# Patient Record
Sex: Female | Born: 1985 | Race: Black or African American | Hispanic: No | Marital: Single | Smoking: Current every day smoker
Health system: Southern US, Community
[De-identification: ages and names within clinical notes are randomized; demographics above are authoritative.]

## PROBLEM LIST (undated history)

## (undated) ENCOUNTER — Emergency Department (HOSPITAL_COMMUNITY): Discharge status: Absent without leave | Payer: Medicare Other

## (undated) DIAGNOSIS — IMO0002 Reserved for concepts with insufficient information to code with codable children: Secondary | ICD-10-CM

## (undated) DIAGNOSIS — I272 Pulmonary hypertension, unspecified: Secondary | ICD-10-CM

## (undated) DIAGNOSIS — G43709 Chronic migraine without aura, not intractable, without status migrainosus: Secondary | ICD-10-CM

## (undated) DIAGNOSIS — M779 Enthesopathy, unspecified: Secondary | ICD-10-CM

## (undated) DIAGNOSIS — K219 Gastro-esophageal reflux disease without esophagitis: Secondary | ICD-10-CM

## (undated) DIAGNOSIS — M1990 Unspecified osteoarthritis, unspecified site: Secondary | ICD-10-CM

## (undated) DIAGNOSIS — D571 Sickle-cell disease without crisis: Secondary | ICD-10-CM

## (undated) DIAGNOSIS — Z9049 Acquired absence of other specified parts of digestive tract: Secondary | ICD-10-CM

## (undated) HISTORY — DX: Enthesopathy, unspecified: M77.9

## (undated) HISTORY — DX: Gastro-esophageal reflux disease without esophagitis: K21.9

## (undated) HISTORY — PX: CHOLECYSTECTOMY: SHX55

## (undated) HISTORY — DX: Reserved for concepts with insufficient information to code with codable children: IMO0002

## (undated) HISTORY — PX: DILATION AND CURETTAGE OF UTERUS: SHX78

## (undated) HISTORY — PX: GALLBLADDER SURGERY: SHX652

## (undated) HISTORY — DX: Pulmonary hypertension, unspecified: I27.20

---
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
## 2010-12-08 DIAGNOSIS — IMO0001 Reserved for inherently not codable concepts without codable children: Secondary | ICD-10-CM | POA: Insufficient documentation

## 2012-07-07 DIAGNOSIS — Z720 Tobacco use: Secondary | ICD-10-CM | POA: Insufficient documentation

## 2013-11-20 DIAGNOSIS — Z9049 Acquired absence of other specified parts of digestive tract: Secondary | ICD-10-CM

## 2013-11-20 HISTORY — DX: Acquired absence of other specified parts of digestive tract: Z90.49

## 2016-06-02 DIAGNOSIS — Z7189 Other specified counseling: Secondary | ICD-10-CM | POA: Insufficient documentation

## 2017-03-10 DIAGNOSIS — F39 Unspecified mood [affective] disorder: Secondary | ICD-10-CM | POA: Insufficient documentation

## 2017-04-28 DIAGNOSIS — Z659 Problem related to unspecified psychosocial circumstances: Secondary | ICD-10-CM | POA: Insufficient documentation

## 2017-10-27 ENCOUNTER — Other Ambulatory Visit: Payer: Self-pay

## 2017-10-27 ENCOUNTER — Emergency Department
Admission: EM | Admit: 2017-10-27 | Discharge: 2017-10-27 | Disposition: A | Discharge status: Home / Self Care | Payer: Self-pay | Attending: Emergency Medicine | Admitting: Emergency Medicine

## 2017-10-27 DIAGNOSIS — M79602 Pain in left arm: Secondary | ICD-10-CM | POA: Insufficient documentation

## 2017-10-27 DIAGNOSIS — F17200 Nicotine dependence, unspecified, uncomplicated: Secondary | ICD-10-CM | POA: Insufficient documentation

## 2017-10-27 DIAGNOSIS — D5700 Hb-SS disease with crisis, unspecified: Secondary | ICD-10-CM | POA: Insufficient documentation

## 2017-10-27 LAB — COMPREHENSIVE METABOLIC PANEL
ALBUMIN: 4.6 g/dL (ref 3.5–5.0)
ALT: 16 U/L (ref 14–54)
ANION GAP: 8 (ref 5–15)
AST: 41 U/L (ref 15–41)
Alkaline Phosphatase: 61 U/L (ref 38–126)
BILIRUBIN TOTAL: 5.3 mg/dL — AB (ref 0.3–1.2)
BUN: <5 mg/dL — ABNORMAL LOW (ref 6–20)
CO2: 24 mmol/L (ref 22–32)
Calcium: 9.4 mg/dL (ref 8.9–10.3)
Chloride: 104 mmol/L (ref 101–111)
Creatinine, Ser: 0.62 mg/dL (ref 0.44–1.00)
GFR calc Af Amer: >60 mL/min (ref >60)
Glucose, Bld: 82 mg/dL (ref 65–99)
POTASSIUM: 3.1 mmol/L — AB (ref 3.5–5.1)
Sodium: 136 mmol/L (ref 135–145)
TOTAL PROTEIN: 8.9 g/dL — AB (ref 6.5–8.1)

## 2017-10-27 LAB — CBC WITH DIFFERENTIAL/PLATELET
BASOS ABS: 0.1 10*3/uL (ref 0–0.1)
Basophils Relative: 1 %
Eosinophils Absolute: 0.3 10*3/uL (ref 0–0.7)
Eosinophils Relative: 2 %
HCT: 25.8 % — ABNORMAL LOW (ref 35.0–47.0)
HEMOGLOBIN: 9.4 g/dL — AB (ref 12.0–16.0)
LYMPHS PCT: 41 %
Lymphs Abs: 5.5 10*3/uL — ABNORMAL HIGH (ref 1.0–3.6)
MCH: 36.2 pg — ABNORMAL HIGH (ref 26.0–34.0)
MCHC: 36.3 g/dL — AB (ref 32.0–36.0)
MCV: 99.8 fL (ref 80.0–100.0)
MONOS PCT: 12 %
Monocytes Absolute: 1.6 10*3/uL — ABNORMAL HIGH (ref 0.2–0.9)
Neutro Abs: 5.8 10*3/uL (ref 1.4–6.5)
Neutrophils Relative %: 44 %
Platelets: 330 10*3/uL (ref 150–440)
RBC: 2.58 MIL/uL — AB (ref 3.80–5.20)
RDW: 21.5 % — ABNORMAL HIGH (ref 11.5–14.5)
Smear Review: ADEQUATE
WBC: 13.3 10*3/uL — ABNORMAL HIGH (ref 3.6–11.0)

## 2017-10-27 LAB — RETICULOCYTES
RBC.: 2.58 MIL/uL — AB (ref 3.80–5.20)
RETIC CT PCT: 11.4 % — AB (ref 0.4–3.1)
Retic Count, Absolute: 294.1 10*3/uL — ABNORMAL HIGH (ref 19.0–183.0)

## 2017-10-27 MED ORDER — OXYCODONE HCL 10 MG PO TABS: 10.0000 mg | ORAL_TABLET | Freq: Three times a day (TID) | ORAL | 0 refills | Status: AC | PRN

## 2017-10-27 MED ORDER — OXYCODONE-ACETAMINOPHEN 5-325 MG PO TABS
2.0000 | ORAL_TABLET | Freq: Once | ORAL | Status: AC
  Administered 2017-10-27: 2 via ORAL
  Filled 2017-10-27: qty 2

## 2017-10-27 MED ORDER — ONDANSETRON 4 MG PO TBDP
4.0000 mg | ORAL_TABLET | Freq: Once | ORAL | Status: AC
  Administered 2017-10-27: 4 mg via ORAL
  Filled 2017-10-27: qty 1

## 2017-10-27 MED ORDER — ONDANSETRON HCL 4 MG PO TABS: 4.0000 mg | ORAL_TABLET | Freq: Every day | ORAL | 0 refills | Status: DC | PRN

## 2017-10-27 MED ORDER — HYDROMORPHONE HCL 1 MG/ML IJ SOLN
2.0000 mg | Freq: Once | INTRAMUSCULAR | Status: AC
  Administered 2017-10-27: 2 mg via INTRAMUSCULAR
  Filled 2017-10-27: qty 2

## 2017-10-27 MED ORDER — DEXAMETHASONE 4 MG PO TABS
8.0000 mg | ORAL_TABLET | Freq: Once | ORAL | Status: AC
  Administered 2017-10-27: 8 mg via ORAL
  Filled 2017-10-27 (×2): qty 2

## 2017-10-27 NOTE — ED Triage Notes (Signed)
She arrives today with reports of sickle cell pain - last crisis was in July  She reports that she has been feeling the pain getting worse since Monday after traveling last week   She has been taking BC powders to help relieve the pain

## 2017-10-27 NOTE — Discharge Instructions (Addendum)
Please take your pain medication as needed for severe symptoms and establish care with both primary care and hematology within the next week for reevaluation.  Return to the emergency department sooner for any concerns whatsoever.  It was a pleasure to take care of you today, and thank you for coming to our emergency department.  If you have any questions or concerns before leaving please ask the nurse to grab me and I'm more than happy to go through your aftercare instructions again.  If you were prescribed any opioid pain medication today such as Norco, Vicodin, Percocet, morphine, hydrocodone, or oxycodone please make sure you do not drive when you are taking this medication as it can alter your ability to drive safely.  If you have any concerns once you are home that you are not improving or are in fact getting worse before you can make it to your follow-up appointment, please do not hesitate to call 911 and come back for further evaluation.  Merrily BrittleNeil Halsey Persaud, MD  Results for orders placed or performed during the hospital encounter of 10/27/17  Comprehensive metabolic panel  Result Value Ref Range   Sodium 136 135 - 145 mmol/L   Potassium 3.1 (L) 3.5 - 5.1 mmol/L   Chloride 104 101 - 111 mmol/L   CO2 24 22 - 32 mmol/L   Glucose, Bld 82 65 - 99 mg/dL   BUN <5 (L) 6 - 20 mg/dL   Creatinine, Ser 1.610.62 0.44 - 1.00 mg/dL   Calcium 9.4 8.9 - 09.610.3 mg/dL   Total Protein 8.9 (H) 6.5 - 8.1 g/dL   Albumin 4.6 3.5 - 5.0 g/dL   AST 41 15 - 41 U/L   ALT 16 14 - 54 U/L   Alkaline Phosphatase 61 38 - 126 U/L   Total Bilirubin 5.3 (H) 0.3 - 1.2 mg/dL   GFR calc non Af Amer >60 >60 mL/min   GFR calc Af Amer >60 >60 mL/min   Anion gap 8 5 - 15  CBC with Differential  Result Value Ref Range   WBC 13.3 (H) 3.6 - 11.0 K/uL   RBC 2.58 (L) 3.80 - 5.20 MIL/uL   Hemoglobin 9.4 (L) 12.0 - 16.0 g/dL   HCT 04.525.8 (L) 40.935.0 - 81.147.0 %   MCV 99.8 80.0 - 100.0 fL   MCH 36.2 (H) 26.0 - 34.0 pg   MCHC 36.3 (H) 32.0  - 36.0 g/dL   RDW 91.421.5 (H) 78.211.5 - 95.614.5 %   Platelets 330 150 - 440 K/uL   Neutrophils Relative % 44 %   Lymphocytes Relative 41 %   Monocytes Relative 12 %   Eosinophils Relative 2 %   Basophils Relative 1 %   Neutro Abs 5.8 1.4 - 6.5 K/uL   Lymphs Abs 5.5 (H) 1.0 - 3.6 K/uL   Monocytes Absolute 1.6 (H) 0.2 - 0.9 K/uL   Eosinophils Absolute 0.3 0 - 0.7 K/uL   Basophils Absolute 0.1 0 - 0.1 K/uL   RBC Morphology MARKED POLYCHROMASIA    Smear Review      PLATELET CLUMPS NOTED ON SMEAR, COUNT APPEARS ADEQUATE  Reticulocytes  Result Value Ref Range   Retic Ct Pct 11.4 (H) 0.4 - 3.1 %   RBC. 2.58 (L) 3.80 - 5.20 MIL/uL   Retic Count, Absolute 294.1 (H) 19.0 - 183.0 K/uL

## 2017-10-27 NOTE — ED Notes (Signed)
Pt discgharged. Pt says her pain is back to a 6. Pt is rubbing the end of her nose and her sats are down when she sleeps. Pt asking for more pain meds. Told pt she would not receive more meds. RX given. Pt asking why she didn't get an IV and fluids and her RX filled by the hospital. Told pt that her labs were stable and that we didn't fill RX. Pt states that in [place] because she is on assistance, she doesn't have to pay for RX. I told pt I was unaware of this hospital doing it but possibly a state hospital. Pt verbalized understanding.

## 2017-10-27 NOTE — ED Provider Notes (Signed)
[facility] Emergency Department Provider Note  ____________________________________________   First MD Initiated Contact with Patient 10/27/17 1243     (approximate)  I have reviewed the triage vital signs and the nursing notes.   HISTORY  Chief Complaint Sickle Cell Pain Crisis   HPI Kara [name] is a 31 y.o. female who self presents to the emergency department with 4-5 days of moderate to severe aching in her elbows wrists hands and legs.  She says this feels identical to previous sickle cell pain crises.  She has had no fevers or chills.  No chest pain or shortness of breath.  She has been compliant with her folic acid, however she is out of all opioid pain medications.  She has been taking BC powder with minimal relief.  Her pain had insidious in onset and has been constant ever since.  It seems to be worsened with movement and improves slightly with pain medication.  [place] recently [place] from [place] and has yet to establish care with a primary care physician or hematologist here in [place].  No past medical history on file.  There are no active problems to display for this patient.     Prior to Admission medications   Medication Sig Start Date End Date Taking? Authorizing Provider  ondansetron (ZOFRAN) 4 MG tablet Take 1 tablet (4 mg total) by mouth daily as needed. 10/27/17 10/27/18  Merrily Brittleifenbark, Deandre Brannan, MD  Oxycodone HCl 10 MG TABS Take 1 tablet (10 mg total) by mouth every 8 (eight) hours as needed. 10/27/17 11/25/17  Merrily Brittleifenbark, Sharran Caratachea, MD    Allergies Kiwi extract; Morphine and related; and Paroxetine  No family history on file.  Social History Social History   Tobacco Use  . Smoking status: Current Every Day Smoker  . Smokeless tobacco: Never Used  Substance Use Topics  . Alcohol use: No    Frequency: Never  . Drug use: Not on file    Review of Systems Constitutional: No fever/chills Eyes: No visual changes. ENT: No  sore throat. Cardiovascular: Denies chest pain. Respiratory: Denies shortness of breath. Gastrointestinal: No abdominal pain.  No nausea, no vomiting.  No diarrhea.  No constipation. Genitourinary: Negative for dysuria. Musculoskeletal: Positive for back pain. Skin: Negative for rash. Neurological: Negative for headaches, focal weakness or numbness.   ____________________________________________   PHYSICAL EXAM:  VITAL SIGNS: ED Triage Vitals  Enc Vitals Group     BP 10/27/17 1145 127/68     Pulse Rate 10/27/17 1145 91     Resp 10/27/17 1145 16     Temp 10/27/17 1145 98.7 F (37.1 C)     Temp Source 10/27/17 1145 Oral     SpO2 10/27/17 1145 98 %     Weight 10/27/17 1146 115 lb (52.2 kg)     Height 10/27/17 1146 5\' 4"  (1.626 m)     Head Circumference --      Peak Flow --      Pain Score 10/27/17 1145 7     Pain Loc --      Pain Edu? --      Excl. in GC? --     Constitutional: Alert and oriented x4 very well-appearing texting when I walk into the room in no acute distress Eyes: PERRL EOMI. Head: Atraumatic. Nose: No congestion/rhinnorhea. Mouth/Throat: No trismus Neck: No stridor.   Cardiovascular: Normal rate, regular rhythm. Grossly normal heart sounds.  Good peripheral circulation. Respiratory: Normal respiratory effort.  No retractions. Lungs CTAB  and moving good air Gastrointestinal: Soft nontender Musculoskeletal: No lower extremity edema   Neurologic:  Normal speech and language. No gross focal neurologic deficits are appreciated. Skin:  Skin is warm, dry and intact. No rash noted. Psychiatric: Mood and affect are normal. Speech and behavior are normal.    ____________________________________________   DIFFERENTIAL includes but not limited to  Sickle cell pain crisis, acute chest syndrome, sequestration crisis ____________________________________________   LABS (all labs ordered are listed, but only abnormal results are displayed)  Labs Reviewed    COMPREHENSIVE METABOLIC PANEL - Abnormal; Notable for the following components:      Result Value   Potassium 3.1 (*)    BUN <5 (*)    Total Protein 8.9 (*)    Total Bilirubin 5.3 (*)    All other components within normal limits  CBC WITH DIFFERENTIAL/PLATELET - Abnormal; Notable for the following components:   WBC 13.3 (*)    RBC 2.58 (*)    Hemoglobin 9.4 (*)    HCT 25.8 (*)    MCH 36.2 (*)    MCHC 36.3 (*)    RDW 21.5 (*)    Lymphs Abs 5.5 (*)    Monocytes Absolute 1.6 (*)    All other components within normal limits  RETICULOCYTES - Abnormal; Notable for the following components:   Retic Ct Pct 11.4 (*)    RBC. 2.58 (*)    Retic Count, Absolute 294.1 (*)    All other components within normal limits  POC URINE PREG, ED    Blood work reviewed by me shows hemoglobin of 9.4 with appropriate reticulocytes.  Elevated white count is nonspecific and likely secondary to stress __________________________________________  EKG   ____________________________________________  RADIOLOGY   ____________________________________________   PROCEDURES  Procedure(s) performed: no  Procedures  Critical Care performed: no  Observation: no ____________________________________________   INITIAL IMPRESSION / ASSESSMENT AND PLAN / ED COURSE  Pertinent labs & imaging results that were available during my care of the patient were reviewed by me and considered in my medical decision making (see chart for details).  On arrival the patient is quite well-appearing, although with a history of sickle cell disease and reported pain which is concerning.  She has no chest pain and no shortness of breath whatsoever.  I had a lengthy discussion with the patient regarding IV fluids and IV pain medication versus oral and she is opted for oral treatment as well as an intramuscular dose of Dilaudid now which I think is reasonable.  ----------------------------------------- 3:22 PM on  10/27/2017 -----------------------------------------  After 2 mg of intramuscular Dilaudid and 10 mg of Percocet the patient's pain is not gone but it is significantly improved.  She is able to eat and drink without difficulty.  At this point I will help her establish care with hematology [place] and give her Percocet for breakthrough pain.  She is discharged home in improved condition verbalizes understanding and agreement the plan.      ____________________________________________   FINAL CLINICAL IMPRESSION(S) / ED DIAGNOSES  Final diagnoses:  Sickle cell pain crisis (HCC)      NEW MEDICATIONS STARTED DURING THIS VISIT:  This SmartLink is deprecated. Use AVSMEDLIST instead to display the medication list for a patient.   Note:  This document was prepared using Dragon voice recognition software and may include unintentional dictation errors.     Merrily Brittleifenbark, Cosimo Schertzer, MD 10/27/17 [phone]

## 2017-10-27 NOTE — ED Notes (Signed)
Pt given sandwich tray and ginger ale. Pt states that she is now itching and that she is unable to take benadryl and needs steroids for itching. Pt also states that she is still having pain, has improved since she first got here but she is still hurting. Pt also requesting to know if we are going to start an IV. Dr. Lamont Snowballifenbark informed and will update patient.

## 2017-10-29 ENCOUNTER — Emergency Department: Payer: Self-pay

## 2017-10-29 ENCOUNTER — Encounter: Payer: Self-pay | Admitting: Emergency Medicine

## 2017-10-29 ENCOUNTER — Other Ambulatory Visit: Payer: Self-pay

## 2017-10-29 ENCOUNTER — Inpatient Hospital Stay
Admission: EM | Admit: 2017-10-29 | Discharge: 2017-10-29 | DRG: 812 | Discharge status: Against Medical Advice | Payer: Self-pay | Attending: Internal Medicine | Admitting: Internal Medicine

## 2017-10-29 DIAGNOSIS — D72829 Elevated white blood cell count, unspecified: Secondary | ICD-10-CM

## 2017-10-29 DIAGNOSIS — D5700 Hb-SS disease with crisis, unspecified: Principal | ICD-10-CM | POA: Diagnosis present

## 2017-10-29 DIAGNOSIS — F17210 Nicotine dependence, cigarettes, uncomplicated: Secondary | ICD-10-CM | POA: Diagnosis present

## 2017-10-29 DIAGNOSIS — Z9114 Patient's other noncompliance with medication regimen: Secondary | ICD-10-CM

## 2017-10-29 HISTORY — DX: Sickle-cell disease without crisis: D57.1

## 2017-10-29 HISTORY — DX: Unspecified osteoarthritis, unspecified site: M19.90

## 2017-10-29 LAB — CBC WITH DIFFERENTIAL/PLATELET
Band Neutrophils: 0 %
Basophils Absolute: 0 K/uL (ref 0–0.1)
Basophils Relative: 0 %
Blasts: 0 %
Eosinophils Absolute: 0 K/uL (ref 0–0.7)
Eosinophils Relative: 0 %
HCT: 22.5 % — ABNORMAL LOW (ref 35.0–47.0)
Hemoglobin: 7.9 g/dL — ABNORMAL LOW (ref 12.0–16.0)
Lymphocytes Relative: 43 %
Lymphs Abs: 9.8 K/uL — ABNORMAL HIGH (ref 1.0–3.6)
MCH: 36.4 pg — ABNORMAL HIGH (ref 26.0–34.0)
MCHC: 35.3 g/dL (ref 32.0–36.0)
MCV: 103 fL — ABNORMAL HIGH (ref 80.0–100.0)
Metamyelocytes Relative: 0 %
Monocytes Absolute: 1.4 K/uL — ABNORMAL HIGH (ref 0.2–0.9)
Monocytes Relative: 6 %
Myelocytes: 0 %
Neutro Abs: 11.6 K/uL — ABNORMAL HIGH (ref 1.4–6.5)
Neutrophils Relative %: 51 %
Other: 0 %
Platelets: 316 K/uL (ref 150–440)
Promyelocytes Absolute: 0 %
RBC: 2.18 MIL/uL — ABNORMAL LOW (ref 3.80–5.20)
RDW: 20.9 % — ABNORMAL HIGH (ref 11.5–14.5)
WBC: 22.8 K/uL — ABNORMAL HIGH (ref 3.6–11.0)
nRBC: 5 /100{WBCs} — ABNORMAL HIGH

## 2017-10-29 LAB — RETICULOCYTES
RBC.: 2.18 MIL/uL — AB (ref 3.80–5.20)
RETIC COUNT ABSOLUTE: 320.5 10*3/uL — AB (ref 19.0–183.0)
RETIC CT PCT: 14.7 % — AB (ref 0.4–3.1)

## 2017-10-29 LAB — COMPREHENSIVE METABOLIC PANEL WITH GFR
ALT: 14 U/L (ref 14–54)
AST: 39 U/L (ref 15–41)
Albumin: 3.9 g/dL (ref 3.5–5.0)
Alkaline Phosphatase: 52 U/L (ref 38–126)
Anion gap: 8 (ref 5–15)
BUN: 7 mg/dL (ref 6–20)
CO2: 25 mmol/L (ref 22–32)
Calcium: 8.9 mg/dL (ref 8.9–10.3)
Chloride: 105 mmol/L (ref 101–111)
Creatinine, Ser: 0.53 mg/dL (ref 0.44–1.00)
GFR calc Af Amer: >60 mL/min (ref >60)
GFR calc non Af Amer: >60 mL/min (ref >60)
Glucose, Bld: 102 mg/dL — ABNORMAL HIGH (ref 65–99)
Potassium: 3.5 mmol/L (ref 3.5–5.1)
Sodium: 138 mmol/L (ref 135–145)
Total Bilirubin: 4.4 mg/dL — ABNORMAL HIGH (ref 0.3–1.2)
Total Protein: 7.4 g/dL (ref 6.5–8.1)

## 2017-10-29 LAB — LACTIC ACID, PLASMA: Lactic Acid, Venous: 1.7 mmol/L (ref 0.5–1.9)

## 2017-10-29 MED ORDER — HYDROMORPHONE HCL 1 MG/ML IJ SOLN: 1.0000 mg | Freq: Once | INTRAMUSCULAR | Status: DC

## 2017-10-29 MED ORDER — KETOROLAC TROMETHAMINE 60 MG/2ML IM SOLN
60.0000 mg | Freq: Once | INTRAMUSCULAR | Status: AC
  Administered 2017-10-29: 60 mg via INTRAMUSCULAR
  Filled 2017-10-29: qty 2

## 2017-10-29 MED ORDER — SODIUM CHLORIDE 0.9 % IV BOLUS (SEPSIS)
1000.0000 mL | Freq: Once | INTRAVENOUS | Status: AC
  Administered 2017-10-29: 1000 mL via INTRAVENOUS

## 2017-10-29 MED ORDER — CEFTRIAXONE SODIUM IN DEXTROSE 20 MG/ML IV SOLN
1.0000 g | Freq: Once | INTRAVENOUS | Status: DC
  Filled 2017-10-29: qty 50

## 2017-10-29 NOTE — ED Triage Notes (Signed)
Pt here for sickle cell crisis. Here for same Friday. Pain mostly to back and right elbow.  Pain not getting any better.  Denies NVD.

## 2017-10-29 NOTE — ED Notes (Signed)
Pt refused rocephin and requested fluids. MD notified and fluids hung

## 2017-10-29 NOTE — ED Notes (Signed)
Pt encouraged to stay and be transferred to [facility] per Dr. Mayford KnifeWilliams advice but pt states "I am going to seek care elsewhere". Pt encouraged to return with any problems, refuses to sign AMA form, complications of refusing transfer and medical care reviewed with patient.

## 2017-10-29 NOTE — ED Notes (Signed)
Pt up to the restroom. Pt was walking out of room when RN saw pt. Pt was leaning in the doorframe and hunched over, RN took pt straight to bathroom and was not able to get urine cup to catch specimen. Will attempt to cath urine sample next time. Pt was taken back to bed using a wheelchair. Pt did not fall and did not sustain injury. Pt instructed by RN to use call bell next time to call for assistance.

## 2017-10-29 NOTE — ED Notes (Signed)
Pt given water and informed of needing urine sample

## 2017-10-29 NOTE — ED Notes (Signed)
98.8 rechecked temp

## 2017-10-29 NOTE — ED Notes (Addendum)
Pt pulled out IV and is now refusing to be transferred to [facility]. Pt dressed and is refusing to sign AMA form with Raquel. [employer] officer outside of room. Pt leaving at this time

## 2017-10-29 NOTE — ED Notes (Signed)
Pt requesting medical records. MD will be notified

## 2017-10-29 NOTE — ED Provider Notes (Signed)
Also to note, patient was video recording me against my wishes as I was trying to discuss her care.  We have reiterated the hospital policy regarding video surveillance.   Emily FilbertWilliams, Kennard Fildes E, MD 10/29/17 [phone]

## 2017-10-29 NOTE — ED Provider Notes (Signed)
I have informed the patient that she was accepted in transfer to [facility] but she has refused all treatment and transport despite my attempts to convey that she may have a serious infection concurrently. She repeatedly refuses further care.   Emily FilbertWilliams, Airam Heidecker E, MD 10/29/17 2056

## 2017-10-29 NOTE — ED Provider Notes (Signed)
[facility] Emergency Department Provider Note       Time seen: ----------------------------------------- 5:54 PM on 10/29/2017 -----------------------------------------   I have reviewed the triage vital signs and the nursing notes.  HISTORY   Chief Complaint Sickle Cell Pain Crisis   HPI Kara [name] is a 31 y.o. female with a history of sickle cell who presents to the ED for diffuse pain.  Patient discussed pain in her back and legs which is typical for her sickle cell pain crisis.  She states she had the same symptoms on Friday.  Pain is mostly in her back and in her right elbow.  She reports the Percocet she was given the other day has not helped her pain in knee.  She denies any other symptoms at this time.  Past Medical History:  Diagnosis Date  . Arthritis   . Sickle cell anemia (HCC)     There are no active problems to display for this patient.   History reviewed. No pertinent surgical history.  Allergies Kiwi extract; Morphine and related; and Paroxetine  Social History Social History   Tobacco Use  . Smoking status: Current Every Day Smoker  . Smokeless tobacco: Never Used  Substance Use Topics  . Alcohol use: No    Frequency: Never  . Drug use: Not on file    Review of Systems Constitutional: Negative for fever. Eyes: Negative for vision changes ENT:  Negative for congestion, sore throat Cardiovascular: Negative for chest pain. Respiratory: Negative for shortness of breath. Gastrointestinal: Negative for abdominal pain, vomiting and diarrhea. Genitourinary: Negative for dysuria. Musculoskeletal: Positive for back pain, right elbow pain Skin: Negative for rash. Neurological: Negative for headaches, focal weakness or numbness.  All systems negative/normal/unremarkable except as stated in the HPI  ____________________________________________   PHYSICAL EXAM:  VITAL SIGNS: ED Triage Vitals  Enc Vitals Group     BP  10/29/17 1713 119/79     Pulse Rate 10/29/17 1713 (!) 115     Resp 10/29/17 1713 (!) 26     Temp 10/29/17 1713 99.9 F (37.7 C)     Temp Source 10/29/17 1713 Oral     SpO2 10/29/17 1713 92 %     Weight 10/29/17 1714 115 lb (52.2 kg)     Height 10/29/17 1714 5\' 4"  (1.626 m)     Head Circumference --      Peak Flow --      Pain Score 10/29/17 1713 10     Pain Loc --      Pain Edu? --      Excl. in GC? --    Constitutional: Alert and oriented. Well appearing and in no distress. Eyes: Conjunctivae are normal. Normal extraocular movements. ENT   Head: Normocephalic and atraumatic.   Nose: No congestion/rhinnorhea.   Mouth/Throat: Mucous membranes are moist.   Neck: No stridor. Cardiovascular: Normal rate, regular rhythm. No murmurs, rubs, or gallops. Respiratory: Normal respiratory effort without tachypnea nor retractions. Breath sounds are clear and equal bilaterally. No wheezes/rales/rhonchi. Gastrointestinal: Soft and nontender. Normal bowel sounds Musculoskeletal: Nontender with normal range of motion in extremities. No lower extremity tenderness nor edema. Neurologic:  Normal speech and language. No gross focal neurologic deficits are appreciated.  Skin:  Skin is warm, dry and intact. No rash noted. Psychiatric: Mood and affect are normal. Speech and behavior are normal.  ____________________________________________  ED COURSE:  Pertinent labs & imaging results that were available during my care of the patient were reviewed by me  and considered in my medical decision making (see chart for details). Patient presents for sickle cell pain crisis, we will assess with labs and imaging as indicated. Clinical Course as of Oct 29 2025  Mon Oct 29, 2017  1806 Patient has been demeaning and verbally abusive toward staff throughout her stay here.  She refuses to apologize to a nurse who was crying after the verbal abuse received while attempting to place an IV on her.  [JW]   1857 Patient has refused blood cultures  [JW]  1912 Patient refused to use the toilet in the room to provide a urine sample  [JW]    Clinical Course User Index [JW] Emily FilbertWilliams, Jonathan E, MD   Procedures ____________________________________________   LABS (pertinent positives/negatives)  Labs Reviewed  CBC WITH DIFFERENTIAL/PLATELET - Abnormal; Notable for the following components:      Result Value   WBC 22.8 (*)    RBC 2.18 (*)    Hemoglobin 7.9 (*)    HCT 22.5 (*)    MCV 103.0 (*)    MCH 36.4 (*)    RDW 20.9 (*)    nRBC 5 (*)    Neutro Abs 11.6 (*)    Lymphs Abs 9.8 (*)    Monocytes Absolute 1.4 (*)    All other components within normal limits  COMPREHENSIVE METABOLIC PANEL - Abnormal; Notable for the following components:   Glucose, Bld 102 (*)    Total Bilirubin 4.4 (*)    All other components within normal limits  RETICULOCYTES - Abnormal; Notable for the following components:   Retic Ct Pct 14.7 (*)    RBC. 2.18 (*)    Retic Count, Absolute 320.5 (*)    All other components within normal limits  CULTURE, BLOOD (ROUTINE X 2)  CULTURE, BLOOD (ROUTINE X 2)  LACTIC ACID, PLASMA  URINALYSIS, COMPLETE (UACMP) WITH MICROSCOPIC  LACTIC ACID, PLASMA    RADIOLOGY Images were viewed by me  Chest x-ray IMPRESSION: No confluent consolidation. Normal vasculature. Moderate cardiomegaly. Linear scarring or atelectasis in the bases, accentuated by a shallow inspiration. ____________________________________________  DIFFERENTIAL DIAGNOSIS   Sickle cell pain crisis, occult infection, medication noncompliance,  FINAL ASSESSMENT AND PLAN  Sickle cell disease, leukocytosis   Plan: Patient had presented for diffuse pain with sickle cell disease that is known. Patient's labs were concerning for infection and possibly bacteremia although lactic acid level is negative. Patient's imaging not reveal any acute process.  I have discussed with infectious disease here who is  recommended IV Rocephin.  I will discuss [facility] for admission.   Emily FilbertWilliams, Jonathan E, MD   Note: This note was generated in part or whole with voice recognition software. Voice recognition is usually quite accurate but there are transcription errors that can and very often do occur. I apologize for any typographical errors that were not detected and corrected.     Emily FilbertWilliams, Jonathan E, MD 10/29/17 2040

## 2017-11-03 LAB — CULTURE, BLOOD (ROUTINE X 2)
Culture: NO GROWTH
Culture: NO GROWTH
Special Requests: ADEQUATE
Special Requests: ADEQUATE

## 2018-02-05 ENCOUNTER — Encounter: Payer: Self-pay | Admitting: Emergency Medicine

## 2018-02-05 ENCOUNTER — Other Ambulatory Visit: Payer: Self-pay

## 2018-02-05 ENCOUNTER — Inpatient Hospital Stay
Admission: EM | Admit: 2018-02-05 | Discharge: 2018-02-05 | DRG: 812 | Disposition: A | Discharge status: Other Acute Inpatient Hospital | Payer: Self-pay | Attending: Internal Medicine | Admitting: Internal Medicine

## 2018-02-05 DIAGNOSIS — D5700 Hb-SS disease with crisis, unspecified: Principal | ICD-10-CM | POA: Diagnosis present

## 2018-02-05 DIAGNOSIS — R21 Rash and other nonspecific skin eruption: Secondary | ICD-10-CM | POA: Diagnosis present

## 2018-02-05 DIAGNOSIS — W57XXXA Bitten or stung by nonvenomous insect and other nonvenomous arthropods, initial encounter: Secondary | ICD-10-CM | POA: Diagnosis present

## 2018-02-05 LAB — COMPREHENSIVE METABOLIC PANEL
ALT: 17 U/L (ref 14–54)
AST: 39 U/L (ref 15–41)
Albumin: 4.3 g/dL (ref 3.5–5.0)
Alkaline Phosphatase: 60 U/L (ref 38–126)
Anion gap: 8 (ref 5–15)
BUN: 5 mg/dL — AB (ref 6–20)
CHLORIDE: 107 mmol/L (ref 101–111)
CO2: 22 mmol/L (ref 22–32)
CREATININE: 0.62 mg/dL (ref 0.44–1.00)
Calcium: 9 mg/dL (ref 8.9–10.3)
Glucose, Bld: 99 mg/dL (ref 65–99)
POTASSIUM: 3.5 mmol/L (ref 3.5–5.1)
Sodium: 137 mmol/L (ref 135–145)
Total Bilirubin: 4.8 mg/dL — ABNORMAL HIGH (ref 0.3–1.2)
Total Protein: 8.4 g/dL — ABNORMAL HIGH (ref 6.5–8.1)

## 2018-02-05 LAB — CBC WITH DIFFERENTIAL/PLATELET
BASOS ABS: 0.2 10*3/uL — AB (ref 0–0.1)
BASOS PCT: 1 %
EOS PCT: 2 %
Eosinophils Absolute: 0.4 10*3/uL (ref 0–0.7)
HEMATOCRIT: 23.1 % — AB (ref 35.0–47.0)
HEMOGLOBIN: 8.2 g/dL — AB (ref 12.0–16.0)
LYMPHS ABS: 6.9 10*3/uL — AB (ref 1.0–3.6)
Lymphocytes Relative: 39 %
MCH: 35.9 pg — ABNORMAL HIGH (ref 26.0–34.0)
MCHC: 35.5 g/dL (ref 32.0–36.0)
MCV: 101.1 fL — ABNORMAL HIGH (ref 80.0–100.0)
Monocytes Absolute: 1.6 10*3/uL — ABNORMAL HIGH (ref 0.2–0.9)
Monocytes Relative: 9 %
NEUTROS PCT: 49 %
Neutro Abs: 8.7 10*3/uL — ABNORMAL HIGH (ref 1.4–6.5)
Platelets: 319 10*3/uL (ref 150–440)
RBC: 2.29 MIL/uL — AB (ref 3.80–5.20)
RDW: 21.7 % — AB (ref 11.5–14.5)
WBC: 17.8 10*3/uL — AB (ref 3.6–11.0)
nRBC: 3 /100 WBC — ABNORMAL HIGH

## 2018-02-05 LAB — URINALYSIS, COMPLETE (UACMP) WITH MICROSCOPIC
BILIRUBIN URINE: NEGATIVE
Bacteria, UA: NONE SEEN
GLUCOSE, UA: NEGATIVE mg/dL
HGB URINE DIPSTICK: NEGATIVE
KETONES UR: NEGATIVE mg/dL
LEUKOCYTES UA: NEGATIVE
NITRITE: NEGATIVE
PH: 6 (ref 5.0–8.0)
Protein, ur: NEGATIVE mg/dL
SPECIFIC GRAVITY, URINE: 1.011 (ref 1.005–1.030)

## 2018-02-05 LAB — RETICULOCYTES
RBC.: 2.29 MIL/uL — AB (ref 3.80–5.20)
RETIC COUNT ABSOLUTE: 274.8 10*3/uL — AB (ref 19.0–183.0)
Retic Ct Pct: 12 % — ABNORMAL HIGH (ref 0.4–3.1)

## 2018-02-05 LAB — POCT PREGNANCY, URINE: Preg Test, Ur: NEGATIVE

## 2018-02-05 MED ORDER — HYDROMORPHONE HCL 1 MG/ML IJ SOLN
1.0000 mg | Freq: Once | INTRAMUSCULAR | Status: AC
  Administered 2018-02-05: 1 mg via INTRAVENOUS
  Filled 2018-02-05: qty 1

## 2018-02-05 MED ORDER — METHYLPREDNISOLONE SODIUM SUCC 125 MG IJ SOLR
60.0000 mg | Freq: Once | INTRAMUSCULAR | Status: AC
  Administered 2018-02-05: 60 mg via INTRAVENOUS
  Filled 2018-02-05: qty 2

## 2018-02-05 MED ORDER — ONDANSETRON HCL 4 MG/2ML IJ SOLN
INTRAMUSCULAR | Status: AC
  Administered 2018-02-05: 4 mg via INTRAVENOUS
  Filled 2018-02-05: qty 2

## 2018-02-05 MED ORDER — DIPHENHYDRAMINE HCL 25 MG PO CAPS
50.0000 mg | ORAL_CAPSULE | Freq: Once | ORAL | Status: AC
  Administered 2018-02-05: 50 mg via ORAL
  Filled 2018-02-05: qty 2

## 2018-02-05 MED ORDER — SODIUM CHLORIDE 0.9 % IV BOLUS (SEPSIS)
1000.0000 mL | Freq: Once | INTRAVENOUS | Status: AC
  Administered 2018-02-05: 1000 mL via INTRAVENOUS

## 2018-02-05 MED ORDER — HYDROMORPHONE HCL 1 MG/ML IJ SOLN
2.0000 mg | Freq: Once | INTRAMUSCULAR | Status: AC
  Administered 2018-02-05: 2 mg via INTRAVENOUS
  Filled 2018-02-05: qty 2

## 2018-02-05 MED ORDER — ONDANSETRON HCL 4 MG/2ML IJ SOLN
4.0000 mg | Freq: Once | INTRAMUSCULAR | Status: AC
  Administered 2018-02-05: 4 mg via INTRAVENOUS
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
## 2018-02-05 NOTE — ED Notes (Signed)
[facility]

## 2018-02-05 NOTE — ED Notes (Signed)
EMTALA checked for completion  

## 2018-02-05 NOTE — ED Notes (Signed)
O2 at 2L per pts comfort

## 2018-02-05 NOTE — ED Triage Notes (Signed)
Pt to ED via POV c/o sickle cell pain crisis and possible bed bug bites. Pt states that for the past week she has been having pain. Pt in NAD in triage.

## 2018-02-06 ENCOUNTER — Encounter (HOSPITAL_COMMUNITY): Payer: Self-pay

## 2018-02-06 ENCOUNTER — Inpatient Hospital Stay (HOSPITAL_COMMUNITY)
Admission: EM | Admit: 2018-02-06 | Discharge: 2018-02-09 | DRG: 812 | Disposition: A | Discharge status: Home / Self Care | Payer: Self-pay | Source: Other Acute Inpatient Hospital | Attending: Internal Medicine | Admitting: Internal Medicine

## 2018-02-06 DIAGNOSIS — Z590 Homelessness: Secondary | ICD-10-CM

## 2018-02-06 DIAGNOSIS — Z9102 Food additives allergy status: Secondary | ICD-10-CM

## 2018-02-06 DIAGNOSIS — Z888 Allergy status to other drugs, medicaments and biological substances status: Secondary | ICD-10-CM

## 2018-02-06 DIAGNOSIS — D638 Anemia in other chronic diseases classified elsewhere: Secondary | ICD-10-CM | POA: Diagnosis present

## 2018-02-06 DIAGNOSIS — M1990 Unspecified osteoarthritis, unspecified site: Secondary | ICD-10-CM | POA: Diagnosis present

## 2018-02-06 DIAGNOSIS — D72829 Elevated white blood cell count, unspecified: Secondary | ICD-10-CM | POA: Diagnosis present

## 2018-02-06 DIAGNOSIS — B851 Pediculosis due to Pediculus humanus corporis: Secondary | ICD-10-CM | POA: Diagnosis present

## 2018-02-06 DIAGNOSIS — F17200 Nicotine dependence, unspecified, uncomplicated: Secondary | ICD-10-CM | POA: Diagnosis present

## 2018-02-06 DIAGNOSIS — Z789 Other specified health status: Secondary | ICD-10-CM

## 2018-02-06 DIAGNOSIS — R071 Chest pain on breathing: Secondary | ICD-10-CM

## 2018-02-06 DIAGNOSIS — Z885 Allergy status to narcotic agent status: Secondary | ICD-10-CM

## 2018-02-06 DIAGNOSIS — D5700 Hb-SS disease with crisis, unspecified: Principal | ICD-10-CM

## 2018-02-06 LAB — CBC WITH DIFFERENTIAL/PLATELET
BASOS ABS: 0 10*3/uL (ref 0.0–0.1)
Basophils Relative: 0 %
Eosinophils Absolute: 0 10*3/uL (ref 0.0–0.7)
Eosinophils Relative: 0 %
HEMATOCRIT: 20.1 % — AB (ref 36.0–46.0)
HEMOGLOBIN: 7.2 g/dL — AB (ref 12.0–15.0)
LYMPHS PCT: 20 %
Lymphs Abs: 2.8 10*3/uL (ref 0.7–4.0)
MCH: 34.8 pg — ABNORMAL HIGH (ref 26.0–34.0)
MCHC: 35.8 g/dL (ref 30.0–36.0)
MCV: 97.1 fL (ref 78.0–100.0)
Monocytes Absolute: 0.1 10*3/uL (ref 0.1–1.0)
Monocytes Relative: 1 %
NEUTROS ABS: 11.2 10*3/uL (ref 1.7–7.7)
NEUTROS PCT: 79 %
Platelets: 320 10*3/uL (ref 150–400)
RBC: 2.07 MIL/uL — AB (ref 3.87–5.11)
RDW: 21.8 % — ABNORMAL HIGH (ref 11.5–15.5)
WBC: 14.1 10*3/uL — ABNORMAL HIGH (ref 4.0–10.5)

## 2018-02-06 LAB — COMPREHENSIVE METABOLIC PANEL
ALT: 14 U/L (ref 14–54)
AST: 33 U/L (ref 15–41)
Albumin: 4.1 g/dL (ref 3.5–5.0)
Alkaline Phosphatase: 62 U/L (ref 38–126)
Anion gap: 9 (ref 5–15)
BILIRUBIN TOTAL: 4.8 mg/dL — AB (ref 0.3–1.2)
BUN: <5 mg/dL — ABNORMAL LOW (ref 6–20)
CALCIUM: 8.8 mg/dL — AB (ref 8.9–10.3)
CO2: 22 mmol/L (ref 22–32)
Chloride: 105 mmol/L (ref 101–111)
Creatinine, Ser: 0.43 mg/dL — ABNORMAL LOW (ref 0.44–1.00)
GFR calc non Af Amer: >60 mL/min (ref >60)
Glucose, Bld: 157 mg/dL — ABNORMAL HIGH (ref 65–99)
Potassium: 3.6 mmol/L (ref 3.5–5.1)
Sodium: 136 mmol/L (ref 135–145)
TOTAL PROTEIN: 8 g/dL (ref 6.5–8.1)

## 2018-02-06 LAB — HIV ANTIBODY (ROUTINE TESTING W REFLEX): HIV Screen 4th Generation wRfx: NONREACTIVE

## 2018-02-06 MED ORDER — HYDROMORPHONE 1 MG/ML IV SOLN
INTRAVENOUS | Status: DC
  Administered 2018-02-06: 3.5 mg via INTRAVENOUS
  Administered 2018-02-06: 2 mg via INTRAVENOUS
  Administered 2018-02-06: 2.5 mg via INTRAVENOUS
  Administered 2018-02-06: 02:00:00 via INTRAVENOUS
  Administered 2018-02-06 – 2018-02-07 (×2): 1.5 mg via INTRAVENOUS
  Administered 2018-02-07 (×2): 2 mg via INTRAVENOUS
  Administered 2018-02-07 (×2): 1.5 mg via INTRAVENOUS
  Administered 2018-02-08: 0 mg via INTRAVENOUS
  Filled 2018-02-06: qty 25

## 2018-02-06 MED ORDER — SENNOSIDES-DOCUSATE SODIUM 8.6-50 MG PO TABS
1.0000 | ORAL_TABLET | Freq: Two times a day (BID) | ORAL | Status: DC
  Administered 2018-02-06 – 2018-02-07 (×4): 1 via ORAL
  Filled 2018-02-06 (×5): qty 1

## 2018-02-06 MED ORDER — ENOXAPARIN SODIUM 40 MG/0.4ML ~~LOC~~ SOLN
40.0000 mg | SUBCUTANEOUS | Status: DC
  Administered 2018-02-06 – 2018-02-07 (×2): 40 mg via SUBCUTANEOUS
  Filled 2018-02-06 (×3): qty 0.4

## 2018-02-06 MED ORDER — SODIUM CHLORIDE 0.9% FLUSH: 9.0000 mL | INTRAVENOUS | Status: DC | PRN

## 2018-02-06 MED ORDER — DIPHENHYDRAMINE HCL 50 MG/ML IJ SOLN
12.5000 mg | Freq: Four times a day (QID) | INTRAMUSCULAR | Status: DC | PRN
  Filled 2018-02-06: qty 1

## 2018-02-06 MED ORDER — POLYETHYLENE GLYCOL 3350 17 G PO PACK
17.0000 g | PACK | Freq: Every day | ORAL | Status: DC | PRN
  Administered 2018-02-07: 17 g via ORAL
  Filled 2018-02-06: qty 1

## 2018-02-06 MED ORDER — KETOROLAC TROMETHAMINE 30 MG/ML IJ SOLN
30.0000 mg | Freq: Four times a day (QID) | INTRAMUSCULAR | Status: DC
  Administered 2018-02-06 – 2018-02-09 (×10): 30 mg via INTRAVENOUS
  Filled 2018-02-06 (×11): qty 1

## 2018-02-06 MED ORDER — NALOXONE HCL 0.4 MG/ML IJ SOLN: 0.4000 mg | INTRAMUSCULAR | Status: DC | PRN

## 2018-02-06 MED ORDER — DIPHENHYDRAMINE HCL 12.5 MG/5ML PO ELIX: 12.5000 mg | ORAL_SOLUTION | Freq: Four times a day (QID) | ORAL | Status: DC | PRN

## 2018-02-06 MED ORDER — ENSURE ENLIVE PO LIQD
237.0000 mL | Freq: Two times a day (BID) | ORAL | Status: DC
  Administered 2018-02-06 – 2018-02-09 (×4): 237 mL via ORAL

## 2018-02-06 MED ORDER — DEXTROSE-NACL 5-0.45 % IV SOLN
INTRAVENOUS | Status: DC
  Administered 2018-02-06 – 2018-02-07 (×3): via INTRAVENOUS

## 2018-02-06 MED ORDER — ONDANSETRON HCL 4 MG/2ML IJ SOLN
4.0000 mg | Freq: Four times a day (QID) | INTRAMUSCULAR | Status: DC | PRN
  Filled 2018-02-06: qty 2
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
## 2018-02-07 DIAGNOSIS — D72829 Elevated white blood cell count, unspecified: Secondary | ICD-10-CM

## 2018-02-07 MED ORDER — ACETAMINOPHEN 325 MG PO TABS
650.0000 mg | ORAL_TABLET | Freq: Four times a day (QID) | ORAL | Status: DC | PRN
Start: 2018-02-07 — End: 2018-02-09
  Administered 2018-02-07 – 2018-02-08 (×2): 650 mg via ORAL
  Filled 2018-02-07 (×3): qty 2
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
## 2018-02-07 NOTE — Progress Notes (Addendum)
CSW received a call from pt's RN who states pt has a disability appointment tomorrow (3/21), is concerned about missing the appointment and as a result states that:  1. Pt is homeless 2. States she is not suicidal, but "wants to be a DNR, doesn't but doesn't want to live anymore" 3. Pt wants to leave (due to upcoming appt)  But pt understands she will be AMA as explained to her by the pt's RN and instead wants assistance with her disability appointment.  RN stated provider placed a consult for social work and the [place] earlier today.   RN at ph: [phone] can be reached for assistance.  CSW will visit pt and provide SSA # where pt can call and reschedule the appointment.  9:03 PM CS[facility] and provided encouragement to the pt to remain positive as well as provided active listening and validation of the pt's concerns and emotions, regarding her disability claim.  CSW encouraged the pt to call and re-schedule her disability appointment and counseled pt on how to provide her D/C paperwork as proof of her hospital stay and encouraged pt to not give up, that she could advocate for herself  Pt presented initially as angry, but as session progressed pt began to present as melancholy and finally, pt was appreciative and thanked the CSW.  Please reconsult if future social work needs arise.  CSW signing off, as social work intervention is no longer needed.  Dorothe PeaJonathan F. Yasamin Karel, LCSW, LCAS, CSI Clinical Social Worker Ph: [phone]

## 2018-02-07 NOTE — Progress Notes (Signed)
Patient ID: Kara KirksSherry Kosak, female   DOB: 21-Jul-1986, 32 y.o.   MRN: [id number] Subjective:  Patient still having significant pain. She rates her pain at 10/10, all over but more on the lower limbs and lower back. Denies fever, no chest pain, no SOB  Objective:  Vital signs in last 24 hours:  Vitals:   02/07/18 0823 02/07/18 1015 02/07/18 1217 02/07/18 1510  BP:  118/74  117/68  Pulse:  98  (!) 101  Resp: 18 16 18  (!) 21  Temp:  98.7 F (37.1 C)  98.7 F (37.1 C)  TempSrc:  Oral  Oral  SpO2: 98% 98% 95% 97%  Weight:      Height:        Intake/Output from previous day:   Intake/Output Summary (Last 24 hours) at 02/07/2018 1811 Last data filed at 02/07/2018 1700 Gross per 24 hour  Intake 5102.08 ml  Output -  Net 5102.08 ml    Physical Exam: General: Alert, awake, oriented x3, in no acute distress.  HEENT: [place]/AT PEERL, EOMI Neck: Trachea midline,  no masses, no thyromegal,y no JVD, no carotid bruit OROPHARYNX:  Moist, No exudate/ erythema/lesions.  Heart: Regular rate and rhythm, without murmurs, rubs, gallops, PMI non-displaced, no heaves or thrills on palpation.  Lungs: Clear to auscultation, no wheezing or rhonchi noted. No increased vocal fremitus resonant to percussion  Abdomen: Soft, nontender, nondistended, positive bowel sounds, no masses no hepatosplenomegaly noted..  Neuro: No focal neurological deficits noted cranial nerves II through XII grossly intact. DTRs 2+ bilaterally upper and lower extremities. Strength 5 out of 5 in bilateral upper and lower extremities. Musculoskeletal: No warm swelling or erythema around joints, no spinal tenderness noted. Psychiatric: Patient alert and oriented x3, good insight and cognition, good recent to remote recall. Lymph node survey: No cervical axillary or inguinal lymphadenopathy noted.  Lab Results:  Basic Metabolic Panel:    Component Value Date/Time   NA 136 02/06/2018 0147   K 3.6 02/06/2018 0147   CL 105 02/06/2018  0147   CO2 22 02/06/2018 0147   BUN <5 (L) 02/06/2018 0147   CREATININE 0.43 (L) 02/06/2018 0147   GLUCOSE 157 (H) 02/06/2018 0147   CALCIUM 8.8 (L) 02/06/2018 0147   CBC:    Component Value Date/Time   WBC DUP SEE W1082 02/06/2018 0500   HGB DUP SEE W1082 02/06/2018 0500   HCT DUP SEE W1082 02/06/2018 0500   PLT DUP SEE W1082 02/06/2018 0500   MCV DUP SEE W1082 02/06/2018 0500   NEUTROABS PENDING 02/06/2018 0500   LYMPHSABS PENDING 02/06/2018 0500   MONOABS PENDING 02/06/2018 0500   EOSABS PENDING 02/06/2018 0500   BASOSABS PENDING 02/06/2018 0500    No results found for this or any previous visit (from the past 240 hour(s)).  Studies/Results: No results found.  Medications: Scheduled Meds: . enoxaparin (LOVENOX) injection  40 mg Subcutaneous Q24H  . feeding supplement (ENSURE ENLIVE)  237 mL Oral BID BM  . HYDROmorphone   Intravenous Q4H  . ketorolac  30 mg Intravenous Q6H  . senna-docusate  1 tablet Oral BID   Continuous Infusions: . dextrose 5 % and 0.45% NaCl 125 mL/hr at 02/07/18 1703   PRN Meds:.diphenhydrAMINE **OR** diphenhydrAMINE, naloxone **AND** sodium chloride flush, ondansetron (ZOFRAN) IV, polyethylene glycol  Assessment/Plan: Principal Problem:   Sickle cell crisis (HCC) Active Problems:   Leucocytosis   1. Hb SS with Pain Crisis: Patient reports no improvement in her pain, will continue current pain management regimen and  add clinician assisted doses to help with pain relief.  2. Leukocytosis: Reactive. No evidence of infection or inflammation. Continue to monitor 3. Sickle Cell Anemia: Most likely due to vaso-occlusive crisis. Continue to monitor white count 4. Chronic pain: Continue pain management 5. Homelessness: Patient was seen by both LCSW and Chaplain, emotional support provided   Code Status: Full Code Family Communication: N/A Disposition Plan: Not yet ready for discharge  [place]  If 7PM-7AM, please contact  night-coverage.  02/07/2018, 6:11 PM  LOS: 1 day
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
## 2018-02-07 NOTE — Progress Notes (Signed)
   02/07/18 1100  Clinical Encounter Type  Visited With Patient  Visit Type Initial;Psychological support;Spiritual support  Referral From Nurse  Consult/Referral To Chaplain  Spiritual Encounters  Spiritual Needs Emotional;Other (Comment) (Spiritual Care conversation/Support)  Stress Factors  Patient Stress Factors None identified   I visited briefly with the patient per Spiritual Care Consult.  The patient was very tired and did not identify any needs at this time.  Will follow up.   Chaplain Clint BolderBrittany Nichols Corter M.Div., [facility]

## 2018-02-07 NOTE — Progress Notes (Addendum)
Walked into patient's room at 1600 to put patient's O2 back on (PCA monitor was beeping), and patient stated "I don't want it." I explained to patient that her O2 sats were wavering between 82-88% and that she needed her oxygen back on. Patient stated clearly: "I don't want it. I don't want any of it. Let me crash. I want to crash. I don't want to be here anymore." Tried to talk to patient and asked what was wrong (she was visibly upset- tears coming down cheeks and holding her phone in her hand texting with someone). She did not want to talk with me about what was bothering her. I reassured her that I cared and that I was trying to take care of her and ensure that she got the best care and she once again said "I don't want it. I just want to die. I don't want to kill myself and I'm not going to hurt myself, I just want to die."  After once again trying to explain the importance of her oxygen and having her monitors on, she refused. I took everything off and told her that per policy, I would have to turn the PCA off as well. She then proceeded to say "Fine. I'm done. I don't want it anymore. I'm ready to go."  I asked her if she would like to talk to anyone and she refused.  Called MD and made MD aware (MD aware of patient's verbal statements, low O2 sats, patient's refusal to be monitored and patient being off of her PCA)- MD stated to order a  chaplain and SW consult and advise patient that if she were to leave, she would be leaving AMA.  Ordered chaplain and SW consults, will go tell patient about leaving AMA and that the MD does not think she is medically able to be discharged today. Will continue to monitor.

## 2018-02-07 NOTE — Progress Notes (Addendum)
Went back into patient's room at 1640 to inform patient about what it meant to be leaving AMA, and that if she decided to leave this afternoon, she would be leaving AMA. Also informed patient of social work and chaplain consults. Pt was concerned about missing a disability appointment in the morning. Informed patient that she could talk with the social worker about this and patient stated she would wait until social worker came and she would talk to SW. Patient had stopped IV fluids- asked patient if I could restart them while she waited and she agreed. Will check on patient frequently and continue to monitor.

## 2018-02-08 ENCOUNTER — Inpatient Hospital Stay (HOSPITAL_COMMUNITY): Payer: Self-pay

## 2018-02-08 DIAGNOSIS — R071 Chest pain on breathing: Secondary | ICD-10-CM

## 2018-02-08 LAB — COMPREHENSIVE METABOLIC PANEL
ALK PHOS: 54 U/L (ref 38–126)
ALT: 13 U/L — ABNORMAL LOW (ref 14–54)
ANION GAP: 8 (ref 5–15)
AST: 30 U/L (ref 15–41)
Albumin: 3.5 g/dL (ref 3.5–5.0)
BUN: <5 mg/dL — ABNORMAL LOW (ref 6–20)
CO2: 25 mmol/L (ref 22–32)
Calcium: 8.8 mg/dL — ABNORMAL LOW (ref 8.9–10.3)
Chloride: 106 mmol/L (ref 101–111)
Creatinine, Ser: 0.36 mg/dL — ABNORMAL LOW (ref 0.44–1.00)
GFR calc non Af Amer: >60 mL/min (ref >60)
Glucose, Bld: 94 mg/dL (ref 65–99)
POTASSIUM: 3.7 mmol/L (ref 3.5–5.1)
SODIUM: 139 mmol/L (ref 135–145)
TOTAL PROTEIN: 6.9 g/dL (ref 6.5–8.1)
Total Bilirubin: 3.2 mg/dL — ABNORMAL HIGH (ref 0.3–1.2)

## 2018-02-08 LAB — CBC WITH DIFFERENTIAL/PLATELET
Basophils Absolute: 0 10*3/uL (ref 0.0–0.1)
Basophils Relative: 0 %
EOS ABS: 0.4 10*3/uL (ref 0.0–0.7)
Eosinophils Relative: 2 %
HCT: 18.2 % — ABNORMAL LOW (ref 36.0–46.0)
HEMOGLOBIN: 6.2 g/dL — AB (ref 12.0–15.0)
LYMPHS ABS: 8.6 10*3/uL — AB (ref 0.7–4.0)
Lymphocytes Relative: 46 %
MCH: 33.7 pg (ref 26.0–34.0)
MCHC: 34.1 g/dL (ref 30.0–36.0)
MCV: 98.9 fL (ref 78.0–100.0)
MONOS PCT: 8 %
Monocytes Absolute: 1.5 10*3/uL — ABNORMAL HIGH (ref 0.1–1.0)
NEUTROS ABS: 8.3 10*3/uL — AB (ref 1.7–7.7)
NRBC: 4 /100{WBCs} — AB
Neutrophils Relative %: 44 %
Platelets: 323 10*3/uL (ref 150–400)
RBC: 1.84 MIL/uL — AB (ref 3.87–5.11)
RDW: 25.3 % — AB (ref 11.5–15.5)
WBC: 18.8 10*3/uL — AB (ref 4.0–10.5)

## 2018-02-08 LAB — ABO/RH: ABO/RH(D): A POS

## 2018-02-08 LAB — PREPARE RBC (CROSSMATCH)

## 2018-02-08 MED ORDER — SODIUM CHLORIDE 0.9 % IV SOLN
Freq: Once | INTRAVENOUS | Status: AC
  Administered 2018-02-08: 15:00:00 via INTRAVENOUS

## 2018-02-08 NOTE — Progress Notes (Signed)
Patient refusing IV fluids, refusing IV Toradol.  Patient requested Tylenol stating, "I only want Tylenol, I don't want anything else", on call  paged Tylenol ordered and administered.  Patient resting in bed at present time no s/s of distress.

## 2018-02-08 NOTE — Progress Notes (Signed)
CRITICAL VALUE ALERT  Critical Value:  HGB 6.2  Date & Time Notied:  02/08/18 0544  Provider Notified: Rana SnareBodenheimer  Orders Received/Actions taken: made aware via text page

## 2018-02-08 NOTE — Progress Notes (Signed)
   02/08/18 1500  Clinical Encounter Type  Visited With Patient  Visit Type Follow-up;Psychological support;Spiritual support  Referral From Nurse  Consult/Referral To Chaplain  Spiritual Encounters  Spiritual Needs Emotional;Other (Comment) (Spiritual Care Conversation/Support)  Stress Factors  Patient Stress Factors Financial concerns;Health changes;Loss of control;Major life changes   I followed up with Charleen KirksSherry Rovner per Spiritual Care consult.  Ms. Luiz BlareGraves was receptive to my visit, although she assumed that our visit would be "filled with questions" she had already answered. I framed the visit as one for support, and this opened the patient up more.  Ms. Luiz BlareGraves stated that she is "tired," mentally and physically, of dealing with her sickle cell disease. Cordelia PenSherry talked about not having anything to "fight" for. Cordelia PenSherry talked about not having children or a family that could serve as hope for her.  At this time Cordelia PenSherry feels hopeless about the future and talked about her "body giving up." She feels that her life has become meaningless and full of troubles. Cordelia PenSherry recognized that her current financial and sociological issues have had an impact on her health and ability to get better.  Cordelia PenSherry discussed being "closed off" from others due to years of not feeling "heard." She stated that she is tired of people trying to "force her to believe that the future will be better." She feels that people do not acknowledge how difficult her situation is. Cordelia PenSherry is also frustrated that people say, "I understand or know what you are going through."  Cordelia PenSherry feels a lack of agency at this point in her life; and isn't used to asking for help from others.  We discussed things that do help her during difficult times. Cordelia PenSherry is a member of many social media support groups for people with sickle cell, and even shared with me a post that spoke to her.  I provided an encouraging pastoral presence; pastoral counseling;  normalization of Maty's feelings; and encouraged the patient to look for ways that she would be comfortable allowing others to support her.  Cordelia PenSherry is very self-aware about her emotions and boundaries to gaining support.  At this moment, Cordelia PenSherry is most concerned about her living situation. This is the cause of most of her anxiety at the moment.   Please, contact Spiritual Care for further assistance.   Chaplain Clint BolderBrittany Jarrette Dehner M.Div., [facility]

## 2018-02-08 NOTE — Progress Notes (Signed)
Patient ID: Kara [name], female   DOB: [birth year], 32 y.o.   MRN: [id number] Subjective:  Hb dropped more than 2 points since admission, she is complaining of dizziness and fatigue. She apparently asked to be taken off her IVF and PCA saying "its not helping and there is no need, I'll just go through with the pain". She denies any fever, denies any chest pain, no headache. No SOB  Objective:  Vital signs in last 24 hours:  Vitals:   02/07/18 2257 02/08/18 1025 02/08/18 1445 02/08/18 1500  BP:  115/67 110/64 109/63  Pulse:  (!) 106 (!) 102 (!) 104  Resp:  20 18 17   Temp: 100.3 F (37.9 C) 99.6 F (37.6 C) 98.6 F (37 C) 98.6 F (37 C)  TempSrc: Oral Oral Oral Oral  SpO2:  90% 94% 92%  Weight:      Height:        Intake/Output from previous day:   Intake/Output Summary (Last 24 hours) at 02/08/2018 1813 Last data filed at 02/08/2018 1700 Gross per 24 hour  Intake 1281.25 ml  Output -  Net 1281.25 ml    Physical Exam: General: Alert, awake, oriented x3, in no acute distress.  HEENT: [place]/AT PEERL, EOMI Neck: Trachea midline,  no masses, no thyromegal,y no JVD, no carotid bruit OROPHARYNX:  Moist, No exudate/ erythema/lesions.  Heart: Regular rate and rhythm, without murmurs, rubs, gallops, PMI non-displaced, no heaves or thrills on palpation.  Lungs: Clear to auscultation, no wheezing or rhonchi noted. No increased vocal fremitus resonant to percussion  Abdomen: Soft, nontender, nondistended, positive bowel sounds, no masses no hepatosplenomegaly noted..  Neuro: No focal neurological deficits noted cranial nerves II through XII grossly intact. DTRs 2+ bilaterally upper and lower extremities. Strength 5 out of 5 in bilateral upper and lower extremities. Musculoskeletal: No warm swelling or erythema around joints, no spinal tenderness noted. Psychiatric: Patient alert and oriented x3, good insight and cognition, good recent to remote recall. Lymph node survey: No cervical  axillary or inguinal lymphadenopathy noted.  Lab Results:  Basic Metabolic Panel:    Component Value Date/Time   NA 139 02/08/2018 0432   K 3.7 02/08/2018 0432   CL 106 02/08/2018 0432   CO2 25 02/08/2018 0432   BUN <5 (L) 02/08/2018 0432   CREATININE 0.36 (L) 02/08/2018 0432   GLUCOSE 94 02/08/2018 0432   CALCIUM 8.8 (L) 02/08/2018 0432   CBC:    Component Value Date/Time   WBC 18.8 (H) 02/08/2018 0432   HGB 6.2 (LL) 02/08/2018 0432   HCT 18.2 (L) 02/08/2018 0432   PLT 323 02/08/2018 0432   MCV 98.9 02/08/2018 0432   NEUTROABS 8.3 (H) 02/08/2018 0432   LYMPHSABS 8.6 (H) 02/08/2018 0432   MONOABS 1.5 (H) 02/08/2018 0432   EOSABS 0.4 02/08/2018 0432   BASOSABS 0.0 02/08/2018 0432    No results found for this or any previous visit (from the past 240 hour(s)).  Studies/Results: Dg Chest 1 View  Result Date: 02/08/2018 CLINICAL DATA:  Chest pain with breathing. EXAM: CHEST  1 VIEW COMPARISON:  10/29/2017 FINDINGS: Heart size at the upper limits of normal allowing for technique. Mediastinal shadows are normal. Patchy density remains visible in both lower lobes, similar to the study of December. This could represent infiltrate, atelectasis or scarring. Upper lungs are clear. IMPRESSION: Redemonstration of patchy densities at the lung bases, similar to the study of December. This could be mild basilar pneumonia, atelectasis or scarring. Electronically Signed   By:  Paulina FusiMark  Shogry M.D.   On: 02/08/2018 14:29    Medications: Scheduled Meds: . enoxaparin (LOVENOX) injection  40 mg Subcutaneous Q24H  . feeding supplement (ENSURE ENLIVE)  237 mL Oral BID BM  . HYDROmorphone   Intravenous Q4H  . ketorolac  30 mg Intravenous Q6H  . senna-docusate  1 tablet Oral BID   Continuous Infusions: . dextrose 5 % and 0.45% NaCl Stopped (02/07/18 2215)   PRN Meds:.acetaminophen, diphenhydrAMINE **OR** diphenhydrAMINE, naloxone **AND** sodium chloride flush, ondansetron (ZOFRAN) IV, polyethylene  glycol  Assessment/Plan: Principal Problem:   Sickle cell crisis (HCC) Active Problems:   Leucocytosis  1. Hb SS with Pain Crisis: Patient reports slight improvement in her pain, will continue current pain management regimen, patient advised to restart pain management regimen as previously ordered. 2. Leukocytosis: Reactive. No evidence of infection or inflammation. Continue to monitor 3. Sickle Cell Anemia: Most likely due to vaso-occlusive crisis. Hb dropped more than 2 points and patient is symptomatic, will transfuse one unit of PRBC and monitor closely. 4. Chronic pain: Continue pain management 5. Homelessness: Patient was seen by both LCSW and Chaplain, emotional support provided  Code Status: Full Code Family Communication: N/A Disposition Plan: Not yet ready for discharge  Kara [name]  If 7PM-7AM, please contact night-coverage.  02/08/2018, 6:13 PM  LOS: 2 days

## 2018-02-08 NOTE — Progress Notes (Signed)
Pt provided with [facility] bus pass and [place] bus passes to assist with returning to [place] at [place].  Ilean SkillMeghan Anahis Furgeson, MSW, LCSW Clinical Social Work 02/08/2018 [phone]

## 2018-02-09 LAB — CBC WITH DIFFERENTIAL/PLATELET
Band Neutrophils: 1 %
Basophils Absolute: 0 10*3/uL (ref 0.0–0.1)
Basophils Relative: 0 %
Eosinophils Absolute: 0.6 10*3/uL (ref 0.0–0.7)
Eosinophils Relative: 4 %
HCT: 24.1 % — ABNORMAL LOW (ref 36.0–46.0)
Hemoglobin: 8.4 g/dL — ABNORMAL LOW (ref 12.0–15.0)
LYMPHS ABS: 9 10*3/uL — AB (ref 0.7–4.0)
Lymphocytes Relative: 63 %
MCH: 33.9 pg (ref 26.0–34.0)
MCHC: 34.9 g/dL (ref 30.0–36.0)
MCV: 97.2 fL (ref 78.0–100.0)
MONO ABS: 0.6 10*3/uL (ref 0.1–1.0)
MYELOCYTES: 2 %
Metamyelocytes Relative: 2 %
Monocytes Relative: 4 %
NEUTROS ABS: 4.2 10*3/uL (ref 1.7–7.7)
NEUTROS PCT: 24 %
NRBC: 8 /100{WBCs} — AB
Platelets: 367 10*3/uL (ref 150–400)
RBC: 2.48 MIL/uL — ABNORMAL LOW (ref 3.87–5.11)
RDW: 23.7 % — ABNORMAL HIGH (ref 11.5–15.5)
WBC: 14.4 10*3/uL — ABNORMAL HIGH (ref 4.0–10.5)

## 2018-02-09 LAB — TYPE AND SCREEN
ABO/RH(D): A POS
ANTIBODY SCREEN: NEGATIVE
DONOR AG TYPE: NEGATIVE
PT AG TYPE: POSITIVE
Unit division: 0

## 2018-02-09 LAB — BPAM RBC
BLOOD PRODUCT EXPIRATION DATE: 201904032359
ISSUE DATE / TIME: 201903221435
UNIT TYPE AND RH: 5100

## 2018-02-09 NOTE — Progress Notes (Signed)
Patient discharged to home,  All discharge medications and instructions reviewed and questions answered.

## 2018-02-09 NOTE — Discharge Summary (Signed)
Physician Discharge Summary  Patient ID: Kara [name] MRN: [id number] DOB/AGE: 1986/05/29 31 y.o.  Admit date: 02/06/2018 Discharge date: 02/09/2018  Admission Diagnoses:  Discharge Diagnoses:  Principal Problem:   Sickle cell crisis [facility](HCC) Active Problems:   Leucocytosis   Chest pain on breathing   Discharged Condition: good  Hospital Course: Patient admitted with Sickle cell Painful crisis. Has been on Dilaudid PCA, Toradol and IVF. Has anemia of chronic disease and acute hemolytic crisis requiring transfusion of 1 unit PRBC. Hemoglobin improved and patient was discharged home on home regimen. She is homeless, recently moved to [place]. SW involved. Patient had body lice which was treated.  Consults: None  Significant Diagnostic Studies: labs: CBCs and CMPs  Treatments: IV hydration and analgesia: acetaminophen and Vicodin  Discharge Exam: [name] pressure 120/82, pulse 72, temperature 98.4 F (36.9 C), temperature source Oral, resp. rate 12, height 5\' 5"  (1.651 m), weight 57.2 kg (126 lb 1.7 oz), SpO2 99 %. General appearance: alert, cooperative and appears stated age Resp: clear to auscultation bilaterally Cardio: regular rate and rhythm, S1, S2 normal, no murmur, click, rub or gallop GI: soft, non-tender; bowel sounds normal; no masses,  no organomegaly Pulses: 2+ and symmetric Neurologic: Grossly normal  Disposition: Discharge disposition: 01-Home or Self Care       Discharge Instructions    Diet - low sodium heart healthy   Complete by:  As directed    Increase activity slowly   Complete by:  As directed      Allergies as of 02/09/2018      Reactions   Kiwi Extract Hives   Morphine And Related Hives   Paroxetine Hives      Medication List    You have not been prescribed any medications.      SignedLonia [name]: Kara [name],Kara [name] 02/09/2018, 7:56 AM  Time spent 33 minutes

## 2018-02-27 ENCOUNTER — Ambulatory Visit: Payer: Self-pay

## 2018-02-27 ENCOUNTER — Telehealth: Payer: Self-pay

## 2018-02-27 NOTE — Telephone Encounter (Signed)
Lm for pt to call to schedule pcp/counseling appt.

## 2018-03-05 ENCOUNTER — Telehealth: Payer: Self-pay

## 2018-03-05 NOTE — Telephone Encounter (Signed)
Lm to call to call back to schedule pcp/counseling appt.

## 2018-04-04 ENCOUNTER — Encounter: Payer: Self-pay | Admitting: Adult Health

## 2018-04-04 ENCOUNTER — Other Ambulatory Visit: Payer: Self-pay

## 2018-04-04 ENCOUNTER — Ambulatory Visit: Payer: Medicaid Other | Admitting: Adult Health

## 2018-04-04 ENCOUNTER — Ambulatory Visit: Payer: Medicaid Other | Admitting: Licensed Clinical Social Worker

## 2018-04-04 VITALS — BP 105/61 | HR 91 | Temp 100.0°F | Ht 63.0 in | Wt 123.4 lb

## 2018-04-04 DIAGNOSIS — F411 Generalized anxiety disorder: Principal | ICD-10-CM

## 2018-04-04 DIAGNOSIS — I272 Pulmonary hypertension, unspecified: Secondary | ICD-10-CM

## 2018-04-04 DIAGNOSIS — D571 Sickle-cell disease without crisis: Secondary | ICD-10-CM | POA: Insufficient documentation

## 2018-04-04 DIAGNOSIS — Z0000 Encounter for general adult medical examination without abnormal findings: Secondary | ICD-10-CM

## 2018-04-04 DIAGNOSIS — M1990 Unspecified osteoarthritis, unspecified site: Secondary | ICD-10-CM

## 2018-04-04 DIAGNOSIS — F3132 Bipolar disorder, current episode depressed, moderate: Secondary | ICD-10-CM

## 2018-04-04 DIAGNOSIS — F410 Panic disorder [episodic paroxysmal anxiety] without agoraphobia: Secondary | ICD-10-CM

## 2018-04-04 MED ORDER — VITAMIN B-12 100 MCG PO TABS: 100.0000 ug | ORAL_TABLET | Freq: Every day | ORAL | 3 refills | Status: DC

## 2018-04-04 MED ORDER — FERROUS SULFATE 325 (65 FE) MG PO TABS: 325.0000 mg | ORAL_TABLET | Freq: Every day | ORAL | 3 refills | Status: DC

## 2018-04-04 MED ORDER — LORATADINE 10 MG PO TABS: 10.0000 mg | ORAL_TABLET | Freq: Every day | ORAL | 3 refills | Status: DC

## 2018-04-04 MED ORDER — RANITIDINE HCL 75 MG PO TABS: 150.0000 mg | ORAL_TABLET | Freq: Two times a day (BID) | ORAL | 3 refills | Status: DC | PRN

## 2018-04-04 MED ORDER — FOLIC ACID 1 MG PO TABS: 1.0000 mg | ORAL_TABLET | Freq: Every day | ORAL | 3 refills | Status: DC

## 2018-04-04 MED ORDER — NAPROXEN 500 MG PO TABS: 500.0000 mg | ORAL_TABLET | Freq: Three times a day (TID) | ORAL | 3 refills | Status: DC | PRN

## 2018-04-04 MED ORDER — VITAMIN D (ERGOCALCIFEROL) 1.25 MG (50000 UNIT) PO CAPS: 50000.0000 [IU] | ORAL_CAPSULE | ORAL | 3 refills | Status: DC

## 2018-04-04 MED ORDER — PENICILLIN V POTASSIUM 250 MG PO TABS: 500.0000 mg | ORAL_TABLET | Freq: Two times a day (BID) | ORAL | 3 refills | Status: DC

## 2018-04-04 NOTE — Progress Notes (Signed)
Patient ID: Kara [name], female   DOB: September 08, 1986, 32 y.o.   MRN: [id number]  Chief Complaint  Patient presents with  . New Patient (Initial Visit)    sickle cell, pain in rt hip, bilat knee pain    HPI Kara [name] is a 32 y.o. 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 She just moved here from [place]. She was under the care of pain management and a sickle cell specialist.  She is currently c/o pain in her legs and fingers. She took tylenol with moderate relief. She is not currently on any medications for pulmonary hypertension. She is not on any prophylaxis as well SS disease. She was recently admitted at [facility] from 02/06/18-02/09/2018 and treated for sickle cell crisis and chest pain. She received 1 unit of PRBCs and was discharged on no medications She is requesting STI screening because she is planning on getting pregnant soon. She currently has no partner. She reports heavy periods that last about 5 days. No associated cramping.  She reports being up to date on all immunizations except for the flu vaccin. She declined the flu vaccin. She is a current every day smoker and expressed the desire to quit but does not want a nicotine patch.  Past Medical History:  Diagnosis Date  . Arthritis   . Pulmonary hypertension (HCC)   . Sickle cell anemia (HCC)     Past Surgical History:  Procedure Laterality Date  . DILATION AND CURETTAGE OF UTERUS    . GALLBLADDER SURGERY      Family History  Adopted: Yes    Social History Social History   Tobacco Use  . Smoking status: Current Every Day Smoker  . Smokeless tobacco: Never Used  Substance Use Topics  . Alcohol use: No    Frequency: Never  . Drug use: Not on file    Allergies  Allergen Reactions  . Kiwi Extract Hives  . Morphine And Related Hives  . Paroxetine Hives    Current Outpatient Medications  Medication Sig Dispense Refill  . IBUPROFEN  PO Take 500 mg by mouth.    . oxyCODONE (OXYCONTIN) 10 mg 12 hr tablet Take 10 mg by mouth every 12 (twelve) hours.     No current facility-administered medications for this visit.     Review of Systems Review of Systems  Constitutional: Negative.   HENT: Negative.   Eyes: Positive for visual disturbance (uses glasses).  Respiratory: Negative.   Cardiovascular: Negative.   Gastrointestinal: Negative.   Endocrine: Negative.   Genitourinary: Negative.   Musculoskeletal: Positive for arthralgias and joint swelling (BL knee pain).  Skin: Negative.   Allergic/Immunologic: Positive for environmental allergies and immunocompromised state (due to Sickle cell disease). Negative for food allergies.  Neurological: Negative.   Hematological: Negative.   Psychiatric/Behavioral: Negative.     Blood pressure 105/61, pulse 91, temperature 100 F (37.8 C), height  (1.6 m), weight 123 lb 6.4 oz (56 kg), last menstrual period 03/09/2018.  Physical Exam Physical Exam  Constitutional: She is oriented to person, place, and time. She appears well-developed and well-nourished.  HENT:  Head: Normocephalic and atraumatic.  Nose: Nose normal.  Mouth/Throat: Oropharynx is clear and moist.  Eyes: Pupils are equal, round, and reactive to light. Conjunctivae and EOM are normal.  Neck: Normal range of motion. Neck supple.  Cardiovascular: Normal rate, regular rhythm, normal heart sounds and intact distal pulses.  Pulmonary/Chest: Effort normal and breath  sounds normal.  Abdominal: Soft. Bowel sounds are normal.  Genitourinary:  Genitourinary Comments: deferred  Musculoskeletal: Normal range of motion. She exhibits no edema, tenderness or deformity.  Neurological: She is alert and oriented to person, place, and time.  Skin: Skin is warm and dry. Capillary refill takes less than 2 seconds.  Psychiatric: She has a normal mood and affect.  Nursing note and vitals reviewed.  Data Reviewed No baseline  data Assessment and plan 1. Pulmonary hypertension (HCC) Currently asymptomatic. Will obtain a baseline echocardiogram once patient is approved for charity  2. Hb-SS disease without crisis [facility]) Patient is non-compliant with hydration and prophylactic medications. Will start on folate, Vitamin B-12, vitamin D, Pen V  BIB and iron. Patient has been advised tod rink at least 2 L of fluids a day. Naproxen  tid prn for pain to be taken with zantac  for GI prophylaxis. Patient advised to go to the ED asap when in crisis. Will obtain baseline labs  3. Arthritis Due to prior knee injury. Continue prn pain remedies and ROM exercises as tolerated  4. Health maintenance examination Normal physical exam. SS D with no extensive complications.  -CBC -CMP Mag Phos Urinalysis -Anemia panel - HepB+HepC+HIV Panel - RPR - Urinalysis, Routine w reflex microscopic - HIV antibody (with reflex)  RTC in 1 month  Magddalene S Tukov-Yual 04/04/2018, 2:30 PM
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
## 2018-04-16 ENCOUNTER — Emergency Department: Payer: Self-pay

## 2018-04-16 ENCOUNTER — Other Ambulatory Visit: Payer: Self-pay

## 2018-04-16 ENCOUNTER — Emergency Department
Admission: EM | Admit: 2018-04-16 | Discharge: 2018-04-16 | Disposition: A | Discharge status: Home / Self Care | Payer: Self-pay | Attending: Emergency Medicine | Admitting: Emergency Medicine

## 2018-04-16 ENCOUNTER — Encounter: Payer: Self-pay | Admitting: Emergency Medicine

## 2018-04-16 DIAGNOSIS — D571 Sickle-cell disease without crisis: Secondary | ICD-10-CM | POA: Insufficient documentation

## 2018-04-16 DIAGNOSIS — Z3202 Encounter for pregnancy test, result negative: Secondary | ICD-10-CM | POA: Insufficient documentation

## 2018-04-16 DIAGNOSIS — M25552 Pain in left hip: Secondary | ICD-10-CM

## 2018-04-16 DIAGNOSIS — F17200 Nicotine dependence, unspecified, uncomplicated: Secondary | ICD-10-CM | POA: Insufficient documentation

## 2018-04-16 LAB — CBC WITH DIFFERENTIAL/PLATELET
BAND NEUTROPHILS: 0 %
BASOS PCT: 1 %
Basophils Absolute: 0.1 10*3/uL (ref 0–0.1)
Blasts: 0 %
EOS PCT: 1 %
Eosinophils Absolute: 0.1 10*3/uL (ref 0–0.7)
HEMATOCRIT: 23.8 % — AB (ref 35.0–47.0)
Hemoglobin: 8.5 g/dL — ABNORMAL LOW (ref 12.0–16.0)
LYMPHS ABS: 4.8 10*3/uL — AB (ref 1.0–3.6)
LYMPHS PCT: 33 %
MCH: 35.4 pg — ABNORMAL HIGH (ref 26.0–34.0)
MCHC: 35.7 g/dL (ref 32.0–36.0)
MCV: 99 fL (ref 80.0–100.0)
METAMYELOCYTES PCT: 0 %
MONO ABS: 1.3 10*3/uL — AB (ref 0.2–0.9)
MONOS PCT: 9 %
Myelocytes: 0 %
NEUTROS ABS: 8.3 10*3/uL — AB (ref 1.4–6.5)
Neutrophils Relative %: 56 %
OTHER: 0 %
PLATELETS: 296 10*3/uL (ref 150–440)
Promyelocytes Relative: 0 %
RBC: 2.41 MIL/uL — ABNORMAL LOW (ref 3.80–5.20)
RDW: 25.7 % — AB (ref 11.5–14.5)
WBC: 14.6 10*3/uL — ABNORMAL HIGH (ref 3.6–11.0)
nRBC: 6 /100 WBC — ABNORMAL HIGH

## 2018-04-16 LAB — COMPREHENSIVE METABOLIC PANEL
ALBUMIN: 4.1 g/dL (ref 3.5–5.0)
ALT: 19 U/L (ref 14–54)
ANION GAP: 6 (ref 5–15)
AST: 39 U/L (ref 15–41)
Alkaline Phosphatase: 60 U/L (ref 38–126)
BILIRUBIN TOTAL: 4.6 mg/dL — AB (ref 0.3–1.2)
BUN: 7 mg/dL (ref 6–20)
CHLORIDE: 107 mmol/L (ref 101–111)
CO2: 23 mmol/L (ref 22–32)
Calcium: 9 mg/dL (ref 8.9–10.3)
Creatinine, Ser: 0.53 mg/dL (ref 0.44–1.00)
GFR calc Af Amer: >60 mL/min (ref >60)
GFR calc non Af Amer: >60 mL/min (ref >60)
GLUCOSE: 85 mg/dL (ref 65–99)
Potassium: 3.9 mmol/L (ref 3.5–5.1)
SODIUM: 136 mmol/L (ref 135–145)
TOTAL PROTEIN: 8.2 g/dL — AB (ref 6.5–8.1)

## 2018-04-16 LAB — RETICULOCYTES
RBC.: 2.41 MIL/uL — ABNORMAL LOW (ref 3.80–5.20)
RETIC COUNT ABSOLUTE: 376 10*3/uL — AB (ref 19.0–183.0)
Retic Ct Pct: 15.6 % — ABNORMAL HIGH (ref 0.4–3.1)

## 2018-04-16 LAB — POCT PREGNANCY, URINE: Preg Test, Ur: NEGATIVE

## 2018-04-16 MED ORDER — KETOROLAC TROMETHAMINE 30 MG/ML IJ SOLN
15.0000 mg | INTRAMUSCULAR | Status: AC
  Administered 2018-04-16: 15 mg via INTRAVENOUS
  Filled 2018-04-16: qty 1

## 2018-04-16 MED ORDER — OXYCODONE HCL 5 MG PO TABS: 5.0000 mg | ORAL_TABLET | Freq: Four times a day (QID) | ORAL | 0 refills | Status: DC | PRN

## 2018-04-16 MED ORDER — METHOCARBAMOL 500 MG PO TABS: 500.0000 mg | ORAL_TABLET | Freq: Four times a day (QID) | ORAL | 0 refills | Status: DC

## 2018-04-16 MED ORDER — SODIUM CHLORIDE 0.9 % IV SOLN
2.0000 g | Freq: Once | INTRAVENOUS | Status: AC
  Administered 2018-04-16: 2 g via INTRAVENOUS
  Filled 2018-04-16: qty 20

## 2018-04-16 MED ORDER — METHOCARBAMOL 500 MG PO TABS
500.0000 mg | ORAL_TABLET | Freq: Once | ORAL | Status: AC
  Administered 2018-04-16: 500 mg via ORAL
  Filled 2018-04-16: qty 1

## 2018-04-16 MED ORDER — FENTANYL CITRATE (PF) 100 MCG/2ML IJ SOLN
50.0000 ug | Freq: Once | INTRAMUSCULAR | Status: AC
  Administered 2018-04-16: 50 ug via INTRAVENOUS
  Filled 2018-04-16: qty 2

## 2018-04-16 MED ORDER — SODIUM CHLORIDE 0.9 % IV BOLUS
1000.0000 mL | Freq: Once | INTRAVENOUS | Status: AC
  Administered 2018-04-16: 1000 mL via INTRAVENOUS

## 2018-04-16 MED ORDER — HYDROMORPHONE HCL 1 MG/ML IJ SOLN: 1.0000 mg | Freq: Once | INTRAMUSCULAR | Status: DC

## 2018-04-16 MED ORDER — OXYCODONE-ACETAMINOPHEN 5-325 MG PO TABS
1.0000 | ORAL_TABLET | Freq: Once | ORAL | Status: AC
  Administered 2018-04-16: 1 via ORAL
  Filled 2018-04-16: qty 1
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
## 2018-04-16 NOTE — ED Provider Notes (Addendum)
[facility] Emergency Department Provider Note  ____________________________________________  Time seen: Approximately 1:27 PM  I have reviewed the triage vital signs and the nursing notes.   HISTORY  Chief Complaint Hip Pain and Joint Swelling    HPI Kara [name] is a 32 y.o. female who complains of pain in the left hip and both knees for the past 2-3 days. Gradual onset, constant, waxing and waning, hurts to walk on, no alleviating factors. Not improved with stretching. Feels similar to her sickle cell pain that she's had in the past but usually affects her on the right hip antedates the left hip. No new trauma recently. No fevers chills chest pain or shortness of breath. No other complaints or recent illness.  Patient recently moved to [facility] from [place], [facility] Has not seen hematology recently.      Past Medical History:  Diagnosis Date  . Arthritis   . Pulmonary hypertension (HCC)   . Sickle cell anemia [facility])      Patient Active Problem List   Diagnosis Date Noted  . Pulmonary hypertension (HCC) 04/04/2018  . Sickle cell disease (HCC) 04/04/2018  . Arthritis 04/04/2018  . Chest pain on breathing   . Leucocytosis 02/06/2018  . Sickle cell crisis (HCC) 02/05/2018  . Sickle cell pain crisis (HCC) 10/29/2017     Past Surgical History:  Procedure Laterality Date  . DILATION AND CURETTAGE OF UTERUS    . GALLBLADDER SURGERY       Prior to Admission medications   Medication Sig Start Date End Date Taking? Authorizing Provider  ferrous sulfate 325 (65 FE) MG tablet Take 1 tablet (325 mg total) by mouth daily with breakfast. 04/04/18  Yes Tukov-Yual, Alroy Bailiff, NP  folic acid (FOLVITE) 1 MG tablet Take 1 tablet (1 mg total) by mouth daily. 04/04/18  Yes Tukov-Yual, Magdalene S, NP  IBUPROFEN PO Take 500 mg by mouth every 8 (eight) hours as needed.    Yes [provider]  loratadine (CLARITIN) 10 MG tablet Take 1 tablet (10 mg total) by mouth daily. 04/04/18  Yes Tukov-Yual, Alroy Bailiff, NP  penicillin v potassium (VEETID) 250 MG tablet Take 2 tablets (500 mg total) by mouth 2 (two) times daily. 04/04/18  Yes Tukov-Yual, Alroy Bailiff, NP  vitamin B-12 (CYANOCOBALAMIN) 100 MCG tablet Take 1 tablet (100 mcg total) by mouth daily. 04/04/18  Yes Tukov-Yual, Alroy Bailiff, NP  Vitamin D, Ergocalciferol, (DRISDOL) 50000 units CAPS capsule Take 1 capsule (50,000 Units total) by mouth every 7 (seven) days. 04/04/18  Yes Tukov-Yual, Magdalene S, NP  naproxen (NAPROSYN) 500 MG tablet Take 1 tablet (500 mg total) by mouth 3 (three) times daily with meals as needed. 04/04/18   Tukov-Yual, Alroy Bailiff, NP  oxyCODONE (OXYCONTIN) 10 mg 12 hr tablet Take 10 mg by mouth every 12 (twelve) hours.    [provider]  ranitidine (ZANTAC) 75 MG tablet Take 2 tablets (150 mg total) by mouth 2 (two) times daily as needed for heartburn. Take with naproxen with zantac 04/04/18   Tukov-Yual, Alroy Bailiff, NP     Allergies Kiwi extract; Morphine and related; and Paroxetine   Family History  Adopted: Yes    Social History Social History   Tobacco Use  . Smoking status: Current Every Day Smoker  . Smokeless tobacco: Never Used  Substance Use Topics  . Alcohol use: No    Frequency: Never  . Drug  use: Not on file    Review of Systems  Constitutional:   No fever or chills.  ENT:   No sore throat. No rhinorrhea. Cardiovascular:   No chest pain or syncope. Respiratory:   No dyspnea or cough. Gastrointestinal:   Negative for abdominal pain, vomiting and diarrhea.  Musculoskeletal:   positive as above for pain and bilateral knees and left hip All other systems reviewed and are negative except as documented above in ROS and HPI.  ____________________________________________   PHYSICAL EXAM:  VITAL SIGNS: ED Triage Vitals  Enc Vitals Group     BP 04/16/18  0910 112/60     Pulse Rate 04/16/18 0910 98     Resp 04/16/18 0910 16     Temp 04/16/18 0910 98.4 F (36.9 C)     Temp Source 04/16/18 0910 Oral     SpO2 04/16/18 0910 94 %     Weight 04/16/18 0906 125 lb (56.7 kg)     Height 04/16/18 0906  (1.6 m)     Head Circumference --      Peak Flow --      Pain Score 04/16/18 0905 10     Pain Loc --      Pain Edu? --      Excl. in GC? --     Vital signs reviewed, nursing assessments reviewed.   Constitutional:   Alert and oriented. Well appearing and in no distress. Eyes:   Conjunctivae are normal. EOMI. PERRL. ENT      Head:   Normocephalic and atraumatic.      Nose:   No congestion/rhinnorhea.       Mouth/Throat:   MMM, no pharyngeal erythema. No peritonsillar mass.       Neck:   No meningismus. Full ROM. Hematological/Lymphatic/Immunilogical:   No cervical lymphadenopathy. Cardiovascular:   RRR. Symmetric bilateral radial and DP pulses.  No murmurs.  Respiratory:   Normal respiratory effort without tachypnea/retractions. Breath sounds are clear and equal bilaterally. No wheezes/rales/rhonchi. Gastrointestinal:   Soft and nontender. Non distended. There is no CVA tenderness.  No rebound, rigidity, or guarding.  Musculoskeletal:   Normal range of motion in all extremities. No joint effusions.  joints stable. No tenderness at the left hip. No pain with range of motion. Bilateral knees with no bony point tenderness although there is some generalized discomfort.. Neurologic:   Normal speech and language.  Motor grossly intact. No acute focal neurologic deficits are appreciated.  Skin:    Skin is warm, dry and intact. No rash noted.  No petechiae, purpura, or bullae.  ____________________________________________    LABS (pertinent positives/negatives) (all labs ordered are listed, but only abnormal results are displayed) Labs Reviewed  COMPREHENSIVE METABOLIC PANEL - Abnormal; Notable for the following components:      Result  Value   Total Protein 8.2 (*)    Total Bilirubin 4.6 (*)    All other components within normal limits  CBC WITH DIFFERENTIAL/PLATELET - Abnormal; Notable for the following components:   WBC 14.6 (*)    RBC 2.41 (*)    Hemoglobin 8.5 (*)    HCT 23.8 (*)    MCH 35.4 (*)    RDW 25.7 (*)    nRBC 6 (*)    Neutro Abs 8.3 (*)    Lymphs Abs 4.8 (*)    Monocytes Absolute 1.3 (*)    All other components within normal limits  RETICULOCYTES - Abnormal; Notable for the following components:   Retic Ct  Pct 15.6 (*)    RBC. 2.41 (*)    Retic Count, Absolute 376.0 (*)    All other components within normal limits  POCT PREGNANCY, URINE  POC URINE PREG, ED   ____________________________________________   EKG    ____________________________________________    RADIOLOGY  Dg Hip Unilat W Or Wo Pelvis 2-3 Views Left  Result Date: 04/16/2018 CLINICAL DATA:  Left hip pain. EXAM: DG HIP (WITH OR WITHOUT PELVIS) 2-3V LEFT COMPARISON:  None in PACs FINDINGS: The bones are subjectively adequately mineralized. There is minimal asymmetric narrowing of the left hip joint space. The articular surfaces of the left femoral head and acetabulum remains smoothly rounded. The femoral neck, intertrochanteric, and immediate subtrochanteric regions are normal. IMPRESSION: There is no acute bony abnormality of the left hip. There is minimal osteoarthritic joint space loss. Electronically Signed   By: David  [place] M.D.   On: 04/16/2018 11:36    ____________________________________________   PROCEDURES Procedures  ____________________________________________    CLINICAL IMPRESSION / ASSESSMENT AND PLAN / ED COURSE  Pertinent labs & imaging results that were available during my care of the patient were reviewed by me and considered in my medical decision making (see chart for details).    patient presents with left hip pain and bilateral knee pain in the setting of sickle cell disease. Consistent with  atypical pain exacerbation. We'll give fentanyl Toradol and IV fluids for hydration. Check labs and x-ray of the left hip for possible fracture or necrosis.  Clinical Course as of Apr 18 1527  Tue Apr 16, 2018  1320 Workup unremarkable, as expected for underlying sickle cell disease. No fevers. Suitable for DC home and outpatient follow up with hematology.   [PS]  1321 Wbc chronically elevated to at least 14k. Appears to be at baseline. 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 Doubt septic arthritis or SSTI.   [PS]  1357 Persistent hip pain. Resolved briefly after initial analgesics, now recurrent. Still calm, comfortable, eating a sandwich. Will give additional iv dilaudid.     [PS]    Clinical Course User Index [PS] Sharman Cheek, MD     ----------------------------------------- 1:31 PM on 04/16/2018 -----------------------------------------  Plan to discharge home and outpatient follow-up after IV ceftriaxone infusion.  ____________________________________________   FINAL CLINICAL IMPRESSION(S) / ED DIAGNOSES    Final diagnoses:  Acute pain of left hip  Hb-SS disease without crisis [facility])     ED Discharge Orders    None      Portions of this note were generated with dragon dictation software. Dictation errors may occur despite best attempts at proofreading.    Sharman Cheek, MD 04/16/18 1331    Sharman Cheek, MD 04/17/18 [phone]

## 2018-04-16 NOTE — ED Notes (Signed)
PT asked for urine specimen. Pt states she cannot urinate right now but will press call bell when she can.

## 2018-04-16 NOTE — Discharge Instructions (Addendum)
Your labs and xrays today were unremarakble. Stay well hydrated and follow up with hematology/oncology for continued management of your sickle cell disease.

## 2018-04-16 NOTE — ED Provider Notes (Signed)
This patient was signed out to me by Dr. Scotty Court.  32 year old female with hip pain that is typical of her sickle cell crisis pain.  Here, the patient has been given fentanyl and Toradol, as well as Robaxin and Percocet without significant improvement in her pain.  However, she has been able to ambulate without any difficulty.  In addition, she is hemodynamically stable.  Her white blood cell count is slightly elevated at 14.6 but there is no evidence for septic arthritis on my examination.  Her left hip x-ray does not show any fracture.  At this time, the patient is requesting discharge so that she can go to her class, and will plan to have her follow-up closely with her hematologist.  Return precautions were discussed.   Rockne Menghini, MD 04/16/18 [id number]

## 2018-05-02 ENCOUNTER — Ambulatory Visit: Payer: Self-pay | Admitting: Adult Health

## 2018-05-02 ENCOUNTER — Ambulatory Visit: Payer: Self-pay | Admitting: Licensed Clinical Social Worker

## 2018-05-08 ENCOUNTER — Encounter: Payer: Self-pay | Admitting: Licensed Clinical Social Worker

## 2018-05-09 ENCOUNTER — Ambulatory Visit: Payer: Self-pay | Admitting: Adult Health

## 2018-05-09 ENCOUNTER — Ambulatory Visit: Payer: Self-pay | Admitting: Licensed Clinical Social Worker

## 2018-07-23 ENCOUNTER — Telehealth: Payer: Self-pay | Admitting: Adult Health Nurse Practitioner

## 2018-07-23 NOTE — Telephone Encounter (Signed)
Patient called asking for work letter regarding not standing all day.

## 2018-07-23 NOTE — Telephone Encounter (Signed)
Asked for letter for work regarding not standing all day.

## 2018-12-22 ENCOUNTER — Encounter: Payer: Self-pay | Admitting: Emergency Medicine

## 2018-12-22 ENCOUNTER — Emergency Department
Admission: EM | Admit: 2018-12-22 | Discharge: 2018-12-22 | Disposition: A | Discharge status: Home / Self Care | Payer: Medicaid Other | Attending: Emergency Medicine | Admitting: Emergency Medicine

## 2018-12-22 ENCOUNTER — Other Ambulatory Visit: Payer: Self-pay

## 2018-12-22 DIAGNOSIS — Z79899 Other long term (current) drug therapy: Secondary | ICD-10-CM | POA: Insufficient documentation

## 2018-12-22 DIAGNOSIS — R21 Rash and other nonspecific skin eruption: Secondary | ICD-10-CM

## 2018-12-22 DIAGNOSIS — F17200 Nicotine dependence, unspecified, uncomplicated: Secondary | ICD-10-CM | POA: Insufficient documentation

## 2018-12-22 MED ORDER — PREDNISONE 50 MG PO TABS: 50.0000 mg | ORAL_TABLET | Freq: Every day | ORAL | 0 refills | Status: DC

## 2018-12-22 MED ORDER — PREDNISONE 20 MG PO TABS
60.0000 mg | ORAL_TABLET | Freq: Once | ORAL | Status: AC
  Administered 2018-12-22: 60 mg via ORAL
  Filled 2018-12-22: qty 3

## 2018-12-22 NOTE — ED Triage Notes (Signed)
Pt arrived via POV with reports of systemic rash that was first noticed about 2 days ago, pt states she has been staying in a hotel since Nov, pt states Benadryl does not work for her.

## 2018-12-22 NOTE — ED Notes (Signed)
Pt has call light on, wanting blanket when ED tech walked in with blanket from the first time the patient asked 5 mins prior, the pt told the tech that she has sickle cell and is having back pain and needed the blanket.

## 2018-12-22 NOTE — ED Notes (Signed)
Pt out to nurse's station asking for blanket, informed her we would get her one as soon as possible.

## 2018-12-22 NOTE — ED Provider Notes (Signed)
[facility] Emergency Department Provider Note  ____________________________________________  Time seen: Approximately 6:38 PM  I have reviewed the triage vital signs and the nursing notes.   HISTORY  Chief Complaint Rash    HPI Charleen KirksSherry Egerton is a 33 y.o. female who presents the emergency department complaining of multiple skin lesions.  Patient reports that she has had "bumps" both out "all over my body."  They are solitary lesions that appear.  Patient states they are very pruritic in nature.  Patient reports that she believes she is "coming contact with something."  Patient denies any systemic complaints.  She states that she is unable to take Benadryl and that "typically I need a steroid."   Patient lives in [place] motel.  She has stayed at other peoples residences recently.  Patient denies any new foods, medications, soaps or shampoos.   Past Medical History:  Diagnosis Date  . Arthritis   . Pulmonary hypertension (HCC)   . Sickle cell anemia [facility](HCC)     Patient Active Problem List   Diagnosis Date Noted  . Pulmonary hypertension (HCC) 04/04/2018  . Sickle cell disease (HCC) 04/04/2018  . Arthritis 04/04/2018  . Chest pain on breathing   . Leucocytosis 02/06/2018  . Sickle cell crisis (HCC) 02/05/2018  . Sickle cell pain crisis (HCC) 10/29/2017    Past Surgical History:  Procedure Laterality Date  . DILATION AND CURETTAGE OF UTERUS    . GALLBLADDER SURGERY      Prior to Admission medications   Medication Sig Start Date End Date Taking? Authorizing Provider  ferrous sulfate 325 (65 FE) MG tablet Take 1 tablet (325 mg total) by mouth daily with breakfast. 04/04/18   Tukov-Yual, Alroy BailiffMagdalene S, NP  folic acid (FOLVITE) 1 MG tablet Take 1 tablet (1 mg total) by mouth daily. 04/04/18   Tukov-Yual, Alroy BailiffMagdalene S, NP  IBUPROFEN PO Take 500 mg by mouth every 8 (eight) hours as needed.     [provider]  loratadine (CLARITIN) 10 MG tablet Take 1 tablet  (10 mg total) by mouth daily. 04/04/18   Tukov-Yual, Alroy BailiffMagdalene S, NP  methocarbamol (ROBAXIN) 500 MG tablet Take 1 tablet (500 mg total) by mouth 4 (four) times daily. 04/16/18   Sharman CheekStafford, Phillip, MD  naproxen (NAPROSYN) 500 MG tablet Take 1 tablet (500 mg total) by mouth 3 (three) times daily with meals as needed. 04/04/18   Tukov-Yual, Alroy BailiffMagdalene S, NP  oxyCODONE (OXYCONTIN) 10 mg 12 hr tablet Take 10 mg by mouth every 12 (twelve) hours.    [provider]  oxyCODONE (ROXICODONE) 5 MG immediate release tablet Take 1 tablet (5 mg total) by mouth every 6 (six) hours as needed for breakthrough pain. 04/16/18   Sharman CheekStafford, Phillip, MD  penicillin v potassium (VEETID) 250 MG tablet Take 2 tablets (500 mg total) by mouth 2 (two) times daily. 04/04/18   Tukov-Yual, Alroy BailiffMagdalene S, NP  predniSONE (DELTASONE) 50 MG tablet Take 1 tablet (50 mg total) by mouth daily with breakfast. 12/22/18   , Delorise RoyalsJonathan D, PA-C  ranitidine (ZANTAC) 75 MG tablet Take 2 tablets (150 mg total) by mouth 2 (two) times daily as needed for heartburn. Take with naproxen with zantac 04/04/18   Tukov-Yual, Alroy BailiffMagdalene S, NP  vitamin B-12 (CYANOCOBALAMIN) 100 MCG tablet Take 1 tablet (100 mcg total) by mouth daily. 04/04/18   Tukov-Yual, Alroy BailiffMagdalene S, NP  Vitamin D, Ergocalciferol, (DRISDOL) 50000 units CAPS capsule Take 1 capsule (50,000 Units total) by mouth every 7 (seven) days. 04/04/18  Andreas Ohm, NP    Allergies Kiwi extract; Morphine and related; and Paroxetine  Family History  Adopted: Yes    Social History Social History   Tobacco Use  . Smoking status: Current Every Day Smoker  . Smokeless tobacco: Never Used  Substance Use Topics  . Alcohol use: No    Frequency: Never  . Drug use: Not on file     Review of Systems  Constitutional: No fever/chills Eyes: No visual changes.  Cardiovascular: no chest pain. Respiratory: no cough. No SOB. Gastrointestinal: No abdominal pain.  No nausea, no  vomiting.   Musculoskeletal: Negative for musculoskeletal pain. Skin: Positive for multiple pruritic lesions scattered across the body. Neurological: Negative for headaches, focal weakness or numbness. 10-point ROS otherwise negative.  ____________________________________________   PHYSICAL EXAM:  VITAL SIGNS: ED Triage Vitals  Enc Vitals Group     BP 12/22/18 1711 126/74     Pulse Rate 12/22/18 1711 98     Resp 12/22/18 1711 16     Temp 12/22/18 1711 98.4 F (36.9 C)     Temp Source 12/22/18 1711 Oral     SpO2 12/22/18 1711 96 %     Weight 12/22/18 1706 130 lb (59 kg)     Height 12/22/18 1706 5\' 4"  (1.626 m)     Head Circumference --      Peak Flow --      Pain Score 12/22/18 1707 0     Pain Loc --      Pain Edu? --      Excl. in GC? --      Constitutional: Alert and oriented. Well appearing and in no acute distress. Eyes: Conjunctivae are normal. PERRL. EOMI. Head: Atraumatic. Neck: No stridor.    Cardiovascular: Normal rate, regular rhythm. Normal S1 and S2.  Good peripheral circulation. Respiratory: Normal respiratory effort without tachypnea or retractions. Lungs CTAB. Good air entry to the bases with no decreased or absent breath sounds. Musculoskeletal: Full range of motion to all extremities. No gross deformities appreciated. Neurologic:  Normal speech and language. No gross focal neurologic deficits are appreciated.  Skin:  Skin is warm, dry and intact. No rash noted.  A few, scattered lesions consistent with bedbug bites are appreciated.  No hives, wheals, urticaria.  No plaques.  No evidence of cellulitic changes. Psychiatric: Mood and affect are normal. Speech and behavior are normal. Patient exhibits appropriate insight and judgement.   ____________________________________________   LABS (all labs ordered are listed, but only abnormal results are displayed)  Labs Reviewed - No data to  display ____________________________________________  EKG   ____________________________________________  RADIOLOGY   No results found.  ____________________________________________    PROCEDURES  Procedure(s) performed:    Procedures    Medications  predniSONE (DELTASONE) tablet 60 mg (has no administration in time range)     ____________________________________________   INITIAL IMPRESSION / ASSESSMENT AND PLAN / ED COURSE  Pertinent labs & imaging results that were available during my care of the patient were reviewed by me and considered in my medical decision making (see chart for details).  Review of the [place] CSRS was performed in accordance of the [facility] prior to dispensing any controlled drugs.      Patient's diagnosis is consistent with rash.  Patient presents emergency department with a pruritic rash.  On exam, lesions appear to be consistent with bedbug bites.  Patient insists that she has been bitten by bedbugs before and she does not believe this rash is  consistent with same.  She requests steroids.  I advised patient that I will prescribe a steroid for symptom relief, however if this does not improve rash, it is likely bites.  Patient verbalizes understanding.. Patient will be discharged home with prescriptions for prednisone. Patient is to follow up with primary care as needed or otherwise directed. Patient is given ED precautions to return to the ED for any worsening or new symptoms.     ____________________________________________  FINAL CLINICAL IMPRESSION(S) / ED DIAGNOSES  Final diagnoses:  Rash and nonspecific skin eruption      NEW MEDICATIONS STARTED DURING THIS VISIT:  ED Discharge Orders         Ordered    predniSONE (DELTASONE) 50 MG tablet  Daily with breakfast     12/22/18 1856              This chart was dictated using voice recognition software/Dragon. Despite best efforts to proofread, errors can occur which can change  the meaning. Any change was purely unintentional.    Racheal PatchesCuthriell,  D, PA-C 12/22/18 1857    Rockne MenghiniNorman, Anne-Caroline, MD 12/23/18 [phone]

## 2018-12-27 ENCOUNTER — Encounter: Payer: Self-pay | Admitting: Emergency Medicine

## 2018-12-27 ENCOUNTER — Encounter (HOSPITAL_COMMUNITY): Payer: Self-pay

## 2018-12-27 ENCOUNTER — Inpatient Hospital Stay (HOSPITAL_COMMUNITY)
Admission: AD | Admit: 2018-12-27 | Discharge: 2018-12-31 | DRG: 812 | Disposition: A | Discharge status: Home / Self Care | Payer: Medicaid Other | Source: Other Acute Inpatient Hospital | Attending: Internal Medicine | Admitting: Internal Medicine

## 2018-12-27 ENCOUNTER — Emergency Department
Admission: EM | Admit: 2018-12-27 | Discharge: 2018-12-27 | Disposition: A | Discharge status: Other Acute Inpatient Hospital | Payer: Medicaid Other | Attending: Emergency Medicine | Admitting: Emergency Medicine

## 2018-12-27 ENCOUNTER — Emergency Department: Payer: Medicaid Other

## 2018-12-27 ENCOUNTER — Other Ambulatory Visit: Payer: Self-pay

## 2018-12-27 DIAGNOSIS — R451 Restlessness and agitation: Secondary | ICD-10-CM | POA: Diagnosis present

## 2018-12-27 DIAGNOSIS — Z79891 Long term (current) use of opiate analgesic: Secondary | ICD-10-CM | POA: Diagnosis not present

## 2018-12-27 DIAGNOSIS — D72829 Elevated white blood cell count, unspecified: Secondary | ICD-10-CM

## 2018-12-27 DIAGNOSIS — Z9981 Dependence on supplemental oxygen: Secondary | ICD-10-CM | POA: Diagnosis not present

## 2018-12-27 DIAGNOSIS — F411 Generalized anxiety disorder: Secondary | ICD-10-CM | POA: Diagnosis present

## 2018-12-27 DIAGNOSIS — Z91018 Allergy to other foods: Secondary | ICD-10-CM

## 2018-12-27 DIAGNOSIS — F17200 Nicotine dependence, unspecified, uncomplicated: Secondary | ICD-10-CM | POA: Insufficient documentation

## 2018-12-27 DIAGNOSIS — Z79899 Other long term (current) drug therapy: Secondary | ICD-10-CM

## 2018-12-27 DIAGNOSIS — F329 Major depressive disorder, single episode, unspecified: Secondary | ICD-10-CM | POA: Diagnosis present

## 2018-12-27 DIAGNOSIS — G894 Chronic pain syndrome: Secondary | ICD-10-CM | POA: Diagnosis present

## 2018-12-27 DIAGNOSIS — D562 Delta-beta thalassemia: Secondary | ICD-10-CM | POA: Diagnosis present

## 2018-12-27 DIAGNOSIS — F419 Anxiety disorder, unspecified: Secondary | ICD-10-CM | POA: Diagnosis present

## 2018-12-27 DIAGNOSIS — E876 Hypokalemia: Secondary | ICD-10-CM | POA: Diagnosis present

## 2018-12-27 DIAGNOSIS — M1990 Unspecified osteoarthritis, unspecified site: Secondary | ICD-10-CM | POA: Diagnosis present

## 2018-12-27 DIAGNOSIS — I272 Pulmonary hypertension, unspecified: Secondary | ICD-10-CM | POA: Diagnosis present

## 2018-12-27 DIAGNOSIS — D5700 Hb-SS disease with crisis, unspecified: Secondary | ICD-10-CM | POA: Diagnosis present

## 2018-12-27 DIAGNOSIS — D638 Anemia in other chronic diseases classified elsewhere: Secondary | ICD-10-CM

## 2018-12-27 DIAGNOSIS — Z791 Long term (current) use of non-steroidal anti-inflammatories (NSAID): Secondary | ICD-10-CM

## 2018-12-27 DIAGNOSIS — Z888 Allergy status to other drugs, medicaments and biological substances status: Secondary | ICD-10-CM

## 2018-12-27 DIAGNOSIS — Z5329 Procedure and treatment not carried out because of patient's decision for other reasons: Secondary | ICD-10-CM | POA: Diagnosis present

## 2018-12-27 DIAGNOSIS — Z885 Allergy status to narcotic agent status: Secondary | ICD-10-CM | POA: Diagnosis not present

## 2018-12-27 DIAGNOSIS — R52 Pain, unspecified: Secondary | ICD-10-CM | POA: Diagnosis present

## 2018-12-27 LAB — CBC WITH DIFFERENTIAL/PLATELET
Abs Immature Granulocytes: 0.39 10*3/uL — ABNORMAL HIGH (ref 0.00–0.07)
Basophils Absolute: 0.1 10*3/uL (ref 0.0–0.1)
Basophils Relative: 0 %
Eosinophils Absolute: 0 10*3/uL (ref 0.0–0.5)
Eosinophils Relative: 0 %
HEMATOCRIT: 22.1 % — AB (ref 36.0–46.0)
HEMOGLOBIN: 7.8 g/dL — AB (ref 12.0–15.0)
IMMATURE GRANULOCYTES: 2 %
LYMPHS PCT: 46 %
Lymphs Abs: 8.6 10*3/uL — ABNORMAL HIGH (ref 0.7–4.0)
MCH: 34.4 pg — ABNORMAL HIGH (ref 26.0–34.0)
MCHC: 35.3 g/dL (ref 30.0–36.0)
MCV: 97.4 fL (ref 80.0–100.0)
MONO ABS: 1.6 10*3/uL — AB (ref 0.1–1.0)
MONOS PCT: 8 %
NEUTROS PCT: 44 %
Neutro Abs: 8.3 10*3/uL — ABNORMAL HIGH (ref 1.7–7.7)
Platelets: 318 10*3/uL (ref 150–400)
RBC: 2.27 MIL/uL — ABNORMAL LOW (ref 3.87–5.11)
RDW: 20.5 % — AB (ref 11.5–15.5)
WBC: 18.9 10*3/uL — ABNORMAL HIGH (ref 4.0–10.5)
nRBC: 1.1 % — ABNORMAL HIGH (ref 0.0–0.2)

## 2018-12-27 LAB — COMPREHENSIVE METABOLIC PANEL
ALBUMIN: 4.6 g/dL (ref 3.5–5.0)
ALT: 13 U/L (ref 0–44)
ANION GAP: 7 (ref 5–15)
AST: 28 U/L (ref 15–41)
Alkaline Phosphatase: 41 U/L (ref 38–126)
BUN: 9 mg/dL (ref 6–20)
CHLORIDE: 104 mmol/L (ref 98–111)
CO2: 27 mmol/L (ref 22–32)
Calcium: 9 mg/dL (ref 8.9–10.3)
Creatinine, Ser: 0.48 mg/dL (ref 0.44–1.00)
GFR calc Af Amer: >60 mL/min (ref >60)
GFR calc non Af Amer: >60 mL/min (ref >60)
GLUCOSE: 103 mg/dL — AB (ref 70–99)
POTASSIUM: 3.4 mmol/L — AB (ref 3.5–5.1)
SODIUM: 138 mmol/L (ref 135–145)
Total Bilirubin: 5.4 mg/dL — ABNORMAL HIGH (ref 0.3–1.2)
Total Protein: 8.1 g/dL (ref 6.5–8.1)

## 2018-12-27 LAB — PATHOLOGIST SMEAR REVIEW

## 2018-12-27 LAB — URINALYSIS, COMPLETE (UACMP) WITH MICROSCOPIC
Bilirubin Urine: NEGATIVE
Glucose, UA: NEGATIVE mg/dL
Hgb urine dipstick: NEGATIVE
Ketones, ur: NEGATIVE mg/dL
Leukocytes, UA: NEGATIVE
NITRITE: NEGATIVE
PROTEIN: NEGATIVE mg/dL
SPECIFIC GRAVITY, URINE: 1.009 (ref 1.005–1.030)
pH: 8 (ref 5.0–8.0)

## 2018-12-27 LAB — URINE DRUG SCREEN, QUALITATIVE (ARMC ONLY)
AMPHETAMINES, UR SCREEN: NOT DETECTED
Barbiturates, Ur Screen: NOT DETECTED
Benzodiazepine, Ur Scrn: NOT DETECTED
COCAINE METABOLITE, UR ~~LOC~~: NOT DETECTED
Cannabinoid 50 Ng, Ur ~~LOC~~: NOT DETECTED
MDMA (ECSTASY) UR SCREEN: NOT DETECTED
METHADONE SCREEN, URINE: NOT DETECTED
Opiate, Ur Screen: NOT DETECTED
Phencyclidine (PCP) Ur S: NOT DETECTED
TRICYCLIC, UR SCREEN: NOT DETECTED

## 2018-12-27 LAB — RETICULOCYTES
Immature Retic Fract: 35.8 % — ABNORMAL HIGH (ref 2.3–15.9)
RBC.: 2.27 MIL/uL — AB (ref 3.87–5.11)
RETIC COUNT ABSOLUTE: 377 10*3/uL — AB (ref 19.0–186.0)
RETIC CT PCT: 16.6 % — AB (ref 0.4–3.1)

## 2018-12-27 LAB — POCT PREGNANCY, URINE: PREG TEST UR: NEGATIVE

## 2018-12-27 MED ORDER — SODIUM CHLORIDE 0.9 % IV BOLUS
1000.0000 mL | Freq: Once | INTRAVENOUS | Status: AC
  Administered 2018-12-27: 1000 mL via INTRAVENOUS

## 2018-12-27 MED ORDER — HYDROMORPHONE HCL 1 MG/ML IJ SOLN
INTRAMUSCULAR | Status: AC
  Filled 2018-12-27: qty 1

## 2018-12-27 MED ORDER — HYDROMORPHONE HCL 1 MG/ML IJ SOLN
2.0000 mg | Freq: Once | INTRAMUSCULAR | Status: AC
  Administered 2018-12-27: 2 mg via INTRAVENOUS
  Filled 2018-12-27: qty 2

## 2018-12-27 MED ORDER — ONDANSETRON HCL 4 MG/2ML IJ SOLN
4.0000 mg | Freq: Once | INTRAMUSCULAR | Status: AC
  Administered 2018-12-27: 4 mg via INTRAVENOUS
  Filled 2018-12-27: qty 2

## 2018-12-27 MED ORDER — SODIUM CHLORIDE 0.9 % IV SOLN
25.0000 mg | INTRAVENOUS | Status: DC | PRN
  Filled 2018-12-27: qty 0.5

## 2018-12-27 MED ORDER — OXYCODONE-ACETAMINOPHEN 5-325 MG PO TABS
ORAL_TABLET | ORAL | Status: AC
  Filled 2018-12-27: qty 2

## 2018-12-27 MED ORDER — KETOROLAC TROMETHAMINE 30 MG/ML IJ SOLN: 15.0000 mg | Freq: Four times a day (QID) | INTRAMUSCULAR | Status: DC

## 2018-12-27 MED ORDER — HYDROMORPHONE HCL 1 MG/ML IJ SOLN
1.0000 mg | Freq: Once | INTRAMUSCULAR | Status: AC
  Administered 2018-12-27: 1 mg via INTRAVENOUS
  Filled 2018-12-27: qty 1

## 2018-12-27 MED ORDER — FENTANYL CITRATE (PF) 100 MCG/2ML IJ SOLN
100.0000 ug | Freq: Once | INTRAMUSCULAR | Status: AC
  Administered 2018-12-27: 100 ug via INTRAVENOUS
  Filled 2018-12-27: qty 2

## 2018-12-27 MED ORDER — OXYCODONE-ACETAMINOPHEN 5-325 MG PO TABS
2.0000 | ORAL_TABLET | Freq: Once | ORAL | Status: AC
  Administered 2018-12-27: 2 via ORAL

## 2018-12-27 MED ORDER — NALOXONE HCL 0.4 MG/ML IJ SOLN: 0.4000 mg | INTRAMUSCULAR | Status: DC | PRN

## 2018-12-27 MED ORDER — ONDANSETRON HCL 4 MG/2ML IJ SOLN: 4.0000 mg | Freq: Four times a day (QID) | INTRAMUSCULAR | Status: DC | PRN

## 2018-12-27 MED ORDER — SENNOSIDES-DOCUSATE SODIUM 8.6-50 MG PO TABS
1.0000 | ORAL_TABLET | Freq: Two times a day (BID) | ORAL | Status: DC
  Administered 2018-12-27 – 2018-12-30 (×3): 1 via ORAL
  Filled 2018-12-27 (×4): qty 1

## 2018-12-27 MED ORDER — POLYETHYLENE GLYCOL 3350 17 G PO PACK: 17.0000 g | PACK | Freq: Every day | ORAL | Status: DC | PRN

## 2018-12-27 MED ORDER — KETOROLAC TROMETHAMINE 15 MG/ML IJ SOLN
15.0000 mg | Freq: Four times a day (QID) | INTRAMUSCULAR | Status: DC
  Administered 2018-12-27 – 2018-12-30 (×13): 15 mg via INTRAVENOUS
  Filled 2018-12-27 (×13): qty 1

## 2018-12-27 MED ORDER — HYDROMORPHONE 1 MG/ML IV SOLN
INTRAVENOUS | Status: DC
  Administered 2018-12-27: 2.5 mg via INTRAVENOUS
  Administered 2018-12-27: 30 mg via INTRAVENOUS
  Administered 2018-12-28 (×2): 1.5 mg via INTRAVENOUS
  Administered 2018-12-28: 0.5 mg via INTRAVENOUS
  Administered 2018-12-28: 2 mg via INTRAVENOUS
  Administered 2018-12-28: 0.5 mg via INTRAVENOUS
  Administered 2018-12-28: 3.5 mg via INTRAVENOUS
  Administered 2018-12-28: 1 mg via INTRAVENOUS
  Administered 2018-12-29: 2 mg via INTRAVENOUS
  Administered 2018-12-29 (×2): 1.5 mg via INTRAVENOUS
  Administered 2018-12-29: 2 mg via INTRAVENOUS
  Administered 2018-12-29: 3.5 mg via INTRAVENOUS
  Administered 2018-12-29: 30 mg via INTRAVENOUS
  Administered 2018-12-29 – 2018-12-30 (×2): 4 mg via INTRAVENOUS
  Administered 2018-12-30: 0 mg via INTRAVENOUS
  Administered 2018-12-30: 1.5 mg via INTRAVENOUS
  Administered 2018-12-30 – 2018-12-31 (×2): 0 mg via INTRAVENOUS
  Filled 2018-12-27 (×3): qty 30

## 2018-12-27 MED ORDER — DEXTROSE-NACL 5-0.45 % IV SOLN
INTRAVENOUS | Status: DC
  Administered 2018-12-27 – 2018-12-29 (×4): via INTRAVENOUS

## 2018-12-27 MED ORDER — SODIUM CHLORIDE 0.9% FLUSH: 9.0000 mL | INTRAVENOUS | Status: DC | PRN

## 2018-12-27 MED ORDER — DIPHENHYDRAMINE HCL 25 MG PO CAPS: 25.0000 mg | ORAL_CAPSULE | ORAL | Status: DC | PRN

## 2018-12-27 MED ORDER — SODIUM CHLORIDE 0.9% FLUSH
3.0000 mL | Freq: Once | INTRAVENOUS | Status: AC
  Administered 2018-12-27: 3 mL via INTRAVENOUS

## 2018-12-27 NOTE — Plan of Care (Signed)

## 2018-12-27 NOTE — H&P (Addendum)
H&P  Patient Demographics:  Kara [name], is a 33 y.o. female  MRN: [id number]   DOB - Apr 12, 1986  Admit Date - 12/27/2018  Outpatient Primary MD for the patient is Patient, No Pcp Per  No chief complaint on file.     HPI:   Kara [name]  is a 33 y.o. female with a medical history significant for sickle cell anemia, chronic pain syndrome, pulmonary hypertension, anxiety and depression presented to the emergency department complaining generalized pain consistent with typical sickle cell pain crisis.  Patient attributes current pain crisis to changes in weather and increased stressors.  Patient states that she relocated to [place], [place] greater than a year ago.  She states that she has not established care with hematologist or primary care provider due to transportation constraints.  Patient mostly visit the emergency department for primary care needs.  Patient was transferred from [facility] for further management of acute sickle cell pain crisis.  Pain intensity 10/10 characterized as constant and throbbing.  She currently denies fever, chills, or persistent cough. Patient also denies headache, chest pain, abdominal pain, dysuria, nausea, vomiting, or diarrhea.  ER Course:  WBCs 18.9, hemoglobin 7.8, potassium 3.4.  Patient chest xray shows no evidence of acute cardiopulmonary disease.  Pain intensity continues despite IV Dilaudid, IV fluids, and IV Toradol.  Patient admitted to [facility] for sickle cell pain crisis.    Review of systems:  Review of Systems  Constitutional: Positive for malaise/fatigue. Negative for chills, fever and weight loss.  Eyes: Negative.   Respiratory: Negative.   Cardiovascular: Negative for chest pain, palpitations and orthopnea.  Gastrointestinal: Negative.   Genitourinary: Negative for dysuria, frequency and urgency.  Musculoskeletal: Negative.  Negative for back pain.  Neurological: Negative.   Endo/Heme/Allergies: Negative.    Psychiatric/Behavioral: Negative for depression, substance abuse and suicidal ideas.    A full 10 point Review of Systems was done, except as stated above, all other Review of Systems were negative.  With Past History of the following :   Past Medical History:  Diagnosis Date  . Arthritis   . Pulmonary hypertension (HCC)   . Sickle cell anemia (HCC)       Past Surgical History:  Procedure Laterality Date  . DILATION AND CURETTAGE OF UTERUS    . GALLBLADDER SURGERY       Social History:   Social History   Tobacco Use  . Smoking status: Current Every Day Smoker  . Smokeless tobacco: Never Used  Substance Use Topics  . Alcohol use: No    Frequency: Never     Lives - At home   Family History :   Family History  Adopted: Yes     Home Medications:   Prior to Admission medications   Medication Sig Start Date End Date Taking? Authorizing Provider  ferrous sulfate 325 (65 FE) MG tablet Take 1 tablet (325 mg total) by mouth daily with breakfast. 04/04/18   Tukov-Yual, Alroy Bailiff, NP  folic acid (FOLVITE) 1 MG tablet Take 1 tablet (1 mg total) by mouth daily. 04/04/18   Tukov-Yual, Alroy Bailiff, NP  IBUPROFEN PO Take 500 mg by mouth every 8 (eight) hours as needed.     [provider]  loratadine (CLARITIN) 10 MG tablet Take 1 tablet (10 mg total) by mouth daily. 04/04/18   Tukov-Yual, Alroy Bailiff, NP  methocarbamol (ROBAXIN) 500 MG tablet Take 1 tablet (500 mg total) by mouth 4 (four) times daily. 04/16/18   Sharman Cheek,  MD  naproxen (NAPROSYN) 500 MG tablet Take 1 tablet (500 mg total) by mouth 3 (three) times daily with meals as needed. 04/04/18   Tukov-Yual, Alroy BailiffMagdalene S, NP  oxyCODONE (OXYCONTIN) 10 mg 12 hr tablet Take 10 mg by mouth every 12 (twelve) hours.    [provider]  oxyCODONE (ROXICODONE) 5 MG immediate release tablet Take 1 tablet (5 mg total) by mouth every 6 (six) hours as needed for breakthrough pain. 04/16/18   Sharman CheekStafford, Phillip, MD   penicillin v potassium (VEETID) 250 MG tablet Take 2 tablets (500 mg total) by mouth 2 (two) times daily. 04/04/18   Tukov-Yual, Alroy BailiffMagdalene S, NP  predniSONE (DELTASONE) 50 MG tablet Take 1 tablet (50 mg total) by mouth daily with breakfast. 12/22/18   Cuthriell, Delorise RoyalsJonathan D, PA-C  ranitidine (ZANTAC) 75 MG tablet Take 2 tablets (150 mg total) by mouth 2 (two) times daily as needed for heartburn. Take with naproxen with zantac 04/04/18   Tukov-Yual, Alroy BailiffMagdalene S, NP  vitamin B-12 (CYANOCOBALAMIN) 100 MCG tablet Take 1 tablet (100 mcg total) by mouth daily. 04/04/18   Tukov-Yual, Alroy BailiffMagdalene S, NP  Vitamin D, Ergocalciferol, (DRISDOL) 50000 units CAPS capsule Take 1 capsule (50,000 Units total) by mouth every 7 (seven) days. 04/04/18   Andreas Ohmukov-Yual, Magdalene S, NP     Allergies:   Allergies  Allergen Reactions  . Kiwi Extract Hives  . Morphine And Related Hives  . Paroxetine Hives     Physical Exam:   Vitals:   There were no vitals filed for this visit.  Physical Exam: Constitutional: Patient appears well-developed and well-nourished. Not in obvious distress. HENT: Normocephalic, atraumatic, External right and left ear normal. Oropharynx is clear and moist.  Eyes: Conjunctivae and EOM are normal. PERRLA, no scleral icterus. Neck: Normal ROM. Neck supple. No JVD. No tracheal deviation. No thyromegaly. CVS: RRR, S1/S2 +, no murmurs, no gallops, no carotid bruit.  Pulmonary: Effort and breath sounds normal, no stridor, rhonchi, wheezes, rales.  Abdominal: Soft. BS +, no distension, tenderness, rebound or guarding.  Musculoskeletal: Normal range of motion. No edema and no tenderness.  Lymphadenopathy: No lymphadenopathy noted, cervical, inguinal or axillary Neuro: Alert. Normal reflexes, muscle tone coordination. No cranial nerve deficit. Skin: Skin is warm and dry. No rash noted. Not diaphoretic. No erythema. No pallor. Psychiatric: Normal mood and affect. Behavior, judgment, thought content  normal.   Data Review:   CBC Recent Labs  Lab 12/27/18 0206  WBC 18.9*  HGB 7.8*  HCT 22.1*  PLT 318  MCV 97.4  MCH 34.4*  MCHC 35.3  RDW 20.5*  LYMPHSABS 8.6*  MONOABS 1.6*  EOSABS 0.0  BASOSABS 0.1   ------------------------------------------------------------------------------------------------------------------  Chemistries  Recent Labs  Lab 12/27/18 0206  NA 138  K 3.4*  CL 104  CO2 27  GLUCOSE 103*  BUN 9  CREATININE 0.48  CALCIUM 9.0  AST 28  ALT 13  ALKPHOS 41  BILITOT 5.4*   ------------------------------------------------------------------------------------------------------------------ estimated creatinine clearance is 87.2 mL/min (by C-G formula based on SCr of 0.48 mg/dL). ------------------------------------------------------------------------------------------------------------------ No results for input(s): TSH, T4TOTAL, T3FREE, THYROIDAB in the last 72 hours.  Invalid input(s): FREET3  Coagulation profile No results for input(s): INR, PROTIME in the last 168 hours. ------------------------------------------------------------------------------------------------------------------- No results for input(s): DDIMER in the last 72 hours. -------------------------------------------------------------------------------------------------------------------  Cardiac Enzymes No results for input(s): CKMB, TROPONINI, MYOGLOBIN in the last 168 hours.  Invalid input(s): CK ------------------------------------------------------------------------------------------------------------------ No results found for: BNP  ---------------------------------------------------------------------------------------------------------------  Urinalysis    Component Value  Date/Time   COLORURINE YELLOW (A) 12/27/2018 0209   APPEARANCEUR CLEAR (A) 12/27/2018 0209   LABSPEC 1.009 12/27/2018 0209   PHURINE 8.0 12/27/2018 0209   GLUCOSEU NEGATIVE 12/27/2018 0209   HGBUR  NEGATIVE 12/27/2018 0209   BILIRUBINUR NEGATIVE 12/27/2018 0209   KETONESUR NEGATIVE 12/27/2018 0209   PROTEINUR NEGATIVE 12/27/2018 0209   NITRITE NEGATIVE 12/27/2018 0209   LEUKOCYTESUR NEGATIVE 12/27/2018 0209    ----------------------------------------------------------------------------------------------------------------   Imaging Results:    Dg Chest Portable 1 View  Result Date: 12/27/2018 CLINICAL DATA:  Cough EXAM: PORTABLE CHEST 1 VIEW COMPARISON:  02/08/2018 FINDINGS: Mild cardiomegaly. Normal mediastinal contours. There is no edema, consolidation, effusion, or pneumothorax. IMPRESSION: No evidence of acute disease. Electronically Signed   By: Marnee Spring M.D.   On: 12/27/2018 06:47       Assessment & Plan:  Active Problems:   Sickle cell pain crisis (HCC)   Leukocytosis   Chronic pain syndrome   Anemia of chronic disease   Sickle cell anemia with pain crisis Admit, start IVF D5 .45% Saline @ 75 ml/hour,   Weight based Dilaudid PCA started within 30 minutes of admission,  IV Toradol 30 mg Q 6 H, Monitor vitals very closely  Re-evaluate pain scale regularly, 2 L of Oxygen by [place], Patient will be re-evaluated for pain in the context of function and relationship to baseline as care progresses. She still be Hemoglobin 7.8.  No blood transfusion warranted at this time.  Will refrain from transfusion  unless hemoglobin is less than 6.0 g/dL.  Leukocytosis:   WBCs 18.9, no signs of infection.  Suspected to be reactive.  Follow urinalysis and urine culture CBC in a.m.  Sickle cell anemia: Continue folic acid 1 mg  Chronic pain syndrome: Continue OxyContin 10 mg every 12 hours    DVT Prophylaxis: SCDs  AM Labs Ordered, also please review Full Orders  Family Communication: Admission, patient's condition and plan of care including tests being ordered have been discussed with the patient who indicate understanding and agree with the plan and Code Status.  Code  Status: Full Code  Consults called: None    Admission status: Inpatient    Time spent in minutes : 30 minutes  Nolon Nations  APRN, MSN, FNP-C Patient Care [facility] [address] [place], [place] [address] [phone]  12/27/2018 at 3:50 PM

## 2018-12-27 NOTE — ED Notes (Addendum)
Pt given meal tray. Heat turned up. Reports pain is better.

## 2018-12-27 NOTE — ED Triage Notes (Signed)
Pt reports she is in sickle cell crisis since Tuesday. Pt reports she has been off medications x4 months. Pt yelling in triage. Pt demanding water. Pt sts, "Put an IV in me now."

## 2018-12-27 NOTE — ED Notes (Signed)
Pt in restroom with this RN to provide urine before pain medication administration.

## 2018-12-27 NOTE — ED Notes (Signed)
Report given to [facility] with [facility]

## 2018-12-27 NOTE — ED Provider Notes (Signed)
[facility] Emergency Department Provider Note  ____________________________________________   First MD Initiated Contact with Patient 12/27/18 [phone]     (approximate)  I have reviewed the triage vital signs and the nursing notes.   HISTORY  Chief Complaint Sickle Cell Pain Crisis   HPI Kara [name] is a 33 y.o. female with history of sickle cell anemia pulmonary hypertension and arthritis presents to the emergency department with back knee and elbow pain that is currently 10 out of 10 which patient states is been occurring since Tuesday.  Patient denies any recent illness no fever.  Patient denies any chest pain no shortness of breath.  Patient states that symptoms are consistent with previous sickle cell pain crisis.   Past Medical History:  Diagnosis Date  . Arthritis   . Pulmonary hypertension (HCC)   . Sickle cell anemia [facility])     Patient Active Problem List   Diagnosis Date Noted  . Pulmonary hypertension (HCC) 04/04/2018  . Sickle cell disease (HCC) 04/04/2018  . Arthritis 04/04/2018  . Chest pain on breathing   . Leucocytosis 02/06/2018  . Sickle cell crisis (HCC) 02/05/2018  . Sickle cell pain crisis (HCC) 10/29/2017    Past Surgical History:  Procedure Laterality Date  . DILATION AND CURETTAGE OF UTERUS    . GALLBLADDER SURGERY      Prior to Admission medications   Medication Sig Start Date End Date Taking? Authorizing Provider  ferrous sulfate 325 (65 FE) MG tablet Take 1 tablet (325 mg total) by mouth daily with breakfast. 04/04/18   Tukov-Yual, Alroy Bailiff, NP  folic acid (FOLVITE) 1 MG tablet Take 1 tablet (1 mg total) by mouth daily. 04/04/18   Tukov-Yual, Alroy Bailiff, NP  IBUPROFEN PO Take 500 mg by mouth every 8 (eight) hours as needed.     [provider]  loratadine (CLARITIN) 10 MG tablet Take 1 tablet (10 mg total) by mouth daily. 04/04/18   Tukov-Yual, Alroy Bailiff, NP  methocarbamol (ROBAXIN) 500 MG tablet Take 1  tablet (500 mg total) by mouth 4 (four) times daily. 04/16/18   Sharman Cheek, MD  naproxen (NAPROSYN) 500 MG tablet Take 1 tablet (500 mg total) by mouth 3 (three) times daily with meals as needed. 04/04/18   Tukov-Yual, Alroy Bailiff, NP  oxyCODONE (OXYCONTIN) 10 mg 12 hr tablet Take 10 mg by mouth every 12 (twelve) hours.    [provider]  oxyCODONE (ROXICODONE) 5 MG immediate release tablet Take 1 tablet (5 mg total) by mouth every 6 (six) hours as needed for breakthrough pain. 04/16/18   Sharman Cheek, MD  penicillin v potassium (VEETID) 250 MG tablet Take 2 tablets (500 mg total) by mouth 2 (two) times daily. 04/04/18   Tukov-Yual, Alroy Bailiff, NP  predniSONE (DELTASONE) 50 MG tablet Take 1 tablet (50 mg total) by mouth daily with breakfast. 12/22/18   Cuthriell, Delorise Royals, PA-C  ranitidine (ZANTAC) 75 MG tablet Take 2 tablets (150 mg total) by mouth 2 (two) times daily as needed for heartburn. Take with naproxen with zantac 04/04/18   Tukov-Yual, Alroy Bailiff, NP  vitamin B-12 (CYANOCOBALAMIN) 100 MCG tablet Take 1 tablet (100 mcg total) by mouth daily. 04/04/18   Tukov-Yual, Alroy Bailiff, NP  Vitamin D, Ergocalciferol, (DRISDOL) 50000 units CAPS capsule Take 1 capsule (50,000 Units total) by mouth every 7 (seven) days. 04/04/18   Andreas Ohm, NP    Allergies Kiwi extract; Morphine and related; and Paroxetine  Family History  Adopted:  Yes    Social History Social History   Tobacco Use  . Smoking status: Current Every Day Smoker  . Smokeless tobacco: Never Used  Substance Use Topics  . Alcohol use: No    Frequency: Never  . Drug use: Not on file    Review of Systems Constitutional: No fever/chills Eyes: No visual changes. ENT: No sore throat. Cardiovascular: Denies chest pain. Respiratory: Denies shortness of breath. Gastrointestinal: No abdominal pain.  No nausea, no vomiting.  No diarrhea.  No constipation. Genitourinary: Negative for  dysuria. Musculoskeletal: Positive for diffuse back bilateral knees and elbow pain Integumentary: Negative for rash. Neurological: Negative for headaches, focal weakness or numbness.   ____________________________________________   PHYSICAL EXAM:  VITAL SIGNS: ED Triage Vitals  Enc Vitals Group     BP 12/27/18 0151 131/89     Pulse Rate 12/27/18 0151 (!) 106     Resp 12/27/18 0151 (!) 22     Temp 12/27/18 0151 98.2 F (36.8 C)     Temp Source 12/27/18 0151 Oral     SpO2 12/27/18 0151 100 %     Weight --      Height --      Head Circumference --      Peak Flow --      Pain Score 12/27/18 0531 9     Pain Loc --      Pain Edu? --      Excl. in GC? --     Constitutional: Alert and oriented.  Apparent discomfort  eyes: Conjunctivae are normal.  Mouth/Throat: Mucous membranes are moist.  Oropharynx non-erythematous. Neck: No stridor.  Cardiovascular: Normal rate, regular rhythm. Good peripheral circulation. Grossly normal heart sounds. Respiratory: Normal respiratory effort.  No retractions. Lungs CTAB. Gastrointestinal: Soft and nontender. No distention.  Musculoskeletal: No lower extremity tenderness nor edema. No gross deformities of extremities. Neurologic:  Normal speech and language. No gross focal neurologic deficits are appreciated.  Skin:  Skin is warm, dry and intact. No rash noted. Psychiatric: Mood and affect are normal. Speech and behavior are normal.  ____________________________________________   LABS (all labs ordered are listed, but only abnormal results are displayed)  Labs Reviewed  COMPREHENSIVE METABOLIC PANEL - Abnormal; Notable for the following components:      Result Value   Potassium 3.4 (*)    Glucose, Bld 103 (*)    Total Bilirubin 5.4 (*)    All other components within normal limits  CBC WITH DIFFERENTIAL/PLATELET - Abnormal; Notable for the following components:   WBC 18.9 (*)    RBC 2.27 (*)    Hemoglobin 7.8 (*)    HCT 22.1 (*)     MCH 34.4 (*)    RDW 20.5 (*)    nRBC 1.1 (*)    Neutro Abs 8.3 (*)    Lymphs Abs 8.6 (*)    Monocytes Absolute 1.6 (*)    Abs Immature Granulocytes 0.39 (*)    All other components within normal limits  RETICULOCYTES - Abnormal; Notable for the following components:   Retic Ct Pct 16.6 (*)    RBC. 2.27 (*)    Retic Count, Absolute 377.0 (*)    Immature Retic Fract 35.8 (*)    All other components within normal limits  URINALYSIS, COMPLETE (UACMP) WITH MICROSCOPIC - Abnormal; Notable for the following components:   Color, Urine YELLOW (*)    APPearance CLEAR (*)    Bacteria, UA RARE (*)    All other components within normal limits  URINE DRUG SCREEN, QUALITATIVE ([facility] ONLY)  PATHOLOGIST SMEAR REVIEW  POC URINE PREG, ED  POCT PREGNANCY, URINE   _________________________   INITIAL IMPRESSION / ASSESSMENT AND PLAN / ED COURSE  As part of my medical decision making, I reviewed the following data within the electronic MEDICAL RECORD NUMBER   33 year old female presented with above-stated history and physical exam consistent with acute sickle cell pain crisis with no evidence of acute chest.  Patient given IV Dilaudid 1 g with minimal pain improvement and as such patient was given IV fentanyl 100 mcg that she states that fentanyl works better for her pain.  Patient was also given 1 L IV normal saline 3 L nasal cannula oxygen applied.  Patient [facility] with [facility] Ton[facility] for [facility] for further management of sickle cell pain crisis. ____________________________________________  FINAL CLINICAL IMPRESSION(S) / ED DIAGNOSES  Final diagnoses:  Sickle cell pain crisis (HCC)     MEDICATIONS GIVEN DURING THIS VISIT:  Medications  fentaNYL (SUBLIMAZE) injection 100 mcg (has no administration in time range)  sodium chloride flush (NS) 0.9 % injection 3 mL (3 mLs Intravenous Given 12/27/18 0531)  oxyCODONE-acetaminophen (PERCOCET/ROXICET) 5-325 MG per tablet 2 tablet  ( Oral Not Given 12/27/18 0531)  sodium chloride 0.9 % bolus 1,000 mL (1,000 mLs Intravenous New Bag/Given 12/27/18 0529)  sodium chloride 0.9 % bolus 1,000 mL (1,000 mLs Intravenous New Bag/Given 12/27/18 0528)  HYDROmorphone (DILAUDID) injection 1 mg (1 mg Intravenous Given 12/27/18 0524)     ED Discharge Orders    None       Note:  This document was prepared using Dragon voice recognition software and may include unintentional dictation errors.   Darci CurrentBrown, Horton N, MD 12/27/18 [phone]

## 2018-12-27 NOTE — ED Notes (Signed)
Pt wakes easy upon RN entering room.  Informed pt meal tray ordered. NAD.  Waiting on transfer bed to [place].  No needs at this time. VSS.

## 2018-12-27 NOTE — ED Notes (Signed)
emtala reviewed by this RN 

## 2018-12-27 NOTE — ED Notes (Signed)
Pt left with [facility] to Kara [name]. Pt alert and oriented, stable on discharge

## 2018-12-28 LAB — BASIC METABOLIC PANEL
Anion gap: 6 (ref 5–15)
BUN: 5 mg/dL — ABNORMAL LOW (ref 6–20)
CALCIUM: 8.3 mg/dL — AB (ref 8.9–10.3)
CO2: 29 mmol/L (ref 22–32)
Chloride: 103 mmol/L (ref 98–111)
Creatinine, Ser: 0.4 mg/dL — ABNORMAL LOW (ref 0.44–1.00)
GFR calc Af Amer: >60 mL/min (ref >60)
GFR calc non Af Amer: >60 mL/min (ref >60)
GLUCOSE: 100 mg/dL — AB (ref 70–99)
Potassium: 2.8 mmol/L — ABNORMAL LOW (ref 3.5–5.1)
Sodium: 138 mmol/L (ref 135–145)

## 2018-12-28 LAB — CBC
HCT: 18.6 % — ABNORMAL LOW (ref 36.0–46.0)
Hemoglobin: 6.4 g/dL — CL (ref 12.0–15.0)
MCH: 34.2 pg — ABNORMAL HIGH (ref 26.0–34.0)
MCHC: 34.4 g/dL (ref 30.0–36.0)
MCV: 99.5 fL (ref 80.0–100.0)
Platelets: 320 10*3/uL (ref 150–400)
RBC: 1.87 MIL/uL — ABNORMAL LOW (ref 3.87–5.11)
RDW: 20.1 % — AB (ref 11.5–15.5)
WBC: 23.4 10*3/uL — ABNORMAL HIGH (ref 4.0–10.5)
nRBC: 2.4 % — ABNORMAL HIGH (ref 0.0–0.2)

## 2018-12-28 MED ORDER — HYDROXYZINE HCL 25 MG PO TABS
25.0000 mg | ORAL_TABLET | Freq: Three times a day (TID) | ORAL | Status: DC | PRN
  Administered 2018-12-28: 25 mg via ORAL
  Filled 2018-12-28 (×3): qty 1

## 2018-12-28 NOTE — Progress Notes (Signed)
CRITICAL VALUE ALERT  Critical Value:   Hgb 6.4  Date & Time Notied: 12/28/18 0655  Provider Notified: [facility]  Orders Received/Actions taken:  MD aware, no new order at this time.

## 2018-12-28 NOTE — Progress Notes (Signed)
Subjective: 33 year old female with sickle cell disease admitted with sickle cell painful crisis.  Patient complaining of 9 out of 10 pain in her legs and back.  She is not using much of the Dilaudid PCA and sleeps a lot.  Patient has had decrease in her hemoglobin from 7.8-6.4.  Also leukocytosis.  No clear evidence of hemolysis at this point.  She was able to walk to the bathroom.  Objective: Vital signs in last 24 hours: Temp:  [98.2 F (36.8 C)-99.5 F (37.5 C)] 99.4 F (37.4 C) (02/08 0930) Pulse Rate:  [87-111] 94 (02/08 0930) Resp:  [8-20] 8 (02/08 1133) BP: (93-111)/(52-75) 105/58 (02/08 0930) SpO2:  [90 %-100 %] 98 % (02/08 1133) Weight:  [57 kg-57.3 kg] 57.3 kg (02/08 0400) Weight change:  Last BM Date: 12/27/18  Intake/Output from previous day: No intake/output data recorded. Intake/Output this shift: No intake/output data recorded.  General appearance: alert, cooperative, appears stated age and no distress Neck: no adenopathy, no carotid bruit, no JVD, supple, symmetrical, trachea midline and thyroid not enlarged, symmetric, no tenderness/mass/nodules Back: symmetric, no curvature. ROM normal. No CVA tenderness. Resp: clear to auscultation bilaterally Cardio: regular rate and rhythm, S1, S2 normal, no murmur, click, rub or gallop GI: soft, non-tender; bowel sounds normal; no masses,  no organomegaly Extremities: extremities normal, atraumatic, no cyanosis or edema Pulses: 2+ and symmetric Skin: Skin color, texture, turgor normal. No rashes or lesions Neurologic: Grossly normal  Lab Results: Recent Labs    12/27/18 0206 12/28/18 0507  WBC 18.9* 23.4*  HGB 7.8* 6.4*  HCT 22.1* 18.6*  PLT 318 320   BMET Recent Labs    12/27/18 0206 12/28/18 0634  NA 138 138  K 3.4* 2.8*  CL 104 103  CO2 27 29  GLUCOSE 103* 100*  BUN 9 5*  CREATININE 0.48 0.40*  CALCIUM 9.0 8.3*    Studies/Results: Dg Chest Portable 1 View  Result Date: 12/27/2018 CLINICAL DATA:   Cough EXAM: PORTABLE CHEST 1 VIEW COMPARISON:  02/08/2018 FINDINGS: Mild cardiomegaly. Normal mediastinal contours. There is no edema, consolidation, effusion, or pneumothorax. IMPRESSION: No evidence of acute disease. Electronically Signed   By: Marnee Spring M.D.   On: 12/27/2018 06:47    Medications: I have reviewed the patient's current medications.  Assessment/Plan: A 33 year old female admitted with sickle cell painful crisis.  #1 sickle cell painful crisis: Patient will be maintained on Dilaudid PCA.  Also Toradol.  And IV fluids. She is relatively opiate nave at home.  Only taking oxycodone.  Continue to monitor.  #2 sickle cell anemia: Hemoglobin again has dropped.  We will monitor and consider transfusion if hemoglobin drops further.  #3 leukocytosis: White count has jumped to 23,000.  We will recheck in the morning.  #4 hypokalemia: Continue to replete potassium.    LOS: 1 day   Elora Wolter,LAWAL 12/28/2018, 12:20 PM

## 2018-12-29 MED ORDER — FAMOTIDINE 20 MG PO TABS
20.0000 mg | ORAL_TABLET | Freq: Two times a day (BID) | ORAL | Status: DC
  Administered 2018-12-29 – 2018-12-30 (×2): 20 mg via ORAL
  Filled 2018-12-29 (×3): qty 1

## 2018-12-29 MED ORDER — OXYCODONE HCL 5 MG PO TABS
5.0000 mg | ORAL_TABLET | ORAL | Status: DC
  Administered 2018-12-29 – 2018-12-30 (×4): 5 mg via ORAL
  Filled 2018-12-29 (×6): qty 1

## 2018-12-29 NOTE — Progress Notes (Signed)
Subjective: Patient continues to have pain at 8 out of 10.  She has not been using much of the Dilaudid in the last 24 hours.  She has been sleeping most of the time.  So far she used 80 mg in the last 24 hours.  Pain is in her back and legs.  Associated with weakness.  Patient says she has been sleeping a lot that is why she has not been using the PCA.  Objective: Vital signs in last 24 hours: Temp:  [98.4 F (36.9 C)-99.7 F (37.6 C)] 99.6 F (37.6 C) (02/09 1803) Pulse Rate:  [90-105] 93 (02/09 1803) Resp:  [12-18] 16 (02/09 1803) BP: (104-112)/(60-72) 112/61 (02/09 1803) SpO2:  [92 %-100 %] 98 % (02/09 1803) Weight:  [57.3 kg] 57.3 kg (02/09 0352) Weight change: 0.283 kg Last BM Date: 12/27/18  Intake/Output from previous day: 02/08 0701 - 02/09 0700 In: 2678.5 [P.O.:120; I.V.:2558.5] Out: -  Intake/Output this shift: Total I/O In: 240 [P.O.:240] Out: -   General appearance: alert, cooperative, appears stated age and no distress Neck: no adenopathy, no carotid bruit, no JVD, supple, symmetrical, trachea midline and thyroid not enlarged, symmetric, no tenderness/mass/nodules Back: symmetric, no curvature. ROM normal. No CVA tenderness. Resp: clear to auscultation bilaterally Cardio: regular rate and rhythm, S1, S2 normal, no murmur, click, rub or gallop GI: soft, non-tender; bowel sounds normal; no masses,  no organomegaly Extremities: extremities normal, atraumatic, no cyanosis or edema Pulses: 2+ and symmetric Skin: Skin color, texture, turgor normal. No rashes or lesions Neurologic: Grossly normal  Lab Results: Recent Labs    12/27/18 0206 12/28/18 0507  WBC 18.9* 23.4*  HGB 7.8* 6.4*  HCT 22.1* 18.6*  PLT 318 320   BMET Recent Labs    12/27/18 0206 12/28/18 0634  NA 138 138  K 3.4* 2.8*  CL 104 103  CO2 27 29  GLUCOSE 103* 100*  BUN 9 5*  CREATININE 0.48 0.40*  CALCIUM 9.0 8.3*    Studies/Results: No results found.  Medications: I have reviewed  the patient's current medications.  Assessment/Plan: A 33 year old female admitted with sickle cell painful crisis.  #1 sickle cell painful crisis: Patient will be maintained on Dilaudid PCA.  Also Toradol.  I will add oral oxycodone the 5 mg every 4 hours since she is not able to get the Dilaudid PCA as ordered.  #2 sickle cell anemia: Hemoglobin again has dropped.  We will monitor and consider transfusion if hemoglobin drops further.  #3 leukocytosis: We will recheck in the morning.  #4 hypokalemia: Potassium has been repleted yesterday but we will recheck again.  Continue to replete potassium.    LOS: 2 days   ,LAWAL 12/29/2018, 7:39 PM

## 2018-12-30 DIAGNOSIS — D5700 Hb-SS disease with crisis, unspecified: Principal | ICD-10-CM

## 2018-12-30 LAB — COMPREHENSIVE METABOLIC PANEL
ALT: 14 U/L (ref 0–44)
AST: 24 U/L (ref 15–41)
Albumin: 3.4 g/dL — ABNORMAL LOW (ref 3.5–5.0)
Alkaline Phosphatase: 38 U/L (ref 38–126)
Anion gap: 7 (ref 5–15)
BUN: 5 mg/dL — ABNORMAL LOW (ref 6–20)
CO2: 31 mmol/L (ref 22–32)
Calcium: 8.4 mg/dL — ABNORMAL LOW (ref 8.9–10.3)
Chloride: 100 mmol/L (ref 98–111)
Creatinine, Ser: 0.44 mg/dL (ref 0.44–1.00)
GFR calc non Af Amer: >60 mL/min (ref >60)
Glucose, Bld: 129 mg/dL — ABNORMAL HIGH (ref 70–99)
Potassium: 2.8 mmol/L — ABNORMAL LOW (ref 3.5–5.1)
SODIUM: 138 mmol/L (ref 135–145)
Total Bilirubin: 2.8 mg/dL — ABNORMAL HIGH (ref 0.3–1.2)
Total Protein: 6.3 g/dL — ABNORMAL LOW (ref 6.5–8.1)

## 2018-12-30 LAB — CBC WITH DIFFERENTIAL/PLATELET
Abs Immature Granulocytes: 0.18 10*3/uL — ABNORMAL HIGH (ref 0.00–0.07)
Basophils Absolute: 0.1 10*3/uL (ref 0.0–0.1)
Basophils Relative: 0 %
Eosinophils Absolute: 0.4 10*3/uL (ref 0.0–0.5)
Eosinophils Relative: 3 %
HCT: 17.4 % — ABNORMAL LOW (ref 36.0–46.0)
Hemoglobin: 5.9 g/dL — CL (ref 12.0–15.0)
Immature Granulocytes: 1 %
Lymphocytes Relative: 52 %
Lymphs Abs: 8.2 10*3/uL — ABNORMAL HIGH (ref 0.7–4.0)
MCH: 34.5 pg — AB (ref 26.0–34.0)
MCHC: 33.9 g/dL (ref 30.0–36.0)
MCV: 101.8 fL — ABNORMAL HIGH (ref 80.0–100.0)
Monocytes Absolute: 1.3 10*3/uL — ABNORMAL HIGH (ref 0.1–1.0)
Monocytes Relative: 8 %
Neutro Abs: 5.8 10*3/uL (ref 1.7–7.7)
Neutrophils Relative %: 36 %
Platelets: 288 10*3/uL (ref 150–400)
RBC: 1.71 MIL/uL — ABNORMAL LOW (ref 3.87–5.11)
RDW: 22.6 % — ABNORMAL HIGH (ref 11.5–15.5)
WBC: 15.8 10*3/uL — ABNORMAL HIGH (ref 4.0–10.5)
nRBC: 3.7 % — ABNORMAL HIGH (ref 0.0–0.2)

## 2018-12-30 LAB — PREPARE RBC (CROSSMATCH)

## 2018-12-30 LAB — HEMOGLOBIN AND HEMATOCRIT, BLOOD
HCT: 17 % — ABNORMAL LOW (ref 36.0–46.0)
Hemoglobin: 5.8 g/dL — CL (ref 12.0–15.0)

## 2018-12-30 MED ORDER — HALOPERIDOL LACTATE 5 MG/ML IJ SOLN
2.0000 mg | Freq: Four times a day (QID) | INTRAMUSCULAR | Status: DC | PRN
  Administered 2018-12-30: 2 mg via INTRAVENOUS
  Filled 2018-12-30: qty 1

## 2018-12-30 MED ORDER — ACETAMINOPHEN 325 MG PO TABS
650.0000 mg | ORAL_TABLET | Freq: Once | ORAL | Status: AC
  Administered 2018-12-30: 650 mg via ORAL
  Filled 2018-12-30: qty 2

## 2018-12-30 MED ORDER — DIPHENHYDRAMINE HCL 50 MG/ML IJ SOLN: 12.5000 mg | Freq: Once | INTRAMUSCULAR | Status: DC

## 2018-12-30 MED ORDER — POTASSIUM CHLORIDE CRYS ER 20 MEQ PO TBCR
40.0000 meq | EXTENDED_RELEASE_TABLET | Freq: Once | ORAL | Status: AC
  Administered 2018-12-30: 40 meq via ORAL
  Filled 2018-12-30: qty 2

## 2018-12-30 NOTE — Progress Notes (Signed)
RN called into room due to patient concerns.  Upon conversing with patient, patient expressed that she does not want to have a blood transfusion at this time, nor does she want to continue the PCA pain medication and fluids. Patient expressed she is very anxious and RN offered and pt accepted prn medication, along with scheduled oral pain medication and IV Toradol to assist with pain control.  RN spoke at length with patient regarding the reasons for a blood transfusion including benefits and risks.  RN also discussed the current plan of care for the patient.  Patient expresses understanding but remains adamant on her wishes. Patient voices wish to be discharged home tomorrow despite low hemoglobin level which RN explained.  Nevertheless patient maintains that she is stable enough to go home tomorrow.

## 2018-12-30 NOTE — Progress Notes (Signed)
NT called for my assistance in Mrs. Hofland room about an hour ago. At this time I was arranging to start blood on another patient. She needed assistance to the bathroom, but did not want help from [place] (the NT).  Patient, when asked, reported that the NT did not know how to get her out of the bed and that she was going to make her fall.  I assured the patient that Otila Back has worked as a NT for several years, and was more than capable of taking her to the bathroom.  I spoke with the patient, and told her that if I was busy in another patient room, then the NT would need to help my patients to the bathroom.  Patient asked if I could take her to the bathroom while I was in the room.  I reminded her that I had an urgent task in another room, and that the NT was readily available to assist her to the bathroom.  Patient refused. NT and I suggested bedpan and BSC assistance since she was not comfortable ambulating with the tech to the bathroom.  Patient refused these options aswell.  Patient became argumentative and said that she would just mess the bed, and further noted that we would have to clean her up.  I reiterated to patient that we could help her, and soiling herself was not necessary.  I then suggested to the patient that both of [place] could help her to the bathroom, so that we could learn how to assist her better.  She refused again.  She wanted to go back to sleep instead.  Bed alarm was placed in case patient attempted to get OOB herself.  NT and I told patient that we would both assist her to bathroom when ready to try again.  Patient, still angry, refused to acknowledge [place] and also refused her scheduled pain medication.Marland KitchenMarland KitchenPriscille Kluver

## 2018-12-30 NOTE — Progress Notes (Signed)
Patient complains of itching on both arms, across the abdomen, and under the breast - she does have some hives that are red - gave benadyl, no relief - will try atarax.

## 2018-12-30 NOTE — Progress Notes (Addendum)
Subjective: Kara [name], a 33 year old female with a medical history significant for sickle cell anemia, chronic pain syndrome, pulmonary hypertension, generalized anxiety, and depression was admitted and sickle cell pain crisis. Patient's hemoglobin has decreased to 5.9,.  Patient endorses shortness of breath.  Oxygen saturation 100% on RA. Patient states that pain intensity is 7/10 primarily to low back and lower extremities.  Patient characterizes pain as intermittent and throbbing.  She denies chest pain, heart palpitations, abdominal pain, dysuria, nausea, vomiting, or diarrhea. Patient afebrile.   Objective:  Vital signs in last 24 hours:  Vitals:   12/30/18 0032 12/30/18 0526 12/30/18 0557 12/30/18 1005  BP: 112/74  117/65 108/71  Pulse: 86  84 75  Resp: 20 11 20 15   Temp: 99.2 F (37.3 C)  98.6 F (37 C) 98.3 F (36.8 C)  TempSrc: Oral  Oral Oral  SpO2: 100% 100% 100% 100%  Weight:   56.8 kg   Height:        Intake/Output from previous day:   Intake/Output Summary (Last 24 hours) at 12/30/2018 1050 Last data filed at 12/30/2018 0141 Gross per 24 hour  Intake 1884.84 ml  Output -  Net 1884.84 ml   Physical Exam Constitutional:      Appearance: Normal appearance. She is normal weight.  HENT:     Nose: Nose normal.     Mouth/Throat:     Mouth: Mucous membranes are moist.  Eyes:     Pupils: Pupils are equal, round, and reactive to light.  Neck:     Musculoskeletal: Normal range of motion.  Cardiovascular:     Rate and Rhythm: Normal rate and regular rhythm.  Pulmonary:     Effort: Pulmonary effort is normal. No respiratory distress.     Breath sounds: Normal breath sounds. No wheezing.  Abdominal:     General: Abdomen is flat. Bowel sounds are normal.     Palpations: Abdomen is soft.  Musculoskeletal: Normal range of motion.  Skin:    General: Skin is warm and dry.  Neurological:     General: No focal deficit present.     Mental Status: She is alert.  Mental status is at baseline.  Psychiatric:        Attention and Perception: She does not perceive auditory or visual hallucinations.        Mood and Affect: Mood is anxious. Affect is angry.        Behavior: Behavior is agitated, aggressive and combative.        Thought Content: Thought content is paranoid.     Lab Results:  Basic Metabolic Panel:    Component Value Date/Time   NA 138 12/30/2018 0524   K 2.8 (L) 12/30/2018 0524   CL 100 12/30/2018 0524   CO2 31 12/30/2018 0524   BUN 5 (L) 12/30/2018 0524   CREATININE 0.44 12/30/2018 0524   GLUCOSE 129 (H) 12/30/2018 0524   CALCIUM 8.4 (L) 12/30/2018 0524   CBC:    Component Value Date/Time   WBC 15.8 (H) 12/30/2018 0524   HGB 5.9 (LL) 12/30/2018 0524   HCT 17.4 (L) 12/30/2018 0524   PLT 288 12/30/2018 0524   MCV 101.8 (H) 12/30/2018 0524   NEUTROABS 5.8 12/30/2018 0524   LYMPHSABS 8.2 (H) 12/30/2018 0524   MONOABS 1.3 (H) 12/30/2018 0524   EOSABS 0.4 12/30/2018 0524   BASOSABS 0.1 12/30/2018 0524    No results found for this or any previous visit (from the past 240 hour(s)).  Studies/Results:  No results found.  Medications: Scheduled Meds: . famotidine  20 mg Oral BID  . HYDROmorphone   Intravenous Q4H  . ketorolac  15 mg Intravenous Q6H  . oxyCODONE  5 mg Oral Q4H  . potassium chloride  40 mEq Oral Once  . senna-docusate  1 tablet Oral BID   Continuous Infusions: . dextrose 5 % and 0.45% NaCl 75 mL/hr at 12/30/18 0555  . diphenhydrAMINE     PRN Meds:.diphenhydrAMINE **OR** diphenhydrAMINE, hydrOXYzine, naloxone **AND** sodium chloride flush, ondansetron (ZOFRAN) IV, polyethylene glycol   Procedures:  Transfuse 1 unit of packed red blood cells  Assessment/Plan: Active Problems:   Sickle cell pain crisis (HCC)   Leukocytosis   Chronic pain syndrome   Anemia of chronic disease  Sickle cell anemia with pain crisis: Continue weight-based Dilaudid PCA Oxycodone 5 mg every 4 hours while  awake Toradol 15 mg every 6 hours Maintain oxygen saturation above 90%  Anemia of chronic disease: Hemoglobin is dropped to 5.9.  Will transfuse 1 unit of packed red blood cells today. Follow CBC  Leukocytosis: WBCs have improved to 15.8.  Patient afebrile.  No signs of infection. CBC in a.m.  Hypokalemia: Potassium 2.8 today.  Repleted. BMP in a.m.  Agitation/generalized anxiety:  Patient is visibly anxious and argumentative.  Haldol 2 mg every 6 hours as needed for agitation Psychiatry consult for further evaluation    Code Status: Full Code Family Communication: N/A Disposition Plan: Not yet ready for discharge   Nolon Nations  APRN, MSN, FNP-C [name] [address] [place], [place] [address] [phone]  If 5PM-7AM, please contact night-coverage.  12/30/2018, 10:50 AM  LOS: 3 days

## 2018-12-31 LAB — HEMOGLOBIN AND HEMATOCRIT, BLOOD
HCT: 22.1 % — ABNORMAL LOW (ref 36.0–46.0)
Hemoglobin: 7.5 g/dL — ABNORMAL LOW (ref 12.0–15.0)

## 2018-12-31 MED ORDER — NAPROXEN 500 MG PO TABS: 500.0000 mg | ORAL_TABLET | Freq: Three times a day (TID) | ORAL | 3 refills | Status: DC | PRN

## 2018-12-31 MED ORDER — FOLIC ACID 1 MG PO TABS: 1.0000 mg | ORAL_TABLET | Freq: Every day | ORAL | 3 refills | Status: DC

## 2018-12-31 MED ORDER — METHOCARBAMOL 500 MG PO TABS: 500.0000 mg | ORAL_TABLET | Freq: Four times a day (QID) | ORAL | 0 refills | Status: DC

## 2018-12-31 MED ORDER — OXYCODONE HCL 5 MG PO TABS: 5.0000 mg | ORAL_TABLET | Freq: Four times a day (QID) | ORAL | 0 refills | Status: DC | PRN

## 2018-12-31 NOTE — Progress Notes (Signed)
Discharge instructions were given, there were no immediate questions or concerns at this time. The patient will be taken downstairs via wheelchair.

## 2018-12-31 NOTE — Discharge Summary (Signed)
Physician Discharge Summary  Kara [name] ZOX:[id number] DOB: 18-Apr-1986 DOA: 12/27/2018  PCP: Patient, No Pcp Per  Admit date: 12/27/2018  Discharge date: 12/31/2018  Discharge Diagnoses:  Active Problems:   Sickle cell pain crisis (HCC)   Leukocytosis   Chronic pain syndrome   Anemia of chronic disease   Discharge Condition: Stable  Disposition:  Follow-up Information    [facility] Follow up in 1 week(s).   Specialty:  General Practice Contact information: [address] [place] [address] [phone]          Pt is discharged home in good condition and is to follow up with PCP this week to have labs evaluated. Kara [name] is instructed to increase activity slowly and balance with rest for the next few days, and use prescribed medication to complete treatment of pain  Diet: Regular Wt Readings from Last 3 Encounters:  12/31/18 54.9 kg  12/22/18 59 kg  04/16/18 56.7 kg    History of present illness:  Kara KirksSherry [name], a 33 year old female with a medical history significant for sickle cell anemia, chronic pain syndrome, pulmonary hypertension, anxiety and depression presented to the emergency department complaining of generalized pain consistent with typical sickle cell pain crisis.  Patient attributes current pain crisis to changes in weather and increased stressors.  Patient states that [place] greater than [place] ago from [place].  Patient states that she is not establish care with a hematologist or primary care provider due to transportation constraints.  Patient mostly uses the emergency department for primary care needs.  Patient [facility] for further management of acute sickle cell pain crisis.  Pain intensity 10/10 characterized as constant and throbbing.  Patient currently denies fever, chills, or persistent cough.  Patient also denies headache,  chest pain, abdominal pain, dysuria, nausea, vomiting, or diarrhea.  ER course: WBCs 18.9, hemoglobin 7.8, potassium 3.4.  Patient chest x-ray shows no evidence of acute cardiopulmonary process.  Pain intensity continues despite IV Dilaudid, IV fluids, and IV Toradol patient admitted to MedSurg for sickle cell pain crisis.  Hospital Course:  Sickle cell anemia with pain crisis:  Patient was admitted for sickle cell pain crisis and managed appropriately with IVF, IV Dilaudid via PCA and IV Toradol, as well as other adjunct therapies per sickle cell pain management protocols.  Patient was unable to maintain safety devices for Dilaudid PCA, therefore was discontinued.  Continued oxycodone 5 mg every 4 hours while awake.  Pain intensity decreased appropriately throughout admission.  Prior to discharge pain intensity 5/10.  Patient alert, oriented, and ambulating without assistance.  Anemia of chronic disease: Patient's hemoglobin decreased to 5.9.  Patient was transfused 1 unit of packed red blood cells.  Hemoglobin improved to 7.5 prior to discharge.  Patient advised to follow-up in primary care in 1 week.  Patient given information to establish care with a local provider in [facility] [place].  Patient also followed by the [facility].  Kara RobinsonsMarsha [name] was able to discuss patient's transportation and housing constraints and will follow-up with her outpatient.  Generalized anxiety: Patient had bouts of generalized anxiety during admission.  Patient often became extremely agitated.  Psychiatric consult ordered, patient refused.  Haldol was continued through admission for increased agitation as needed. Recommend that patient establish care with PCP, she warrants referral for psychiatric evaluation and management.  Patient was discharged home today in a hemodynamically stable condition.  Discharge Exam: Vitals:   12/31/18 0945 12/31/18 1353  BP: 107/69 119/65  Pulse: 97 92   Resp: 16   Temp: 99 F (37.2 C) 98.7 F (37.1 C)  SpO2: 97% 96%   Vitals:   12/31/18 0303 12/31/18 0535 12/31/18 0945 12/31/18 1353  BP: (!) 97/52 110/61 107/69 119/65  Pulse: 94 93 97 92  Resp:  14 16   Temp: 100.1 F (37.8 C) 99 F (37.2 C) 99 F (37.2 C) 98.7 F (37.1 C)  TempSrc: Oral Oral Oral Oral  SpO2: 97% 92% 97% 96%  Weight:  54.9 kg    Height:        General appearance : Awake, alert, not in any distress. Speech Clear. Not toxic looking HEENT: Atraumatic and Normocephalic, pupils equally reactive to light and accomodation Neck: Supple, no JVD. No cervical lymphadenopathy.  Chest: Good air entry bilaterally, no added sounds  CVS: S1 S2 regular, no murmurs.  Abdomen: Bowel sounds present, Non tender and not distended with no gaurding, rigidity or rebound. Extremities: B/L Lower Ext shows no edema, both legs are warm to touch Neurology: Awake alert, and oriented X 3, CN II-XII intact, Non focal Skin: No Rash  Discharge Instructions  Discharge Instructions    Discharge patient   Complete by:  As directed    Discharge disposition:  01-Home or Self Care   Discharge patient date:  12/31/2018     Allergies as of 12/31/2018      Reactions   Kiwi Extract Hives   Morphine And Related Hives   Paroxetine Hives      Medication List    STOP taking these medications   ferrous sulfate 325 (65 FE) MG tablet   ibuprofen 200 MG tablet Commonly known as:  ADVIL,MOTRIN   penicillin v potassium 250 MG tablet Commonly known as:  VEETID   predniSONE 50 MG tablet Commonly known as:  DELTASONE   ranitidine 75 MG tablet Commonly known as:  ZANTAC   vitamin B-12 100 MCG tablet Commonly known as:  CYANOCOBALAMIN     TAKE these medications   acetaminophen 500 MG tablet Commonly known as:  TYLENOL Take 1,000 mg by mouth every 6 (six) hours as needed for mild pain.   folic acid 1 MG tablet Commonly known as:  FOLVITE Take 1 tablet (1 mg total) by mouth daily.    loratadine 10 MG tablet Commonly known as:  CLARITIN Take 1 tablet (10 mg total) by mouth daily.   methocarbamol 500 MG tablet Commonly known as:  ROBAXIN Take 1 tablet (500 mg total) by mouth 4 (four) times daily.   naproxen 500 MG tablet Commonly known as:  NAPROSYN Take 1 tablet (500 mg total) by mouth 3 (three) times daily with meals as needed.   oxyCODONE 5 MG immediate release tablet Commonly known as:  ROXICODONE Take 1 tablet (5 mg total) by mouth every 6 (six) hours as needed for breakthrough pain.   Vitamin D (Ergocalciferol) 1.25 MG (50000 UT) Caps capsule Commonly known as:  DRISDOL Take 1 capsule (50,000 Units total) by mouth every 7 (seven) days.       The results of significant diagnostics from this hospitalization (including imaging, microbiology, ancillary and laboratory) are listed below for reference.    Significant Diagnostic Studies: Dg Chest Portable 1 View  Result Date: 12/27/2018 CLINICAL DATA:  Cough EXAM: PORTABLE CHEST 1 VIEW COMPARISON:  02/08/2018 FINDINGS: Mild cardiomegaly. Normal mediastinal contours. There is no edema, consolidation, effusion, or pneumothorax. IMPRESSION:  No evidence of acute disease. Electronically Signed   By: Marnee Spring M.D.   On: 12/27/2018 06:47    Microbiology: No results found for this or any previous visit (from the past 240 hour(s)).   Labs: Basic Metabolic Panel: Recent Labs  Lab 12/27/18 0206 12/28/18 0634 12/30/18 0524  NA 138 138 138  K 3.4* 2.8* 2.8*  CL 104 103 100  CO2 27 29 31   GLUCOSE 103* 100* 129*  BUN 9 5* 5*  CREATININE 0.48 0.40* 0.44  CALCIUM 9.0 8.3* 8.4*   Liver Function Tests: Recent Labs  Lab 12/27/18 0206 12/30/18 0524  AST 28 24  ALT 13 14  ALKPHOS 41 38  BILITOT 5.4* 2.8*  PROT 8.1 6.3*  ALBUMIN 4.6 3.4*   No results for input(s): LIPASE, AMYLASE in the last 168 hours. No results for input(s): AMMONIA in the last 168 hours. CBC: Recent Labs  Lab 12/27/18 0206  12/28/18 0507 12/30/18 0524 12/30/18 1921 12/31/18 0838  WBC 18.9* 23.4* 15.8*  --   --   NEUTROABS 8.3*  --  5.8  --   --   HGB 7.8* 6.4* 5.9* 5.8* 7.5*  HCT 22.1* 18.6* 17.4* 17.0* 22.1*  MCV 97.4 99.5 101.8*  --   --   PLT 318 320 288  --   --    Cardiac Enzymes: No results for input(s): CKTOTAL, CKMB, CKMBINDEX, TROPONINI in the last 168 hours. BNP: Invalid input(s): POCBNP CBG: No results for input(s): GLUCAP in the last 168 hours.  Time coordinating discharge: 50 minutes  Signed:  Nolon Nations  APRN, MSN, FNP-C Patient Care [facility] [address] [place], [place] [address] [phone]  [facility] 12/31/2018, 4:00 PM

## 2018-12-31 NOTE — Discharge Instructions (Signed)
Sickle Cell Anemia, Adult °Sickle cell anemia is a condition where your red blood cells are shaped like sickles. Red blood cells carry oxygen through the body. Sickle-shaped cells do not live as long as normal red blood cells. They also clump together and block blood from flowing through the blood vessels. This prevents the body from getting enough oxygen. Sickle cell anemia causes organ damage and pain. It also increases the risk of infection. °Follow these instructions at home: °Medicines °· Take over-the-counter and prescription medicines only as told by your doctor. °· If you were prescribed an antibiotic medicine, take it as told by your doctor. Do not stop taking the antibiotic even if you start to feel better. °· If you develop a fever, do not take medicines to lower the fever right away. Tell your doctor about the fever. °Managing pain, stiffness, and swelling °· Try these methods to help with pain: °? Use a heating pad. °? Take a warm bath. °? Distract yourself, such as by watching TV. °Eating and drinking °· Drink enough fluid to keep your pee (urine) clear or pale yellow. Drink more in hot weather and during exercise. °· Limit or avoid alcohol. °· Eat a healthy diet. Eat plenty of fruits, vegetables, whole grains, and lean protein. °· Take vitamins and supplements as told by your doctor. °Traveling °· When traveling, keep these with you: °? Your medical information. °? The names of your doctors. °? Your medicines. °· If you need to take an airplane, talk to your doctor first. °Activity °· Rest often. °· Avoid exercises that make your heart beat much faster, such as jogging. °General instructions °· Do not use products that have nicotine or tobacco, such as cigarettes and e-cigarettes. If you need help quitting, ask your doctor. °· Consider wearing a medical alert bracelet. °· Avoid being in high places (high altitudes), such as mountains. °· Avoid very hot or cold temperatures. °· Avoid places where the  temperature changes a lot. °· Keep all follow-up visits as told by your doctor. This is important. °Contact a doctor if: °· A joint hurts. °· Your feet or hands hurt or swell. °· You feel tired (fatigued). °Get help right away if: °· You have symptoms of infection. These include: °? Fever. °? Chills. °? Being very tired. °? Irritability. °? Poor eating. °? Throwing up (vomiting). °· You feel dizzy or faint. °· You have new stomach pain, especially on the left side. °· You have a an erection (priapism) that lasts more than 4 hours. °· You have numbness in your arms or legs. °· You have a hard time moving your arms or legs. °· You have trouble talking. °· You have pain that does not go away when you take medicine. °· You are short of breath. °· You are breathing fast. °· You have a long-term cough. °· You have pain in your chest. °· You have a bad headache. °· You have a stiff neck. °· Your stomach looks bloated even though you did not eat much. °· Your skin is pale. °· You suddenly cannot see well. °Summary °· Sickle cell anemia is a condition where your red blood cells are shaped like sickles. °· Follow your doctor's advice on ways to manage pain, food to eat, activities to do, and steps to take for safe travel. °· Get medical help right away if you have any signs of infection, such as a fever. °This information is not intended to replace advice given to you by your   health care provider. Make sure you discuss any questions you have with your health care provider. °Document Released: 08/27/2013 Document Revised: 12/12/2016 Document Reviewed: 12/12/2016 °Elsevier Interactive Patient Education © 2019 [employer] Inc. ° °

## 2019-01-03 LAB — TYPE AND SCREEN
ABO/RH(D): A POS
ANTIBODY SCREEN: NEGATIVE
DONOR AG TYPE: NEGATIVE
Donor AG Type: NEGATIVE
Unit division: 0
Unit division: 0

## 2019-01-03 LAB — BPAM RBC
Blood Product Expiration Date: 202002232359
Blood Product Expiration Date: 202002282359
ISSUE DATE / TIME: 202002102348
Unit Type and Rh: 6200
Unit Type and Rh: 6200

## 2019-02-11 ENCOUNTER — Ambulatory Visit: Payer: Self-pay | Admitting: Nurse Practitioner

## 2019-02-13 ENCOUNTER — Ambulatory Visit: Payer: Self-pay | Admitting: Nurse Practitioner

## 2019-02-20 ENCOUNTER — Emergency Department
Admission: EM | Admit: 2019-02-20 | Discharge: 2019-02-20 | Disposition: A | Discharge status: Home / Self Care | Payer: Medicaid Other | Attending: Emergency Medicine | Admitting: Emergency Medicine

## 2019-02-20 ENCOUNTER — Other Ambulatory Visit: Payer: Self-pay

## 2019-02-20 ENCOUNTER — Encounter: Payer: Self-pay | Admitting: *Deleted

## 2019-02-20 DIAGNOSIS — R0981 Nasal congestion: Secondary | ICD-10-CM | POA: Diagnosis present

## 2019-02-20 DIAGNOSIS — R070 Pain in throat: Secondary | ICD-10-CM | POA: Insufficient documentation

## 2019-02-20 DIAGNOSIS — F17200 Nicotine dependence, unspecified, uncomplicated: Secondary | ICD-10-CM | POA: Diagnosis not present

## 2019-02-20 DIAGNOSIS — H5789 Other specified disorders of eye and adnexa: Secondary | ICD-10-CM | POA: Diagnosis not present

## 2019-02-20 DIAGNOSIS — J029 Acute pharyngitis, unspecified: Secondary | ICD-10-CM

## 2019-02-20 DIAGNOSIS — J309 Allergic rhinitis, unspecified: Secondary | ICD-10-CM | POA: Diagnosis not present

## 2019-02-20 MED ORDER — BENZONATATE 100 MG PO CAPS: ORAL_CAPSULE | ORAL | 0 refills | Status: DC

## 2019-02-20 MED ORDER — FLUTICASONE PROPIONATE 50 MCG/ACT NA SUSP: 2.0000 | Freq: Every day | NASAL | 0 refills | Status: DC

## 2019-02-20 MED ORDER — HYDROXYZINE PAMOATE 25 MG PO CAPS: 25.0000 mg | ORAL_CAPSULE | Freq: Three times a day (TID) | ORAL | 0 refills | Status: DC | PRN

## 2019-02-20 NOTE — ED Provider Notes (Signed)
[facility] Emergency Department Provider Note  ____________________________________________   First MD Initiated Contact with Patient 02/20/19 1317     (approximate)  I have reviewed the triage vital signs and the nursing notes.   HISTORY  Chief Complaint Nasal Congestion and Sore Throat  HPI Kara [name] is a 33 y.o. female presents herself to the ED, for evaluation of a one-week complaint of nasal congestion, sore throat, and watery eyes.  Patient has been in [place] for the last week following a family funeral.  She returned home this week, with hopes of being cleared to return to work.  She works as a Associate Professor at [employer].  Patient denies any fevers, chills, sweats patient denies any high risk exposure or contacts.  She does report her family member with whom she stayed, was diagnosed with a sinus infection.  Patient medical history is consistent with sickle cell disease, pulmonary hypertension, and anemia of chronic disease.    Past Medical History:  Diagnosis Date  . Arthritis   . Pulmonary hypertension (HCC)   . Sickle cell anemia [facility])     Patient Active Problem List   Diagnosis Date Noted  . Chronic pain syndrome 12/27/2018  . Anemia of chronic disease 12/27/2018  . Pulmonary hypertension (HCC) 04/04/2018  . Sickle cell disease (HCC) 04/04/2018  . Arthritis 04/04/2018  . Chest pain on breathing   . Leukocytosis 02/06/2018  . Sickle cell crisis (HCC) 02/05/2018  . Sickle cell pain crisis (HCC) 10/29/2017    Past Surgical History:  Procedure Laterality Date  . DILATION AND CURETTAGE OF UTERUS    . GALLBLADDER SURGERY      Prior to Admission medications   Medication Sig Start Date End Date Taking? Authorizing Provider  acetaminophen (TYLENOL) 500 MG tablet Take 1,000 mg by mouth every 6 (six) hours as needed for mild pain.    [provider]  benzonatate (TESSALON PERLES) 100 MG capsule Take 1-2 tabs TID prn  cough 02/20/19   Rethel Sebek, Charlesetta Ivory, PA-C  fluticasone (FLONASE) 50 MCG/ACT nasal spray Place 2 sprays into both nostrils daily. 02/20/19   Gavin Faivre, Charlesetta Ivory, PA-C  folic acid (FOLVITE) 1 MG tablet Take 1 tablet (1 mg total) by mouth daily. 12/31/18   Massie Maroon, FNP  hydrOXYzine (VISTARIL) 25 MG capsule Take 1 capsule (25 mg total) by mouth 3 (three) times daily as needed for itching. 02/20/19   Shadrach Bartunek, Charlesetta Ivory, PA-C  loratadine (CLARITIN) 10 MG tablet Take 1 tablet (10 mg total) by mouth daily. Patient not taking: Reported on 12/27/2018 04/04/18   Andreas Ohm, NP  methocarbamol (ROBAXIN) 500 MG tablet Take 1 tablet (500 mg total) by mouth 4 (four) times daily. 12/31/18   Massie Maroon, FNP  naproxen (NAPROSYN) 500 MG tablet Take 1 tablet (500 mg total) by mouth 3 (three) times daily with meals as needed. 12/31/18   Massie Maroon, FNP  oxyCODONE (ROXICODONE) 5 MG immediate release tablet Take 1 tablet (5 mg total) by mouth every 6 (six) hours as needed for breakthrough pain. 12/31/18   Massie Maroon, FNP  Vitamin D, Ergocalciferol, (DRISDOL) 50000 units CAPS capsule Take 1 capsule (50,000 Units total) by mouth every 7 (seven) days. Patient not taking: Reported on 12/27/2018 04/04/18   Andreas Ohm, NP    Allergies Kiwi extract; Morphine and related; and Paroxetine  Family History  Adopted: Yes    Social History Social History  Tobacco Use  . Smoking status: Current Every Day Smoker  . Smokeless tobacco: Never Used  Substance Use Topics  . Alcohol use: No    Frequency: Never  . Drug use: Not on file    Review of Systems Constitutional: Denies any fevers, chills, sweats. ENT: Reports sinus congestion, sore throat. Cardiovascular: No chest pain. Respiratory: Denies any cough, wheezing, or shortness of breath. Musculoskeletal: Negative for neck pain nor stiffness. Integumentary: Negative for rash. Neurological: No focal weakness nor  numbness. ____________________________________________   PHYSICAL EXAM:  VITAL SIGNS: ED Triage Vitals  Enc Vitals Group     BP 02/20/19 1314 114/73     Pulse Rate 02/20/19 1308 92     Resp 02/20/19 1308 20     Temp 02/20/19 1308 98.9 F (37.2 C)     Temp Source 02/20/19 1308 Oral     SpO2 02/20/19 1308 99 %     Weight 02/20/19 1307 120 lb (54.4 kg)     Height 02/20/19 1307 5\' 4"  (1.626 m)     Head Circumference --      Peak Flow --      Pain Score 02/20/19 1307 0     Pain Loc --      Pain Edu? --      Excl. in GC? --     Constitutional: Alert and oriented. Generally well appearing and in no acute distress. Eyes: Conjunctivae are mildly ictheric.  Nose: no turbinate edema Mouth: uvula is midline and tonsils are flat Neck: No stridor.  No meningeal signs.   Cardiovascular: Grossly normal heart sounds. Respiratory: normal respiratory effort. No wheeze, rales, or rhonchi Musculoskeletal: No lower extremity tenderness nor edema. No gross deformities of extremities. Neurologic:  Normal speech and language. No gross focal neurologic deficits are appreciated.  Skin:  Skin is warm, dry and intact. No rash noted. ____________________________________________   LABS (all labs ordered are listed, but only abnormal results are displayed)  Labs Reviewed - No data to display ____________________________________________  RADIOLOGY  Official radiology report(s): No results found. ____________________________________________  INITIAL IMPRESSION / MDM / ASSESSMENT AND PLAN / ED COURSE  As part of my medical decision making, I reviewed the following data within the electronic medical record.    Patient with ED evaluation of runny nose, sore throat, and watery eyes. Patient symptoms are most consistent with allergy symptoms. She is discharged with prescriptions for Flonase, hydroxyzine and Tessalon Perles. She will follow-up with her provider or return as needed. A work note is  provided, returning her to work on Sunday, as requested.   Patient's vital signs are stable.  This patient currently does not meet criteria for testing and I explained that in detail to the patient.  The evaluation today is reassuring with no evidence of emergent medical condition that requires further work-up or evaluation or inpatient treatment.  I provided follow-up recommendations and strict return precautions.  The patient understands and agrees with the plan. ____________________________________________  FINAL CLINICAL IMPRESSION(S) / ED DIAGNOSES  Final diagnoses:  Allergic rhinitis, unspecified seasonality, unspecified trigger  Sore throat     Note:  This document was prepared using Dragon voice recognition software and may include unintentional dictation errors.    Lissa Hoard, PA-C 02/20/19 2017    Nita Sickle, MD 02/21/19 [phone]

## 2019-02-20 NOTE — ED Triage Notes (Signed)
Pt ambulatory to the tent.  Pt was in [place] for a funeral, returned this week and continues to have nasal congestion, sore throat and watery eyes.  Pt alert speech clear.

## 2019-02-20 NOTE — ED Notes (Signed)
Pt reports going to a funeral in [place] February 07, 2019.  Pt has sickle cell.  Pt reports getting sick in [place] and returned home to [place] 4 days ago.  Pt continues to have a cough, nasal congestion and watery eyes.  No fever.  cig smoker.  Pt alert and speech clear.

## 2019-02-21 ENCOUNTER — Ambulatory Visit: Payer: Medicaid Other | Admitting: Nurse Practitioner

## 2019-02-21 ENCOUNTER — Telehealth: Payer: Self-pay

## 2019-02-21 NOTE — Telephone Encounter (Signed)
I called pt to screen for COVID-19, because of her face-to-face appt scheduled for today. The pt state she was just seen in the ER yesterday for respiratory issues. I cancelled the appt today, because of the risk factors. She was first given the option to do a virtual visit. The pt doesn't have a smartphone, Internet or computer access. I instructed the patient to call back and rescheduled her New patient appt when she is well and advise her to maybe wait to after the pandemic. She verbalize understanding, no questions or concerns.

## 2019-04-21 ENCOUNTER — Emergency Department: Payer: Medicaid Other

## 2019-04-21 ENCOUNTER — Emergency Department
Admission: EM | Admit: 2019-04-21 | Discharge: 2019-04-22 | Disposition: A | Discharge status: Home / Self Care | Payer: Medicaid Other | Attending: Emergency Medicine | Admitting: Emergency Medicine

## 2019-04-21 ENCOUNTER — Encounter: Payer: Self-pay | Admitting: Emergency Medicine

## 2019-04-21 ENCOUNTER — Other Ambulatory Visit: Payer: Self-pay

## 2019-04-21 DIAGNOSIS — D571 Sickle-cell disease without crisis: Secondary | ICD-10-CM | POA: Diagnosis not present

## 2019-04-21 DIAGNOSIS — F17210 Nicotine dependence, cigarettes, uncomplicated: Secondary | ICD-10-CM | POA: Diagnosis not present

## 2019-04-21 DIAGNOSIS — N12 Tubulo-interstitial nephritis, not specified as acute or chronic: Secondary | ICD-10-CM | POA: Insufficient documentation

## 2019-04-21 DIAGNOSIS — R300 Dysuria: Secondary | ICD-10-CM | POA: Diagnosis present

## 2019-04-21 DIAGNOSIS — Z79899 Other long term (current) drug therapy: Secondary | ICD-10-CM | POA: Insufficient documentation

## 2019-04-21 LAB — CBC
HCT: 23.5 % — ABNORMAL LOW (ref 36.0–46.0)
Hemoglobin: 8.4 g/dL — ABNORMAL LOW (ref 12.0–15.0)
MCH: 35.4 pg — ABNORMAL HIGH (ref 26.0–34.0)
MCHC: 35.7 g/dL (ref 30.0–36.0)
MCV: 99.2 fL (ref 80.0–100.0)
Platelets: 355 10*3/uL (ref 150–400)
RBC: 2.37 MIL/uL — ABNORMAL LOW (ref 3.87–5.11)
RDW: 20.2 % — ABNORMAL HIGH (ref 11.5–15.5)
WBC: 20.5 10*3/uL — ABNORMAL HIGH (ref 4.0–10.5)
nRBC: 1.1 % — ABNORMAL HIGH (ref 0.0–0.2)

## 2019-04-21 LAB — BASIC METABOLIC PANEL
Anion gap: 9 (ref 5–15)
BUN: 6 mg/dL (ref 6–20)
CO2: 23 mmol/L (ref 22–32)
Calcium: 8.7 mg/dL — ABNORMAL LOW (ref 8.9–10.3)
Chloride: 105 mmol/L (ref 98–111)
Creatinine, Ser: 0.51 mg/dL (ref 0.44–1.00)
GFR calc Af Amer: >60 mL/min (ref >60)
GFR calc non Af Amer: >60 mL/min (ref >60)
Glucose, Bld: 100 mg/dL — ABNORMAL HIGH (ref 70–99)
Potassium: 3.3 mmol/L — ABNORMAL LOW (ref 3.5–5.1)
Sodium: 137 mmol/L (ref 135–145)

## 2019-04-21 LAB — URINALYSIS, COMPLETE (UACMP) WITH MICROSCOPIC
Bacteria, UA: NONE SEEN
Bilirubin Urine: NEGATIVE
Glucose, UA: NEGATIVE mg/dL
Ketones, ur: NEGATIVE mg/dL
Nitrite: NEGATIVE
Protein, ur: 100 mg/dL — AB
RBC / HPF: >50 RBC/hpf — ABNORMAL HIGH (ref 0–5)
Specific Gravity, Urine: 1.009 (ref 1.005–1.030)
Squamous Epithelial / HPF: NONE SEEN (ref 0–5)
WBC, UA: >50 WBC/hpf — ABNORMAL HIGH (ref 0–5)
pH: 8 (ref 5.0–8.0)

## 2019-04-21 LAB — POCT PREGNANCY, URINE: Preg Test, Ur: NEGATIVE

## 2019-04-21 MED ORDER — OXYCODONE-ACETAMINOPHEN 5-325 MG PO TABS
2.0000 | ORAL_TABLET | Freq: Once | ORAL | Status: AC
  Administered 2019-04-21: 2 via ORAL
  Filled 2019-04-21: qty 2

## 2019-04-21 MED ORDER — HYDROMORPHONE HCL 1 MG/ML IJ SOLN
0.5000 mg | Freq: Once | INTRAMUSCULAR | Status: AC
  Administered 2019-04-21: 0.5 mg via INTRAVENOUS
  Filled 2019-04-21: qty 1

## 2019-04-21 MED ORDER — CEPHALEXIN 500 MG PO CAPS: 500.0000 mg | ORAL_CAPSULE | Freq: Four times a day (QID) | ORAL | 0 refills | Status: DC

## 2019-04-21 MED ORDER — SODIUM CHLORIDE 0.9 % IV SOLN
1.0000 g | Freq: Once | INTRAVENOUS | Status: AC
  Administered 2019-04-21: 1 g via INTRAVENOUS
  Filled 2019-04-21: qty 10

## 2019-04-21 MED ORDER — ONDANSETRON HCL 4 MG/2ML IJ SOLN
4.0000 mg | Freq: Once | INTRAMUSCULAR | Status: AC
  Administered 2019-04-21: 4 mg via INTRAVENOUS
  Filled 2019-04-21: qty 2

## 2019-04-21 MED ORDER — SODIUM CHLORIDE 0.9 % IV BOLUS
1000.0000 mL | Freq: Once | INTRAVENOUS | Status: AC
  Administered 2019-04-21: 1000 mL via INTRAVENOUS

## 2019-04-21 MED ORDER — HYDROMORPHONE HCL 2 MG PO TABS: 2.0000 mg | ORAL_TABLET | Freq: Two times a day (BID) | ORAL | 0 refills | Status: AC | PRN

## 2019-04-21 NOTE — ED Notes (Signed)
Urine, green, and purple tubes sent to lab. 

## 2019-04-21 NOTE — Discharge Instructions (Signed)
Please return for higher fever, vomiting and unable to keep down the medicine or worse pain or feeling sicker.  Also return if you are not better in 2 days.  Please follow-up with your doctor in the next couple days.  Get the Keflex prescription filled in the morning and start taking it 1 pill 4 times a day.  This antibiotic should help treat the infection.  I have given you some pain medicine to I gave you a prescription for Dilaudid 1 pill twice a day.  I only gave you 3 of these.  The pain should be a lot better by the time you take the third 1.  You should not need more than that.

## 2019-04-21 NOTE — ED Notes (Signed)
ED Provider at bedside. 

## 2019-04-21 NOTE — ED Provider Notes (Signed)
[facility] Emergency Department Provider Note   ____________________________________________   First MD Initiated Contact with Patient 04/21/19 2043     (approximate)  I have reviewed the triage vital signs and the nursing notes.   HISTORY  Chief Complaint Dysuria and Flank Pain   HPI Kara [name] is a 33 y.o. female patient reports dysuria with abdominal pain and rapidly transitioned to flank pain is much worse on the right than anywhere else.  She has had a fever at home but no nausea or vomiting.  She thinks she is having a kidney infection.  Additionally she has sickle cell anemia.         Past Medical History:  Diagnosis Date  . Arthritis   . Pulmonary hypertension (HCC)   . Sickle cell anemia [facility])     Patient Active Problem List   Diagnosis Date Noted  . Chronic pain syndrome 12/27/2018  . Anemia of chronic disease 12/27/2018  . Pulmonary hypertension (HCC) 04/04/2018  . Sickle cell disease (HCC) 04/04/2018  . Arthritis 04/04/2018  . Chest pain on breathing   . Leukocytosis 02/06/2018  . Sickle cell crisis (HCC) 02/05/2018  . Sickle cell pain crisis (HCC) 10/29/2017    Past Surgical History:  Procedure Laterality Date  . DILATION AND CURETTAGE OF UTERUS    . GALLBLADDER SURGERY      Prior to Admission medications   Medication Sig Start Date End Date Taking? Authorizing Provider  acetaminophen (TYLENOL) 500 MG tablet Take 1,000 mg by mouth every 6 (six) hours as needed for mild pain.    [provider]  benzonatate (TESSALON PERLES) 100 MG capsule Take 1-2 tabs TID prn cough 02/20/19   Menshew, Charlesetta Ivory, PA-C  cephALEXin (KEFLEX) 500 MG capsule Take 1 capsule (500 mg total) by mouth 4 (four) times daily for 10 days. 04/21/19 05/01/19  Arnaldo Natal, MD  fluticasone (FLONASE) 50 MCG/ACT nasal spray Place 2 sprays into both nostrils daily. 02/20/19   Menshew, Charlesetta Ivory, PA-C  folic acid (FOLVITE) 1 MG tablet Take 1  tablet (1 mg total) by mouth daily. 12/31/18   Massie Maroon, FNP  HYDROmorphone (DILAUDID) 2 MG tablet Take 1 tablet (2 mg total) by mouth every 12 (twelve) hours as needed for up to 5 days for severe pain. 04/21/19 04/26/19  Arnaldo Natal, MD  hydrOXYzine (VISTARIL) 25 MG capsule Take 1 capsule (25 mg total) by mouth 3 (three) times daily as needed for itching. 02/20/19   Menshew, Charlesetta Ivory, PA-C  loratadine (CLARITIN) 10 MG tablet Take 1 tablet (10 mg total) by mouth daily. Patient not taking: Reported on 12/27/2018 04/04/18   Andreas Ohm, NP  methocarbamol (ROBAXIN) 500 MG tablet Take 1 tablet (500 mg total) by mouth 4 (four) times daily. 12/31/18   Massie Maroon, FNP  naproxen (NAPROSYN) 500 MG tablet Take 1 tablet (500 mg total) by mouth 3 (three) times daily with meals as needed. 12/31/18   Massie Maroon, FNP  oxyCODONE (ROXICODONE) 5 MG immediate release tablet Take 1 tablet (5 mg total) by mouth every 6 (six) hours as needed for breakthrough pain. 12/31/18   Massie Maroon, FNP  Vitamin D, Ergocalciferol, (DRISDOL) 50000 units CAPS capsule Take 1 capsule (50,000 Units total) by mouth every 7 (seven) days. Patient not taking: Reported on 12/27/2018 04/04/18   Andreas Ohm, NP    Allergies Kiwi extract; Morphine and related; and Paroxetine  Family History  Adopted: Yes    Social History Social History   Tobacco Use  . Smoking status: Current Every Day Smoker  . Smokeless tobacco: Never Used  Substance Use Topics  . Alcohol use: No    Frequency: Never  . Drug use: Not on file    Review of Systems  Constitutional:  fever/chills Eyes: No visual changes. ENT: No sore throat. Cardiovascular: Denies chest pain. Respiratory: Denies shortness of breath. Gastrointestinal: No abdominal pain.  No nausea, no vomiting.  No diarrhea.  No constipation. Genitourinary:  dysuria. Musculoskeletal: Negative for back pain. Skin: Negative for rash.  Neurological: Negative for headaches, focal weakness    ____________________________________________   PHYSICAL EXAM:  VITAL SIGNS: ED Triage Vitals  Enc Vitals Group     BP 04/21/19 1833 (!) 104/56     Pulse Rate 04/21/19 1833 99     Resp 04/21/19 1833 20     Temp 04/21/19 1833 99.5 F (37.5 C)     Temp Source 04/21/19 1833 Oral     SpO2 04/21/19 1833 96 %     Weight 04/21/19 1835 120 lb (54.4 kg)     Height 04/21/19 1835  (1.626 m)     Head Circumference --      Peak Flow --      Pain Score 04/21/19 1834 10     Pain Loc --      Pain Edu? --      Excl. in [place]? --     Constitutional: Alert and oriented. Well appearing and in no acute distress. Eyes: Conjunctivae are normal.  Head: Atraumatic. Nose: No congestion/rhinnorhea. Mouth/Throat: Mucous membranes are moist.  Oropharynx non-erythematous. Neck: No stridor.   Cardiovascular: Normal rate, regular rhythm. Grossly normal heart sounds.  Good peripheral circulation. Respiratory: Normal respiratory effort.  No retractions. Lungs CTAB. Gastrointestinal: Soft and nontender. No distention. No abdominal bruits.  Right CVA tenderness. Musculoskeletal: No lower extremity tenderness nor edema.   Neurologic:  Normal speech and language. No gross focal neurologic deficits are appreciated. Skin:  Skin is warm, dry and intact. No rash noted.   ____________________________________________   LABS (all labs ordered are listed, but only abnormal results are displayed)  Labs Reviewed  URINALYSIS, COMPLETE (UACMP) WITH MICROSCOPIC - Abnormal; Notable for the following components:      Result Value   Color, Urine AMBER (*)    APPearance CLOUDY (*)    Hgb urine dipstick MODERATE (*)    Protein, ur 100 (*)    Leukocytes,Ua LARGE (*)    RBC / HPF >50 (*)    WBC, UA >50 (*)    All other components within normal limits  BASIC METABOLIC PANEL - Abnormal; Notable for the following components:   Potassium 3.3 (*)    Glucose, Bld  100 (*)    Calcium 8.7 (*)    All other components within normal limits  CBC - Abnormal; Notable for the following components:   WBC 20.5 (*)    RBC 2.37 (*)    Hemoglobin 8.4 (*)    HCT 23.5 (*)    MCH 35.4 (*)    RDW 20.2 (*)    nRBC 1.1 (*)    All other components within normal limits  URINE CULTURE  POC URINE PREG, ED  POCT PREGNANCY, URINE   ____________________________________________  EKG   ____________________________________________  RADIOLOGY  ED MD interpretation:    Official radiology report(s): No results found.  ____________________________________________   PROCEDURES  Procedure(s) performed (including Critical Care):  Procedures   ____________________________________________   INITIAL IMPRESSION / ASSESSMENT AND PLAN / ED COURSE  Kara [name] was evaluated in Emergency Department on 04/21/2019 for the symptoms described in the history of present illness. She was evaluated in the context of the global COVID-19 pandemic, which necessitated consideration that the patient might be at risk for infection with the SARS-CoV-2 virus that causes COVID-19. Institutional protocols and algorithms that pertain to the evaluation of patients at risk for COVID-19 are in a state of rapid change based on information released by regulatory bodies including the CDC and federal and state organizations. These policies and algorithms were followed during the patient's care in the ED. patient has not had her ultrasound yet.  I will make sure there is no obstruction.  She is already had gallstones.  She could potentially have a renal stone as well.  Anticipate discharge on Keflex with a little bit of pain medication if the ultrasound does not show any hydronephrosis.  Dr.Vernese follow-up the ultrasound.              ____________________________________________   FINAL CLINICAL IMPRESSION(S) / ED DIAGNOSES  Final diagnoses:  Pyelonephritis     ED Discharge Orders          Ordered    cephALEXin (KEFLEX) 500 MG capsule  4 times daily     04/21/19 2324    HYDROmorphone (DILAUDID) 2 MG tablet  Every 12 hours PRN     04/21/19 2326           Note:  This document was prepared using Dragon voice recognition software and may include unintentional dictation errors.    Arnaldo NatalMalinda,  F, MD 04/21/19 2328

## 2019-04-21 NOTE — ED Triage Notes (Signed)
Bilateral flank pain and burning with urination began today.

## 2019-04-22 NOTE — ED Provider Notes (Addendum)
-----------------------------------------   12:55 AM on 04/22/2019 -----------------------------------------   Blood pressure 111/85, pulse 92, temperature 98.7 F (37.1 C), temperature source Oral, resp. rate 16, height 5\' 4"  (1.626 m), weight 54.4 kg, last menstrual period 04/07/2019, SpO2 99 %.  Assuming care from Dr. Darnelle Catalan of Adreena Wery is a 33 y.o. female with a chief complaint of Dysuria and Flank Pain .    Please refer to H&P by previous MD for further details.  The current plan of care is to f/u renal US to rule out obstruction in the setting of pyelonephritis.   I have personally reviewed the images performed during this visit and I agree with the Radiologist's read.   Interpretation by Radiologist:  US Renal  Result Date: 04/22/2019 CLINICAL DATA:  Obstruction.  Bilateral flank pain EXAM: RENAL / URINARY TRACT ULTRASOUND COMPLETE COMPARISON:  None. FINDINGS: Right Kidney: Renal measurements: 12.8 x 4.5 x 5.5 cm = volume: 168 mL . Echogenicity within normal limits. No mass or hydronephrosis visualized. Left Kidney: Renal measurements: 12.1 x 6.1 x 6.4 cm = volume: 245 mL. Echogenicity within normal limits. No mass or hydronephrosis visualized. Bladder: Both ureteral jets were visualized. IMPRESSION: No acute sonographic abnormality. No hydronephrosis. Both ureteral jets were visualized. Electronically Signed   By: Katherine Mantle M.D.   On: 04/22/2019 00:49     [place] negative for hydronephrosis, both ureteral jets visualized.  Patient remains well-appearing and afebrile with no tachycardia no signs of sepsis at this time.  She received Rocephin.  She will be discharged home on Keflex, discussed return precautions for fever, worsening pain, vomiting. Patient had a brief episode of hypoxia with sats in the upper 80s after receiving IV dilaudid and falling asleep. Patient is awake and alert now, normal sats at rest and with ambulation, no hypoxia, no cough, no SOB, or CP. Otherwise  she will follow-up with her primary care doctor       Don Perking, [place], MD 04/22/19 3903    Nita Sickle, MD 04/22/19 [phone]

## 2019-04-24 LAB — URINE CULTURE: Culture: >=100000 — AB

## 2019-05-12 ENCOUNTER — Emergency Department
Admission: EM | Admit: 2019-05-12 | Discharge: 2019-05-12 | Disposition: A | Discharge status: Home / Self Care | Payer: Medicaid Other | Attending: Emergency Medicine | Admitting: Emergency Medicine

## 2019-05-12 ENCOUNTER — Other Ambulatory Visit: Payer: Self-pay

## 2019-05-12 DIAGNOSIS — H5789 Other specified disorders of eye and adnexa: Secondary | ICD-10-CM | POA: Diagnosis not present

## 2019-05-12 DIAGNOSIS — F17210 Nicotine dependence, cigarettes, uncomplicated: Secondary | ICD-10-CM | POA: Diagnosis not present

## 2019-05-12 DIAGNOSIS — H5711 Ocular pain, right eye: Secondary | ICD-10-CM

## 2019-05-12 DIAGNOSIS — Z79899 Other long term (current) drug therapy: Secondary | ICD-10-CM | POA: Insufficient documentation

## 2019-05-12 MED ORDER — NEOMYCIN-POLYMYXIN-DEXAMETH 3.5-10000-0.1 OP OINT
TOPICAL_OINTMENT | Freq: Once | OPHTHALMIC | Status: AC
  Administered 2019-05-12: 1 via OPHTHALMIC
  Filled 2019-05-12: qty 3.5

## 2019-05-12 MED ORDER — NEOMYCIN-POLYMYXIN-DEXAMETH 3.5-10000-0.1 OP SUSP: 2.0000 [drp] | Freq: Four times a day (QID) | OPHTHALMIC | 0 refills | Status: DC

## 2019-05-12 MED ORDER — TETRACAINE HCL 0.5 % OP SOLN
2.0000 [drp] | Freq: Once | OPHTHALMIC | Status: AC
  Administered 2019-05-12: 2 [drp] via OPHTHALMIC
  Filled 2019-05-12: qty 4

## 2019-05-12 MED ORDER — FLUORESCEIN SODIUM 1 MG OP STRP
1.0000 | ORAL_STRIP | Freq: Once | OPHTHALMIC | Status: AC
  Administered 2019-05-12: 1 via OPHTHALMIC
  Filled 2019-05-12: qty 1

## 2019-05-12 NOTE — ED Notes (Signed)
See triage note  Presents with right eye pain about 1 week ago  States she did have drainage to eye  But it went away until this am

## 2019-05-12 NOTE — ED Provider Notes (Signed)
[facility] Emergency Department Provider Note  ____________________________________________  Time seen: Approximately 6:38 PM  I have reviewed the triage vital signs and the nursing notes.   HISTORY  Chief Complaint Eye Drainage    HPI Kara [name] is a 33 y.o. female with past medical history of sickle cell anemia that presents  to the emergency department for evaluation of right eye pain, photophobia, watery clear drainage for 1 week.  She has some mild blurry vision to this eye.  She has had to wear an eye patch for light sensitivity.  Patient symptoms improved 2 days ago but returned today.  Patient does not wear contacts.  No trauma.  No fevers.  Past Medical History:  Diagnosis Date  . Arthritis   . Pulmonary hypertension (HCC)   . Sickle cell anemia [facility](HCC)     Patient Active Problem List   Diagnosis Date Noted  . Chronic pain syndrome 12/27/2018  . Anemia of chronic disease 12/27/2018  . Pulmonary hypertension (HCC) 04/04/2018  . Sickle cell disease (HCC) 04/04/2018  . Arthritis 04/04/2018  . Chest pain on breathing   . Leukocytosis 02/06/2018  . Sickle cell crisis (HCC) 02/05/2018  . Sickle cell pain crisis (HCC) 10/29/2017    Past Surgical History:  Procedure Laterality Date  . DILATION AND CURETTAGE OF UTERUS    . GALLBLADDER SURGERY      Prior to Admission medications   Medication Sig Start Date End Date Taking? Authorizing Provider  acetaminophen (TYLENOL) 500 MG tablet Take 1,000 mg by mouth every 6 (six) hours as needed for mild pain.    [provider]  fluticasone (FLONASE) 50 MCG/ACT nasal spray Place 2 sprays into both nostrils daily. 02/20/19   Menshew, Charlesetta IvoryJenise V Bacon, PA-C  folic acid (FOLVITE) 1 MG tablet Take 1 tablet (1 mg total) by mouth daily. 12/31/18   Massie MaroonHollis, Lachina M, FNP  hydrOXYzine (VISTARIL) 25 MG capsule Take 1 capsule (25 mg total) by mouth 3 (three) times daily as needed for itching. 02/20/19   Menshew,  Charlesetta IvoryJenise V Bacon, PA-C  neomycin-polymyxin b-dexamethasone (MAXITROL) 3.5-10000-0.1 SUSP Place 2 drops into the right eye every 6 (six) hours. 05/12/19   Enid DerryWagner, Minela [place], PA-C    Allergies Kiwi extract, Morphine and related, and Paroxetine  Family History  Adopted: Yes    Social History Social History   Tobacco Use  . Smoking status: Current Every Day Smoker  . Smokeless tobacco: Never Used  Substance Use Topics  . Alcohol use: No    Frequency: Never  . Drug use: Not on file     Review of Systems  Constitutional: No fever/chills Cardiovascular: No chest pain. Respiratory:  No SOB. Gastrointestinal: No nausea, no vomiting.  Musculoskeletal: Negative for musculoskeletal pain. Skin: Negative for rash, abrasions, lacerations, ecchymosis. Neurological: Positive for headache.   ____________________________________________   PHYSICAL EXAM:  VITAL SIGNS: ED Triage Vitals  Enc Vitals Group     BP 05/12/19 1652 116/67     Pulse Rate 05/12/19 1652 95     Resp 05/12/19 1652 17     Temp 05/12/19 1652 97.9 F (36.6 C)     Temp Source 05/12/19 1652 Oral     SpO2 05/12/19 1652 95 %     Weight 05/12/19 1653 120 lb (54.4 kg)     Height 05/12/19 1653 5\' 4"  (1.626 m)     Head Circumference --      Peak Flow --      Pain Score 05/12/19 1653  8     Pain Loc --      Pain Edu? --      Excl. in [place]? --      Constitutional: Alert and oriented. Well appearing and in no acute distress. Eyes: Right conjunctiva is injected.  PERRL. EOMI. watery drainage.  No defect on fluorescein stain.  No corneal ulcer.  Tonometer pressures 18-22. Visual acuity R 20/70 L 20/40 without glasses.  No orbital swelling or erythema.  No subconjunctival hemorrhage,  hyphema, hypopyon. Head: Atraumatic. ENT:      Ears:      Nose: No congestion/rhinnorhea.      Mouth/Throat: Mucous membranes are moist.  Neck: No stridor.  Cardiovascular: Normal rate, regular rhythm.  Good peripheral  circulation. Respiratory: Normal respiratory effort without tachypnea or retractions. Lungs CTAB. Good air entry to the bases with no decreased or absent breath sounds. Musculoskeletal: Full range of motion to all extremities. No gross deformities appreciated. Neurologic:  Normal speech and language. No gross focal neurologic deficits are appreciated.  Skin:  Skin is warm, dry and intact. No rash noted. Psychiatric: Mood and affect are normal. Speech and behavior are normal. Patient exhibits appropriate insight and judgement.   ____________________________________________   LABS (all labs ordered are listed, but only abnormal results are displayed)  Labs Reviewed - No data to display ____________________________________________  EKG   ____________________________________________  RADIOLOGY  No results found.  ____________________________________________    PROCEDURES  Procedure(s) performed:    Procedures    Medications  tetracaine (PONTOCAINE) 0.5 % ophthalmic solution 2 drop (2 drops Right Eye Given by Other 05/12/19 1825)  fluorescein ophthalmic strip 1 strip (1 strip Right Eye Given 05/12/19 1825)  neomycin-polymyxin b-dexamethasone (MAXITROL) ophthalmic ointment (1 application Right Eye Given 05/12/19 1946)     ____________________________________________   INITIAL IMPRESSION / ASSESSMENT AND PLAN / ED COURSE  Pertinent labs & imaging results that were available during my care of the patient were reviewed by me and considered in my medical decision making (see chart for details).  Review of the [place] CSRS was performed in accordance of the [place] prior to dispensing any controlled drugs.  Known to the emergency department for evaluation of red and painful eye.  No evidence of corneal abrasion on exam.  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  Case was discussed with Dr. Neville Route who recommends that patient be placed on Maxitrol drops and to call [place]  in the morning to make an appointment for tomorrow with him. Patient is agreeable with plan. Patient will be discharged home with prescriptions for Maxitrol. Patient is to follow up with opthamology as directed. Patient is given ED precautions to return to the ED for any worsening or new symptoms.  Kara [name] was evaluated in Emergency Department on 05/12/2019 for the symptoms described in the history of present illness. She was evaluated in the context of the global COVID-19 pandemic, which necessitated consideration that the patient might be at risk for infection with the SARS-CoV-2 virus that causes COVID-19. Institutional protocols and algorithms that pertain to the evaluation of patients at risk for COVID-19 are in a state of rapid change based on information released by regulatory bodies including the CDC and federal and state organizations. These policies and algorithms were followed during the patient's care in the ED.   ____________________________________________  FINAL CLINICAL IMPRESSION(S) / ED DIAGNOSES  Final diagnoses:  Red eye  Pain of right eye      NEW MEDICATIONS STARTED DURING THIS VISIT:  ED Discharge Orders         Ordered    neomycin-polymyxin b-dexamethasone (MAXITROL) 3.5-10000-0.1 SUSP  Every 6 hours     05/12/19 1934              This chart was dictated using voice recognition software/Dragon. Despite best efforts to proofread, errors can occur which can change the meaning. Any change was purely unintentional.    Enid DerryWagner, Yoselyn Mcglade, PA-C 05/12/19 2240    Sharman CheekStafford, Phillip, MD 05/12/19 [phone]

## 2019-05-12 NOTE — ED Triage Notes (Signed)
Pt c/io right eye irritation and drainage for the past week, states it happened last week and thought it was getting better until today.

## 2019-05-12 NOTE — Discharge Instructions (Addendum)
Please call Dr. Sharene Butters office in the morning for an appointment tomorrow.  He will be expecting your call for an appointment.

## 2019-07-01 ENCOUNTER — Encounter: Payer: Self-pay | Admitting: Emergency Medicine

## 2019-07-01 ENCOUNTER — Emergency Department
Admission: EM | Admit: 2019-07-01 | Discharge: 2019-07-01 | Disposition: A | Discharge status: Other Acute Inpatient Hospital | Payer: Medicare Other | Attending: Emergency Medicine | Admitting: Emergency Medicine

## 2019-07-01 ENCOUNTER — Emergency Department: Payer: Medicare Other

## 2019-07-01 ENCOUNTER — Other Ambulatory Visit: Payer: Self-pay

## 2019-07-01 ENCOUNTER — Encounter (HOSPITAL_COMMUNITY): Payer: Self-pay | Admitting: *Deleted

## 2019-07-01 ENCOUNTER — Inpatient Hospital Stay (HOSPITAL_COMMUNITY)
Admission: AD | Admit: 2019-07-01 | Discharge: 2019-07-06 | DRG: 812 | Disposition: A | Discharge status: Home / Self Care | Payer: Medicare Other | Source: Other Acute Inpatient Hospital | Attending: Internal Medicine | Admitting: Internal Medicine

## 2019-07-01 DIAGNOSIS — F17200 Nicotine dependence, unspecified, uncomplicated: Secondary | ICD-10-CM | POA: Diagnosis present

## 2019-07-01 DIAGNOSIS — Z20828 Contact with and (suspected) exposure to other viral communicable diseases: Secondary | ICD-10-CM | POA: Diagnosis present

## 2019-07-01 DIAGNOSIS — M545 Low back pain: Secondary | ICD-10-CM | POA: Diagnosis present

## 2019-07-01 DIAGNOSIS — D72829 Elevated white blood cell count, unspecified: Secondary | ICD-10-CM | POA: Diagnosis present

## 2019-07-01 DIAGNOSIS — G894 Chronic pain syndrome: Secondary | ICD-10-CM | POA: Diagnosis present

## 2019-07-01 DIAGNOSIS — F418 Other specified anxiety disorders: Secondary | ICD-10-CM | POA: Diagnosis present

## 2019-07-01 DIAGNOSIS — G4452 New daily persistent headache (NDPH): Secondary | ICD-10-CM | POA: Diagnosis present

## 2019-07-01 DIAGNOSIS — D571 Sickle-cell disease without crisis: Secondary | ICD-10-CM | POA: Diagnosis present

## 2019-07-01 DIAGNOSIS — Z79899 Other long term (current) drug therapy: Secondary | ICD-10-CM

## 2019-07-01 DIAGNOSIS — I272 Pulmonary hypertension, unspecified: Secondary | ICD-10-CM | POA: Insufficient documentation

## 2019-07-01 DIAGNOSIS — G44209 Tension-type headache, unspecified, not intractable: Secondary | ICD-10-CM | POA: Diagnosis present

## 2019-07-01 DIAGNOSIS — Z7951 Long term (current) use of inhaled steroids: Secondary | ICD-10-CM | POA: Diagnosis not present

## 2019-07-01 DIAGNOSIS — D5700 Hb-SS disease with crisis, unspecified: Secondary | ICD-10-CM | POA: Diagnosis present

## 2019-07-01 DIAGNOSIS — D638 Anemia in other chronic diseases classified elsewhere: Secondary | ICD-10-CM | POA: Diagnosis present

## 2019-07-01 DIAGNOSIS — F17210 Nicotine dependence, cigarettes, uncomplicated: Secondary | ICD-10-CM | POA: Insufficient documentation

## 2019-07-01 LAB — CBC WITH DIFFERENTIAL/PLATELET
Abs Immature Granulocytes: 0.24 10*3/uL — ABNORMAL HIGH (ref 0.00–0.07)
Basophils Absolute: 0.1 10*3/uL (ref 0.0–0.1)
Basophils Relative: 1 %
Eosinophils Absolute: 0.4 10*3/uL (ref 0.0–0.5)
Eosinophils Relative: 2 %
HCT: 21.6 % — ABNORMAL LOW (ref 36.0–46.0)
Hemoglobin: 7.7 g/dL — ABNORMAL LOW (ref 12.0–15.0)
Immature Granulocytes: 2 %
Lymphocytes Relative: 42 %
Lymphs Abs: 6.8 10*3/uL — ABNORMAL HIGH (ref 0.7–4.0)
MCH: 35.5 pg — ABNORMAL HIGH (ref 26.0–34.0)
MCHC: 35.6 g/dL (ref 30.0–36.0)
MCV: 99.5 fL (ref 80.0–100.0)
Monocytes Absolute: 1.1 10*3/uL — ABNORMAL HIGH (ref 0.1–1.0)
Monocytes Relative: 7 %
Neutro Abs: 7.7 10*3/uL (ref 1.7–7.7)
Neutrophils Relative %: 46 %
Platelets: 366 10*3/uL (ref 150–400)
RBC: 2.17 MIL/uL — ABNORMAL LOW (ref 3.87–5.11)
RDW: 23 % — ABNORMAL HIGH (ref 11.5–15.5)
Smear Review: NORMAL
WBC: 16.4 10*3/uL — ABNORMAL HIGH (ref 4.0–10.5)
nRBC: 2.1 % — ABNORMAL HIGH (ref 0.0–0.2)

## 2019-07-01 LAB — PREGNANCY, URINE: Preg Test, Ur: NEGATIVE

## 2019-07-01 LAB — COMPREHENSIVE METABOLIC PANEL
ALT: 18 U/L (ref 0–44)
AST: 36 U/L (ref 15–41)
Albumin: 4.4 g/dL (ref 3.5–5.0)
Alkaline Phosphatase: 49 U/L (ref 38–126)
Anion gap: 8 (ref 5–15)
BUN: 5 mg/dL — ABNORMAL LOW (ref 6–20)
CO2: 26 mmol/L (ref 22–32)
Calcium: 8.9 mg/dL (ref 8.9–10.3)
Chloride: 105 mmol/L (ref 98–111)
Creatinine, Ser: 0.37 mg/dL — ABNORMAL LOW (ref 0.44–1.00)
GFR calc Af Amer: >60 mL/min (ref >60)
GFR calc non Af Amer: >60 mL/min (ref >60)
Glucose, Bld: 93 mg/dL (ref 70–99)
Potassium: 3.2 mmol/L — ABNORMAL LOW (ref 3.5–5.1)
Sodium: 139 mmol/L (ref 135–145)
Total Bilirubin: 5.3 mg/dL — ABNORMAL HIGH (ref 0.3–1.2)
Total Protein: 7.6 g/dL (ref 6.5–8.1)

## 2019-07-01 LAB — URINALYSIS, COMPLETE (UACMP) WITH MICROSCOPIC
Bacteria, UA: NONE SEEN
Bilirubin Urine: NEGATIVE
Glucose, UA: NEGATIVE mg/dL
Hgb urine dipstick: NEGATIVE
Ketones, ur: NEGATIVE mg/dL
Leukocytes,Ua: NEGATIVE
Nitrite: NEGATIVE
Protein, ur: NEGATIVE mg/dL
Specific Gravity, Urine: 1.01 (ref 1.005–1.030)
pH: 6 (ref 5.0–8.0)

## 2019-07-01 LAB — ABO/RH: ABO/RH(D): A POS

## 2019-07-01 LAB — RETICULOCYTES
Immature Retic Fract: 40.8 % — ABNORMAL HIGH (ref 2.3–15.9)
RBC.: 2.17 MIL/uL — ABNORMAL LOW (ref 3.87–5.11)
Retic Count, Absolute: 317.2 10*3/uL — ABNORMAL HIGH (ref 19.0–186.0)
Retic Ct Pct: 15.7 % — ABNORMAL HIGH (ref 0.4–3.1)

## 2019-07-01 LAB — PREPARE RBC (CROSSMATCH)

## 2019-07-01 LAB — SARS CORONAVIRUS 2 BY RT PCR (HOSPITAL ORDER, PERFORMED IN ~~LOC~~ HOSPITAL LAB): SARS Coronavirus 2: NEGATIVE

## 2019-07-01 MED ORDER — NALOXONE HCL 0.4 MG/ML IJ SOLN: 0.4000 mg | INTRAMUSCULAR | Status: DC | PRN

## 2019-07-01 MED ORDER — DIPHENHYDRAMINE HCL 50 MG/ML IJ SOLN
25.0000 mg | Freq: Once | INTRAMUSCULAR | Status: AC
  Administered 2019-07-01: 18:00:00 25 mg via INTRAVENOUS
  Filled 2019-07-01: qty 1

## 2019-07-01 MED ORDER — DEXTROSE-NACL 5-0.45 % IV SOLN
INTRAVENOUS | Status: AC
  Administered 2019-07-02 (×2): via INTRAVENOUS

## 2019-07-01 MED ORDER — POTASSIUM CHLORIDE CRYS ER 20 MEQ PO TBCR
20.0000 meq | EXTENDED_RELEASE_TABLET | Freq: Once | ORAL | Status: AC
  Administered 2019-07-02: 20 meq via ORAL
  Filled 2019-07-01: qty 1

## 2019-07-01 MED ORDER — FOLIC ACID 1 MG PO TABS
1.0000 mg | ORAL_TABLET | Freq: Every day | ORAL | Status: DC
  Administered 2019-07-02 – 2019-07-06 (×5): 1 mg via ORAL
  Filled 2019-07-01 (×5): qty 1

## 2019-07-01 MED ORDER — HYDROMORPHONE HCL 1 MG/ML IJ SOLN
1.0000 mg | Freq: Once | INTRAMUSCULAR | Status: AC
  Administered 2019-07-01: 1 mg via INTRAVENOUS
  Filled 2019-07-01: qty 1

## 2019-07-01 MED ORDER — SODIUM CHLORIDE 0.9 % IV SOLN
10.0000 mL/h | Freq: Once | INTRAVENOUS | Status: AC
  Administered 2019-07-01: 10 mL/h via INTRAVENOUS

## 2019-07-01 MED ORDER — ENOXAPARIN SODIUM 40 MG/0.4ML ~~LOC~~ SOLN
40.0000 mg | SUBCUTANEOUS | Status: DC
  Administered 2019-07-02 – 2019-07-05 (×4): 40 mg via SUBCUTANEOUS
  Filled 2019-07-01 (×5): qty 0.4

## 2019-07-01 MED ORDER — HYDROMORPHONE 1 MG/ML IV SOLN
INTRAVENOUS | Status: DC
  Administered 2019-07-02: 25 mg via INTRAVENOUS
  Administered 2019-07-02: 2.8 mg via INTRAVENOUS
  Administered 2019-07-02: 1.5 mg via INTRAVENOUS
  Administered 2019-07-02: 30 mg via INTRAVENOUS
  Filled 2019-07-01: qty 30

## 2019-07-01 MED ORDER — HYDROMORPHONE HCL 1 MG/ML IJ SOLN
2.0000 mg | Freq: Once | INTRAMUSCULAR | Status: AC
  Administered 2019-07-01: 17:00:00 2 mg via INTRAVENOUS
  Filled 2019-07-01: qty 2

## 2019-07-01 MED ORDER — SODIUM CHLORIDE 0.9% FLUSH: 9.0000 mL | INTRAVENOUS | Status: DC | PRN

## 2019-07-01 MED ORDER — ONDANSETRON HCL 4 MG/2ML IJ SOLN: 4.0000 mg | Freq: Four times a day (QID) | INTRAMUSCULAR | Status: DC | PRN

## 2019-07-01 MED ORDER — SODIUM CHLORIDE 0.9 % IV BOLUS
1000.0000 mL | Freq: Once | INTRAVENOUS | Status: AC
  Administered 2019-07-01: 16:00:00 1000 mL via INTRAVENOUS

## 2019-07-01 MED ORDER — KETOROLAC TROMETHAMINE 15 MG/ML IJ SOLN
15.0000 mg | Freq: Four times a day (QID) | INTRAMUSCULAR | Status: AC
  Administered 2019-07-02 (×4): 15 mg via INTRAVENOUS
  Filled 2019-07-01 (×5): qty 1

## 2019-07-01 MED ORDER — ONDANSETRON HCL 4 MG/2ML IJ SOLN
4.0000 mg | Freq: Once | INTRAMUSCULAR | Status: AC
  Administered 2019-07-01: 17:00:00 4 mg via INTRAVENOUS
  Filled 2019-07-01: qty 2

## 2019-07-01 MED ORDER — SENNOSIDES-DOCUSATE SODIUM 8.6-50 MG PO TABS
1.0000 | ORAL_TABLET | Freq: Two times a day (BID) | ORAL | Status: DC
  Administered 2019-07-02 – 2019-07-06 (×10): 1 via ORAL
  Filled 2019-07-01 (×10): qty 1

## 2019-07-01 MED ORDER — POLYETHYLENE GLYCOL 3350 17 G PO PACK
17.0000 g | PACK | Freq: Every day | ORAL | Status: DC | PRN
  Administered 2019-07-05: 17 g via ORAL
  Filled 2019-07-01: qty 1

## 2019-07-01 MED ORDER — HYDROMORPHONE HCL 2 MG PO TABS
1.0000 mg | ORAL_TABLET | ORAL | Status: DC | PRN
  Administered 2019-07-01: 1 mg via ORAL
  Filled 2019-07-01: qty 1

## 2019-07-01 NOTE — ED Provider Notes (Signed)
[facility] Emergency Department Provider Note    First MD Initiated Contact with Patient 07/01/19 1603     (approximate)  I have reviewed the triage vital signs and the nursing notes.   HISTORY  Chief Complaint Sickle Cell Pain Crisis    HPI Kara [name] is a 33 y.o. female with below list of previous medical conditions including sickle cell anemia presents to the emergency department secondary to low back and bilateral lower extremity pain which patient states is currently 10 out of 10.  Patient states that symptoms are consistent with previous sickle cell pain crisis.  Review of the chart reveals that the patient was admitted at [facility] in February of this year secondary to severe anemia with a hemoglobin of 5.5 at nadir.  Patient states symptoms today are consistent with that previous admission and she is concerned that about the possibility of needing a blood transfusion which she states I need it.       Past Medical History:  Diagnosis Date  . Arthritis   . Pulmonary hypertension (HCC)   . Sickle cell anemia [facility](HCC)     Patient Active Problem List   Diagnosis Date Noted  . Chronic pain syndrome 12/27/2018  . Anemia of chronic disease 12/27/2018  . Pulmonary hypertension (HCC) 04/04/2018  . Sickle cell disease (HCC) 04/04/2018  . Arthritis 04/04/2018  . Chest pain on breathing   . Leukocytosis 02/06/2018  . Sickle cell crisis (HCC) 02/05/2018  . Sickle cell pain crisis (HCC) 10/29/2017    Past Surgical History:  Procedure Laterality Date  . DILATION AND CURETTAGE OF UTERUS    . GALLBLADDER SURGERY      Prior to Admission medications   Medication Sig Start Date End Date Taking? Authorizing Provider  acetaminophen (TYLENOL) 500 MG tablet Take 1,000 mg by mouth every 6 (six) hours as needed for mild pain.    [provider]  fluticasone (FLONASE) 50 MCG/ACT nasal spray Place 2 sprays into both nostrils daily. 02/20/19   Menshew,  Charlesetta IvoryJenise V Bacon, PA-C  folic acid (FOLVITE) 1 MG tablet Take 1 tablet (1 mg total) by mouth daily. 12/31/18   Massie MaroonHollis, Lachina M, FNP  hydrOXYzine (VISTARIL) 25 MG capsule Take 1 capsule (25 mg total) by mouth 3 (three) times daily as needed for itching. 02/20/19   Menshew, Charlesetta IvoryJenise V Bacon, PA-C  neomycin-polymyxin b-dexamethasone (MAXITROL) 3.5-10000-0.1 SUSP Place 2 drops into the right eye every 6 (six) hours. 05/12/19   Enid DerryWagner, Ashley, PA-C    Allergies Kiwi extract, Morphine and related, and Paroxetine  Family History  Adopted: Yes    Social History Social History   Tobacco Use  . Smoking status: Current Every Day Smoker  . Smokeless tobacco: Never Used  Substance Use Topics  . Alcohol use: No    Frequency: Never  . Drug use: Not on file    Review of Systems Constitutional: No fever/chills Eyes: No visual changes. ENT: No sore throat. Cardiovascular: Denies chest pain. Respiratory: Denies shortness of breath. Gastrointestinal: No abdominal pain.  No nausea, no vomiting.  No diarrhea.  No constipation. Genitourinary: Negative for dysuria. Musculoskeletal: Negative for neck pain.  Positive for back pain.  Positive for bilateral lower extremity pain Integumentary: Negative for rash. Neurological: Negative for headaches, focal weakness or numbness.  ____________________________________________   PHYSICAL EXAM:  VITAL SIGNS: ED Triage Vitals  Enc Vitals Group     BP 07/01/19 1435 126/62     Pulse Rate 07/01/19 1435 72  Resp 07/01/19 1435 20     Temp 07/01/19 1435 98.4 F (36.9 C)     Temp Source 07/01/19 1435 Oral     SpO2 07/01/19 1435 96 %     Weight 07/01/19 1435 52.2 kg (115 lb)     Height 07/01/19 1435 1.626 m (5\' 4" )     Head Circumference --      Peak Flow --      Pain Score 07/01/19 1439 9     Pain Loc --      Pain Edu? --      Excl. in GC? --     Constitutional: Alert and oriented.  Eyes: Conjunctivae are normal.  Mouth/Throat: Mucous membranes are  moist. Neck: No stridor.  No meningeal signs.   Cardiovascular: Normal rate, regular rhythm. Good peripheral circulation. Grossly normal heart sounds. Respiratory: Normal respiratory effort.  No retractions. Gastrointestinal: Soft and nontender. No distention.  Musculoskeletal: No lower extremity tenderness nor edema. No gross deformities of extremities. Neurologic:  Normal speech and language. No gross focal neurologic deficits are appreciated.  Skin:  Skin is warm, dry and intact. Psychiatric: Mood and affect are normal. Speech and behavior are normal.  ____________________________________________   LABS (all labs ordered are listed, but only abnormal results are displayed)  Labs Reviewed  COMPREHENSIVE METABOLIC PANEL - Abnormal; Notable for the following components:      Result Value   Potassium 3.2 (*)    BUN 5 (*)    Creatinine, Ser 0.37 (*)    Total Bilirubin 5.3 (*)    All other components within normal limits  CBC WITH DIFFERENTIAL/PLATELET - Abnormal; Notable for the following components:   WBC 16.4 (*)    RBC 2.17 (*)    Hemoglobin 7.7 (*)    HCT 21.6 (*)    MCH 35.5 (*)    RDW 23.0 (*)    nRBC 2.1 (*)    Lymphs Abs 6.8 (*)    Monocytes Absolute 1.1 (*)    Abs Immature Granulocytes 0.24 (*)    All other components within normal limits  RETICULOCYTES - Abnormal; Notable for the following components:   Retic Ct Pct 15.7 (*)    RBC. 2.17 (*)    Retic Count, Absolute 317.2 (*)    Immature Retic Fract 40.8 (*)    All other components within normal limits  URINALYSIS, COMPLETE (UACMP) WITH MICROSCOPIC - Abnormal; Notable for the following components:   Color, Urine YELLOW (*)    APPearance HAZY (*)    All other components within normal limits  SARS CORONAVIRUS 2 (HOSPITAL ORDER, PERFORMED IN [facility])  POC URINE PREG, ED  TYPE AND SCREEN  PREPARE RBC (CROSSMATCH)   ____  RADIOLOGY I, [place] N Rodriques Badie, personally viewed and evaluated these  images (plain radiographs) as part of my medical decision making, as well as reviewing the written report by the radiologist.  ED MD interpretation: Mild stable cardiac enlargement noted on chest x-ray per radiologist.  Official radiology report(s): Dg Chest Portable 1 View  Result Date: 07/01/2019 CLINICAL DATA:  Back and leg pain for 2 days. History of sickle cell disease. EXAM: PORTABLE CHEST 1 VIEW COMPARISON:  Chest x-ray 12/27/2018 FINDINGS: The heart is borderline enlarged but stable. The mediastinal and hilar contours are within normal limits and unchanged. The lungs demonstrate slight hyperinflation but no infiltrates or effusions. The bony thorax is intact. IMPRESSION: No acute cardiopulmonary findings.  Mild stable cardiac enlargement. Electronically Signed   By:  Marijo Sanes M.D.   On: 07/01/2019 17:12     Procedures   ____________________________________________   INITIAL IMPRESSION / MDM / ASSESSMENT AND PLAN / ED COURSE  As part of my medical decision making, I reviewed the following data within the electronic MEDICAL RECORD NUMBER 33 year old female presented with above-stated history and physical exam consistent with acute sickle cell pain crisis.  Patient's laboratory data notable for hemoglobin of 7.7 patient states that her baseline hemoglobin is usually 12. Reticulocyte count percentage 15.7 absolute 317.2 with an immature reticulocyte fraction of 40.8.  Patient given multiple doses of IV Dilaudid in the emergency department with minimal improvement in pain.  Patient discussed with Dr. Marylyn Ishihara at [facility] for hospital admission as per hospital system plan for sickle cell inpatient admission.   ____________________________________________  FINAL CLINICAL IMPRESSION(S) / ED DIAGNOSES  Final diagnoses:  Sickle cell pain crisis ([place])     MEDICATIONS GIVEN DURING THIS VISIT:  Medications  diphenhydrAMINE (BENADRYL) injection 25 mg (25 mg Intravenous Refused 07/01/19  1634)  0.9 %  sodium chloride infusion (has no administration in time range)  HYDROmorphone (DILAUDID) injection 1 mg (1 mg Intravenous Given 07/01/19 1632)  ondansetron (ZOFRAN) injection 4 mg (4 mg Intravenous Given 07/01/19 1630)  sodium chloride 0.9 % bolus 1,000 mL (1,000 mLs Intravenous New Bag/Given 07/01/19 1629)  HYDROmorphone (DILAUDID) injection 2 mg (2 mg Intravenous Given 07/01/19 1659)     ED Discharge Orders    None      *Please note:  Kemba Hoppes was evaluated in Emergency Department on 07/01/2019 for the symptoms described in the history of present illness. She was evaluated in the context of the global COVID-19 pandemic, which necessitated consideration that the patient might be at risk for infection with the SARS-CoV-2 virus that causes COVID-19. Institutional protocols and algorithms that pertain to the evaluation of patients at risk for COVID-19 are in a state of rapid change based on information released by regulatory bodies including the CDC and federal and state organizations. These policies and algorithms were followed during the patient's care in the ED.  Some ED evaluations and interventions may be delayed as a result of limited staffing during the pandemic.*  Note:  This document was prepared using Dragon voice recognition software and may include unintentional dictation errors.   Gregor Hams, MD 07/01/19 [phone]

## 2019-07-01 NOTE — ED Notes (Signed)
Attempted to call report, nurse unavailable at this time.

## 2019-07-01 NOTE — ED Notes (Signed)
Full rainbow drawn and sent to lab. IV team consult placed due to history of ultrasound IVs.

## 2019-07-01 NOTE — ED Triage Notes (Signed)
Patient reports back pain and leg pain x2 days. No relief with ibuprofen. History of sick cell disease.

## 2019-07-01 NOTE — ED Notes (Signed)
Pt urine poc negative

## 2019-07-01 NOTE — H&P (Signed)
History and Physical    Kara KirksSherry [name] HYQ:[id number] DOB: 1986-07-28 DOA: 07/01/2019  PCP: Patient, No Pcp Per  Patient coming from: Home.  Chief Complaint: Pain.  HPI: Kara [name] is a 33 y.o. female with history of sickle cell anemia pulmonary hypertension presents to the ER at [facility] with generalized body ache typical of her sickle cell pain crisis over the last 2 days.  Also has some chest discomfort denies any fever chills or productive cough.  Denies abdominal pain nausea vomiting or diarrhea.  Not any headache or visual symptoms.  Patient states he has been recently diagnosed with right eye inflammatory finding for which she takes ointment and she does not recall the name of it.  At this time patient does not have any eye symptoms.  ED Course: Labs showed leukocytosis with a WBC of 16.4 hemoglobin 7.7.  Chest x-ray unremarkable patient afebrile COVID-19 nose negative.  Patient was given Dilaudid despite which patient's pain was still persistent was admitted for sickle cell pain crisis.  Patient was started on blood transfusion 1 unit on the way to [facility].  Review of Systems: As per HPI, rest all negative.   Past Medical History:  Diagnosis Date  . Arthritis   . Pulmonary hypertension (HCC)   . Sickle cell anemia (HCC)     Past Surgical History:  Procedure Laterality Date  . DILATION AND CURETTAGE OF UTERUS    . GALLBLADDER SURGERY       reports that she has been smoking. She has never used smokeless tobacco. She reports that she does not drink alcohol. No history on file for drug.  Allergies  Allergen Reactions  . Kiwi Extract Hives  . Morphine And Related Hives  . Paroxetine Hives    Family History  Adopted: Yes    Prior to Admission medications   Medication Sig Start Date End Date Taking? Authorizing Provider  acetaminophen (TYLENOL) 500 MG tablet Take 1,000 mg by mouth every 6 (six) hours as needed for mild pain.     [provider]  fluticasone (FLONASE) 50 MCG/ACT nasal spray Place 2 sprays into both nostrils daily. 02/20/19   Menshew, Charlesetta IvoryJenise V Bacon, PA-C  folic acid (FOLVITE) 1 MG tablet Take 1 tablet (1 mg total) by mouth daily. 12/31/18   Massie MaroonHollis, Lachina M, FNP  hydrOXYzine (VISTARIL) 25 MG capsule Take 1 capsule (25 mg total) by mouth 3 (three) times daily as needed for itching. 02/20/19   Menshew, Charlesetta IvoryJenise V Bacon, PA-C  neomycin-polymyxin b-dexamethasone (MAXITROL) 3.5-10000-0.1 SUSP Place 2 drops into the right eye every 6 (six) hours. 05/12/19   Enid DerryWagner, Ashley, PA-C    Physical Exam: Constitutional: Moderately built and nourished. There were no vitals filed for this visit.  Blood pressure is 119/62 pulse 69/min respiration 18/min temperature 98.3. Eyes: Anicteric no pallor ENMT: No discharge from the ears eyes nose or mouth. Neck: No mass or.  No neck rigidity. Respiratory: No rhonchi or crepitations. Cardiovascular: S1-S2 heard. Abdomen: Soft nontender bowel sounds are seen. Musculoskeletal: No edema. Skin: No rash. Neurologic: Alert awake oriented to time place and person.  Moves all extremities. Psychiatric: Appears normal per normal affect.   Labs on Admission: I have personally reviewed following labs and imaging studies  CBC: Recent Labs  Lab 07/01/19 1445  WBC 16.4*  NEUTROABS 7.7  HGB 7.7*  HCT 21.6*  MCV 99.5  PLT 366   Basic Metabolic Panel: Recent Labs  Lab 07/01/19 1445  NA 139  K 3.2*  CL 105  CO2 26  GLUCOSE 93  BUN 5*  CREATININE 0.37*  CALCIUM 8.9   GFR: Estimated Creatinine Clearance: 72.5 mL/min (A) (by C-G formula based on SCr of 0.37 mg/dL (L)). Liver Function Tests: Recent Labs  Lab 07/01/19 1445  AST 36  ALT 18  ALKPHOS 49  BILITOT 5.3*  PROT 7.6  ALBUMIN 4.4   No results for input(s): LIPASE, AMYLASE in the last 168 hours. No results for input(s): AMMONIA in the last 168 hours. Coagulation Profile: No results for input(s): INR,  PROTIME in the last 168 hours. Cardiac Enzymes: No results for input(s): CKTOTAL, CKMB, CKMBINDEX, TROPONINI in the last 168 hours. BNP (last 3 results) No results for input(s): PROBNP in the last 8760 hours. HbA1C: No results for input(s): HGBA1C in the last 72 hours. CBG: No results for input(s): GLUCAP in the last 168 hours. Lipid Profile: No results for input(s): CHOL, HDL, LDLCALC, TRIG, CHOLHDL, LDLDIRECT in the last 72 hours. Thyroid Function Tests: No results for input(s): TSH, T4TOTAL, FREET4, T3FREE, THYROIDAB in the last 72 hours. Anemia Panel: Recent Labs    07/01/19 1445  RETICCTPCT 15.7*   Urine analysis:    Component Value Date/Time   COLORURINE YELLOW (A) 07/01/2019 1704   APPEARANCEUR HAZY (A) 07/01/2019 1704   LABSPEC 1.010 07/01/2019 1704   PHURINE 6.0 07/01/2019 1704   GLUCOSEU NEGATIVE 07/01/2019 1704   HGBUR NEGATIVE 07/01/2019 1704   BILIRUBINUR NEGATIVE 07/01/2019 1704   KETONESUR NEGATIVE 07/01/2019 1704   PROTEINUR NEGATIVE 07/01/2019 1704   NITRITE NEGATIVE 07/01/2019 1704   LEUKOCYTESUR NEGATIVE 07/01/2019 1704   Sepsis Labs: @LABRCNTIP (procalcitonin:4,lacticidven:4) ) Recent Results (from the past 240 hour(s))  SARS Coronavirus 2 [facility](Hospital order, Performed in [facility] hospital lab) Nasopharyngeal Nasopharyngeal Swab     Status: None   Collection Time: 07/01/19  5:25 PM   Specimen: Nasopharyngeal Swab  Result Value Ref Range Status   SARS Coronavirus 2 NEGATIVE NEGATIVE Final    Comment: (NOTE) If result is NEGATIVE SARS-CoV-2 target nucleic acids are NOT DETECTED. The SARS-CoV-2 RNA is generally detectable in upper and lower  respiratory specimens during the acute phase of infection. The lowest  concentration of SARS-CoV-2 viral copies this assay can detect is 250  copies / mL. A negative result does not preclude SARS-CoV-2 infection  and should not be used as the sole basis for treatment or other  patient management decisions.  A  negative result may occur with  improper specimen collection / handling, submission of specimen other  than nasopharyngeal swab, presence of viral mutation(s) within the  areas targeted by this assay, and inadequate number of viral copies  (<250 copies / mL). A negative result must be combined with clinical  observations, patient history, and epidemiological information. If result is POSITIVE SARS-CoV-2 target nucleic acids are DETECTED. The SARS-CoV-2 RNA is generally detectable in upper and lower  respiratory specimens dur ing the acute phase of infection.  Positive  results are indicative of active infection with SARS-CoV-2.  Clinical  correlation with patient history and other diagnostic information is  necessary to determine patient infection status.  Positive results do  not rule out bacterial infection or co-infection with other viruses. If result is PRESUMPTIVE POSTIVE SARS-CoV-2 nucleic acids MAY BE PRESENT.   A presumptive positive result was obtained on the submitted specimen  and confirmed on repeat testing.  While 2019 novel coronavirus  (SARS-CoV-2) nucleic acids may be present in the submitted sample  additional  confirmatory testing may be necessary for epidemiological  and / or clinical management purposes  to differentiate between  SARS-CoV-2 and other Sarbecovirus currently known to infect humans.  If clinically indicated additional testing with an alternate test  methodology [phone]) is advised. The SARS-CoV-2 RNA is generally  detectable in upper and lower respiratory sp ecimens during the acute  phase of infection. The expected result is Negative. Fact Sheet for Patients:  StrictlyIdeas.no Fact Sheet for Healthcare Providers: BankingDealers.co.za This test is not yet approved or cleared by the [place] FDA and has been authorized for detection and/or diagnosis of SARS-CoV-2 by FDA under an Emergency Use  Authorization (EUA).  This EUA will remain in effect (meaning this test can be used) for the duration of the COVID-19 declaration under Section 564(b)(1) of the Act, 21 U.S.C. section 360bbb-3(b)(1), unless the authorization is terminated or revoked sooner. Performed at [facility], [address], [place], [place] [address]      Radiological Exams on Admission: Dg Chest Portable 1 View  Result Date: 07/01/2019 CLINICAL DATA:  Back and leg pain for 2 days. History of sickle cell disease. EXAM: PORTABLE CHEST 1 VIEW COMPARISON:  Chest x-ray 12/27/2018 FINDINGS: The heart is borderline enlarged but stable. The mediastinal and hilar contours are within normal limits and unchanged. The lungs demonstrate slight hyperinflation but no infiltrates or effusions. The bony thorax is intact. IMPRESSION: No acute cardiopulmonary findings.  Mild stable cardiac enlargement. Electronically Signed   By: Marijo Sanes M.D.   On: 07/01/2019 17:12    Assessment/Plan Principal Problem:   Sickle cell pain crisis ([place]) Active Problems:   Pulmonary hypertension (HCC)   Anemia of chronic disease   Sickle cell anemia ([place])    1. Sickle cell pain crisis -we will keep patient on Dilaudid PCA Toradol (once pregnancy screen is negative).  IV fluids. 2. Anemia appears to be chronic from sickle cell disease.  [place] had ordered 1 unit of PRBC.  Follow CBC. 3. History of pulmonary pretension no acute issues at this time. 4. Recent diagnosis of right eye inflammatory changes but on exam patient has no different tearing or congestion.  Patient is trying to find the medication she was on.  Given that patient will need PCA for pain relief control from sickle cell pain crisis patient be admitted as inpatient.   DVT prophylaxis: Lovenox. Code Status: Full code. Family Communication: Discussed with patient. Disposition Plan: Home. Consults called: None. Admission status: Inpatient.   Rise Patience MD  [facility] Pager [phone].  If 7PM-7AM, please contact night-coverage [email or web address] Password [facility]  07/01/2019, 10:25 PM

## 2019-07-01 NOTE — ED Notes (Signed)
CARELINK  CALLED PER  DR  Sabino Gasser  MD

## 2019-07-01 NOTE — ED Notes (Signed)
Documentation to this point done by this RN under Threasa Beards, EDT epic account

## 2019-07-02 DIAGNOSIS — I272 Pulmonary hypertension, unspecified: Secondary | ICD-10-CM

## 2019-07-02 LAB — CBC WITH DIFFERENTIAL/PLATELET
Abs Immature Granulocytes: 0.12 10*3/uL — ABNORMAL HIGH (ref 0.00–0.07)
Basophils Absolute: 0.1 10*3/uL (ref 0.0–0.1)
Basophils Relative: 1 %
Eosinophils Absolute: 0.4 10*3/uL (ref 0.0–0.5)
Eosinophils Relative: 2 %
HCT: 23.9 % — ABNORMAL LOW (ref 36.0–46.0)
Hemoglobin: 8.2 g/dL — ABNORMAL LOW (ref 12.0–15.0)
Immature Granulocytes: 1 %
Lymphocytes Relative: 67 %
Lymphs Abs: 9.9 10*3/uL — ABNORMAL HIGH (ref 0.7–4.0)
MCH: 34 pg (ref 26.0–34.0)
MCHC: 34.3 g/dL (ref 30.0–36.0)
MCV: 99.2 fL (ref 80.0–100.0)
Monocytes Absolute: 1.1 10*3/uL — ABNORMAL HIGH (ref 0.1–1.0)
Monocytes Relative: 8 %
Neutro Abs: 3 10*3/uL (ref 1.7–7.7)
Neutrophils Relative %: 21 %
Platelets: 285 10*3/uL (ref 150–400)
RBC: 2.41 MIL/uL — ABNORMAL LOW (ref 3.87–5.11)
RDW: 21.2 % — ABNORMAL HIGH (ref 11.5–15.5)
WBC: 14.6 10*3/uL — ABNORMAL HIGH (ref 4.0–10.5)
nRBC: 2.5 % — ABNORMAL HIGH (ref 0.0–0.2)

## 2019-07-02 LAB — TYPE AND SCREEN
ABO/RH(D): A POS
Antibody Screen: NEGATIVE
Donor AG Type: NEGATIVE
Unit division: 0
Unit division: 0

## 2019-07-02 LAB — HIV ANTIBODY (ROUTINE TESTING W REFLEX): HIV Screen 4th Generation wRfx: NONREACTIVE

## 2019-07-02 LAB — BPAM RBC
Blood Product Expiration Date: 202008242359
Blood Product Expiration Date: 202008312359
ISSUE DATE / TIME: 202008111951
ISSUE DATE / TIME: 202008111951
Unit Type and Rh: 6200
Unit Type and Rh: 6200

## 2019-07-02 MED ORDER — METHOCARBAMOL 500 MG PO TABS
500.0000 mg | ORAL_TABLET | Freq: Four times a day (QID) | ORAL | Status: DC
  Administered 2019-07-02 – 2019-07-06 (×15): 500 mg via ORAL
  Filled 2019-07-02 (×16): qty 1

## 2019-07-02 MED ORDER — ACETAMINOPHEN 325 MG PO TABS
650.0000 mg | ORAL_TABLET | ORAL | Status: DC | PRN
  Administered 2019-07-02 – 2019-07-05 (×5): 650 mg via ORAL
  Filled 2019-07-02 (×5): qty 2

## 2019-07-02 MED ORDER — HYDROMORPHONE 1 MG/ML IV SOLN
INTRAVENOUS | Status: DC
  Administered 2019-07-02: 0.5 mg via INTRAVENOUS
  Administered 2019-07-02: 2.7 mg via INTRAVENOUS
  Administered 2019-07-02: 2.09 mg via INTRAVENOUS
  Administered 2019-07-03: 0 via INTRAVENOUS
  Administered 2019-07-03: 1.5 mg via INTRAVENOUS
  Administered 2019-07-03: 2 mg via INTRAVENOUS
  Filled 2019-07-02: qty 30

## 2019-07-02 MED ORDER — CLONAZEPAM 0.5 MG PO TBDP
0.5000 mg | ORAL_TABLET | Freq: Two times a day (BID) | ORAL | Status: DC
  Administered 2019-07-02 – 2019-07-06 (×9): 0.5 mg via ORAL
  Filled 2019-07-02 (×9): qty 1

## 2019-07-02 NOTE — Progress Notes (Signed)
Patient ID: Kara [name], female   DOB: May 06, 1986, 33 y.o.   MRN: [id number] Subjective: Kara [name]  is a 33 y.o. female with a medical history significant for sickle cell anemia, chronic pain syndrome, pulmonary hypertension, anxiety and depression who was admitted yesterday for sickle cell pain crisis worker via [facility] emergency room.  Patient received 1 unit of packed red blood cell for hemoglobin of 7.7.  Today patient reports ongoing headache that started since yesterday.  Patient has been stressed lately due to death of a very close friend, today is the phone around.  Patient has been very emotional and completely stressed out, manifesting as severe headache like a band around her head.  There is no associated nausea or vomiting, no blurry vision, no photophobia or phonophobia.  Patient denies any fever, chest pain, cough or shortness of breath. She does not have a PCP. She infrequently visit ED for pain management.  Objective:  Vital signs in last 24 hours:  Vitals:   07/02/19 1226 07/02/19 1230 07/02/19 1337 07/02/19 1644  BP:   102/60   Pulse:   91   Resp: (!) 26 (!) 22 19 17   Temp:   98.2 F (36.8 C)   TempSrc:   Oral   SpO2: 100%  96% 96%    Intake/Output from previous day:   Intake/Output Summary (Last 24 hours) at 07/02/2019 1707 Last data filed at 07/02/2019 1300 Gross per 24 hour  Intake 1380 ml  Output 900 ml  Net 480 ml    Physical Exam: General: Alert, awake, oriented x3, in no acute distress.  HEENT: [place]/AT PEERL, EOMI Neck: Trachea midline,  no masses, no thyromegal,y no JVD, no carotid bruit OROPHARYNX:  Moist, No exudate/ erythema/lesions.  Heart: Regular rate and rhythm, without murmurs, rubs, gallops, PMI non-displaced, no heaves or thrills on palpation.  Lungs: Clear to auscultation, no wheezing or rhonchi noted. No increased vocal fremitus resonant to percussion  Abdomen: Soft, nontender, nondistended, positive bowel sounds,  no masses no hepatosplenomegaly noted..  Neuro: No focal neurological deficits noted cranial nerves II through XII grossly intact. DTRs 2+ bilaterally upper and lower extremities. Strength 5 out of 5 in bilateral upper and lower extremities. Musculoskeletal: No warm swelling or erythema around joints, no spinal tenderness noted. Psychiatric: Patient alert and oriented x3, good insight and cognition, good recent to remote recall. Lymph node survey: No cervical axillary or inguinal lymphadenopathy noted.  Lab Results:  Basic Metabolic Panel:    Component Value Date/Time   NA 139 07/01/2019 1445   K 3.2 (L) 07/01/2019 1445   CL 105 07/01/2019 1445   CO2 26 07/01/2019 1445   BUN 5 (L) 07/01/2019 1445   CREATININE 0.37 (L) 07/01/2019 1445   GLUCOSE 93 07/01/2019 1445   CALCIUM 8.9 07/01/2019 1445   CBC:    Component Value Date/Time   WBC 14.6 (H) 07/02/2019 0435   HGB 8.2 (L) 07/02/2019 0435   HCT 23.9 (L) 07/02/2019 0435   PLT 285 07/02/2019 0435   MCV 99.2 07/02/2019 0435   NEUTROABS 3.0 07/02/2019 0435   LYMPHSABS 9.9 (H) 07/02/2019 0435   MONOABS 1.1 (H) 07/02/2019 0435   EOSABS 0.4 07/02/2019 0435   BASOSABS 0.1 07/02/2019 0435    Recent Results (from the past 240 hour(s))  SARS Coronavirus 2 [facility] order, Performed in [facility] hospital lab) Nasopharyngeal Nasopharyngeal Swab     Status: None   Collection Time: 07/01/19  5:25 PM   Specimen: Nasopharyngeal Swab  Result Value Ref Range Status   SARS Coronavirus 2 NEGATIVE NEGATIVE Final    Comment: (NOTE) If result is NEGATIVE SARS-CoV-2 target nucleic acids are NOT DETECTED. The SARS-CoV-2 RNA is generally detectable in upper and lower  respiratory specimens during the acute phase of infection. The lowest  concentration of SARS-CoV-2 viral copies this assay can detect is 250  copies / mL. A negative result does not preclude SARS-CoV-2 infection  and should not be used as the sole basis for treatment or other   patient management decisions.  A negative result may occur with  improper specimen collection / handling, submission of specimen other  than nasopharyngeal swab, presence of viral mutation(s) within the  areas targeted by this assay, and inadequate number of viral copies  (<250 copies / mL). A negative result must be combined with clinical  observations, patient history, and epidemiological information. If result is POSITIVE SARS-CoV-2 target nucleic acids are DETECTED. The SARS-CoV-2 RNA is generally detectable in upper and lower  respiratory specimens dur ing the acute phase of infection.  Positive  results are indicative of active infection with SARS-CoV-2.  Clinical  correlation with patient history and other diagnostic information is  necessary to determine patient infection status.  Positive results do  not rule out bacterial infection or co-infection with other viruses. If result is PRESUMPTIVE POSTIVE SARS-CoV-2 nucleic acids MAY BE PRESENT.   A presumptive positive result was obtained on the submitted specimen  and confirmed on repeat testing.  While 2019 novel coronavirus  (SARS-CoV-2) nucleic acids may be present in the submitted sample  additional confirmatory testing may be necessary for epidemiological  and / or clinical management purposes  to differentiate between  SARS-CoV-2 and other Sarbecovirus currently known to infect humans.  If clinically indicated additional testing with an alternate test  methodology [phone]([id number]) is advised. The SARS-CoV-2 RNA is generally  detectable in upper and lower respiratory sp ecimens during the acute  phase of infection. The expected result is Negative. Fact Sheet for Patients:  BoilerBrush.com.cyhttps://www.fda.gov/media/136312/download Fact Sheet for Healthcare Providers: https://pope.com/https://www.fda.gov/media/136313/download This test is not yet approved or cleared by the [place] FDA and has been authorized for detection and/or diagnosis of SARS-CoV-2  by FDA under an Emergency Use Authorization (EUA).  This EUA will remain in effect (meaning this test can be used) for the duration of the COVID-19 declaration under Section 564(b)(1) of the Act, 21 U.S.C. section 360bbb-3(b)(1), unless the authorization is terminated or revoked sooner. Performed at [facility], [address] [place], [place] [address]     Studies/Results: Dg Chest Portable 1 View  Result Date: 07/01/2019 CLINICAL DATA:  Back and leg pain for 2 days. History of sickle cell disease. EXAM: PORTABLE CHEST 1 VIEW COMPARISON:  Chest x-ray 12/27/2018 FINDINGS: The heart is borderline enlarged but stable. The mediastinal and hilar contours are within normal limits and unchanged. The lungs demonstrate slight hyperinflation but no infiltrates or effusions. The bony thorax is intact. IMPRESSION: No acute cardiopulmonary findings.  Mild stable cardiac enlargement. Electronically Signed   By: Rudie MeyerP.  Gallerani M.D.   On: 07/01/2019 17:12    Medications: Scheduled Meds: . clonazePAM  0.5 mg Oral BID  . enoxaparin (LOVENOX) injection  40 mg Subcutaneous Q24H  . folic acid  1 mg Oral Daily  . HYDROmorphone   Intravenous Q4H  . ketorolac  15 mg Intravenous Q6H  . senna-docusate  1 tablet Oral BID   Continuous Infusions: . dextrose 5 % and 0.45% NaCl  125 mL/hr at 07/02/19 1310   PRN Meds:.acetaminophen, HYDROmorphone, naloxone **AND** sodium chloride flush, ondansetron (ZOFRAN) IV, polyethylene glycol  Assessment/Plan: Principal Problem:   Sickle cell pain crisis (HCC) Active Problems:   Pulmonary hypertension (HCC)   Anemia of chronic disease   Sickle cell anemia (HCC)  1. Hb Sickle Cell Disease with crisis: Change IV fluid to half-normal saline from 0.9 normal saline @ 125 mls/hour, adjust weight based Dilaudid PCA to reflect true dosages, heart clinician assisted doses of Dilaudid, reduce IV Toradol to 15 mg Q 6 H, Monitor vitals very closely, Re-evaluate pain scale  regularly, 2 L of Oxygen by Zinc. 2. Tension headache: Acetaminophen as needed.  Klonopin for anxiety. 3. Leukocytosis: Most likely reactive. No clinical indication for antibiotics.  We will continue to monitor 4. Sickle Cell Anemia: Hemoglobin is at baseline.  No indication for blood transfusion today.  Will monitor H&H. 5. Chronic pain Syndrome: Add home pain medication. 6. Generalized anxiety disorder: Resume Klonopin 0.5 mg every 12 hours.  Patient counseled extensively.    Code Status: Full Code Family Communication: N/A Disposition Plan: Not yet ready for discharge  Marcello Tuzzolino  If 7PM-7AM, please contact night-coverage.  07/02/2019, 5:07 PM  LOS: 1 day

## 2019-07-03 LAB — BASIC METABOLIC PANEL
Anion gap: 8 (ref 5–15)
BUN: 6 mg/dL (ref 6–20)
CO2: 27 mmol/L (ref 22–32)
Calcium: 8.8 mg/dL — ABNORMAL LOW (ref 8.9–10.3)
Chloride: 102 mmol/L (ref 98–111)
Creatinine, Ser: 0.35 mg/dL — ABNORMAL LOW (ref 0.44–1.00)
GFR calc Af Amer: >60 mL/min (ref >60)
GFR calc non Af Amer: >60 mL/min (ref >60)
Glucose, Bld: 93 mg/dL (ref 70–99)
Potassium: 4.1 mmol/L (ref 3.5–5.1)
Sodium: 137 mmol/L (ref 135–145)

## 2019-07-03 MED ORDER — OXYCODONE HCL 5 MG PO TABS
10.0000 mg | ORAL_TABLET | ORAL | Status: DC | PRN
  Administered 2019-07-03 – 2019-07-05 (×7): 10 mg via ORAL
  Filled 2019-07-03 (×7): qty 2

## 2019-07-03 MED ORDER — HYDROMORPHONE 1 MG/ML IV SOLN
INTRAVENOUS | Status: DC
  Administered 2019-07-03: 2 mg via INTRAVENOUS
  Administered 2019-07-03 (×2): 1 mg via INTRAVENOUS
  Administered 2019-07-04 (×2): 1.5 mg via INTRAVENOUS
  Administered 2019-07-04: 0.5 mg via INTRAVENOUS
  Administered 2019-07-04: 2.5 mg via INTRAVENOUS
  Administered 2019-07-04: 0.5 mg via INTRAVENOUS
  Administered 2019-07-04: 30 mg via INTRAVENOUS
  Administered 2019-07-05: 1.5 mg via INTRAVENOUS
  Administered 2019-07-05: 1 mg via INTRAVENOUS
  Administered 2019-07-05 – 2019-07-06 (×4): 0.5 mg via INTRAVENOUS
  Administered 2019-07-06: 0 mg via INTRAVENOUS
  Filled 2019-07-03: qty 30

## 2019-07-03 NOTE — Progress Notes (Signed)
Subjective: Kara [name], a 33 year old female with a medical history significant for sickle cell anemia, chronic pain syndrome, pulmonary hypertension, generalized anxiety, and depression was admitted for sickle cell pain crisis.  Patient states that pain is improved minimally overnight.  Pain intensity 7/10 primarily to low back and lower extremities.  Patient continues to complain of a headache that feels like a band is tightening around her head.  She denies blurred vision, dizziness, paresthesias, blurred vision, photophobia, or phonophobia. She also denies nausea, vomiting, diarrhea, abdominal pain, shortness of breath, or chest pains.  Patient afebrile and maintaining oxygen saturation at 97% on RA. Objective:  Vital signs in last 24 hours:  Vitals:   07/03/19 0606 07/03/19 0832 07/03/19 1100 07/03/19 1254  BP: 112/76  106/60   Pulse: 91  81   Resp: 16 16 15 15   Temp: 98.6 F (37 C)  98.5 F (36.9 C)   TempSrc: Oral  Oral   SpO2: 100% 100% 97% 99%  Weight:      Height:        Intake/Output from previous day:   Intake/Output Summary (Last 24 hours) at 07/03/2019 1306 Last data filed at 07/03/2019 8841 Gross per 24 hour  Intake 1773.62 ml  Output 1300 ml  Net 473.62 ml    Physical Exam: General: Alert, awake, oriented x3, in no acute distress.  HEENT: [place]/AT PEERL, EOMI Neck: Trachea midline,  no masses, no thyromegal,y no JVD, no carotid bruit OROPHARYNX:  Moist, No exudate/ erythema/lesions.  Heart: Regular rate and rhythm, without murmurs, rubs, gallops, PMI non-displaced, no heaves or thrills on palpation.  Lungs: Clear to auscultation, no wheezing or rhonchi noted. No increased vocal fremitus resonant to percussion  Abdomen: Soft, nontender, nondistended, positive bowel sounds, no masses no hepatosplenomegaly noted..  Neuro: No focal neurological deficits noted cranial nerves II through XII grossly intact. DTRs 2+ bilaterally upper and lower extremities. Strength 5  out of 5 in bilateral upper and lower extremities. Musculoskeletal: No warm swelling or erythema around joints, no spinal tenderness noted. Psychiatric: Patient alert and oriented x3, good insight and cognition, good recent to remote recall. Lymph node survey: No cervical axillary or inguinal lymphadenopathy noted.  Lab Results:  Basic Metabolic Panel:    Component Value Date/Time   NA 137 07/03/2019 0432   K 4.1 07/03/2019 0432   CL 102 07/03/2019 0432   CO2 27 07/03/2019 0432   BUN 6 07/03/2019 0432   CREATININE 0.35 (L) 07/03/2019 0432   GLUCOSE 93 07/03/2019 0432   CALCIUM 8.8 (L) 07/03/2019 0432   CBC:    Component Value Date/Time   WBC 14.6 (H) 07/02/2019 0435   HGB 8.2 (L) 07/02/2019 0435   HCT 23.9 (L) 07/02/2019 0435   PLT 285 07/02/2019 0435   MCV 99.2 07/02/2019 0435   NEUTROABS 3.0 07/02/2019 0435   LYMPHSABS 9.9 (H) 07/02/2019 0435   MONOABS 1.1 (H) 07/02/2019 0435   EOSABS 0.4 07/02/2019 0435   BASOSABS 0.1 07/02/2019 0435    Recent Results (from the past 240 hour(s))  SARS Coronavirus 2 [facility] order, Performed in [facility] hospital lab) Nasopharyngeal Nasopharyngeal Swab     Status: None   Collection Time: 07/01/19  5:25 PM   Specimen: Nasopharyngeal Swab  Result Value Ref Range Status   SARS Coronavirus 2 NEGATIVE NEGATIVE Final    Comment: (NOTE) If result is NEGATIVE SARS-CoV-2 target nucleic acids are NOT DETECTED. The SARS-CoV-2 RNA is generally detectable in upper and lower  respiratory specimens during  the acute phase of infection. The lowest  concentration of SARS-CoV-2 viral copies this assay can detect is 250  copies / mL. A negative result does not preclude SARS-CoV-2 infection  and should not be used as the sole basis for treatment or other  patient management decisions.  A negative result may occur with  improper specimen collection / handling, submission of specimen other  than nasopharyngeal swab, presence of viral mutation(s)  within the  areas targeted by this assay, and inadequate number of viral copies  (<250 copies / mL). A negative result must be combined with clinical  observations, patient history, and epidemiological information. If result is POSITIVE SARS-CoV-2 target nucleic acids are DETECTED. The SARS-CoV-2 RNA is generally detectable in upper and lower  respiratory specimens dur ing the acute phase of infection.  Positive  results are indicative of active infection with SARS-CoV-2.  Clinical  correlation with patient history and other diagnostic information is  necessary to determine patient infection status.  Positive results do  not rule out bacterial infection or co-infection with other viruses. If result is PRESUMPTIVE POSTIVE SARS-CoV-2 nucleic acids MAY BE PRESENT.   A presumptive positive result was obtained on the submitted specimen  and confirmed on repeat testing.  While 2019 novel coronavirus  (SARS-CoV-2) nucleic acids may be present in the submitted sample  additional confirmatory testing may be necessary for epidemiological  and / or clinical management purposes  to differentiate between  SARS-CoV-2 and other Sarbecovirus currently known to infect humans.  If clinically indicated additional testing with an alternate test  methodology [phone]([id number]) is advised. The SARS-CoV-2 RNA is generally  detectable in upper and lower respiratory sp ecimens during the acute  phase of infection. The expected result is Negative. Fact Sheet for Patients:  BoilerBrush.com.cyhttps://www.fda.gov/media/136312/download Fact Sheet for Healthcare Providers: https://pope.com/https://www.fda.gov/media/136313/download This test is not yet approved or cleared by the [place] FDA and has been authorized for detection and/or diagnosis of SARS-CoV-2 by FDA under an Emergency Use Authorization (EUA).  This EUA will remain in effect (meaning this test can be used) for the duration of the COVID-19 declaration under Section 564(b)(1) of the Act,  21 U.S.C. section 360bbb-3(b)(1), unless the authorization is terminated or revoked sooner. Performed at [facility], [address] [place], [place] [address]     Studies/Results: Dg Chest Portable 1 View  Result Date: 07/01/2019 CLINICAL DATA:  Back and leg pain for 2 days. History of sickle cell disease. EXAM: PORTABLE CHEST 1 VIEW COMPARISON:  Chest x-ray 12/27/2018 FINDINGS: The heart is borderline enlarged but stable. The mediastinal and hilar contours are within normal limits and unchanged. The lungs demonstrate slight hyperinflation but no infiltrates or effusions. The bony thorax is intact. IMPRESSION: No acute cardiopulmonary findings.  Mild stable cardiac enlargement. Electronically Signed   By: Rudie MeyerP.  Gallerani M.D.   On: 07/01/2019 17:12    Medications: Scheduled Meds: . clonazePAM  0.5 mg Oral BID  . enoxaparin (LOVENOX) injection  40 mg Subcutaneous Q24H  . folic acid  1 mg Oral Daily  . HYDROmorphone   Intravenous Q4H  . methocarbamol  500 mg Oral QID  . senna-docusate  1 tablet Oral BID   Continuous Infusions: PRN Meds:.acetaminophen, naloxone **AND** sodium chloride flush, ondansetron (ZOFRAN) IV, oxyCODONE, polyethylene glycol  Consultants:  None  Procedures:  None  Antibiotics:  None  Assessment/Plan: Principal Problem:   Sickle cell pain crisis (HCC) Active Problems:   Pulmonary hypertension (HCC)   Anemia of chronic disease  Sickle cell anemia (HCC)  Sickle cell disease with pain crisis: Decrease IV fluids to 75 mL/h Continue weight-based Dilaudid PCA, no change in settings. IV Toradol 15 mg every 6 hours Reevaluate pain scale regularly 2 L supplemental oxygen as needed.  Tension headache: Continue acetaminophen as needed.  Leukocytosis: Suspected to be reactive.  No clinical indication for antibiotics.  Repeat CBC in a.m.  Sickle cell anemia:  Hemoglobin is consistent with patient's baseline.  No clinical indication for  blood transfusion at this time.  Continue to monitor closely.  Chronic pain syndrome: Continue home medications.  Generalized anxiety disorder  Klonopin 0.5 mg every 12 hours.  Code Status: Full Code Family Communication: N/A Disposition Plan: Not yet ready for discharge  Lachina Rennis PettyMoore Hollis  APRN, MSN, FNP-C [facility] [address]  [place], [place] [address] [phone]   If 5PM-7AM, please contact night-coverage.  07/03/2019, 1:06 PM  LOS: 2 days

## 2019-07-04 ENCOUNTER — Inpatient Hospital Stay (HOSPITAL_COMMUNITY): Payer: Medicare Other

## 2019-07-04 DIAGNOSIS — G4452 New daily persistent headache (NDPH): Secondary | ICD-10-CM

## 2019-07-04 LAB — CBC WITH DIFFERENTIAL/PLATELET
Abs Immature Granulocytes: 0.17 10*3/uL — ABNORMAL HIGH (ref 0.00–0.07)
Basophils Absolute: 0.1 10*3/uL (ref 0.0–0.1)
Basophils Relative: 0 %
Eosinophils Absolute: 0.3 10*3/uL (ref 0.0–0.5)
Eosinophils Relative: 2 %
HCT: 21.4 % — ABNORMAL LOW (ref 36.0–46.0)
Hemoglobin: 7.3 g/dL — ABNORMAL LOW (ref 12.0–15.0)
Immature Granulocytes: 1 %
Lymphocytes Relative: 39 %
Lymphs Abs: 5.9 10*3/uL — ABNORMAL HIGH (ref 0.7–4.0)
MCH: 34.1 pg — ABNORMAL HIGH (ref 26.0–34.0)
MCHC: 34.1 g/dL (ref 30.0–36.0)
MCV: 100 fL (ref 80.0–100.0)
Monocytes Absolute: 1.2 10*3/uL — ABNORMAL HIGH (ref 0.1–1.0)
Monocytes Relative: 8 %
Neutro Abs: 7.4 10*3/uL (ref 1.7–7.7)
Neutrophils Relative %: 50 %
Platelets: 260 10*3/uL (ref 150–400)
RBC: 2.14 MIL/uL — ABNORMAL LOW (ref 3.87–5.11)
RDW: 21 % — ABNORMAL HIGH (ref 11.5–15.5)
WBC: 15.1 10*3/uL — ABNORMAL HIGH (ref 4.0–10.5)
nRBC: 1.9 % — ABNORMAL HIGH (ref 0.0–0.2)

## 2019-07-04 LAB — TYPE AND SCREEN
ABO/RH(D): A POS
Antibody Screen: NEGATIVE

## 2019-07-04 MED ORDER — BUTALBITAL-APAP-CAFFEINE 50-325-40 MG PO TABS
1.0000 | ORAL_TABLET | Freq: Four times a day (QID) | ORAL | Status: DC | PRN
  Administered 2019-07-04: 1 via ORAL
  Filled 2019-07-04: qty 1

## 2019-07-04 NOTE — Progress Notes (Signed)
Subjective: Kara [name], a 33 year old female with a medical history significant for sickle cell disease, chronic pain syndrome, opiate dependence, and history of depression and anxiety was admitted for sickle cell pain crisis.  Patient states that "I feel terrible today".  Pain intensity 7-8/10.  Patient is not maximizing PCA Dilaudid.  However she is requesting oxycodone for pain control.  Patient continues to complain of headache, she states that it feels like a band is tightening around her head.  Headache is been unrelieved by Tylenol or Toradol.  Patient denies dizziness, blurred vision, paresthesias, abnormal gait, phonophobia, or photophobia. Patient afebrile and maintaining oxygen saturation at 98% on RA.  Objective:  Vital signs in last 24 hours:  Vitals:   07/04/19 0401 07/04/19 0539 07/04/19 1021 07/04/19 1356  BP:  (!) 112/54 (!) 99/57   Pulse:  88 80   Resp: 12 15 16 14   Temp:  99 F (37.2 C) 98.3 F (36.8 C)   TempSrc:  Oral Oral   SpO2: 98% 99% 98% 99%  Weight:      Height:        Intake/Output from previous day:   Intake/Output Summary (Last 24 hours) at 07/04/2019 1418 Last data filed at 07/04/2019 0600 Gross per 24 hour  Intake 360 ml  Output 1 ml  Net 359 ml    Physical Exam: General: Alert, awake, oriented x3, in no acute distress.  HEENT: [place]/AT PEERL, EOMI Neck: Trachea midline,  no masses, no thyromegal,y no JVD, no carotid bruit OROPHARYNX:  Moist, No exudate/ erythema/lesions.  Heart: Regular rate and rhythm, without murmurs, rubs, gallops, PMI non-displaced, no heaves or thrills on palpation.  Lungs: Clear to auscultation, no wheezing or rhonchi noted. No increased vocal fremitus resonant to percussion  Abdomen: Soft, nontender, nondistended, positive bowel sounds, no masses no hepatosplenomegaly noted..  Neuro: No focal neurological deficits noted cranial nerves II through XII grossly intact. DTRs 2+ bilaterally upper and lower extremities.  Strength 5 out of 5 in bilateral upper and lower extremities. Musculoskeletal: No warm swelling or erythema around joints, no spinal tenderness noted. Psychiatric: Patient alert and oriented x3, good insight and cognition, good recent to remote recall. Lymph node survey: No cervical axillary or inguinal lymphadenopathy noted.  Lab Results:  Basic Metabolic Panel:    Component Value Date/Time   NA 137 07/03/2019 0432   K 4.1 07/03/2019 0432   CL 102 07/03/2019 0432   CO2 27 07/03/2019 0432   BUN 6 07/03/2019 0432   CREATININE 0.35 (L) 07/03/2019 0432   GLUCOSE 93 07/03/2019 0432   CALCIUM 8.8 (L) 07/03/2019 0432   CBC:    Component Value Date/Time   WBC 15.1 (H) 07/04/2019 0429   HGB 7.3 (L) 07/04/2019 0429   HCT 21.4 (L) 07/04/2019 0429   PLT 260 07/04/2019 0429   MCV 100.0 07/04/2019 0429   NEUTROABS 7.4 07/04/2019 0429   LYMPHSABS 5.9 (H) 07/04/2019 0429   MONOABS 1.2 (H) 07/04/2019 0429   EOSABS 0.3 07/04/2019 0429   BASOSABS 0.1 07/04/2019 0429    Recent Results (from the past 240 hour(s))  SARS Coronavirus 2 [facility](Hospital order, Performed in [facility] hospital lab) Nasopharyngeal Nasopharyngeal Swab     Status: None   Collection Time: 07/01/19  5:25 PM   Specimen: Nasopharyngeal Swab  Result Value Ref Range Status   SARS Coronavirus 2 NEGATIVE NEGATIVE Final    Comment: (NOTE) If result is NEGATIVE SARS-CoV-2 target nucleic acids are NOT DETECTED. The SARS-CoV-2 RNA is generally  detectable in upper and lower  respiratory specimens during the acute phase of infection. The lowest  concentration of SARS-CoV-2 viral copies this assay can detect is 250  copies / mL. A negative result does not preclude SARS-CoV-2 infection  and should not be used as the sole basis for treatment or other  patient management decisions.  A negative result may occur with  improper specimen collection / handling, submission of specimen other  than nasopharyngeal swab, presence of viral  mutation(s) within the  areas targeted by this assay, and inadequate number of viral copies  (<250 copies / mL). A negative result must be combined with clinical  observations, patient history, and epidemiological information. If result is POSITIVE SARS-CoV-2 target nucleic acids are DETECTED. The SARS-CoV-2 RNA is generally detectable in upper and lower  respiratory specimens dur ing the acute phase of infection.  Positive  results are indicative of active infection with SARS-CoV-2.  Clinical  correlation with patient history and other diagnostic information is  necessary to determine patient infection status.  Positive results do  not rule out bacterial infection or co-infection with other viruses. If result is PRESUMPTIVE POSTIVE SARS-CoV-2 nucleic acids MAY BE PRESENT.   A presumptive positive result was obtained on the submitted specimen  and confirmed on repeat testing.  While 2019 novel coronavirus  (SARS-CoV-2) nucleic acids may be present in the submitted sample  additional confirmatory testing may be necessary for epidemiological  and / or clinical management purposes  to differentiate between  SARS-CoV-2 and other Sarbecovirus currently known to infect humans.  If clinically indicated additional testing with an alternate test  methodology [phone]([id number]) is advised. The SARS-CoV-2 RNA is generally  detectable in upper and lower respiratory sp ecimens during the acute  phase of infection. The expected result is Negative. Fact Sheet for Patients:  BoilerBrush.com.cyhttps://www.fda.gov/media/136312/download Fact Sheet for Healthcare Providers: https://pope.com/https://www.fda.gov/media/136313/download This test is not yet approved or cleared by the [place] FDA and has been authorized for detection and/or diagnosis of SARS-CoV-2 by FDA under an Emergency Use Authorization (EUA).  This EUA will remain in effect (meaning this test can be used) for the duration of the COVID-19 declaration under Section 564(b)(1)  of the Act, 21 U.S.C. section 360bbb-3(b)(1), unless the authorization is terminated or revoked sooner. Performed at [facility], [address] [place], [place] [address]     Studies/Results: No results found.  Medications: Scheduled Meds: . clonazePAM  0.5 mg Oral BID  . enoxaparin (LOVENOX) injection  40 mg Subcutaneous Q24H  . folic acid  1 mg Oral Daily  . HYDROmorphone   Intravenous Q4H  . methocarbamol  500 mg Oral QID  . senna-docusate  1 tablet Oral BID   Continuous Infusions: PRN Meds:.acetaminophen, naloxone **AND** sodium chloride flush, ondansetron (ZOFRAN) IV, oxyCODONE, polyethylene glycol  Assessment/Plan: Principal Problem:   Sickle cell pain crisis (HCC) Active Problems:   Pulmonary hypertension (HCC)   Anemia of chronic disease   Sickle cell anemia (HCC)  Sickle cell disease with pain crisis: [facility] Continue weight-based Dilaudid PCA, no changes in settings IV Toradol 15 mg every 6 hours Reevaluate pain scale regularly 2 L supplemental oxygen as needed.  Persistent headache: Fioricet every 6 hours as needed.  Patient continues to complain of severe headache despite treatment, CT of head, will review results as they become available.  Leukocytosis: WBCs improving.  Suspected to be reactive.  No signs of infection or inflammation.  No clinical indication for antibiotics at  this time Continue to follow CBC.   Sickle cell anemia:  Hemoglobin is consistent with patient's baseline.  No clinical indication for blood transfusion at this time.  Continue to monitor closely.  Chronic pain syndrome: Continue home medications.  Generalized anxiety disorder: Klonopin 0.5 mg every 12 hours.   Code Status: Full Code Family Communication: N/A Disposition Plan: Not yet ready for discharge  [place], MSN, FNP-C Patient [place] [address] [place], [place]  [address] [phone]  If 5PM-7AM, please contact night-coverage.  07/04/2019, 2:18 PM  LOS: 3 days

## 2019-07-04 NOTE — Plan of Care (Signed)

## 2019-07-04 NOTE — Progress Notes (Signed)
FNP notified that pt still c/o headache "when she moves". Rates it 7/10. Pt has been dozing off and on all day and has complaints when awaken. Orders obtained. Eulas Post, RN

## 2019-07-04 NOTE — Care Management Important Message (Signed)
Important Message  Patient Details IM Letter given to Cookie McGibboney RN to present to the Patient Name: Kara [name] MRN: [id number] Date of Birth: 1986-09-04   Medicare Important Message Given:  Yes     Kerin Salen 07/04/2019, 9:50 AM

## 2019-07-04 NOTE — Progress Notes (Signed)
Patient has complained majority of day of headache that is frontal and posterior. States it is "like the headache she gets with her crisis". PRNs given with no reported relief. FNP notified with orders obtained. Eulas Post, RN

## 2019-07-05 NOTE — Progress Notes (Signed)
Patient is c/o right foot/toes tingling and painful. Slight swelling noted in right foot/LE. Md made aware. No new orders at present. PRN given for pain. Eulas Post, RN

## 2019-07-05 NOTE — Progress Notes (Signed)
Subjective: A 33 year old female with known history of sickle cell disease who was admitted with sickle cell painful crisis.  Patient is complaining of pain and headache now at 7 out of 10.  Denied any fever or chills denied any nausea vomiting or diarrhea.  She has been taking her medications including Dilaudid PCA.  She has had some headaches and mild dizziness when she gets up.  Patient has had Lovenox shot and the area is indurated and painful.  She otherwise has no other significant complaint.  Objective: Vital signs in last 24 hours: Temp:  [98.1 F (36.7 C)-99 F (37.2 C)] 98.3 F (36.8 C) (08/15 0640) Pulse Rate:  [57-84] 73 (08/15 0640) Resp:  [12-18] 16 (08/15 0640) BP: (99-111)/(56-66) 110/61 (08/15 0640) SpO2:  [96 %-100 %] 100 % (08/15 0640) Weight change:  Last BM Date: (PTA)  Intake/Output from previous day: 08/14 0701 - 08/15 0700 In: 660 [P.O.:600; I.V.:60] Out: -  Intake/Output this shift: No intake/output data recorded.  General appearance: alert, cooperative, appears stated age and no distress Head: Normocephalic, without obvious abnormality, atraumatic Neck: no adenopathy, no carotid bruit, no JVD, supple, symmetrical, trachea midline and thyroid not enlarged, symmetric, no tenderness/mass/nodules Back: symmetric, no curvature. ROM normal. No CVA tenderness. Resp: clear to auscultation bilaterally Cardio: regular rate and rhythm, S1, S2 normal, no murmur, click, rub or gallop GI: soft, non-tender; bowel sounds normal; no masses,  no organomegaly Extremities: extremities normal, atraumatic, no cyanosis or edema Pulses: 2+ and symmetric Neurologic: Grossly normal  Lab Results: Recent Labs    07/04/19 0429  WBC 15.1*  HGB 7.3*  HCT 21.4*  PLT 260   BMET Recent Labs    07/03/19 0432  NA 137  K 4.1  CL 102  CO2 27  GLUCOSE 93  BUN 6  CREATININE 0.35*  CALCIUM 8.8*    Studies/Results: Ct Head Wo Contrast  Result Date: 07/04/2019 CLINICAL  DATA:  Headache. EXAM: CT HEAD WITHOUT CONTRAST TECHNIQUE: Contiguous axial images were obtained from the base of the skull through the vertex without intravenous contrast. COMPARISON:  None. FINDINGS: Brain: No evidence of acute infarction, hemorrhage, hydrocephalus, extra-axial collection or mass lesion/mass effect. Vascular: No hyperdense vessel or unexpected calcification. Skull: Normal. Negative for fracture or focal lesion. Sinuses/Orbits: No acute finding. Other: None. IMPRESSION: 1. Normal noncontrast head CT. Electronically Signed   By: Titus Dubin M.D.   On: 07/04/2019 18:59    Medications: I have reviewed the patient's current medications.  Assessment/Plan: This is a 33 year old female with sickle cell painful crisis.  #1 sickle cell painful crisis: Patient is improved tremendously.  Headache mainly.  Continue Dilaudid PCA with oral oxycodone.  #2 headache: Chronic daily headaches.  Currently on Fioricet.  Continue  #3 anemia of chronic disease: H&H appears to be at baseline.  Continue monitoring  #4 depression with anxiety: Continue home regimen.  She appears to be doing much better.  LOS: 4 days   GARBA,LAWAL 07/05/2019, 7:53 AM

## 2019-07-05 NOTE — Progress Notes (Signed)
I agree with the previous Nursing Assessment.

## 2019-07-06 LAB — CBC WITH DIFFERENTIAL/PLATELET
Abs Immature Granulocytes: 0.18 10*3/uL — ABNORMAL HIGH (ref 0.00–0.07)
Basophils Absolute: 0.1 10*3/uL (ref 0.0–0.1)
Basophils Relative: 1 %
Eosinophils Absolute: 0.4 10*3/uL (ref 0.0–0.5)
Eosinophils Relative: 3 %
HCT: 23.2 % — ABNORMAL LOW (ref 36.0–46.0)
Hemoglobin: 8 g/dL — ABNORMAL LOW (ref 12.0–15.0)
Immature Granulocytes: 1 %
Lymphocytes Relative: 35 %
Lymphs Abs: 4.6 10*3/uL — ABNORMAL HIGH (ref 0.7–4.0)
MCH: 33.6 pg (ref 26.0–34.0)
MCHC: 34.5 g/dL (ref 30.0–36.0)
MCV: 97.5 fL (ref 80.0–100.0)
Monocytes Absolute: 1.3 10*3/uL — ABNORMAL HIGH (ref 0.1–1.0)
Monocytes Relative: 10 %
Neutro Abs: 6.8 10*3/uL (ref 1.7–7.7)
Neutrophils Relative %: 50 %
Platelets: 300 10*3/uL (ref 150–400)
RBC: 2.38 MIL/uL — ABNORMAL LOW (ref 3.87–5.11)
RDW: 18.9 % — ABNORMAL HIGH (ref 11.5–15.5)
WBC: 13.3 10*3/uL — ABNORMAL HIGH (ref 4.0–10.5)
nRBC: 1 % — ABNORMAL HIGH (ref 0.0–0.2)

## 2019-07-06 LAB — COMPREHENSIVE METABOLIC PANEL
ALT: 20 U/L (ref 0–44)
AST: 37 U/L (ref 15–41)
Albumin: 3.8 g/dL (ref 3.5–5.0)
Alkaline Phosphatase: 61 U/L (ref 38–126)
Anion gap: 9 (ref 5–15)
BUN: <5 mg/dL — ABNORMAL LOW (ref 6–20)
CO2: 27 mmol/L (ref 22–32)
Calcium: 9.5 mg/dL (ref 8.9–10.3)
Chloride: 104 mmol/L (ref 98–111)
Creatinine, Ser: 0.38 mg/dL — ABNORMAL LOW (ref 0.44–1.00)
GFR calc Af Amer: >60 mL/min (ref >60)
GFR calc non Af Amer: >60 mL/min (ref >60)
Glucose, Bld: 99 mg/dL (ref 70–99)
Potassium: 4.3 mmol/L (ref 3.5–5.1)
Sodium: 140 mmol/L (ref 135–145)
Total Bilirubin: 2.5 mg/dL — ABNORMAL HIGH (ref 0.3–1.2)
Total Protein: 7.7 g/dL (ref 6.5–8.1)

## 2019-07-06 MED ORDER — OXYCODONE HCL 10 MG PO TABS: 10.0000 mg | ORAL_TABLET | ORAL | 0 refills | Status: DC | PRN

## 2019-07-06 MED ORDER — FOLIC ACID 1 MG PO TABS: 1.0000 mg | ORAL_TABLET | Freq: Every day | ORAL | 3 refills | Status: DC

## 2019-07-06 MED ORDER — BUTALBITAL-APAP-CAFFEINE 50-325-40 MG PO TABS: 1.0000 | ORAL_TABLET | Freq: Four times a day (QID) | ORAL | 0 refills | Status: DC | PRN

## 2019-07-06 MED ORDER — METHOCARBAMOL 500 MG PO TABS: 500.0000 mg | ORAL_TABLET | Freq: Four times a day (QID) | ORAL | 0 refills | Status: DC

## 2019-07-06 NOTE — Progress Notes (Signed)
Went to do patient's discharge paperwork and patient stated she did not have anymore of her Robaxin and folic acid at home. Paged MD. Awaiting new orders for meds and then patient will be discharged home.

## 2019-07-06 NOTE — Discharge Summary (Signed)
Physician Discharge Summary  Patient ID: Kara KirksSherry Wilz MRN: [id number] DOB/AGE: 23-Aug-1986 33 y.o.  Admit date: 07/01/2019 Discharge date: 07/06/2019  Admission Diagnoses:  Discharge Diagnoses:  Principal Problem:   Sickle cell pain crisis (HCC) Active Problems:   Pulmonary hypertension (HCC)   Anemia of chronic disease   Sickle cell anemia (HCC)   Headache, new daily persistent ([facility])   Discharged Condition: good  Hospital Course: Is a 33 year old female originally admitted with sickle cell painful crisis.  She also has pulmonary hypertension persistent headache and dizziness.  She was treated with IV Dilaudid PCA Toradol and IV fluids.  She is relatively opiate nave.  Has done much better gradually.  Pain, control was adequate.  At this point she is stable feeling better and ready for discharge home.  Patient will follow-up with referral to a new PCP.  She will also need to see hematologist as well as prescription for oxycodone given for short period of time.  Her pain is much better and back to baseline.  Her headaches are to be treated with Tylenol and occasional ibuprofen as needed.  Consults: None  Significant Diagnostic Studies: labs: Serial CBCs and CMP's were checked.  No transfusion  Treatments: IV hydration and analgesia: acetaminophen and Dilaudid  Discharge Exam: Blood pressure 111/67, pulse 87, temperature 98.5 F (36.9 C), temperature source Oral, resp. rate 16, height 5' (1.524 m), weight 52 kg, last menstrual period 06/28/2019, SpO2 100 %. General appearance: alert, cooperative, appears stated age and no distress Eyes: conjunctivae/corneas clear. PERRL, EOM's intact. Fundi benign. Neck: no adenopathy, no carotid bruit, no JVD, supple, symmetrical, trachea midline and thyroid not enlarged, symmetric, no tenderness/mass/nodules Back: symmetric, no curvature. ROM normal. No CVA tenderness. Resp: clear to auscultation bilaterally Cardio: regular rate and rhythm, S1,  S2 normal, no murmur, click, rub or gallop GI: soft, non-tender; bowel sounds normal; no masses,  no organomegaly Extremities: extremities normal, atraumatic, no cyanosis or edema Pulses: 2+ and symmetric Skin: Skin color, texture, turgor normal. No rashes or lesions Neurologic: Grossly normal  Disposition: Discharge disposition: 01-Home or Self Care       Discharge Instructions    Diet - low sodium heart healthy   Complete by: As directed    Increase activity slowly   Complete by: As directed      Allergies as of 07/06/2019      Reactions   Kiwi Extract Hives   Morphine And Related Hives   Paroxetine Hives      Medication List    TAKE these medications   butalbital-acetaminophen-caffeine 50-325-40 MG tablet Commonly known as: FIORICET Take 1 tablet by mouth every 6 (six) hours as needed for headache.   fluticasone 50 MCG/ACT nasal spray Commonly known as: Flonase Place 2 sprays into both nostrils daily.   folic acid 1 MG tablet Commonly known as: FOLVITE Take 1 tablet (1 mg total) by mouth daily.   ibuprofen 200 MG tablet Commonly known as: ADVIL Take 800 mg by mouth every 6 (six) hours as needed for moderate pain.   methocarbamol 500 MG tablet Commonly known as: ROBAXIN Take 500 mg by mouth 4 (four) times daily.   Oxycodone HCl 10 MG Tabs Take 1 tablet (10 mg total) by mouth every 4 (four) hours as needed for breakthrough pain.   Vitamin D (Ergocalciferol) 1.25 MG (50000 UT) Caps capsule Commonly known as: DRISDOL Take 50,000 Units by mouth every 7 (seven) days.        SignedLonia Blood: GARBA,LAWAL 07/06/2019, 12:59 PM  Time spent 37 minutes

## 2019-07-06 NOTE — Progress Notes (Signed)
Dr. Jonelle Sidle spoke with writer regarding patient getting set up with a PCP after discharge. RN called CM, Nancy Marus, who stated that the patient has Medicare and is able to get an appointment with any PCP and since its Sunday she will have to call on Monday to make an appointment.  Patient informed.

## 2019-07-14 ENCOUNTER — Ambulatory Visit (INDEPENDENT_AMBULATORY_CARE_PROVIDER_SITE_OTHER): Payer: Medicare Other | Admitting: Family Medicine

## 2019-07-14 ENCOUNTER — Encounter: Payer: Self-pay | Admitting: Family Medicine

## 2019-07-14 ENCOUNTER — Other Ambulatory Visit: Payer: Self-pay

## 2019-07-14 VITALS — BP 110/69 | HR 98 | Temp 98.6°F | Ht 60.0 in | Wt 115.4 lb

## 2019-07-14 DIAGNOSIS — F1190 Opioid use, unspecified, uncomplicated: Secondary | ICD-10-CM

## 2019-07-14 DIAGNOSIS — D571 Sickle-cell disease without crisis: Secondary | ICD-10-CM | POA: Diagnosis not present

## 2019-07-14 DIAGNOSIS — Z634 Disappearance and death of family member: Secondary | ICD-10-CM

## 2019-07-14 DIAGNOSIS — Z09 Encounter for follow-up examination after completed treatment for conditions other than malignant neoplasm: Secondary | ICD-10-CM

## 2019-07-14 DIAGNOSIS — F419 Anxiety disorder, unspecified: Secondary | ICD-10-CM | POA: Diagnosis not present

## 2019-07-14 DIAGNOSIS — K219 Gastro-esophageal reflux disease without esophagitis: Secondary | ICD-10-CM | POA: Diagnosis not present

## 2019-07-14 DIAGNOSIS — G8929 Other chronic pain: Secondary | ICD-10-CM

## 2019-07-14 DIAGNOSIS — M25561 Pain in right knee: Secondary | ICD-10-CM

## 2019-07-14 MED ORDER — CLONAZEPAM 1 MG PO TABS: 1.0000 mg | ORAL_TABLET | Freq: Every day | ORAL | 0 refills | Status: DC

## 2019-07-14 MED ORDER — NAPROXEN 500 MG PO TABS: 500.0000 mg | ORAL_TABLET | Freq: Two times a day (BID) | ORAL | 3 refills | Status: DC

## 2019-07-14 MED ORDER — OXYCODONE HCL 15 MG PO TABS: 15.0000 mg | ORAL_TABLET | Freq: Four times a day (QID) | ORAL | 0 refills | Status: DC | PRN

## 2019-07-14 MED ORDER — OMEPRAZOLE 20 MG PO CPDR: 20.0000 mg | DELAYED_RELEASE_CAPSULE | Freq: Every day | ORAL | 3 refills | Status: DC

## 2019-07-14 NOTE — Patient Instructions (Signed)
Chronic Knee Pain, Adult Knee pain that lasts longer than 3 months is called chronic knee pain. You may have pain in one or both knees. Symptoms of chronic knee pain may also include swelling and stiffness. The most common cause is age-related wear and tear (osteoarthritis) of your knee joint. Many conditions can cause chronic knee pain. Treatment depends on the cause. The main treatments are physical therapy and weight loss. It may also be treated with medicines, injections, a knee sleeve or brace, and by using crutches. Rest, ice, compression (pressure), and elevation (RICE) therapy may also be recommended. Follow these instructions at home: If you have a knee sleeve or brace:   Wear it as told by your doctor. Remove it only as told by your doctor.  Loosen it if your toes: ? Tingle. ? Become numb. ? Turn cold and blue.  Keep it clean.  If the sleeve or brace is not waterproof: ? Do not let it get wet. ? Remove it if told by your doctor, or cover it with a watertight covering when you take a bath or shower. Managing pain, stiffness, and swelling      If told, put heat on your knee. Do this as often as told by your doctor. Use the heat source that your doctor recommends, such as a moist heat pack or a heating pad. ? If you have a removable sleeve or brace, remove it as told by your doctor. ? Place a towel between your skin and the heat source. ? Leave the heat on for 20-30 minutes. ? Remove the heat if your skin turns bright red. This is very important if you are unable to feel pain, heat, or cold. You may have a greater risk of getting burned.  If told, put ice on your knee. ? If you have a removable sleeve or brace, remove it as told by your doctor. ? Put ice in a plastic bag. ? Place a towel between your skin and the bag. ? Leave the ice on for 20 minutes, 2-3 times a day.  Move your toes often.  Raise (elevate) the injured area above the level of your heart while you are  sitting or lying down. Activity  Avoid activities where both feet leave the ground at the same time (high-impact activities). Examples are running, jumping rope, and doing jumping jacks.  Return to your normal activities as told by your doctor. Ask your doctor what activities are safe for you.  Follow the exercise plan that your doctor makes for you. Your doctor may suggest that you: ? Avoid activities that make knee pain worse. You may need to change the exercises that you do, the sports that you participate in, or your job duties. ? Wear shoes with cushioned soles. ? Avoid high-impact activities or sports that require running and sudden changes in direction. ? Do exercises or physical therapy as told by your doctor. Physical therapy is planned to match your needs and abilities. ? Do exercises that increase your balance and strength, such as tai chi and yoga.  Do not use your injured knee to support your body weight until your doctor says that you can. Use crutches, a cane, or a walker, as told by your doctor. General instructions  Take over-the-counter and prescription medicines only as told by your doctor.  If you are overweight, work with your doctor and a food expert (dietitian) to set goals to lose weight. Being overweight can make your knee hurt more.  Do   not use any products that contain nicotine or tobacco, such as cigarettes, e-cigarettes, and chewing tobacco. If you need help quitting, ask your doctor.  Keep all follow-up visits as told by your doctor. This is important. Contact a doctor if:  You have knee pain that is not getting better or gets worse.  You are not able to do your exercises due to knee pain. Get help right away if:  Your knee swells and the swelling becomes worse.  You cannot move your knee.  You have very bad knee pain. Summary  Knee pain that lasts more than 3 months is considered chronic knee pain.  The main treatments for chronic knee pain are  physical therapy and weight loss. You may also need to take medicines, wear a knee sleeve or brace, use crutches, and put ice or heat on your knee.  Lose weight if you are overweight. Work with your doctor and a food expert (dietitian) to help you set goals to lose weight. Being overweight can make your knee hurt more.  Work with a physical therapist to make a safe exercise program, as told by your doctor. This information is not intended to replace advice given to you by your health care provider. Make sure you discuss any questions you have with your health care provider. Document Released: 01/16/2019 Document Revised: 01/16/2019 Document Reviewed: 01/16/2019 Elsevier Patient Education  [place]. Gastroesophageal Reflux Disease, Adult Gastroesophageal reflux (GER) happens when acid from the stomach flows up into the tube that connects the mouth and the stomach (esophagus). Normally, food travels down the esophagus and stays in the stomach to be digested. With GER, food and stomach acid sometimes move back up into the esophagus. You may have a disease called gastroesophageal reflux disease (GERD) if the reflux:  Happens often.  Causes frequent or very bad symptoms.  Causes problems such as damage to the esophagus. When this happens, the esophagus becomes sore and swollen (inflamed). Over time, GERD can make small holes (ulcers) in the lining of the esophagus. What are the causes? This condition is caused by a problem with the muscle between the esophagus and the stomach. When this muscle is weak or not normal, it does not close properly to keep food and acid from coming back up from the stomach. The muscle can be weak because of:  Tobacco use.  Pregnancy.  Having a certain type of hernia (hiatal hernia).  Alcohol use.  Certain foods and drinks, such as coffee, chocolate, onions, and peppermint. What increases the risk? You are more likely to develop this condition if you:   Are overweight.  Have a disease that affects your connective tissue.  Use NSAID medicines. What are the signs or symptoms? Symptoms of this condition include:  Heartburn.  Difficult or painful swallowing.  The feeling of having a lump in the throat.  A bitter taste in the mouth.  Bad breath.  Having a lot of saliva.  Having an upset or bloated stomach.  Belching.  Chest pain. Different conditions can cause chest pain. Make sure you see your doctor if you have chest pain.  Shortness of breath or noisy breathing (wheezing).  Ongoing (chronic) cough or a cough at night.  Wearing away of the surface of teeth (tooth enamel).  Weight loss. How is this treated? Treatment will depend on how bad your symptoms are. Your doctor may suggest:  Changes to your diet.  Medicine.  Surgery. Follow these instructions at home: Eating and drinking  Follow a diet as told by your doctor. You may need to avoid foods and drinks such as: ? Coffee and tea (with or without caffeine). ? Drinks that contain alcohol. ? Energy drinks and sports drinks. ? Bubbly (carbonated) drinks or sodas. ? Chocolate and cocoa. ? Peppermint and mint flavorings. ? Garlic and onions. ? Horseradish. ? Spicy and acidic foods. These include peppers, chili powder, curry powder, vinegar, hot sauces, and BBQ sauce. ? Citrus fruit juices and citrus fruits, such as oranges, lemons, and limes. ? Tomato-based foods. These include red sauce, chili, salsa, and pizza with red sauce. ? Fried and fatty foods. These include donuts, french fries, potato chips, and high-fat dressings. ? High-fat meats. These include hot dogs, rib eye steak, sausage, ham, and bacon. ? High-fat dairy items, such as whole milk, butter, and cream cheese.  Eat small meals often. Avoid eating large meals.  Avoid drinking large amounts of liquid with your meals.  Avoid eating meals during the 2-3 hours before bedtime.  Avoid lying down  right after you eat.  Do not exercise right after you eat. Lifestyle   Do not use any products that contain nicotine or tobacco. These include cigarettes, e-cigarettes, and chewing tobacco. If you need help quitting, ask your doctor.  Try to lower your stress. If you need help doing this, ask your doctor.  If you are overweight, lose an amount of weight that is healthy for you. Ask your doctor about a safe weight loss goal. General instructions  Pay attention to any changes in your symptoms.  Take over-the-counter and prescription medicines only as told by your doctor. Do not take aspirin, ibuprofen, or other NSAIDs unless your doctor says it is okay.  Wear loose clothes. Do not wear anything tight around your waist.  Raise (elevate) the head of your bed about 6 inches (15 cm).  Avoid bending over if this makes your symptoms worse.  Keep all follow-up visits as told by your doctor. This is important. Contact a doctor if:  You have new symptoms.  You lose weight and you do not know why.  You have trouble swallowing or it hurts to swallow.  You have wheezing or a cough that keeps happening.  Your symptoms do not get better with treatment.  You have a hoarse voice. Get help right away if:  You have pain in your arms, neck, jaw, teeth, or back.  You feel sweaty, dizzy, or light-headed.  You have chest pain or shortness of breath.  You throw up (vomit) and your throw-up looks like blood or coffee grounds.  You pass out (faint).  Your poop (stool) is bloody or black.  You cannot swallow, drink, or eat. Summary  If a person has gastroesophageal reflux disease (GERD), food and stomach acid move back up into the esophagus and cause symptoms or problems such as damage to the esophagus.  Treatment will depend on how bad your symptoms are.  Follow a diet as told by your doctor.  Take all medicines only as told by your doctor. This information is not intended to  replace advice given to you by your health care provider. Make sure you discuss any questions you have with your health care provider. Document Released: 04/24/2008 Document Revised: 05/15/2018 Document Reviewed: 05/15/2018 Elsevier Patient Education  [place]. Omeprazole capsules (sprinkle caps) - Rx What is this medicine? OMEPRAZOLE (oh ME pray zol) prevents the production of acid in the stomach. It is used to treat gastroesophageal reflux  disease (GERD), ulcers, certain bacteria in the stomach, inflammation of the esophagus, and Zollinger-Ellison Syndrome. It is also used to treat other conditions that cause too much stomach acid. This medicine may be used for other purposes; ask your health care provider or pharmacist if you have questions. COMMON BRAND NAME(S): Prilosec What should I tell my health care provider before I take this medicine? They need to know if you have any of these conditions:  liver disease  low levels of magnesium in the blood  lupus  an unusual or allergic reaction to omeprazole, other medicines, foods, dyes, or preservatives  pregnant or trying to get pregnant  breast-feeding How should I use this medicine? Take this medicine by mouth with a glass of water. Follow the directions on the prescription label. Do not cut, crush or chew this medicine. Swallow the capsules whole. You may open the capsule and put the contents in 1 tablespoon of applesauce. Swallow the medicine and applesauce right away. Do not chew the medicine or applesauce. Take this medicine before a meal. Take your medicine at regular intervals. Do not take your medicine more often than directed. Do not stop taking except on your doctor's advice. A special MedGuide will be given to you by the pharmacist with each prescription and refill. Be sure to read this information carefully each time. Talk to your pediatrician regarding the use of this medicine in children. While this drug may be  prescribed for children as young as 2 years for selected conditions, precautions do apply. Overdosage: If you think you have taken too much of this medicine contact a poison control center or emergency room at once. NOTE: This medicine is only for you. Do not share this medicine with others. What if I miss a dose? If you miss a dose, take it as soon as you can. If it is almost time for your next dose, take only that dose. Do not take double or extra doses. What may interact with this medicine? Do not take this medicine with any of the following medications:  atazanavir  clopidogrel  nelfinavir  rilpivirine This medicine may also interact with the following medications:  antifungals like itraconazole, ketoconazole, and voriconazole  certain antivirals for HIV or hepatitis  certain medicines that treat or prevent blood clots like warfarin  cilostazol  citalopram  cyclosporine  dasatinib  digoxin  disulfiram  diuretics  erlotinib  iron supplements  medicines for anxiety, panic, and sleep like diazepam  medicines for seizures like carbamazepine, phenobarbital, phenytoin  methotrexate  mycophenolate mofetil  nilotinib  rifampin  St. John's wort  tacrolimus  vitamin B12 This list may not describe all possible interactions. Give your health care provider a list of all the medicines, herbs, non-prescription drugs, or dietary supplements you use. Also tell them if you smoke, drink alcohol, or use illegal drugs. Some items may interact with your medicine. What should I watch for while using this medicine? Visit your healthcare professional for regular checks on your progress. Tell your healthcare professional if your symptoms do not start to get better or if they get worse. You may need blood work done while taking this medicine. This medicine may cause a decrease in vitamin B12. You should make sure that you get enough vitamin B12 while you are taking this  medicine. Discuss the foods you eat and the vitamins you take with your health care professional. What side effects may I notice from receiving this medicine? Side effects that you should report to  your doctor or health care professional as soon as possible:  allergic reactions like skin rash, itching or hives, swelling of the face, lips, or tongue  bone pain  breathing problems  fever or sore throat  joint pain  rash on cheeks or arms that gets worse in the sun  redness, blistering, peeling, or loosening of the skin, including inside the mouth  severe diarrhea  signs and symptoms of kidney injury like trouble passing urine or change in the amount of urine  signs and symptoms of low magnesium like muscle cramps; muscle pain; muscle weakness; tremors; seizures; or fast, irregular heartbeat  stomach polyps  unusual bleeding or bruising Side effects that usually do not require medical attention (report to your doctor or health care professional if they continue or are bothersome):  diarrhea  dry mouth  gas  headache  nausea  stomach pain This list may not describe all possible side effects. Call your doctor for medical advice about side effects. You may report side effects to FDA at 1-800-FDA-1088. Where should I keep my medicine? Keep out of the reach of children. Store at room temperature between 15 and 30 degrees C (59 and 86 degrees F). Protect from light and moisture. Throw away any unused medicine after the expiration date. NOTE: This sheet is a summary. It may not cover all possible information. If you have questions about this medicine, talk to your doctor, pharmacist, or health care provider.  2020 Elsevier/Gold Standard (2018-08-28 13:30:14) Naproxen Sodium oral tablet, extended-release What is this medicine? NAPROXEN (na PROX en) is a non-steroidal anti-inflammatory drug (NSAID). It is used to reduce swelling and to treat pain. This medicine may be used for  dental pain, headache, or painful monthly periods. It is also used for painful joint and muscular problems such as arthritis, tendinitis, bursitis, and gout. This medicine may be used for other purposes; ask your health care provider or pharmacist if you have questions. COMMON BRAND NAME(S): Midol Extended Relief, Naprelan, Naprelan Dose Card What should I tell my health care provider before I take this medicine? They need to know if you have any of these conditions:  cigarette smoker  coronary artery bypass graft (CABG) surgery within the past 2 weeks  drink more than 3 alcohol-containing drinks a day  heart disease  high blood pressure  history of stomach bleeding  kidney disease  liver disease  lung or breathing disease, like asthma  an unusual or allergic reaction to naproxen, aspirin, other NSAIDs, other medicines, foods, dyes, or preservatives  pregnant or trying to get pregnant  breast-feeding How should I use this medicine? Take this medicine by mouth with a glass of water. Follow the directions on the prescription label. Take this medicine with food if it upsets your stomach. Try to not lie down for at least 10 minutes after you take it. Take your medicine at regular intervals. Do not take your medicine more often than directed. Long-term, continuous use may increase the risk of heart attack or stroke. A special MedGuide will be given to you by the pharmacist with each prescription and refill. Be sure to read this information carefully each time. Talk to your pediatrician regarding the use of this medicine in children. Special care may be needed. Overdosage: If you think you have taken too much of this medicine contact a poison control center or emergency room at once. NOTE: This medicine is only for you. Do not share this medicine with others. What if I miss a  dose? If you miss a dose, take it as soon as you can. If it is almost time for your next dose, take only that  dose. Do not take double or extra doses. What may interact with this medicine?  alcohol  aspirin  cidofovir  diuretics  lithium  methotrexate  other drugs for inflammation like ketorolac or prednisone  pemetrexed  probenecid  warfarin This list may not describe all possible interactions. Give your health care provider a list of all the medicines, herbs, non-prescription drugs, or dietary supplements you use. Also tell them if you smoke, drink alcohol, or use illegal drugs. Some items may interact with your medicine. What should I watch for while using this medicine? Tell your doctor or healthcare provider if your pain does not get better. Talk to your doctor before taking another medicine for pain. Do not treat yourself. This medicine does not prevent heart attack or stroke. In fact, this medicine may increase the chance of a heart attack or stroke. The chance may increase with longer use of this medicine and in people who have heart disease. If you take aspirin to prevent heart attack or stroke, talk with your doctor or healthcare provider. This medicine may cause serious skin reactions. They can happen weeks to months after starting the medicine. Contact your healthcare provider right away if you notice fevers or flu-like symptoms with a rash. The rash may be red or purple and then turn into blisters or peeling of the skin. Or, you might notice a red rash with swelling of the face, lips or lymph nodes in your neck or under your arms. Do not take other medicines that contain aspirin, ibuprofen, or naproxen with this medicine. Side effects such as stomach upset, nausea, or ulcers may be more likely to occur. Many medicines available without a prescription should not be taken with this medicine. This medicine can cause ulcers and bleeding in the stomach and intestines at any time during treatment. Do not smoke cigarettes or drink alcohol. These increase irritation to your stomach and can  make it more susceptible to damage from this medicine. Ulcers and bleeding can happen without warning symptoms and can cause death. You may get drowsy or dizzy. Do not drive, use machinery, or do anything that needs mental alertness until you know how this medicine affects you. Do not stand or sit up quickly, especially if you are an older patient. This reduces the risk of dizzy or fainting spells. This medicine can cause you to bleed more easily. Try to avoid damage to your teeth and gums when you brush or floss your teeth. What side effects may I notice from receiving this medicine? Side effects that you should report to your doctor or health care professional as soon as possible:  black or bloody stools, blood in the urine or vomit  blurred vision  chest pain  difficulty breathing or wheezing  nausea or vomiting  redness, blistering, peeling, or loosening of the skin, including inside the mouth  severe stomach pain  skin rash, hives, or itching  slurred speech or weakness on one side of the body  swelling of eyelids, throat, lips  unexplained weight gain or swelling  unusually weak or tired  yellowing of eyes or skin Side effects that usually do not require medical attention (report to your doctor or health care professional if they continue or are bothersome):  constipation  headache  heartburn This list may not describe all possible side effects. Call your doctor  for medical advice about side effects. You may report side effects to FDA at 1-800-FDA-1088. Where should I keep my medicine? Keep out of the reach of children. Store at room temperature between 20 and 25 degrees C (68 and 77 degrees F). Keep container tightly closed. Throw away any unused medicine after the expiration date. NOTE: This sheet is a summary. It may not cover all possible information. If you have questions about this medicine, talk to your doctor, pharmacist, or health care provider.  2020  Elsevier/Gold Standard (2019-01-28 07:54:00)

## 2019-07-14 NOTE — Progress Notes (Signed)
[facility]   New Patient--Hospital Follow Up--Establish Care  Subjective:  Patient ID: Kara [name], female    DOB: 10/01/1986  Age: 33 y.o. MRN: [id number]  CC:  Chief Complaint  Patient presents with   New Patient (Initial Visit)    est care, hospital follow up    HPI Kara [name] is a 33 year old who presents for Hospital Follow Up and to Establish Care today.   Past Medical History:  Diagnosis Date   Arthritis    Chronic migraine    GERD (gastroesophageal reflux disease)    Hx of cholecystectomy 2015   Pulmonary hypertension (HCC)    Sickle cell anemia (HCC)    Tendinitis    left elbow   Current Status: This will be Ms. Terry's initial office visit with me.  She was currently seeing a physician in [place] for her PCP needs. She has not had a Hematologist and is searching for one. She currently resides in [place], [place]. She has been in [place] X 2 years now. Since her last office visit, she has has multiple ED visits. Today, she is doing well. She states that she has pain in her knees, back, and joints. She rates her pain today at 6/10. She has not had a hospital visit for Sickle Cell Crisis since 07/01/2019 where She treated and discharged on 07/06/2019. She is currently taking all medications as prescribed and staying well hydrated. She reports occasional nausea, constipation, dizziness and headaches. She has chronic migraines. She was regularly receiving cortisone injections when she was living in [place]. Each injection lasted X 6 month. She denies fevers, chills, fatigue, recent infections, weight loss, and night sweats. She has not had any visual changes, and falls. No chest pain, heart palpitations, cough and shortness of breath reported. No reports of GI problems such as vomiting, and diarrhea. She has no reports of blood in stools, dysuria and hematuria. Her anxiety is moderate today, r/t [place]  and her increasing pain. She denies suicidal ideations, homicidal ideations, or auditory hallucinations.   Past Surgical History:  Procedure Laterality Date   DILATION AND CURETTAGE OF UTERUS     GALLBLADDER SURGERY      Family History  Adopted: Yes    Social History   Socioeconomic History   Marital status: Single    Spouse name: Not on file   Number of children: Not on file   Years of education: Not on file   Highest education level: Not on file  Occupational History   Not on file  Social Needs   Financial resource strain: Not very hard   Food insecurity    Worry: Never true    Inability: Never true   Transportation needs    Medical: Yes    Non-medical: Yes  Tobacco Use   Smoking status: Current Every Day Smoker   Smokeless tobacco: Never Used  Substance and Sexual Activity   Alcohol use: No    Frequency: Never   Drug use: Not on file   Sexual activity: Yes    Birth control/protection: None  Lifestyle   Physical activity    Days per week: Not on file    Minutes per session: Not on file   Stress: Not on file  Relationships   Social connections    Talks on phone: Not on file    Gets together: Not on file    Attends religious service: Not on file  Active member of club or organization: Not on file    Attends meetings of clubs or organizations: Not on file    Relationship status: Not on file   Intimate partner violence    Fear of current or ex partner: Not on file    Emotionally abused: Not on file    Physically abused: Not on file    Forced sexual activity: Not on file  Other Topics Concern   Not on file  Social History Narrative   Not on file    Outpatient Medications Prior to Visit  Medication Sig Dispense Refill   butalbital-acetaminophen-caffeine (FIORICET) 50-325-40 MG tablet Take 1 tablet by mouth every 6 (six) hours as needed for headache. 14 tablet 0   fluticasone (FLONASE) 50 MCG/ACT nasal spray Place 2 sprays into  both nostrils daily. 16 g 0   folic acid (FOLVITE) 1 MG tablet Take 1 tablet (1 mg total) by mouth daily. 30 tablet 3   ibuprofen (ADVIL) 200 MG tablet Take 800 mg by mouth every 6 (six) hours as needed for moderate pain.     methocarbamol (ROBAXIN) 500 MG tablet Take 1 tablet (500 mg total) by mouth 4 (four) times daily. 120 tablet 0   Vitamin D, Ergocalciferol, (DRISDOL) 1.25 MG (50000 UT) CAPS capsule Take 50,000 Units by mouth every 7 (seven) days.     naproxen (NAPROSYN) 500 MG tablet Take 500 mg by mouth 2 (two) times daily with a meal.     oxyCODONE 10 MG TABS Take 1 tablet (10 mg total) by mouth every 4 (four) hours as needed for breakthrough pain. 30 tablet 0   No facility-administered medications prior to visit.     Allergies  Allergen Reactions   Kiwi Extract Hives   Morphine And Related Hives   Paroxetine Hives    ROS Review of Systems  Constitutional: Negative.   HENT: Negative.   Eyes: Negative.   Respiratory: Negative.   Cardiovascular: Negative.   Gastrointestinal: Positive for constipation (occasional ) and nausea (occasional ).  Endocrine: Negative.   Genitourinary: Negative.   Musculoskeletal: Negative.   Skin: Negative.   Allergic/Immunologic: Negative.   Neurological: Positive for dizziness (occassional ) and headaches (occasional).  Hematological: Negative.   Psychiatric/Behavioral: The patient is nervous/anxious.    Objective:    Physical Exam  Constitutional: She is oriented to person, place, and time. She appears well-developed and well-nourished.  HENT:  Head: Normocephalic and atraumatic.  Eyes: Conjunctivae are normal.  Neck: Normal range of motion. Neck supple.  Cardiovascular: Normal rate, normal heart sounds and intact distal pulses.  Pulmonary/Chest: Effort normal and breath sounds normal.  Abdominal: Soft. Bowel sounds are normal.  Musculoskeletal: Normal range of motion.  Neurological: She is alert and oriented to person,  place, and time. She has normal reflexes.  Skin: Skin is warm and dry.  Psychiatric: She has a normal mood and affect. Her behavior is normal. Judgment and thought content normal.  Nursing note and vitals reviewed.   BP 110/69 (BP Location: Left Arm, Patient Position: Sitting, Cuff Size: Normal)    Pulse 98    Temp 98.6 F (37 C) (Oral)    Ht 5' (1.524 m)    Wt 115 lb 6.4 oz (52.3 kg)    LMP 06/28/2019    BMI 22.54 kg/m  Wt Readings from Last 3 Encounters:  07/14/19 115 lb 6.4 oz (52.3 kg)  07/02/19 114 lb 10.2 oz (52 kg)  07/01/19 115 lb (52.2 kg)  Health Maintenance Due  Topic Date Due   TETANUS/TDAP  08/31/2005   PAP SMEAR-Modifier  09/01/2007   INFLUENZA VACCINE  06/21/2019    There are no preventive care reminders to display for this patient.  No results found for: TSH Lab Results  Component Value Date   WBC 14.2 (H) 07/14/2019   HGB 9.1 (L) 07/14/2019   HCT 27.8 (L) 07/14/2019   MCV 103 (H) 07/14/2019   PLT 435 07/14/2019   Lab Results  Component Value Date   NA 138 07/14/2019   K 4.3 07/14/2019   CO2 18 (L) 07/14/2019   GLUCOSE 81 07/14/2019   BUN 7 07/14/2019   CREATININE 0.58 07/14/2019   BILITOT 3.3 (H) 07/14/2019   ALKPHOS 62 07/14/2019   AST 24 07/14/2019   ALT 13 07/14/2019   PROT 8.1 07/14/2019   ALBUMIN 5.0 (H) 07/14/2019   CALCIUM 9.8 07/14/2019   ANIONGAP 9 07/06/2019   No results found for: CHOL No results found for: HDL No results found for: LDLCALC No results found for: TRIG No results found for: CHOLHDL No results found for: RUEA5W    Assessment & Plan:   1. Hb-SS disease without crisis [facility])  She does report increasing pain lately. we wil increase her Oxycodone to 15 mg every 6 hours. She will continue to take pain medications as prescribed; will continue to avoid extreme heat and cold; will continue to eat a healthy diet and drink at least 64 ounces of water daily; continue stool softener as needed; will avoid colds and flu;  will continue to get plenty of sleep and rest; will continue to avoid high stressful situations and remain infection free; will continue Folic Acid 1 mg daily to avoid sickle cell crisis.  - Sickle Cell Panel - Hemoglobinopathy evaluation - Ferritin - Drug Screen 12+Alcohol+CRT, Ur - Ambulatory referral to Hematology - naproxen (NAPROSYN) 500 MG tablet; Take 1 tablet (500 mg total) by mouth 2 (two) times daily with a meal.  Dispense: 60 tablet; Refill: 3 - oxyCODONE (ROXICODONE) 15 MG immediate release tablet; Take 1 tablet (15 mg total) by mouth every 6 (six) hours as needed for pain.  Dispense: 60 tablet; Refill: 0  2. Chronic pain of right knee - naproxen (NAPROSYN) 500 MG tablet; Take 1 tablet (500 mg total) by mouth 2 (two) times daily with a meal.  Dispense: 60 tablet; Refill: 3  3. Gastroesophageal reflux disease without esophagitis - omeprazole (PRILOSEC) 20 MG capsule; Take 1 capsule (20 mg total) by mouth daily.  Dispense: 30 capsule; Refill: 3  4. Anxiety We will initiate a 1 time dose of Clonazepam today to aide in patient's increased anxiety.  - clonazePAM (KLONOPIN) 1 MG tablet; Take 1 tablet (1 mg total) by mouth daily.  Dispense: 30 tablet; Refill: 0  5. Death of family member  6. Other chronic pain - oxyCODONE (ROXICODONE) 15 MG immediate release tablet; Take 1 tablet (15 mg total) by mouth every 6 (six) hours as needed for pain.  Dispense: 60 tablet; Refill: 0  7. Chronic, continuous use of opioids - oxyCODONE (ROXICODONE) 15 MG immediate release tablet; Take 1 tablet (15 mg total) by mouth every 6 (six) hours as needed for pain.  Dispense: 60 tablet; Refill: 0  8. Follow up She will follow up in 1 month.   Meds ordered this encounter  Medications   naproxen (NAPROSYN) 500 MG tablet    Sig: Take 1 tablet (500 mg total) by mouth 2 (two) times daily  with a meal.    Dispense:  60 tablet    Refill:  3   omeprazole (PRILOSEC) 20 MG capsule    Sig: Take 1 capsule  (20 mg total) by mouth daily.    Dispense:  30 capsule    Refill:  3   DISCONTD: clonazePAM (KLONOPIN) 1 MG tablet    Sig: Take 1 tablet (1 mg total) by mouth daily.    Dispense:  30 tablet    Refill:  0    Order Specific Question:   Supervising Provider    Answer:   Quentin AngstJEGEDE, OLUGBEMIGA E [[id number]]   oxyCODONE (ROXICODONE) 15 MG immediate release tablet    Sig: Take 1 tablet (15 mg total) by mouth every 6 (six) hours as needed for pain.    Dispense:  60 tablet    Refill:  0    Dose increase! Discontinue Oxycodone 10 mg.    Order Specific Question:   Supervising Provider    Answer:   Quentin AngstJEGEDE, OLUGBEMIGA E [[id number]]   clonazePAM (KLONOPIN) 1 MG tablet    Sig: Take 1 tablet (1 mg total) by mouth daily.    Dispense:  30 tablet    Refill:  0    Orders Placed This Encounter  Procedures   Sickle Cell Panel   Hemoglobinopathy evaluation   Ferritin   Drug Screen 12+Alcohol+CRT, Ur   Drug Screen 12+Alcohol+CRT, Ur   Drug Screen 8 w/Conf, Ur   Ambulatory referral to Hematology     Referral Orders     Ambulatory referral to Hematology   Raliegh IpNatalie Bron Snellings,  MSN, FNP-BC [facility] [address]  [place], [place] [id number] [phone]    Problem List Items Addressed This Visit      Other   Sickle cell anemia (HCC) - Primary   Relevant Medications   naproxen (NAPROSYN) 500 MG tablet   oxyCODONE (ROXICODONE) 15 MG immediate release tablet   Other Relevant Orders   Sickle Cell Panel (Completed)   Hemoglobinopathy evaluation (Completed)   Ferritin   Drug Screen 12+Alcohol+CRT, Ur   Ambulatory referral to Hematology    Other Visit Diagnoses    Chronic pain of right knee       Relevant Medications   naproxen (NAPROSYN) 500 MG tablet   oxyCODONE (ROXICODONE) 15 MG immediate release tablet   clonazePAM (KLONOPIN) 1 MG tablet   Gastroesophageal reflux disease without esophagitis         Relevant Medications   omeprazole (PRILOSEC) 20 MG capsule   Anxiety       Relevant Medications   clonazePAM (KLONOPIN) 1 MG tablet   Death of family member       Other chronic pain       Relevant Medications   naproxen (NAPROSYN) 500 MG tablet   oxyCODONE (ROXICODONE) 15 MG immediate release tablet   clonazePAM (KLONOPIN) 1 MG tablet   Chronic, continuous use of opioids       Relevant Medications   oxyCODONE (ROXICODONE) 15 MG immediate release tablet   Other Relevant Orders   Drug Screen 12+Alcohol+CRT, Ur   Drug Screen 8 w/Conf, Ur   Follow up          Meds ordered this encounter  Medications   naproxen (NAPROSYN) 500 MG tablet    Sig: Take 1 tablet (500 mg total) by mouth 2 (two) times daily with a meal.    Dispense:  60 tablet  Refill:  3   omeprazole (PRILOSEC) 20 MG capsule    Sig: Take 1 capsule (20 mg total) by mouth daily.    Dispense:  30 capsule    Refill:  3   DISCONTD: clonazePAM (KLONOPIN) 1 MG tablet    Sig: Take 1 tablet (1 mg total) by mouth daily.    Dispense:  30 tablet    Refill:  0    Order Specific Question:   Supervising Provider    Answer:   Quentin AngstJEGEDE, OLUGBEMIGA E [[id number]]   oxyCODONE (ROXICODONE) 15 MG immediate release tablet    Sig: Take 1 tablet (15 mg total) by mouth every 6 (six) hours as needed for pain.    Dispense:  60 tablet    Refill:  0    Dose increase! Discontinue Oxycodone 10 mg.    Order Specific Question:   Supervising Provider    Answer:   Quentin AngstJEGEDE, OLUGBEMIGA E [[id number]]   clonazePAM (KLONOPIN) 1 MG tablet    Sig: Take 1 tablet (1 mg total) by mouth daily.    Dispense:  30 tablet    Refill:  0    Follow-up: Return in about 1 month (around 08/14/2019).    Kallie LocksNatalie M Christon Parada, FNP

## 2019-07-15 DIAGNOSIS — K219 Gastro-esophageal reflux disease without esophagitis: Secondary | ICD-10-CM | POA: Insufficient documentation

## 2019-07-15 DIAGNOSIS — F1190 Opioid use, unspecified, uncomplicated: Secondary | ICD-10-CM | POA: Insufficient documentation

## 2019-07-15 DIAGNOSIS — F419 Anxiety disorder, unspecified: Secondary | ICD-10-CM | POA: Insufficient documentation

## 2019-07-15 DIAGNOSIS — Z634 Disappearance and death of family member: Secondary | ICD-10-CM | POA: Insufficient documentation

## 2019-07-15 DIAGNOSIS — G8929 Other chronic pain: Secondary | ICD-10-CM | POA: Insufficient documentation

## 2019-07-17 LAB — HEMOGLOBINOPATHY EVALUATION
HGB C: 0 %
HGB S: 66.9 % — ABNORMAL HIGH
HGB VARIANT: 0 %
Hemoglobin A2 Quantitation: 3.6 % — ABNORMAL HIGH (ref 1.8–3.2)
Hemoglobin F Quantitation: 10.2 % — ABNORMAL HIGH (ref 0.0–2.0)
Hgb A: 19.3 % — ABNORMAL LOW (ref 96.4–98.8)

## 2019-07-17 LAB — CMP14+CBC/D/PLT+FER+RETIC+V...
ALT: 13 IU/L (ref 0–32)
AST: 24 IU/L (ref 0–40)
Albumin/Globulin Ratio: 1.6 (ref 1.2–2.2)
Albumin: 5 g/dL — ABNORMAL HIGH (ref 3.8–4.8)
Alkaline Phosphatase: 62 IU/L (ref 39–117)
BUN/Creatinine Ratio: 12 (ref 9–23)
BUN: 7 mg/dL (ref 6–20)
Basophils Absolute: 0.1 10*3/uL (ref 0.0–0.2)
Basos: 1 %
Bilirubin Total: 3.3 mg/dL — ABNORMAL HIGH (ref 0.0–1.2)
CO2: 18 mmol/L — ABNORMAL LOW (ref 20–29)
Calcium: 9.8 mg/dL (ref 8.7–10.2)
Chloride: 104 mmol/L (ref 96–106)
Creatinine, Ser: 0.58 mg/dL (ref 0.57–1.00)
EOS (ABSOLUTE): 0.3 10*3/uL (ref 0.0–0.4)
Eos: 2 %
Ferritin: 447 ng/mL — ABNORMAL HIGH (ref 15–150)
GFR calc Af Amer: 141 mL/min/{1.73_m2} (ref >59)
GFR calc non Af Amer: 122 mL/min/{1.73_m2} (ref >59)
Globulin, Total: 3.1 g/dL (ref 1.5–4.5)
Glucose: 81 mg/dL (ref 65–99)
Hematocrit: 27.8 % — ABNORMAL LOW (ref 34.0–46.6)
Hemoglobin: 9.1 g/dL — ABNORMAL LOW (ref 11.1–15.9)
Immature Grans (Abs): 0.1 10*3/uL (ref 0.0–0.1)
Immature Granulocytes: 1 %
Lymphocytes Absolute: 7.3 10*3/uL — ABNORMAL HIGH (ref 0.7–3.1)
Lymphs: 51 %
MCH: 33.7 pg — ABNORMAL HIGH (ref 26.6–33.0)
MCHC: 32.7 g/dL (ref 31.5–35.7)
MCV: 103 fL — ABNORMAL HIGH (ref 79–97)
Monocytes Absolute: 1.4 10*3/uL — ABNORMAL HIGH (ref 0.1–0.9)
Monocytes: 10 %
NRBC: 1 % — ABNORMAL HIGH (ref 0–0)
Neutrophils Absolute: 5 10*3/uL (ref 1.4–7.0)
Neutrophils: 35 %
Platelets: 435 10*3/uL (ref 150–450)
Potassium: 4.3 mmol/L (ref 3.5–5.2)
RBC: 2.7 x10E6/uL — CL (ref 3.77–5.28)
RDW: 19 % — ABNORMAL HIGH (ref 11.7–15.4)
Retic Ct Pct: 13.6 % — ABNORMAL HIGH (ref 0.6–2.6)
Sodium: 138 mmol/L (ref 134–144)
Total Protein: 8.1 g/dL (ref 6.0–8.5)
Vit D, 25-Hydroxy: 10.9 ng/mL — ABNORMAL LOW (ref 30.0–100.0)
WBC: 14.2 10*3/uL — ABNORMAL HIGH (ref 3.4–10.8)

## 2019-07-21 ENCOUNTER — Other Ambulatory Visit: Payer: Self-pay

## 2019-07-21 MED ORDER — VITAMIN D (ERGOCALCIFEROL) 1.25 MG (50000 UNIT) PO CAPS: 50000.0000 [IU] | ORAL_CAPSULE | ORAL | 1 refills | Status: DC

## 2019-08-08 ENCOUNTER — Telehealth: Payer: Self-pay | Admitting: Family Medicine

## 2019-08-11 ENCOUNTER — Non-Acute Institutional Stay (HOSPITAL_COMMUNITY)
Admission: AD | Admit: 2019-08-11 | Discharge: 2019-08-11 | Disposition: A | Discharge status: Home / Self Care | Payer: Medicare Other | Source: Ambulatory Visit | Attending: Internal Medicine | Admitting: Internal Medicine

## 2019-08-11 ENCOUNTER — Telehealth (HOSPITAL_COMMUNITY): Payer: Self-pay | Admitting: *Deleted

## 2019-08-11 ENCOUNTER — Other Ambulatory Visit: Payer: Self-pay | Admitting: Family Medicine

## 2019-08-11 ENCOUNTER — Telehealth: Payer: Self-pay | Admitting: Family Medicine

## 2019-08-11 DIAGNOSIS — F1190 Opioid use, unspecified, uncomplicated: Secondary | ICD-10-CM

## 2019-08-11 DIAGNOSIS — F17210 Nicotine dependence, cigarettes, uncomplicated: Secondary | ICD-10-CM | POA: Insufficient documentation

## 2019-08-11 DIAGNOSIS — F419 Anxiety disorder, unspecified: Secondary | ICD-10-CM | POA: Insufficient documentation

## 2019-08-11 DIAGNOSIS — Z7951 Long term (current) use of inhaled steroids: Secondary | ICD-10-CM | POA: Diagnosis not present

## 2019-08-11 DIAGNOSIS — D5700 Hb-SS disease with crisis, unspecified: Secondary | ICD-10-CM

## 2019-08-11 DIAGNOSIS — K219 Gastro-esophageal reflux disease without esophagitis: Secondary | ICD-10-CM | POA: Insufficient documentation

## 2019-08-11 DIAGNOSIS — G8929 Other chronic pain: Secondary | ICD-10-CM

## 2019-08-11 DIAGNOSIS — Z791 Long term (current) use of non-steroidal anti-inflammatories (NSAID): Secondary | ICD-10-CM | POA: Insufficient documentation

## 2019-08-11 DIAGNOSIS — D571 Sickle-cell disease without crisis: Secondary | ICD-10-CM

## 2019-08-11 DIAGNOSIS — G894 Chronic pain syndrome: Secondary | ICD-10-CM | POA: Insufficient documentation

## 2019-08-11 DIAGNOSIS — F329 Major depressive disorder, single episode, unspecified: Secondary | ICD-10-CM | POA: Insufficient documentation

## 2019-08-11 DIAGNOSIS — M1990 Unspecified osteoarthritis, unspecified site: Secondary | ICD-10-CM | POA: Insufficient documentation

## 2019-08-11 DIAGNOSIS — Z79899 Other long term (current) drug therapy: Secondary | ICD-10-CM | POA: Diagnosis not present

## 2019-08-11 DIAGNOSIS — I272 Pulmonary hypertension, unspecified: Secondary | ICD-10-CM | POA: Diagnosis not present

## 2019-08-11 LAB — CBC WITH DIFFERENTIAL/PLATELET
Abs Immature Granulocytes: 0.11 10*3/uL — ABNORMAL HIGH (ref 0.00–0.07)
Basophils Absolute: 0.1 10*3/uL (ref 0.0–0.1)
Basophils Relative: 1 %
Eosinophils Absolute: 0.4 10*3/uL (ref 0.0–0.5)
Eosinophils Relative: 3 %
HCT: 24 % — ABNORMAL LOW (ref 36.0–46.0)
Hemoglobin: 8.3 g/dL — ABNORMAL LOW (ref 12.0–15.0)
Immature Granulocytes: 1 %
Lymphocytes Relative: 53 %
Lymphs Abs: 6.2 10*3/uL — ABNORMAL HIGH (ref 0.7–4.0)
MCH: 35.3 pg — ABNORMAL HIGH (ref 26.0–34.0)
MCHC: 34.6 g/dL (ref 30.0–36.0)
MCV: 102.1 fL — ABNORMAL HIGH (ref 80.0–100.0)
Monocytes Absolute: 1.1 10*3/uL — ABNORMAL HIGH (ref 0.1–1.0)
Monocytes Relative: 10 %
Neutro Abs: 3.7 10*3/uL (ref 1.7–7.7)
Neutrophils Relative %: 32 %
Platelets: 338 10*3/uL (ref 150–400)
RBC: 2.35 MIL/uL — ABNORMAL LOW (ref 3.87–5.11)
RDW: 21 % — ABNORMAL HIGH (ref 11.5–15.5)
WBC: 11.6 10*3/uL — ABNORMAL HIGH (ref 4.0–10.5)
nRBC: 1.6 % — ABNORMAL HIGH (ref 0.0–0.2)

## 2019-08-11 LAB — COMPREHENSIVE METABOLIC PANEL
ALT: 19 U/L (ref 0–44)
AST: 36 U/L (ref 15–41)
Albumin: 4.4 g/dL (ref 3.5–5.0)
Alkaline Phosphatase: 44 U/L (ref 38–126)
Anion gap: 7 (ref 5–15)
BUN: 5 mg/dL — ABNORMAL LOW (ref 6–20)
CO2: 22 mmol/L (ref 22–32)
Calcium: 9 mg/dL (ref 8.9–10.3)
Chloride: 108 mmol/L (ref 98–111)
Creatinine, Ser: 0.44 mg/dL (ref 0.44–1.00)
GFR calc Af Amer: >60 mL/min (ref >60)
GFR calc non Af Amer: >60 mL/min (ref >60)
Glucose, Bld: 90 mg/dL (ref 70–99)
Potassium: 3.8 mmol/L (ref 3.5–5.1)
Sodium: 137 mmol/L (ref 135–145)
Total Bilirubin: 3 mg/dL — ABNORMAL HIGH (ref 0.3–1.2)
Total Protein: 8 g/dL (ref 6.5–8.1)

## 2019-08-11 LAB — RETICULOCYTES
Immature Retic Fract: 40 % — ABNORMAL HIGH (ref 2.3–15.9)
RBC.: 2.35 MIL/uL — ABNORMAL LOW (ref 3.87–5.11)
Retic Count, Absolute: 448.5 10*3/uL — ABNORMAL HIGH (ref 19.0–186.0)
Retic Ct Pct: 17.9 % — ABNORMAL HIGH (ref 0.4–3.1)

## 2019-08-11 LAB — PREGNANCY, URINE: Preg Test, Ur: NEGATIVE

## 2019-08-11 MED ORDER — SODIUM CHLORIDE 0.45 % IV SOLN
INTRAVENOUS | Status: DC
  Administered 2019-08-11: 12:00:00 via INTRAVENOUS

## 2019-08-11 MED ORDER — SODIUM CHLORIDE 0.9 % IV SOLN
25.0000 mg | INTRAVENOUS | Status: DC | PRN
  Filled 2019-08-11: qty 0.5

## 2019-08-11 MED ORDER — KETOROLAC TROMETHAMINE 30 MG/ML IJ SOLN
15.0000 mg | Freq: Once | INTRAMUSCULAR | Status: AC
  Administered 2019-08-11: 15 mg via INTRAVENOUS
  Filled 2019-08-11: qty 1

## 2019-08-11 MED ORDER — ONDANSETRON HCL 4 MG/2ML IJ SOLN: 4.0000 mg | Freq: Four times a day (QID) | INTRAMUSCULAR | Status: DC | PRN

## 2019-08-11 MED ORDER — HYDROMORPHONE 1 MG/ML IV SOLN
INTRAVENOUS | Status: DC
  Administered 2019-08-11: 30 mg via INTRAVENOUS
  Administered 2019-08-11: 5 mg via INTRAVENOUS
  Filled 2019-08-11: qty 30

## 2019-08-11 MED ORDER — DIPHENHYDRAMINE HCL 25 MG PO CAPS
25.0000 mg | ORAL_CAPSULE | ORAL | Status: DC | PRN
  Administered 2019-08-11: 13:00:00 25 mg via ORAL
  Filled 2019-08-11: qty 1

## 2019-08-11 MED ORDER — ACETAMINOPHEN 500 MG PO TABS
1000.0000 mg | ORAL_TABLET | Freq: Once | ORAL | Status: AC
  Administered 2019-08-11: 12:00:00 1000 mg via ORAL
  Filled 2019-08-11: qty 2

## 2019-08-11 MED ORDER — NALOXONE HCL 0.4 MG/ML IJ SOLN: 0.4000 mg | INTRAMUSCULAR | Status: DC | PRN

## 2019-08-11 MED ORDER — OXYCODONE HCL 15 MG PO TABS: 15.0000 mg | ORAL_TABLET | Freq: Four times a day (QID) | ORAL | 0 refills | Status: DC | PRN

## 2019-08-11 MED ORDER — SODIUM CHLORIDE 0.9% FLUSH: 9.0000 mL | INTRAVENOUS | Status: DC | PRN

## 2019-08-11 NOTE — Discharge Summary (Signed)
[facility] Discharge Summary   Patient ID: Kara [name] MRN: [id number] DOB/AGE: 1986/04/30 33 y.o.  Admit date: 08/11/2019 Discharge date: 08/11/2019  Primary Care Physician:  [name], FNP  Admission Diagnoses:  Active Problems:   Sickle cell pain crisis [facility])    Discharge Medications:  Allergies as of 08/11/2019      Reactions   Kiwi Extract Hives   Morphine And Related Hives   Paroxetine Hives      Medication List    TAKE these medications   butalbital-acetaminophen-caffeine 50-325-40 MG tablet Commonly known as: FIORICET Take 1 tablet by mouth every 6 (six) hours as needed for headache.   clonazePAM 1 MG tablet Commonly known as: KLONOPIN Take 1 tablet (1 mg total) by mouth daily.   fluticasone 50 MCG/ACT nasal spray Commonly known as: Flonase Place 2 sprays into both nostrils daily.   folic acid 1 MG tablet Commonly known as: FOLVITE Take 1 tablet (1 mg total) by mouth daily.   ibuprofen 200 MG tablet Commonly known as: ADVIL Take 800 mg by mouth every 6 (six) hours as needed for moderate pain.   methocarbamol 500 MG tablet Commonly known as: ROBAXIN Take 1 tablet (500 mg total) by mouth 4 (four) times daily.   naproxen 500 MG tablet Commonly known as: NAPROSYN Take 1 tablet (500 mg total) by mouth 2 (two) times daily with a meal.   omeprazole 20 MG capsule Commonly known as: PRILOSEC Take 1 capsule (20 mg total) by mouth daily.   oxyCODONE 15 MG immediate release tablet Commonly known as: ROXICODONE Take 1 tablet (15 mg total) by mouth every 6 (six) hours as needed for pain.   Vitamin D (Ergocalciferol) 1.25 MG (50000 UT) Caps capsule Commonly known as: DRISDOL Take 1 capsule (50,000 Units total) by mouth every 7 (seven) days.        Consults:  None  Significant Diagnostic Studies:  No results found.  History of present illness:  Kara [name], 33 year old female with a medical history significant for  sickle cell disease, chronic pain syndrome, history of pulmonary hypertension, history of anxiety and depression presents complaining of generalized pain that is consistent with her typical pain crisis.  Patient states that her pain is been worsening over the past 5 days.  Pain has been unrelieved by oxycodone 15 mg every 6 hours.  She last had medication around 3 AM without sustained relief.  She says the pain is primarily to left neck, left shoulder, and right lower extremity.  Current pain intensity is 6-7/10 characterized as constant and throbbing. Patient denies fever, chills, recent travel, sick contacts, or exposure to COVID-19.  Patient also denies headache, dizziness, paresthesias, chest pain, shortness of breath, dysuria, nausea, vomiting, or diarrhea.  [facility] Course: Patient admitted to sickle cell day infusion center for management of pain crisis. All laboratory values reviewed.  Patient's hemoglobin is 8.3, consistent with her baseline.  No clinical indication for blood transfusion during current admission.  WBCs 11.6, no signs of infection or inflammation on today. Patient afebrile and maintaining oxygen saturation above 100%.  Pain managed with IV Dilaudid via PCA with settings of 0.5 mg, 10-minute lockout, and 3 mg/h. IV Toradol 15 mg x 1 dose Tylenol 1000 mg by mouth x1 dose IV fluids, 0.45% saline at 100 mL/h Pain intensity decreased to 5-6/10, consistent with patient's baseline.  There is no indication for inpatient admission on today. Patient advised to resume home medications. Also, follow-up with  PCP as scheduled. She is alert, oriented, and ambulating without assistance. Patient will discharge home in a hemodynamically stable condition.  Discharge instructions: Resume all home medications.  Follow up with PCP as previously  scheduled.   Discussed the importance of drinking 64 ounces of water daily to  help prevent pain crises, it is important to drink  plenty of water throughout the day. This is because dehydration of red blood cells may lead further sickling.   Avoid all stressors that precipitate sickle cell pain crisis.     The patient was given clear instructions to go to ER or return to medical center if symptoms do not improve, worsen or new problems develop.    Physical Exam at Discharge:  BP (!) 107/48 (BP Location: Right Arm)   Pulse 88   Temp 99.1 F (37.3 C) (Oral)   Resp 14   SpO2 96%   Physical Exam HENT:     Head: Normocephalic.  Eyes:     Pupils: Pupils are equal, round, and reactive to light.  Cardiovascular:     Rate and Rhythm: Normal rate and regular rhythm.  Pulmonary:     Effort: Pulmonary effort is normal.  Abdominal:     General: Abdomen is flat. Bowel sounds are normal.  Musculoskeletal: Normal range of motion.  Skin:    General: Skin is warm.  Neurological:     Mental Status: She is alert.  Psychiatric:        Mood and Affect: Mood normal.        Behavior: Behavior normal.        Thought Content: Thought content normal.        Judgment: Judgment normal.      Disposition at Discharge: Discharge disposition: 01-Home or Self Care       Discharge Orders: Discharge Instructions    Discharge patient   Complete by: As directed    Discharge disposition: 01-Home or Self Care   Discharge patient date: 08/11/2019      Condition at Discharge:   Stable  Time spent on Discharge:  Greater than 30 minutes.  Signed:  Donia Pounds  APRN, MSN, FNP-C [facility] [address] [place], [place] [address] [phone]  08/11/2019, 3:36 PM

## 2019-08-11 NOTE — Telephone Encounter (Signed)
Patient called requesting to come to the day hospital for sickle cell pain. Initially, patient reported generalized pain rated 7/10. Reports being out of Oxycodone prescription for one week. COVID-19 screening done and negative. Patient denies fever, chest pain, nausea, vomiting, diarrhea and abdominal pain. Admits to having transportation without driving self. [place], [place] notified. Patient can come to the day hospital for pain management. Patient also encouraged to call pain medications in so she can pick up prescription and manage pain at home when discharged from day hospital. Patient advised and expresses an understanding.

## 2019-08-11 NOTE — H&P (Signed)
[facility] History and Physical   Date: 08/11/2019  Patient name: Kara [name] Medical record number: [id number] Date of birth: 1986/05/01 Age: 33 y.o. Gender: female PCP: [name], FNP  Attending physician: Quentin Angst, MD  Chief Complaint: Sickle cell pain  History of Present Illness: Kara [name], a 33 year old female with a medical history significant for sickle cell disease, chronic pain syndrome, history of pulmonary hypertension, history of anxiety and depression presents complaining of generalized pain that is consistent with her typical pain crisis.  Patient states that her pain has been above baseline over the past 5 days.  Pain is been unrelieved by oxycodone 15 mg every 6 hours.  She last had medication around 3 AM without sustained relief.  She says that pain is primarily to left neck, left shoulder, and right lower extremity.  Current pain intensity is 6-7/10 characterized as constant and throbbing. Patient denies fever, chills, recent travel, sick contacts, or exposure to COVID-19. Patient also denies headache, dizziness, paresthesias, chest pain, shortness of breath, dysuria, nausea, vomiting, or diarrhea.  Meds: Medications Prior to Admission  Medication Sig Dispense Refill Last Dose  . butalbital-acetaminophen-caffeine (FIORICET) 50-325-40 MG tablet Take 1 tablet by mouth every 6 (six) hours as needed for headache. 14 tablet 0   . clonazePAM (KLONOPIN) 1 MG tablet Take 1 tablet (1 mg total) by mouth daily. 30 tablet 0   . fluticasone (FLONASE) 50 MCG/ACT nasal spray Place 2 sprays into both nostrils daily. 16 g 0   . folic acid (FOLVITE) 1 MG tablet Take 1 tablet (1 mg total) by mouth daily. 30 tablet 3   . ibuprofen (ADVIL) 200 MG tablet Take 800 mg by mouth every 6 (six) hours as needed for moderate pain.     . methocarbamol (ROBAXIN) 500 MG tablet Take 1 tablet (500 mg total) by mouth 4 (four) times daily. 120 tablet 0   . naproxen  (NAPROSYN) 500 MG tablet Take 1 tablet (500 mg total) by mouth 2 (two) times daily with a meal. 60 tablet 3   . omeprazole (PRILOSEC) 20 MG capsule Take 1 capsule (20 mg total) by mouth daily. 30 capsule 3   . oxyCODONE (ROXICODONE) 15 MG immediate release tablet Take 1 tablet (15 mg total) by mouth every 6 (six) hours as needed for pain. 60 tablet 0   . Vitamin D, Ergocalciferol, (DRISDOL) 1.25 MG (50000 UT) CAPS capsule Take 1 capsule (50,000 Units total) by mouth every 7 (seven) days. 5 capsule 1     Allergies: Kiwi extract, Morphine and related, and Paroxetine Past Medical History:  Diagnosis Date  . Arthritis   . Chronic migraine   . GERD (gastroesophageal reflux disease)   . Hx of cholecystectomy 2015  . Pulmonary hypertension (HCC)   . Sickle cell anemia (HCC)   . Tendinitis    left elbow   Past Surgical History:  Procedure Laterality Date  . DILATION AND CURETTAGE OF UTERUS    . GALLBLADDER SURGERY     Family History  Adopted: Yes   Social History   Socioeconomic History  . Marital status: Single    Spouse name: Not on file  . Number of children: Not on file  . Years of education: Not on file  . Highest education level: Not on file  Occupational History  . Not on file  Social Needs  . Financial resource strain: Not very hard  . Food insecurity    Worry: Never true  Inability: Never true  . Transportation needs    Medical: Yes    Non-medical: Yes  Tobacco Use  . Smoking status: Current Every Day Smoker  . Smokeless tobacco: Never Used  Substance and Sexual Activity  . Alcohol use: No    Frequency: Never  . Drug use: Not on file  . Sexual activity: Yes    Birth control/protection: None  Lifestyle  . Physical activity    Days per week: Not on file    Minutes per session: Not on file  . Stress: Not on file  Relationships  . Social Herbalist on phone: Not on file    Gets together: Not on file    Attends religious service: Not on file     Active member of club or organization: Not on file    Attends meetings of clubs or organizations: Not on file    Relationship status: Not on file  . Intimate partner violence    Fear of current or ex partner: Not on file    Emotionally abused: Not on file    Physically abused: Not on file    Forced sexual activity: Not on file  Other Topics Concern  . Not on file  Social History Narrative  . Not on file   Review of Systems  Constitutional: Negative for chills and fever.  HENT: Negative for sore throat.   Eyes: Negative for blurred vision.  Respiratory: Negative for shortness of breath.   Cardiovascular: Negative for chest pain and palpitations.  Gastrointestinal: Negative.   Genitourinary: Negative.   Musculoskeletal: Positive for back pain, joint pain and neck pain (Left neck, left upper shoulder).  Neurological: Negative.   Endo/Heme/Allergies: Negative.   Psychiatric/Behavioral: Negative.      Physical Exam: There were no vitals taken for this visit. There were no vitals taken for this visit.  General Appearance:    Alert, cooperative, no distress, appears stated age  Head:    Normocephalic, without obvious abnormality, atraumatic  Eyes:    PERRL, conjunctiva/corneas clear, EOM's intact, fundi    benign, both eyes  Ears:    Normal TM's and external ear canals, both ears  Nose:   Nares normal, septum midline, mucosa normal, no drainage    or sinus tenderness  Throat:   Lips, mucosa, and tongue normal; teeth and gums normal  Neck:   Supple, symmetrical, trachea midline, no adenopathy;    thyroid:  no enlargement/tenderness/nodules; no carotid   bruit or JVD  Back:     Symmetric, no curvature, ROM normal, no CVA tenderness  Lungs:     Clear to auscultation bilaterally, respirations unlabored  Chest Wall:    No tenderness or deformity   Heart:    Regular rate and rhythm, S1 and S2 normal, no murmur, rub   or gallop  Abdomen:     Soft, non-tender, bowel sounds active all  four quadrants,    no masses, no organomegaly  Extremities:   Extremities normal, atraumatic, no cyanosis or edema  Pulses:   2+ and symmetric all extremities  Skin:   Skin color, texture, turgor normal, no rashes or lesions  Lymph nodes:   Cervical, supraclavicular, and axillary nodes normal  Neurologic:   CNII-XII intact, normal strength, sensation and reflexes    throughout     Lab results: No results found for this or any previous visit (from the past 24 hour(s)).  Imaging results:  No results found.   Assessment & Plan: Patient  admitted to sickle cell day infusion center for management of pain crisis.  Patient is opiate tolerant. IV fluids, 0.45% saline at 100 mL/h Toradol 15 mg IV x1 dose Tylenol 1000 mg by mouth x1 Opiate tolerant, weightbased, high concentration PCA Dilaudid.  Settings of 0.5 mg, 10-minute lockout, and 3 mg/h. Review CBC with differential, CMP, reticulocyte, and urine pregnancy as results become available. Patient will be reevaluated for pain intensity in context of functioning and relationship to baseline as her care progresses. If pain remains above baseline, will transition to inpatient services for higher level of care.  Nolon NationsLachina Moore Cassiel Fernandez  APRN, MSN, FNP-C Patient Care [facility] [address]  [place], [place] [id number] [phone]  08/11/2019, 11:44 AM

## 2019-08-11 NOTE — Telephone Encounter (Signed)
Pt made a appointment to follow up

## 2019-08-11 NOTE — Telephone Encounter (Signed)
Pt is requesting a refill please advise  ?

## 2019-08-11 NOTE — Progress Notes (Signed)
Patient admitted to the day infusion hospital for sickle cell pain crisis. Initially, patient reported generalized pain rated 7/10. For pain management, patient placed on Dilaudid PCA, given 15 mg Toradol, 1000 mg Tylenol and hydrated with IV fluids. At discharge, patient rated pain at 5/10. Vital signs stable. Discharge instructions given. Patient alert, oriented and ambulatory at discharge.

## 2019-08-11 NOTE — Discharge Instructions (Signed)
Resume all home medications.  Follow up with PCP as previously  scheduled.   Avoid all stressors that precipitate sickle cell pain crisis.     The patient was given clear instructions to go to ER or return to medical center if symptoms do not improve, worsen or new problems develop.    Sickle Cell Anemia, Adult  Sickle cell anemia is a condition where your red blood cells are shaped like sickles. Red blood cells carry oxygen through the body. Sickle-shaped cells do not live as long as normal red blood cells. They also clump together and block blood from flowing through the blood vessels. This prevents the body from getting enough oxygen. Sickle cell anemia causes organ damage and pain. It also increases the risk of infection. Follow these instructions at home: Medicines  Take over-the-counter and prescription medicines only as told by your doctor.  If you were prescribed an antibiotic medicine, take it as told by your doctor. Do not stop taking the antibiotic even if you start to feel better.  If you develop a fever, do not take medicines to lower the fever right away. Tell your doctor about the fever. Managing pain, stiffness, and swelling  Try these methods to help with pain: ? Use a heating pad. ? Take a warm bath. ? Distract yourself, such as by watching TV. Eating and drinking  Drink enough fluid to keep your pee (urine) clear or pale yellow. Drink more in hot weather and during exercise.  Limit or avoid alcohol.  Eat a healthy diet. Eat plenty of fruits, vegetables, whole grains, and lean protein.  Take vitamins and supplements as told by your doctor. Traveling  When traveling, keep these with you: ? Your medical information. ? The names of your doctors. ? Your medicines.  If you need to take an airplane, talk to your doctor first. Activity  Rest often.  Avoid exercises that make your heart beat much faster, such as jogging. General instructions  Do not use  products that have nicotine or tobacco, such as cigarettes and e-cigarettes. If you need help quitting, ask your doctor.  Consider wearing a medical alert bracelet.  Avoid being in high places (high altitudes), such as mountains.  Avoid very hot or cold temperatures.  Avoid places where the temperature changes a lot.  Keep all follow-up visits as told by your doctor. This is important. Contact a doctor if:  A joint hurts.  Your feet or hands hurt or swell.  You feel tired (fatigued). Get help right away if:  You have symptoms of infection. These include: ? Fever. ? Chills. ? Being very tired. ? Irritability. ? Poor eating. ? Throwing up (vomiting).  You feel dizzy or faint.  You have new stomach pain, especially on the left side.  You have a an erection (priapism) that lasts more than 4 hours.  You have numbness in your arms or legs.  You have a hard time moving your arms or legs.  You have trouble talking.  You have pain that does not go away when you take medicine.  You are short of breath.  You are breathing fast.  You have a long-term cough.  You have pain in your chest.  You have a bad headache.  You have a stiff neck.  Your stomach looks bloated even though you did not eat much.  Your skin is pale.  You suddenly cannot see well. Summary  Sickle cell anemia is a condition where your red blood cells  are shaped like sickles.  Follow your doctor's advice on ways to manage pain, food to eat, activities to do, and steps to take for safe travel.  Get medical help right away if you have any signs of infection, such as a fever. This information is not intended to replace advice given to you by your health care provider. Make sure you discuss any questions you have with your health care provider. Document Released: 08/27/2013 Document Revised: 02/28/2019 Document Reviewed: 12/12/2016 Elsevier Patient Education  2020 [employer].

## 2019-08-15 ENCOUNTER — Ambulatory Visit: Payer: Medicare Other | Admitting: Family Medicine

## 2019-08-26 ENCOUNTER — Other Ambulatory Visit: Payer: Self-pay

## 2019-08-26 ENCOUNTER — Encounter: Payer: Self-pay | Admitting: Family Medicine

## 2019-08-26 ENCOUNTER — Ambulatory Visit (INDEPENDENT_AMBULATORY_CARE_PROVIDER_SITE_OTHER): Payer: Medicare Other | Admitting: Family Medicine

## 2019-08-26 VITALS — BP 108/63 | HR 97 | Temp 98.3°F | Ht 60.0 in | Wt 121.6 lb

## 2019-08-26 DIAGNOSIS — Z09 Encounter for follow-up examination after completed treatment for conditions other than malignant neoplasm: Secondary | ICD-10-CM

## 2019-08-26 DIAGNOSIS — F1190 Opioid use, unspecified, uncomplicated: Secondary | ICD-10-CM

## 2019-08-26 DIAGNOSIS — G8929 Other chronic pain: Secondary | ICD-10-CM | POA: Diagnosis not present

## 2019-08-26 DIAGNOSIS — D571 Sickle-cell disease without crisis: Secondary | ICD-10-CM

## 2019-08-26 DIAGNOSIS — K219 Gastro-esophageal reflux disease without esophagitis: Secondary | ICD-10-CM

## 2019-08-26 DIAGNOSIS — R630 Anorexia: Secondary | ICD-10-CM | POA: Diagnosis not present

## 2019-08-26 DIAGNOSIS — D5700 Hb-SS disease with crisis, unspecified: Secondary | ICD-10-CM

## 2019-08-26 MED ORDER — OXYCODONE HCL 15 MG PO TABS: 15.0000 mg | ORAL_TABLET | Freq: Four times a day (QID) | ORAL | 0 refills | Status: DC | PRN

## 2019-08-26 MED ORDER — OMEPRAZOLE 40 MG PO CPDR: 40.0000 mg | DELAYED_RELEASE_CAPSULE | Freq: Every day | ORAL | 3 refills | Status: DC

## 2019-08-26 MED ORDER — ENSURE PLUS PO LIQD: 237.0000 mL | Freq: Three times a day (TID) | ORAL | 6 refills | Status: DC

## 2019-08-26 NOTE — Progress Notes (Signed)
Patient [facility]  Established Patient Office Visit  Subjective:  Patient ID: Kara [name], female    DOB: Sep 08, 1986  Age: 33 y.o. MRN: [id number]  CC:  Chief Complaint  Patient presents with  . Follow-up    face and hand swelling    HPI Kara [name] is a 33 year old female who presents for Follow Up today.   Past Medical History:  Diagnosis Date  . Arthritis   . Chronic migraine   . GERD (gastroesophageal reflux disease)   . Hx of cholecystectomy 2015  . Pulmonary hypertension ([facility])   . Sickle cell anemia (HCC)   . Tendinitis    left elbow   Current Status: Since her last office visit, she is doing well with no complaints. She states that she has pain in her hands, knees, legs and back. She rates her pain today at 6/10. She does not report any chest pain or new pain today. She has not had a hospital visit for Sickle Cell Crisis since 08/11/2019 where she was treated and discharged the same day. She is currently taking all medications as prescribed and staying well hydrated. She reports occasional nausea, constipation, dizziness and headaches. She has not followed up with referral for Hematology in [facility] She denies fevers, chills, recent infections, weight loss, and night sweats. She has not had any visual changes, and falls. No chest pain, heart palpitations, cough and shortness of breath reported. No reports of GI problems such as vomiting, and diarrhea. She has no reports of blood in stools, dysuria and hematuria. No depression or anxiety reported today.   Past Surgical History:  Procedure Laterality Date  . DILATION AND CURETTAGE OF UTERUS    . GALLBLADDER SURGERY      Family History  Adopted: Yes    Social History   Socioeconomic History  . Marital status: Single    Spouse name: Not on file  . Number of children: Not on file  . Years of education: Not on file  . Highest education level: Not on file  Occupational  History  . Not on file  Social Needs  . Financial resource strain: Not very hard  . Food insecurity    Worry: Never true    Inability: Never true  . Transportation needs    Medical: Yes    Non-medical: Yes  Tobacco Use  . Smoking status: Current Every Day Smoker  . Smokeless tobacco: Never Used  Substance and Sexual Activity  . Alcohol use: No    Frequency: Never  . Drug use: Not on file  . Sexual activity: Yes    Birth control/protection: None  Lifestyle  . Physical activity    Days per week: Not on file    Minutes per session: Not on file  . Stress: Not on file  Relationships  . Social Herbalist on phone: Not on file    Gets together: Not on file    Attends religious service: Not on file    Active member of club or organization: Not on file    Attends meetings of clubs or organizations: Not on file    Relationship status: Not on file  . Intimate partner violence    Fear of current or ex partner: Not on file    Emotionally abused: Not on file    Physically abused: Not on file    Forced sexual activity: Not on file  Other Topics Concern  .  Not on file  Social History Narrative  . Not on file    Outpatient Medications Prior to Visit  Medication Sig Dispense Refill  . butalbital-acetaminophen-caffeine (FIORICET) 50-325-40 MG tablet Take 1 tablet by mouth every 6 (six) hours as needed for headache. 14 tablet 0  . fluticasone (FLONASE) 50 MCG/ACT nasal spray Place 2 sprays into both nostrils daily. 16 g 0  . folic acid (FOLVITE) 1 MG tablet Take 1 tablet (1 mg total) by mouth daily. 30 tablet 3  . ibuprofen (ADVIL) 200 MG tablet Take 800 mg by mouth every 6 (six) hours as needed for moderate pain.    . methocarbamol (ROBAXIN) 500 MG tablet Take 1 tablet (500 mg total) by mouth 4 (four) times daily. 120 tablet 0  . naproxen (NAPROSYN) 500 MG tablet Take 1 tablet (500 mg total) by mouth 2 (two) times daily with a meal. 60 tablet 3  . omeprazole (PRILOSEC) 20 MG  capsule Take 1 capsule (20 mg total) by mouth daily. 30 capsule 3  . oxyCODONE (ROXICODONE) 15 MG immediate release tablet Take 1 tablet (15 mg total) by mouth every 6 (six) hours as needed for pain. 60 tablet 0  . clonazePAM (KLONOPIN) 1 MG tablet Take 1 tablet (1 mg total) by mouth daily. 30 tablet 0  . Vitamin D, Ergocalciferol, (DRISDOL) 1.25 MG (50000 UT) CAPS capsule Take 1 capsule (50,000 Units total) by mouth every 7 (seven) days. 5 capsule 1   No facility-administered medications prior to visit.     Allergies  Allergen Reactions  . Kiwi Extract Hives  . Morphine And Related Hives  . Paroxetine Hives   ROS Review of Systems  Constitutional: Positive for appetite change (decreased appetite) and fatigue.  HENT: Negative.   Eyes: Negative.   Respiratory: Negative.   Cardiovascular: Negative.   Gastrointestinal: Negative.   Endocrine: Negative.   Genitourinary: Negative.   Musculoskeletal: Positive for arthralgias (generalized).  Skin: Negative.   Allergic/Immunologic: Negative.   Neurological: Positive for dizziness (occasional ) and headaches (occasional ).  Hematological: Negative.   Psychiatric/Behavioral: Negative.       Objective:    Physical Exam  Constitutional: She is oriented to person, place, and time. She appears well-developed and well-nourished.  HENT:  Head: Normocephalic and atraumatic.  Eyes: Conjunctivae are normal.  Neck: Normal range of motion. Neck supple.  Cardiovascular: Normal rate, regular rhythm and normal heart sounds.  Pulmonary/Chest: Effort normal and breath sounds normal.  Abdominal: Soft. Bowel sounds are normal.  Musculoskeletal:     Comments: Limited ROM in spine  Neurological: She is alert and oriented to person, place, and time. She has normal reflexes.  Skin: Skin is warm and dry.  Psychiatric: She has a normal mood and affect. Her behavior is normal. Judgment and thought content normal.  Vitals reviewed.   BP 108/63 (BP  Location: Left Arm, Patient Position: Sitting, Cuff Size: Normal)   Pulse 97   Temp 98.3 F (36.8 C) (Oral)   Ht 5' (1.524 m)   Wt 121 lb 9.6 oz (55.2 kg)   LMP 08/19/2019   SpO2 98%   BMI 23.75 kg/m  Wt Readings from Last 3 Encounters:  08/26/19 121 lb 9.6 oz (55.2 kg)  07/14/19 115 lb 6.4 oz (52.3 kg)  07/02/19 114 lb 10.2 oz (52 kg)     Health Maintenance Due  Topic Date Due  . TETANUS/TDAP  08/31/2005  . PAP SMEAR-Modifier  09/01/2007  . INFLUENZA VACCINE  06/21/2019  There are no preventive care reminders to display for this patient.  No results found for: TSH Lab Results  Component Value Date   WBC 11.6 (H) 08/11/2019   HGB 8.3 (L) 08/11/2019   HCT 24.0 (L) 08/11/2019   MCV 102.1 (H) 08/11/2019   PLT 338 08/11/2019   Lab Results  Component Value Date   NA 137 08/11/2019   K 3.8 08/11/2019   CO2 22 08/11/2019   GLUCOSE 90 08/11/2019   BUN 5 (L) 08/11/2019   CREATININE 0.44 08/11/2019   BILITOT 3.0 (H) 08/11/2019   ALKPHOS 44 08/11/2019   AST 36 08/11/2019   ALT 19 08/11/2019   PROT 8.0 08/11/2019   ALBUMIN 4.4 08/11/2019   CALCIUM 9.0 08/11/2019   ANIONGAP 7 08/11/2019   No results found for: CHOL No results found for: HDL No results found for: LDLCALC No results found for: TRIG No results found for: CHOLHDL No results found for: [id number]    Assessment & Plan:   1. Hb-SS disease without crisis [facility]HCC) She is doing well today. She will continue to take pain medications as prescribed; will continue to avoid extreme heat and cold; will continue to eat a healthy diet and drink at least 64 ounces of water daily; continue stool softener as needed; will avoid colds and flu; will continue to get plenty of sleep and rest; will continue to avoid high stressful situations and remain infection free; will continue Folic Acid 1 mg daily to avoid sickle cell crisis. Continue to follow up with Hematologist as needed.  - oxyCODONE (ROXICODONE) 15 MG immediate release  tablet; Take 1 tablet (15 mg total) by mouth every 6 (six) hours as needed for pain.  Dispense: 60 tablet; Refill: 0  2. Chronic, continuous use of opioids - oxyCODONE (ROXICODONE) 15 MG immediate release tablet; Take 1 tablet (15 mg total) by mouth every 6 (six) hours as needed for pain.  Dispense: 60 tablet; Refill: 0  3. Other chronic pain - oxyCODONE (ROXICODONE) 15 MG immediate release tablet; Take 1 tablet (15 mg total) by mouth every 6 (six) hours as needed for pain.  Dispense: 60 tablet; Refill: 0  4. Decreased appetite - Ensure Plus (ENSURE PLUS) LIQD; Take 237 mLs by mouth 3 (three) times daily between meals.  Dispense: 237 mL; Refill: 6  5. Gastroesophageal reflux disease without esophagitis - omeprazole (PRILOSEC) 40 MG capsule; Take 1 capsule (40 mg total) by mouth daily.  Dispense: 30 capsule; Refill: 3  6. Follow up She will follow up in 2 months.    Meds ordered this encounter  Medications  . Ensure Plus (ENSURE PLUS) LIQD    Sig: Take 237 mLs by mouth 3 (three) times daily between meals.    Dispense:  237 mL    Refill:  6  . omeprazole (PRILOSEC) 40 MG capsule    Sig: Take 1 capsule (40 mg total) by mouth daily.    Dispense:  30 capsule    Refill:  3  . oxyCODONE (ROXICODONE) 15 MG immediate release tablet    Sig: Take 1 tablet (15 mg total) by mouth every 6 (six) hours as needed for pain.    Dispense:  60 tablet    Refill:  0    Order Specific Question:   Supervising Provider    Answer:   Quentin AngstJEGEDE, OLUGBEMIGA E [id number]]    Orders Placed This Encounter  Procedures  . POCT urinalysis dipstick    Referral Orders  No referral(s) requested today  Raliegh Ip,  MSN, FNP-BC [place] Patient Care [facility] [address] [place], [place] [address] [phone] fax   Problem List Items Addressed This Visit      Digestive   Gastroesophageal reflux disease without esophagitis   Relevant  Medications   omeprazole (PRILOSEC) 40 MG capsule     Other   Chronic, continuous use of opioids   Relevant Medications   oxyCODONE (ROXICODONE) 15 MG immediate release tablet   Sickle cell anemia (HCC) - Primary   Relevant Medications   oxyCODONE (ROXICODONE) 15 MG immediate release tablet   Sickle cell crisis (HCC)   Relevant Orders   POCT urinalysis dipstick (Completed)    Other Visit Diagnoses    Other chronic pain       Relevant Medications   oxyCODONE (ROXICODONE) 15 MG immediate release tablet   Decreased appetite       Relevant Medications   Ensure Plus (ENSURE PLUS) LIQD   Follow up          Meds ordered this encounter  Medications  . Ensure Plus (ENSURE PLUS) LIQD    Sig: Take 237 mLs by mouth 3 (three) times daily between meals.    Dispense:  237 mL    Refill:  6  . omeprazole (PRILOSEC) 40 MG capsule    Sig: Take 1 capsule (40 mg total) by mouth daily.    Dispense:  30 capsule    Refill:  3  . oxyCODONE (ROXICODONE) 15 MG immediate release tablet    Sig: Take 1 tablet (15 mg total) by mouth every 6 (six) hours as needed for pain.    Dispense:  60 tablet    Refill:  0    Order Specific Question:   Supervising Provider    Answer:   Quentin Angst [[id number]]    Follow-up: Return in about 2 months (around 10/26/2019).    [name], FNP

## 2019-08-27 LAB — POCT URINALYSIS DIPSTICK
Bilirubin, UA: NEGATIVE
Blood, UA: NEGATIVE
Glucose, UA: NEGATIVE
Ketones, UA: NEGATIVE
Leukocytes, UA: NEGATIVE
Nitrite, UA: NEGATIVE
Protein, UA: NEGATIVE
Spec Grav, UA: 1.02 (ref 1.010–1.025)
Urobilinogen, UA: 1 E.U./dL
pH, UA: 5.5 (ref 5.0–8.0)

## 2019-08-28 DIAGNOSIS — R630 Anorexia: Secondary | ICD-10-CM | POA: Insufficient documentation

## 2019-09-09 ENCOUNTER — Other Ambulatory Visit: Payer: Self-pay | Admitting: Family Medicine

## 2019-09-09 ENCOUNTER — Telehealth: Payer: Self-pay | Admitting: Family Medicine

## 2019-09-09 DIAGNOSIS — D571 Sickle-cell disease without crisis: Secondary | ICD-10-CM

## 2019-09-09 DIAGNOSIS — F1190 Opioid use, unspecified, uncomplicated: Secondary | ICD-10-CM

## 2019-09-09 DIAGNOSIS — G8929 Other chronic pain: Secondary | ICD-10-CM

## 2019-09-09 MED ORDER — OXYCODONE HCL 15 MG PO TABS: 15.0000 mg | ORAL_TABLET | Freq: Four times a day (QID) | ORAL | 0 refills | Status: DC | PRN

## 2019-09-10 NOTE — Telephone Encounter (Signed)
done

## 2019-09-22 ENCOUNTER — Other Ambulatory Visit: Payer: Self-pay | Admitting: Family Medicine

## 2019-09-22 ENCOUNTER — Telehealth: Payer: Self-pay | Admitting: Family Medicine

## 2019-09-22 DIAGNOSIS — G8929 Other chronic pain: Secondary | ICD-10-CM

## 2019-09-22 DIAGNOSIS — D571 Sickle-cell disease without crisis: Secondary | ICD-10-CM

## 2019-09-22 DIAGNOSIS — F1190 Opioid use, unspecified, uncomplicated: Secondary | ICD-10-CM

## 2019-09-22 MED ORDER — OXYCODONE HCL 15 MG PO TABS: 15.0000 mg | ORAL_TABLET | Freq: Four times a day (QID) | ORAL | 0 refills | Status: DC | PRN

## 2019-09-22 NOTE — Telephone Encounter (Signed)
done

## 2019-09-24 NOTE — Telephone Encounter (Signed)
Sent to NP 

## 2019-10-06 ENCOUNTER — Other Ambulatory Visit: Payer: Self-pay | Admitting: Family Medicine

## 2019-10-06 ENCOUNTER — Telehealth: Payer: Self-pay | Admitting: Family Medicine

## 2019-10-06 DIAGNOSIS — F1190 Opioid use, unspecified, uncomplicated: Secondary | ICD-10-CM

## 2019-10-06 DIAGNOSIS — G8929 Other chronic pain: Secondary | ICD-10-CM

## 2019-10-06 DIAGNOSIS — D571 Sickle-cell disease without crisis: Secondary | ICD-10-CM

## 2019-10-06 MED ORDER — OXYCODONE HCL 15 MG PO TABS: 15.0000 mg | ORAL_TABLET | Freq: Four times a day (QID) | ORAL | 0 refills | Status: DC | PRN

## 2019-10-06 NOTE — Telephone Encounter (Signed)
Please call Corinda Gubler. THANKS!

## 2019-10-22 ENCOUNTER — Telehealth: Payer: Self-pay | Admitting: Family Medicine

## 2019-10-22 ENCOUNTER — Other Ambulatory Visit: Payer: Self-pay | Admitting: Family Medicine

## 2019-10-22 DIAGNOSIS — D571 Sickle-cell disease without crisis: Secondary | ICD-10-CM

## 2019-10-22 DIAGNOSIS — F1190 Opioid use, unspecified, uncomplicated: Secondary | ICD-10-CM

## 2019-10-22 DIAGNOSIS — G8929 Other chronic pain: Secondary | ICD-10-CM

## 2019-10-22 MED ORDER — OXYCODONE HCL 15 MG PO TABS: 15.0000 mg | ORAL_TABLET | Freq: Four times a day (QID) | ORAL | 0 refills | Status: DC | PRN

## 2019-10-23 ENCOUNTER — Telehealth: Payer: Self-pay

## 2019-10-23 NOTE — Telephone Encounter (Signed)
Sent to NP 

## 2019-10-23 NOTE — Telephone Encounter (Signed)
Patient called and requesting a telephone call from you to discuss sickle cell flaring up since she took a flight. I advised she could discuss this with you at next appointment which is Monday, however she asked if you could just call her. Please advise.

## 2019-10-24 ENCOUNTER — Telehealth: Payer: Self-pay

## 2019-10-24 NOTE — Telephone Encounter (Signed)
Called, no answer. Left a message to call back.  

## 2019-10-24 NOTE — Telephone Encounter (Signed)
Called, no answer. Left a message to call back regarding issues.

## 2019-10-27 ENCOUNTER — Ambulatory Visit: Payer: Medicare Other | Admitting: Family Medicine

## 2019-11-05 ENCOUNTER — Telehealth: Payer: Self-pay | Admitting: Family Medicine

## 2019-11-05 NOTE — Telephone Encounter (Signed)
error 

## 2019-11-06 ENCOUNTER — Other Ambulatory Visit: Payer: Self-pay | Admitting: Family Medicine

## 2019-11-06 DIAGNOSIS — D571 Sickle-cell disease without crisis: Secondary | ICD-10-CM

## 2019-11-06 DIAGNOSIS — F1190 Opioid use, unspecified, uncomplicated: Secondary | ICD-10-CM

## 2019-11-06 DIAGNOSIS — G8929 Other chronic pain: Secondary | ICD-10-CM

## 2019-11-06 MED ORDER — OXYCODONE HCL 15 MG PO TABS: 15.0000 mg | ORAL_TABLET | Freq: Four times a day (QID) | ORAL | 0 refills | Status: DC | PRN

## 2019-11-07 NOTE — Telephone Encounter (Signed)
done

## 2019-11-11 ENCOUNTER — Ambulatory Visit: Payer: Medicare Other | Admitting: Family Medicine

## 2019-11-12 ENCOUNTER — Telehealth: Payer: Self-pay | Admitting: Family Medicine

## 2019-11-12 ENCOUNTER — Ambulatory Visit: Payer: Medicare Other | Admitting: Family Medicine

## 2019-11-12 NOTE — Telephone Encounter (Signed)
Patient scheduled for follow up today, but cancelled, because of transportation issues. Patient requested Telephone visit instead today. We attempted to contact patient via phone with no response. Unable to leave voice message.

## 2019-11-17 NOTE — Telephone Encounter (Signed)
Sent to Np °

## 2019-11-19 ENCOUNTER — Telehealth (HOSPITAL_COMMUNITY): Payer: Self-pay

## 2019-11-19 NOTE — Telephone Encounter (Signed)
Patient called to report sickle cell pain to lower back; rated pain 7-8/10. Patient report  taking last  15 mg Oxycodone, naporxen and robaxin yesterday. Patient was told there were no beds available and advice to refill prescription and begin to take prescribe pain medicine around the clock. Also advise to drink 64 oz of fluid and if pain persist over night to call us in the morning for reassessment.

## 2019-11-20 ENCOUNTER — Other Ambulatory Visit: Payer: Self-pay | Admitting: Family Medicine

## 2019-11-20 ENCOUNTER — Telehealth: Payer: Self-pay

## 2019-11-20 DIAGNOSIS — D571 Sickle-cell disease without crisis: Secondary | ICD-10-CM

## 2019-11-20 DIAGNOSIS — G8929 Other chronic pain: Secondary | ICD-10-CM

## 2019-11-20 DIAGNOSIS — F1190 Opioid use, unspecified, uncomplicated: Secondary | ICD-10-CM

## 2019-11-20 MED ORDER — OXYCODONE HCL 15 MG PO TABS: 15.0000 mg | ORAL_TABLET | Freq: Four times a day (QID) | ORAL | 0 refills | Status: DC | PRN

## 2019-11-20 NOTE — Telephone Encounter (Signed)
Called no answer and no voice mail 

## 2019-11-20 NOTE — Telephone Encounter (Signed)
Voicemail left by patient 11/19/2019 approximately 9am asking for a refill on Oxycodone 15mg  IR. Please advise.

## 2019-11-25 ENCOUNTER — Telehealth: Payer: Self-pay | Admitting: Family Medicine

## 2019-11-26 ENCOUNTER — Other Ambulatory Visit: Payer: Self-pay | Admitting: Family Medicine

## 2019-11-26 DIAGNOSIS — D571 Sickle-cell disease without crisis: Secondary | ICD-10-CM

## 2019-11-26 DIAGNOSIS — G8929 Other chronic pain: Secondary | ICD-10-CM

## 2019-11-26 DIAGNOSIS — F1190 Opioid use, unspecified, uncomplicated: Secondary | ICD-10-CM

## 2019-11-28 NOTE — Telephone Encounter (Signed)
Done

## 2019-12-06 ENCOUNTER — Other Ambulatory Visit: Payer: Self-pay

## 2019-12-06 ENCOUNTER — Encounter (HOSPITAL_COMMUNITY): Payer: Self-pay | Admitting: Emergency Medicine

## 2019-12-06 ENCOUNTER — Emergency Department (HOSPITAL_COMMUNITY)
Admission: EM | Admit: 2019-12-06 | Discharge: 2019-12-06 | Disposition: A | Discharge status: Home / Self Care | Payer: Medicare Other | Attending: Emergency Medicine | Admitting: Emergency Medicine

## 2019-12-06 DIAGNOSIS — D5700 Hb-SS disease with crisis, unspecified: Secondary | ICD-10-CM

## 2019-12-06 DIAGNOSIS — F17210 Nicotine dependence, cigarettes, uncomplicated: Secondary | ICD-10-CM | POA: Insufficient documentation

## 2019-12-06 DIAGNOSIS — R350 Frequency of micturition: Secondary | ICD-10-CM | POA: Insufficient documentation

## 2019-12-06 DIAGNOSIS — F1190 Opioid use, unspecified, uncomplicated: Secondary | ICD-10-CM

## 2019-12-06 DIAGNOSIS — Z20822 Contact with and (suspected) exposure to covid-19: Secondary | ICD-10-CM | POA: Insufficient documentation

## 2019-12-06 DIAGNOSIS — Z789 Other specified health status: Secondary | ICD-10-CM

## 2019-12-06 LAB — COMPREHENSIVE METABOLIC PANEL
ALT: 25 U/L (ref 0–44)
AST: 52 U/L — ABNORMAL HIGH (ref 15–41)
Albumin: 4.6 g/dL (ref 3.5–5.0)
Alkaline Phosphatase: 58 U/L (ref 38–126)
Anion gap: 8 (ref 5–15)
BUN: 6 mg/dL (ref 6–20)
CO2: 25 mmol/L (ref 22–32)
Calcium: 9 mg/dL (ref 8.9–10.3)
Chloride: 104 mmol/L (ref 98–111)
Creatinine, Ser: 0.4 mg/dL — ABNORMAL LOW (ref 0.44–1.00)
GFR calc Af Amer: >60 mL/min (ref >60)
GFR calc non Af Amer: >60 mL/min (ref >60)
Glucose, Bld: 91 mg/dL (ref 70–99)
Potassium: 4.1 mmol/L (ref 3.5–5.1)
Sodium: 137 mmol/L (ref 135–145)
Total Bilirubin: 3.5 mg/dL — ABNORMAL HIGH (ref 0.3–1.2)
Total Protein: 8.8 g/dL — ABNORMAL HIGH (ref 6.5–8.1)

## 2019-12-06 LAB — URINALYSIS, ROUTINE W REFLEX MICROSCOPIC
Bilirubin Urine: NEGATIVE
Glucose, UA: NEGATIVE mg/dL
Hgb urine dipstick: NEGATIVE
Ketones, ur: NEGATIVE mg/dL
Leukocytes,Ua: NEGATIVE
Nitrite: NEGATIVE
Protein, ur: NEGATIVE mg/dL
Specific Gravity, Urine: 1.01 (ref 1.005–1.030)
pH: 6 (ref 5.0–8.0)

## 2019-12-06 LAB — CBC WITH DIFFERENTIAL/PLATELET
Abs Immature Granulocytes: 0.1 10*3/uL — ABNORMAL HIGH (ref 0.00–0.07)
Basophils Absolute: 0.1 10*3/uL (ref 0.0–0.1)
Basophils Relative: 1 %
Eosinophils Absolute: 0.4 10*3/uL (ref 0.0–0.5)
Eosinophils Relative: 3 %
HCT: 25.1 % — ABNORMAL LOW (ref 36.0–46.0)
Hemoglobin: 8.6 g/dL — ABNORMAL LOW (ref 12.0–15.0)
Immature Granulocytes: 1 %
Lymphocytes Relative: 59 %
Lymphs Abs: 8 10*3/uL — ABNORMAL HIGH (ref 0.7–4.0)
MCH: 35.2 pg — ABNORMAL HIGH (ref 26.0–34.0)
MCHC: 34.3 g/dL (ref 30.0–36.0)
MCV: 102.9 fL — ABNORMAL HIGH (ref 80.0–100.0)
Monocytes Absolute: 1 10*3/uL (ref 0.1–1.0)
Monocytes Relative: 7 %
Neutro Abs: 4 10*3/uL (ref 1.7–7.7)
Neutrophils Relative %: 29 %
Platelets: 366 10*3/uL (ref 150–400)
RBC: 2.44 MIL/uL — ABNORMAL LOW (ref 3.87–5.11)
RDW: 22.2 % — ABNORMAL HIGH (ref 11.5–15.5)
WBC: 13.5 10*3/uL — ABNORMAL HIGH (ref 4.0–10.5)
nRBC: 3 % — ABNORMAL HIGH (ref 0.0–0.2)

## 2019-12-06 LAB — RETICULOCYTES
Immature Retic Fract: 41.3 % — ABNORMAL HIGH (ref 2.3–15.9)
RBC.: 2.44 MIL/uL — ABNORMAL LOW (ref 3.87–5.11)
Retic Count, Absolute: 0.1 10*3/uL — ABNORMAL LOW (ref 19.0–186.0)
Retic Ct Pct: 16.3 % — ABNORMAL HIGH (ref 0.4–3.1)

## 2019-12-06 LAB — I-STAT BETA HCG BLOOD, ED (MC, WL, AP ONLY): I-stat hCG, quantitative: <5 m[IU]/mL (ref <5)

## 2019-12-06 MED ORDER — KETOROLAC TROMETHAMINE 15 MG/ML IJ SOLN
15.0000 mg | Freq: Once | INTRAMUSCULAR | Status: AC
  Administered 2019-12-06: 15 mg via INTRAVENOUS
  Filled 2019-12-06: qty 1

## 2019-12-06 MED ORDER — DIPHENHYDRAMINE HCL 50 MG/ML IJ SOLN
25.0000 mg | Freq: Once | INTRAMUSCULAR | Status: DC
  Filled 2019-12-06: qty 1

## 2019-12-06 MED ORDER — HYDROMORPHONE HCL 2 MG/ML IJ SOLN: 2.0000 mg | INTRAMUSCULAR | Status: AC

## 2019-12-06 MED ORDER — ONDANSETRON HCL 4 MG/2ML IJ SOLN
4.0000 mg | INTRAMUSCULAR | Status: DC | PRN
  Administered 2019-12-06: 4 mg via INTRAVENOUS
  Filled 2019-12-06: qty 2

## 2019-12-06 MED ORDER — DIPHENHYDRAMINE HCL 25 MG PO CAPS: 25.0000 mg | ORAL_CAPSULE | ORAL | Status: DC | PRN

## 2019-12-06 MED ORDER — SODIUM CHLORIDE 0.45 % IV SOLN: INTRAVENOUS | Status: DC

## 2019-12-06 MED ORDER — HYDROMORPHONE HCL 2 MG/ML IJ SOLN: 2.0000 mg | INTRAMUSCULAR | Status: DC

## 2019-12-06 MED ORDER — HYDROMORPHONE HCL 2 MG/ML IJ SOLN
2.0000 mg | INTRAMUSCULAR | Status: DC
  Administered 2019-12-06: 2 mg via INTRAVENOUS

## 2019-12-06 MED ORDER — HYDROMORPHONE HCL 2 MG/ML IJ SOLN
2.0000 mg | INTRAMUSCULAR | Status: AC
  Filled 2019-12-06: qty 1

## 2019-12-06 NOTE — ED Notes (Signed)
Culture sent to lab with urine 

## 2019-12-06 NOTE — Discharge Instructions (Signed)
Abusive behavior towards staff or others will not be tolerated. We understand you are in pain but this does not excuse inappropriate behavior. Follow-up with your normal providers.

## 2019-12-06 NOTE — ED Notes (Addendum)
Pt pulled out her IV and would not allow staff members to assist her in controlling it. Security present.

## 2019-12-06 NOTE — ED Notes (Addendum)
When attempting medicate this patient. This RN administered IV Zofran followed by a flush, then IV Toradol. Patient physically hit this nurses hand and said "Francesca Oman are really starting to piss me the fuck off. You are pushing that too fast and my IV has been in and burns. I know you are doing it just to piss me off." This nurse apologized for any misunderstanding that I can push it slower or dilute it more if it will help. Patient continues to yell "You know what get the fuck out of my room, I want to be admitted. Ive been sitting here waiting and you all have too much going on down here so just get me upstairs." This nurse explained that I do have the pain medication she is requesting and her nurse is still with a critical patient, that I can let the doctor know about her request but would like to give her the medication since I am available if she is okay with that. Patient ignored this nurse, playing on her phone. This nurse exited room per request and made her nurse and charge RN aware of the situation.

## 2019-12-06 NOTE — ED Triage Notes (Signed)
Per pt, states she has been "sickling" on her left side for a couple of days-states it is now moving to her right side-home meds not working

## 2019-12-06 NOTE — ED Provider Notes (Signed)
[facility]-EMERGENCY DEPT Provider Note   CSN: [id number] Arrival date & time: 12/06/19  1543     History Chief Complaint  Patient presents with  . Sickle Cell Pain Crisis    Kara [name] is a 34 y.o. female with a past medical history of sickle cell anemia, pulmonary hypertension, who presents today for evaluation of sickle cell pain crisis.  She reports that yesterday she ran out of her home narcotic pain medicine.  She states that she frequently has to double up the dosage to get adequate relief of her pain.  She states that over the past 2 to 3 days she has had pain that is consistent with her usual sickle cell pain.  She states that it started on the left side of her back, hip, and leg and is now on the right side.  She also reports that she has pain in her vulva which is normal with her sickle cell pain crises.  She states that she has no concern for this being a cause for her symptoms other than her sickle cell pain crises.  She denies any fevers, cough, chest pain, or shortness of breath.  No abdominal pain.  She does report urinary frequency, urgency, and dysuria.  She denies any abnormal vaginal discharge.    HPI     Past Medical History:  Diagnosis Date  . Arthritis   . Chronic migraine   . GERD (gastroesophageal reflux disease)   . Hx of cholecystectomy 2015  . Pulmonary hypertension (HCC)   . Sickle cell anemia (HCC)   . Tendinitis    left elbow    Patient Active Problem List   Diagnosis Date Noted  . Decreased appetite 08/28/2019  . Chronic pain of right knee July 28, 2019  . Gastroesophageal reflux disease without esophagitis 07/28/2019  . Anxiety 07-28-2019  . Death of family member 07-28-19  . Chronic, continuous use of opioids July 28, 2019  . Headache, new daily persistent ([facility]) 07/04/2019  . Sickle cell anemia (HCC) 07/01/2019  . Chronic pain syndrome 12/27/2018  . Anemia of chronic disease 12/27/2018  . Pulmonary hypertension  (HCC) 04/04/2018  . Sickle cell disease (HCC) 04/04/2018  . Arthritis 04/04/2018  . Chest pain on breathing   . Leukocytosis 02/06/2018  . Sickle cell crisis (HCC) 02/05/2018  . Sickle cell pain crisis (HCC) 10/29/2017    Past Surgical History:  Procedure Laterality Date  . DILATION AND CURETTAGE OF UTERUS    . GALLBLADDER SURGERY       OB History   No obstetric history on file.     Family History  Adopted: Yes    Social History   Tobacco Use  . Smoking status: Current Every Day Smoker  . Smokeless tobacco: Never Used  Substance Use Topics  . Alcohol use: No  . Drug use: Not on file    Home Medications Prior to Admission medications   Medication Sig Start Date End Date Taking? Authorizing Provider  butalbital-acetaminophen-caffeine (FIORICET) 50-325-40 MG tablet Take 1 tablet by mouth every 6 (six) hours as needed for headache. 07/06/19   [name], MD  clonazePAM (KLONOPIN) 1 MG tablet Take 1 tablet (1 mg total) by mouth daily. 07/14/19   [name], FNP  Ensure Plus (ENSURE PLUS) LIQD Take 237 mLs by mouth 3 (three) times daily between meals. 08/26/19   [name], FNP  fluticasone (FLONASE) 50 MCG/ACT nasal spray Place 2 sprays into both nostrils daily. 02/20/19   Menshew, Smith Robert  Tomasa Blase, PA-C  folic acid (FOLVITE) 1 MG tablet Take 1 tablet (1 mg total) by mouth daily. 07/06/19   [name], MD  ibuprofen (ADVIL) 200 MG tablet Take 800 mg by mouth every 6 (six) hours as needed for moderate pain.    [provider]  methocarbamol (ROBAXIN) 500 MG tablet Take 1 tablet (500 mg total) by mouth 4 (four) times daily. 07/06/19   [name], MD  naproxen (NAPROSYN) 500 MG tablet Take 1 tablet (500 mg total) by mouth 2 (two) times daily with a meal. 07/14/19   [name], FNP  omeprazole (PRILOSEC) 40 MG capsule Take 1 capsule (40 mg total) by mouth daily. 08/26/19   [name], FNP  oxyCODONE (ROXICODONE) 15 MG immediate  release tablet Take 1 tablet (15 mg total) by mouth every 6 (six) hours as needed for pain. 11/20/19   [name], FNP  Vitamin D, Ergocalciferol, (DRISDOL) 1.25 MG (50000 UT) CAPS capsule Take 1 capsule (50,000 Units total) by mouth every 7 (seven) days. 07/21/19   [name], FNP    Allergies    Kiwi extract, Morphine and related, and Paroxetine  Review of Systems   Review of Systems  Constitutional: Negative for chills and fever.  Respiratory: Negative for cough, chest tightness and shortness of breath.   Cardiovascular: Negative for chest pain.  Gastrointestinal: Negative for abdominal pain, diarrhea, nausea and vomiting.  Genitourinary: Positive for dysuria, frequency and urgency. Negative for flank pain.  Musculoskeletal: Positive for back pain and myalgias. Negative for joint swelling and neck pain.  Skin: Negative for color change, rash and wound.  Neurological: Negative for weakness and headaches.  All other systems reviewed and are negative.   Physical Exam Updated Vital Signs BP (!) 153/96   Pulse 94   Temp 98.8 F (37.1 C) (Oral)   Resp 15   Ht 5\' 5"  (1.651 m)   Wt 54.4 kg   LMP 11/20/2019   SpO2 100%   BMI 19.97 kg/m   Physical Exam Vitals and nursing note reviewed.  Constitutional:      General: She is not in acute distress.    Appearance: Normal appearance. She is well-developed. She is not ill-appearing or diaphoretic.  HENT:     Head: Normocephalic and atraumatic.  Eyes:     Conjunctiva/sclera: Conjunctivae normal.  Cardiovascular:     Rate and Rhythm: Normal rate and regular rhythm.     Heart sounds: No murmur.  Pulmonary:     Effort: Pulmonary effort is normal. No respiratory distress.     Breath sounds: Normal breath sounds.  Abdominal:     General: Abdomen is flat. There is no distension.     Palpations: Abdomen is soft.     Tenderness: There is no abdominal tenderness. There is no right CVA tenderness, left CVA tenderness or  guarding.  Musculoskeletal:     Cervical back: Normal range of motion and neck supple.     Right lower leg: No edema.     Left lower leg: No edema.     Comments: Diffuse tenderness to palpation over bilateral lumbar areas without midline tenderness to palpation.     Skin:    General: Skin is warm and dry.  Neurological:     General: No focal deficit present.     Mental Status: She is alert.     Comments: Awake and alert, speech is clear.  She is able to provide appropriate history.  Psychiatric:  Mood and Affect: Mood normal.        Behavior: Behavior normal.     ED Results / Procedures / Treatments   Labs (all labs ordered are listed, but only abnormal results are displayed) Labs Reviewed  COMPREHENSIVE METABOLIC PANEL - Abnormal; Notable for the following components:      Result Value   Creatinine, Ser 0.40 (*)    Total Protein 8.8 (*)    AST 52 (*)    Total Bilirubin 3.5 (*)    All other components within normal limits  CBC WITH DIFFERENTIAL/PLATELET - Abnormal; Notable for the following components:   WBC 13.5 (*)    RBC 2.44 (*)    Hemoglobin 8.6 (*)    HCT 25.1 (*)    MCV 102.9 (*)    MCH 35.2 (*)    RDW 22.2 (*)    nRBC 3.0 (*)    Lymphs Abs 8.0 (*)    Abs Immature Granulocytes 0.10 (*)    All other components within normal limits  RETICULOCYTES - Abnormal; Notable for the following components:   Retic Ct Pct 16.3 (*)    RBC. 2.44 (*)    Retic Count, Absolute 0.1 (*)    Immature Retic Fract 41.3 (*)    All other components within normal limits  URINE CULTURE  SARS CORONAVIRUS 2 (TAT 6-24 HRS)  URINALYSIS, ROUTINE W REFLEX MICROSCOPIC  I-STAT BETA HCG BLOOD, ED ([facility], [facility], [facility] ONLY)    EKG None  Radiology No results found.  Procedures Procedures (including critical care time)  Medications Ordered in ED Medications  HYDROmorphone (DILAUDID) injection 2 mg (2 mg Intravenous Refused 12/06/19 2050)    Or  HYDROmorphone (DILAUDID) injection 2 mg (  Subcutaneous See Alternative 12/06/19 2050)  HYDROmorphone (DILAUDID) injection 2 mg (has no administration in time range)    Or  HYDROmorphone (DILAUDID) injection 2 mg (has no administration in time range)  HYDROmorphone (DILAUDID) injection 2 mg (has no administration in time range)    Or  HYDROmorphone (DILAUDID) injection 2 mg (has no administration in time range)  HYDROmorphone (DILAUDID) injection 2 mg (2 mg Intravenous Given 12/06/19 2059)    Or  HYDROmorphone (DILAUDID) injection 2 mg ( Subcutaneous See Alternative 12/06/19 2059)  diphenhydrAMINE (BENADRYL) injection 25 mg (25 mg Intravenous Refused 12/06/19 2050)  diphenhydrAMINE (BENADRYL) capsule 25 mg (has no administration in time range)  ondansetron (ZOFRAN) injection 4 mg (4 mg Intravenous Given 12/06/19 2051)  0.45 % sodium chloride infusion ( Intravenous New Bag/Given (Non-Interop) 12/06/19 2109)  ketorolac (TORADOL) 15 MG/ML injection 15 mg (15 mg Intravenous Given 12/06/19 2051)    ED Course  I have reviewed the triage vital signs and the nursing notes.  Pertinent labs & imaging results that were available during my care of the patient were reviewed by me and considered in my medical decision making (see chart for details).  Clinical Course as of Dec 05 2121  Sat Dec 06, 2019  1814 Oxy 15 q6h out since yesterday.    [EH]    Clinical Course User Index [EH] Ollen Gross   MDM Rules/Calculators/A&P                     Patient presents today for evaluation of a sickle cell pain crisis.  She ran out of her home pain medicines as she states that she frequently doubles her dose to control her pain.  Labs are obtained and reviewed, WBC, hemoglobin consistent with her  baseline.  CMP is consistent with her baseline.  Orders placed for IV Dilaudid with sickle cell order set.  She is opioid tolerant based on the algorithm. Her creatinine is not elevated, Toradol is ordered.  Benadryl was ordered as needed for  itching.  Patient was discussed with Dr. Juleen China.  At shift change care was transferred to Dr. Juleen China who will follow any pending studies, and reevaluate after pain medications.  Final Clinical Impression(s) / ED Diagnoses Final diagnoses:  Sickle cell pain crisis [facility])  Has run out of medications    Rx / DC Orders ED Discharge Orders    None       Norman Clay 12/06/19 2126    Raeford Razor, MD 12/07/19 [phone]

## 2019-12-06 NOTE — ED Provider Notes (Signed)
Medical screening examination/treatment/procedure(s) were conducted as a shared visit with non-physician practitioner(s) and myself.  I personally evaluated the patient during the encounter.    33yF with pain related to sickle cell anemia. Labs near baseline. I doubt emergent complication of sickle cell anemia or otherwise. Initial plan was likely admission for symptom control. Unfortunately, she began behaving inappropriately towards staff. I explained to her that abusive behavior would not be tolerated. I acknowledged her concerns but that they are not an excuse for inappropriate behavior. She was advised to follow-up with her typical providers and discharged.    Raeford Razor, MD 12/06/19 2144

## 2019-12-06 NOTE — ED Notes (Signed)
Pt stated she "did not care if someone was dying or died, she did not need to wait on her pain medication". Pt also stated "the pt coding has nothing to do with her and she shouldn't have to wait this long for medication".

## 2019-12-07 LAB — SARS CORONAVIRUS 2 (TAT 6-24 HRS): SARS Coronavirus 2: NEGATIVE

## 2019-12-08 ENCOUNTER — Non-Acute Institutional Stay (HOSPITAL_COMMUNITY)
Admission: AD | Admit: 2019-12-08 | Discharge: 2019-12-08 | Disposition: A | Discharge status: Home / Self Care | Payer: Medicare Other | Source: Ambulatory Visit | Attending: Internal Medicine | Admitting: Internal Medicine

## 2019-12-08 ENCOUNTER — Telehealth (HOSPITAL_COMMUNITY): Payer: Self-pay

## 2019-12-08 ENCOUNTER — Telehealth: Payer: Self-pay | Admitting: Family Medicine

## 2019-12-08 ENCOUNTER — Encounter (HOSPITAL_COMMUNITY): Payer: Self-pay | Admitting: Internal Medicine

## 2019-12-08 ENCOUNTER — Other Ambulatory Visit: Payer: Self-pay | Admitting: Family Medicine

## 2019-12-08 DIAGNOSIS — Z791 Long term (current) use of non-steroidal anti-inflammatories (NSAID): Secondary | ICD-10-CM | POA: Diagnosis not present

## 2019-12-08 DIAGNOSIS — G8929 Other chronic pain: Secondary | ICD-10-CM

## 2019-12-08 DIAGNOSIS — I272 Pulmonary hypertension, unspecified: Secondary | ICD-10-CM | POA: Insufficient documentation

## 2019-12-08 DIAGNOSIS — F1190 Opioid use, unspecified, uncomplicated: Secondary | ICD-10-CM

## 2019-12-08 DIAGNOSIS — Z885 Allergy status to narcotic agent status: Secondary | ICD-10-CM | POA: Insufficient documentation

## 2019-12-08 DIAGNOSIS — F329 Major depressive disorder, single episode, unspecified: Secondary | ICD-10-CM | POA: Insufficient documentation

## 2019-12-08 DIAGNOSIS — M1990 Unspecified osteoarthritis, unspecified site: Secondary | ICD-10-CM | POA: Insufficient documentation

## 2019-12-08 DIAGNOSIS — F419 Anxiety disorder, unspecified: Secondary | ICD-10-CM | POA: Insufficient documentation

## 2019-12-08 DIAGNOSIS — D5700 Hb-SS disease with crisis, unspecified: Secondary | ICD-10-CM | POA: Diagnosis present

## 2019-12-08 DIAGNOSIS — Z79899 Other long term (current) drug therapy: Secondary | ICD-10-CM | POA: Insufficient documentation

## 2019-12-08 DIAGNOSIS — G894 Chronic pain syndrome: Secondary | ICD-10-CM | POA: Insufficient documentation

## 2019-12-08 DIAGNOSIS — Z79891 Long term (current) use of opiate analgesic: Secondary | ICD-10-CM | POA: Diagnosis not present

## 2019-12-08 DIAGNOSIS — K219 Gastro-esophageal reflux disease without esophagitis: Secondary | ICD-10-CM | POA: Diagnosis not present

## 2019-12-08 DIAGNOSIS — D571 Sickle-cell disease without crisis: Secondary | ICD-10-CM

## 2019-12-08 LAB — CBC WITH DIFFERENTIAL/PLATELET
Abs Immature Granulocytes: 0.15 10*3/uL — ABNORMAL HIGH (ref 0.00–0.07)
Basophils Absolute: 0.1 10*3/uL (ref 0.0–0.1)
Basophils Relative: 1 %
Eosinophils Absolute: 0.4 10*3/uL (ref 0.0–0.5)
Eosinophils Relative: 3 %
HCT: 23.8 % — ABNORMAL LOW (ref 36.0–46.0)
Hemoglobin: 8.5 g/dL — ABNORMAL LOW (ref 12.0–15.0)
Immature Granulocytes: 1 %
Lymphocytes Relative: 51 %
Lymphs Abs: 7.3 10*3/uL — ABNORMAL HIGH (ref 0.7–4.0)
MCH: 35.7 pg — ABNORMAL HIGH (ref 26.0–34.0)
MCHC: 35.7 g/dL (ref 30.0–36.0)
MCV: 100 fL (ref 80.0–100.0)
Monocytes Absolute: 1.2 10*3/uL — ABNORMAL HIGH (ref 0.1–1.0)
Monocytes Relative: 9 %
Neutro Abs: 4.9 10*3/uL (ref 1.7–7.7)
Neutrophils Relative %: 35 %
Platelets: 363 10*3/uL (ref 150–400)
RBC: 2.38 MIL/uL — ABNORMAL LOW (ref 3.87–5.11)
RDW: 23.6 % — ABNORMAL HIGH (ref 11.5–15.5)
WBC: 14 10*3/uL — ABNORMAL HIGH (ref 4.0–10.5)
nRBC: 1.5 % — ABNORMAL HIGH (ref 0.0–0.2)

## 2019-12-08 LAB — URINALYSIS, ROUTINE W REFLEX MICROSCOPIC
Bilirubin Urine: NEGATIVE
Glucose, UA: NEGATIVE mg/dL
Hgb urine dipstick: NEGATIVE
Ketones, ur: NEGATIVE mg/dL
Leukocytes,Ua: NEGATIVE
Nitrite: NEGATIVE
Protein, ur: NEGATIVE mg/dL
Specific Gravity, Urine: 1.011 (ref 1.005–1.030)
pH: 6 (ref 5.0–8.0)

## 2019-12-08 LAB — URINE CULTURE: Culture: NO GROWTH

## 2019-12-08 MED ORDER — KETOROLAC TROMETHAMINE 15 MG/ML IJ SOLN: 15.0000 mg | Freq: Four times a day (QID) | INTRAMUSCULAR | Status: DC

## 2019-12-08 MED ORDER — SODIUM CHLORIDE 0.45 % IV SOLN: INTRAVENOUS | Status: DC

## 2019-12-08 MED ORDER — HYDROMORPHONE 1 MG/ML IV SOLN
INTRAVENOUS | Status: DC
  Administered 2019-12-08: 30 mg via INTRAVENOUS
  Administered 2019-12-08: 9.5 mg via INTRAVENOUS
  Filled 2019-12-08: qty 30

## 2019-12-08 MED ORDER — DIPHENHYDRAMINE HCL 25 MG PO CAPS
25.0000 mg | ORAL_CAPSULE | ORAL | Status: DC | PRN
  Administered 2019-12-08: 25 mg via ORAL
  Filled 2019-12-08: qty 1

## 2019-12-08 MED ORDER — ONDANSETRON HCL 4 MG/2ML IJ SOLN: 4.0000 mg | Freq: Four times a day (QID) | INTRAMUSCULAR | Status: DC | PRN

## 2019-12-08 MED ORDER — ACETAMINOPHEN 325 MG PO TABS
650.0000 mg | ORAL_TABLET | ORAL | Status: DC | PRN
  Administered 2019-12-08: 650 mg via ORAL
  Filled 2019-12-08: qty 2

## 2019-12-08 MED ORDER — SENNOSIDES-DOCUSATE SODIUM 8.6-50 MG PO TABS: 1.0000 | ORAL_TABLET | Freq: Two times a day (BID) | ORAL | Status: DC

## 2019-12-08 MED ORDER — OXYCODONE HCL 15 MG PO TABS: 15.0000 mg | ORAL_TABLET | Freq: Four times a day (QID) | ORAL | 0 refills | Status: DC | PRN

## 2019-12-08 MED ORDER — SODIUM CHLORIDE 0.9 % IV SOLN
25.0000 mg | INTRAVENOUS | Status: DC | PRN
  Filled 2019-12-08: qty 0.5

## 2019-12-08 MED ORDER — POLYETHYLENE GLYCOL 3350 17 G PO PACK: 17.0000 g | PACK | Freq: Every day | ORAL | Status: DC | PRN

## 2019-12-08 MED ORDER — OXYCODONE HCL ER 10 MG PO T12A: 10.0000 mg | EXTENDED_RELEASE_TABLET | Freq: Two times a day (BID) | ORAL | 0 refills | Status: DC

## 2019-12-08 MED ORDER — ONDANSETRON 4 MG PO TBDP
4.0000 mg | ORAL_TABLET | Freq: Once | ORAL | Status: AC
  Administered 2019-12-08: 4 mg via ORAL
  Filled 2019-12-08: qty 1

## 2019-12-08 MED ORDER — ACETAMINOPHEN 650 MG RE SUPP
650.0000 mg | RECTAL | Status: DC | PRN
  Filled 2019-12-08: qty 1

## 2019-12-08 MED ORDER — NALOXONE HCL 0.4 MG/ML IJ SOLN: 0.4000 mg | INTRAMUSCULAR | Status: DC | PRN

## 2019-12-08 MED ORDER — SODIUM CHLORIDE 0.9% FLUSH: 9.0000 mL | INTRAVENOUS | Status: DC | PRN

## 2019-12-08 NOTE — Progress Notes (Signed)
Patient admitted to day hospital for treatment of sickle cell pain. Initially patient reported pain 7/10. Patient placed on a Dilaudid PCA and hydrated with fluids. At discharge, patient rated pain 5/10.  Vitals stable and discharge instructions given. Patient alert, orientated and ambulatory at discharge.

## 2019-12-08 NOTE — Discharge Instructions (Signed)
Sickle Cell Anemia, Adult  Sickle cell anemia is a condition where your red blood cells are shaped like sickles. Red blood cells carry oxygen through the body. Sickle-shaped cells do not live as long as normal red blood cells. They also clump together and block blood from flowing through the blood vessels. This prevents the body from getting enough oxygen. Sickle cell anemia causes organ damage and pain. It also increases the risk of infection. Follow these instructions at home: Medicines  Take over-the-counter and prescription medicines only as told by your doctor.  If you were prescribed an antibiotic medicine, take it as told by your doctor. Do not stop taking the antibiotic even if you start to feel better.  If you develop a fever, do not take medicines to lower the fever right away. Tell your doctor about the fever. Managing pain, stiffness, and swelling  Try these methods to help with pain: ? Use a heating pad. ? Take a warm bath. ? Distract yourself, such as by watching TV. Eating and drinking  Drink enough fluid to keep your pee (urine) clear or pale yellow. Drink more in hot weather and during exercise.  Limit or avoid alcohol.  Eat a healthy diet. Eat plenty of fruits, vegetables, whole grains, and lean protein.  Take vitamins and supplements as told by your doctor. Traveling  When traveling, keep these with you: ? Your medical information. ? The names of your doctors. ? Your medicines.  If you need to take an airplane, talk to your doctor first. Activity  Rest often.  Avoid exercises that make your heart beat much faster, such as jogging. General instructions  Do not use products that have nicotine or tobacco, such as cigarettes and e-cigarettes. If you need help quitting, ask your doctor.  Consider wearing a medical alert bracelet.  Avoid being in high places (high altitudes), such as mountains.  Avoid very hot or cold temperatures.  Avoid places where the  temperature changes a lot.  Keep all follow-up visits as told by your doctor. This is important. Contact a doctor if:  A joint hurts.  Your feet or hands hurt or swell.  You feel tired (fatigued). Get help right away if:  You have symptoms of infection. These include: ? Fever. ? Chills. ? Being very tired. ? Irritability. ? Poor eating. ? Throwing up (vomiting).  You feel dizzy or faint.  You have new stomach pain, especially on the left side.  You have a an erection (priapism) that lasts more than 4 hours.  You have numbness in your arms or legs.  You have a hard time moving your arms or legs.  You have trouble talking.  You have pain that does not go away when you take medicine.  You are short of breath.  You are breathing fast.  You have a long-term cough.  You have pain in your chest.  You have a bad headache.  You have a stiff neck.  Your stomach looks bloated even though you did not eat much.  Your skin is pale.  You suddenly cannot see well. Summary  Sickle cell anemia is a condition where your red blood cells are shaped like sickles.  Follow your doctor's advice on ways to manage pain, food to eat, activities to do, and steps to take for safe travel.  Get medical help right away if you have any signs of infection, such as a fever. This information is not intended to replace advice given to you by   your health care provider. Make sure you discuss any questions you have with your health care provider. Document Revised: 02/28/2019 Document Reviewed: 12/12/2016 Elsevier Patient Education  2020 [employer]  

## 2019-12-08 NOTE — Telephone Encounter (Signed)
Patient called and reported sickle cell pain to back and legs; rate pain 5-6/10. Reported she ran out of medications Friday night and last ED visit was Saturday.  Tested negative for Covid while in ED, does not report any Covid symptoms.  Patient also does not report fever, chest pain, nausea/vomiting/diarhhea or abdominal pain. Chart reviewed with provider and advise to come in for treatment.

## 2019-12-08 NOTE — Progress Notes (Signed)
Before leaving the unit, patient started feeling nauseous. Zofran 4 mg disintegrating tablet was given. Patient relieved and transported in a wheelchair by the RN.

## 2019-12-08 NOTE — Discharge Summary (Signed)
Physician Discharge Summary  Marely Apgar GEZ:[id number] DOB: 1986/03/19 DOA: 12/08/2019  PCP: Azzie Glatter, FNP  Admit date: 12/08/2019  Discharge date: 12/08/2019  Time spent: 30 minutes  Discharge Diagnoses:  Active Problems:   Sickle cell anemia with pain (HCC)  Discharge Condition: Stable  Diet recommendation: Regular  History of present illness:  Kara [name] is a 34 y.o. female with history of sickle cell disease, chronic pain syndrome, history of pulmonary hypertension, major depression and anxiety who presented to the day hospital this morning with complaints of generalized pain that is consistent with her typical pain crisis.  Patient was seen in the emergency room 3 days ago for the same, she was treated and sent home.  Patient claims she ran out of her home pain medication few days ago and has since been in pain.  Patient said this pain is mostly generalized but worse in her shoulders and lower extremity.  Her current pain intensity is 6-7 over 10 described as throbbing and constant.  Patient denies any fever, chills, recent travel, sick contact or exposure to COVID-19.  She denies any headache, dizziness, cough, chest pain, shortness of breath, dysuria, nausea, vomiting or diarrhea.  Hospital Course:  Gracious Renken was admitted to the day hospital with sickle cell painful crisis. Patient was treated with weight based IV Dilaudid PCA, IV Toradol, clinician assisted doses as deemed appropriate and IV fluids. Ilyse showed significant improvement symptomatically, pain improved from 7 to 5/10 at the time of discharge. Patient was discharged home in a hemodynamically stable condition. Achaia will follow-up at the clinic as previously scheduled, continue with home medications as per prior to admission.  Discharge Instructions We discussed the need for good hydration, monitoring of hydration status, avoidance of heat, cold, stress, and infection triggers. We discussed the need to  be compliant with taking Hydrea and other home medications. Valerie was reminded of the need to seek medical attention immediately if any symptom of bleeding, anemia, or infection occurs.  Discharge Exam: Vitals:   12/08/19 1226 12/08/19 1450  BP: (!) 102/55 (!) 110/52  Pulse: 81 92  Resp: 16 14  Temp: 97.8 F (36.6 C)   SpO2: 96% 93%   General appearance: alert, cooperative and no distress Eyes: conjunctivae/corneas clear. PERRL, EOM's intact. Fundi benign. Neck: no adenopathy, no carotid bruit, no JVD, supple, symmetrical, trachea midline and thyroid not enlarged, symmetric, no tenderness/mass/nodules Back: symmetric, no curvature. ROM normal. No CVA tenderness. Resp: clear to auscultation bilaterally Chest wall: no tenderness Cardio: regular rate and rhythm, S1, S2 normal, no murmur, click, rub or gallop GI: soft, non-tender; bowel sounds normal; no masses,  no organomegaly Extremities: extremities normal, atraumatic, no cyanosis or edema Pulses: 2+ and symmetric Skin: Skin color, texture, turgor normal. No rashes or lesions Neurologic: Grossly normal  Discharge Instructions    Diet - low sodium heart healthy   Complete by: As directed    Increase activity slowly   Complete by: As directed      Allergies as of 12/08/2019      Reactions   Kiwi Extract Hives   Morphine And Related Hives   Paroxetine Hives      Medication List    TAKE these medications   butalbital-acetaminophen-caffeine 50-325-40 MG tablet Commonly known as: FIORICET Take 1 tablet by mouth every 6 (six) hours as needed for headache.   clonazePAM 1 MG tablet Commonly known as: KLONOPIN Take 1 tablet (1 mg total) by mouth daily.   Ensure Plus Liqd  Take 237 mLs by mouth 3 (three) times daily between meals.   fluticasone 50 MCG/ACT nasal spray Commonly known as: Flonase Place 2 sprays into both nostrils daily.   folic acid 1 MG tablet Commonly known as: FOLVITE Take 1 tablet (1 mg total) by mouth  daily.   ibuprofen 200 MG tablet Commonly known as: ADVIL Take 800 mg by mouth every 6 (six) hours as needed for moderate pain.   methocarbamol 500 MG tablet Commonly known as: ROBAXIN Take 1 tablet (500 mg total) by mouth 4 (four) times daily.   naproxen 500 MG tablet Commonly known as: NAPROSYN Take 1 tablet (500 mg total) by mouth 2 (two) times daily with a meal.   omeprazole 40 MG capsule Commonly known as: PRILOSEC Take 1 capsule (40 mg total) by mouth daily.   oxyCODONE 15 MG immediate release tablet Commonly known as: ROXICODONE Take 1 tablet (15 mg total) by mouth every 6 (six) hours as needed for pain.   Vitamin D (Ergocalciferol) 1.25 MG (50000 UNIT) Caps capsule Commonly known as: DRISDOL Take 1 capsule (50,000 Units total) by mouth every 7 (seven) days.      Allergies  Allergen Reactions  . Kiwi Extract Hives  . Morphine And Related Hives  . Paroxetine Hives   Follow-up Information    [name], FNP Follow up.   Specialty: Family Medicine Contact information: [address] [place] [address] [phone]          Significant Diagnostic Studies: No results found.  Signed:  Jeanann Lewandowsky MD, MHA, FACP, Constance Goltz, CPE   12/08/2019, 4:06 PM

## 2019-12-08 NOTE — H&P (Signed)
[facility] History and Physical  Kara [name] ZOX:[id number] DOB: [birth year] DOA: 12/08/2019  PCP: [name], FNP   Chief Complaint: Sickle cell pain  HPI: Kara [name] is a 34 y.o. female with history of sickle cell disease, chronic pain syndrome, history of pulmonary hypertension, major depression and anxiety who presented to the day hospital this morning with complaints of generalized pain that is consistent with her typical pain crisis.  Patient was seen in the emergency room 3 days ago for the same, she was treated and sent home.  Patient claims she ran out of her home pain medication few days ago and has since been in pain.  Patient said this pain is mostly generalized but worse in her shoulders and lower extremity.  Her current pain intensity is 6-7 over 10 described as throbbing and constant.  Patient denies any fever, chills, recent travel, sick contact or exposure to COVID-19.  She denies any headache, dizziness, cough, chest pain, shortness of breath, dysuria, nausea, vomiting or diarrhea.  Systemic Review: General: The patient denies anorexia, fever, weight loss Cardiac: Denies chest pain, syncope, palpitations, pedal edema  Respiratory: Denies cough, shortness of breath, wheezing GI: Denies severe indigestion/heartburn, abdominal pain, nausea, vomiting, diarrhea and constipation GU: Denies hematuria, incontinence, dysuria  Musculoskeletal: Denies arthritis  Skin: Denies suspicious skin lesions Neurologic: Denies focal weakness or numbness, change in vision  Past Medical History:  Diagnosis Date  . Arthritis   . Chronic migraine   . GERD (gastroesophageal reflux disease)   . Hx of cholecystectomy 2015  . Pulmonary hypertension (HCC)   . Sickle cell anemia (HCC)   . Tendinitis    left elbow    Past Surgical History:  Procedure Laterality Date  . DILATION AND CURETTAGE OF UTERUS    . GALLBLADDER SURGERY      Allergies  Allergen Reactions  .  Kiwi Extract Hives  . Morphine And Related Hives  . Paroxetine Hives    Family History  Adopted: Yes      Prior to Admission medications   Medication Sig Start Date End Date Taking? Authorizing Provider  butalbital-acetaminophen-caffeine (FIORICET) 50-325-40 MG tablet Take 1 tablet by mouth every 6 (six) hours as needed for headache. 07/06/19   [name], MD  clonazePAM (KLONOPIN) 1 MG tablet Take 1 tablet (1 mg total) by mouth daily. 07/14/19   [name], FNP  Ensure Plus (ENSURE PLUS) LIQD Take 237 mLs by mouth 3 (three) times daily between meals. 08/26/19   [name], FNP  fluticasone (FLONASE) 50 MCG/ACT nasal spray Place 2 sprays into both nostrils daily. 02/20/19   Menshew, Charlesetta Ivory, PA-C  folic acid (FOLVITE) 1 MG tablet Take 1 tablet (1 mg total) by mouth daily. 07/06/19   [name], MD  ibuprofen (ADVIL) 200 MG tablet Take 800 mg by mouth every 6 (six) hours as needed for moderate pain.    [provider]  methocarbamol (ROBAXIN) 500 MG tablet Take 1 tablet (500 mg total) by mouth 4 (four) times daily. 07/06/19   [name], MD  naproxen (NAPROSYN) 500 MG tablet Take 1 tablet (500 mg total) by mouth 2 (two) times daily with a meal. 07/14/19   [name], FNP  omeprazole (PRILOSEC) 40 MG capsule Take 1 capsule (40 mg total) by mouth daily. 08/26/19   [name], FNP  oxyCODONE (ROXICODONE) 15 MG immediate release tablet Take 1 tablet (15 mg total) by mouth every 6 (  six) hours as needed for pain. 11/20/19   Azzie Glatter, FNP  Vitamin D, Ergocalciferol, (DRISDOL) 1.25 MG (50000 UT) CAPS capsule Take 1 capsule (50,000 Units total) by mouth every 7 (seven) days. 07/21/19   Azzie Glatter, FNP     Physical Exam: There were no vitals filed for this visit.  General: Alert, awake, afebrile, anicteric, not in obvious distress HEENT: Normocephalic and Atraumatic, Mucous membranes pink                PERRLA; EOM intact; No  scleral icterus,                 Nares: Patent, Oropharynx: Clear, Fair Dentition                 Neck: FROM, no cervical lymphadenopathy, thyromegaly, carotid bruit or JVD;  CHEST WALL: No tenderness  CHEST: Normal respiration, clear to auscultation bilaterally  HEART: Regular rate and rhythm; no murmurs rubs or gallops  BACK: No kyphosis or scoliosis; no CVA tenderness  ABDOMEN: Positive Bowel Sounds, soft, non-tender; no masses, no organomegaly EXTREMITIES: No cyanosis, clubbing, or edema SKIN:  no rash or ulceration  CNS: Alert and Oriented x 4, Nonfocal exam, CN 2-12 intact  Labs on Admission:  Basic Metabolic Panel: Recent Labs  Lab 12/06/19 1552  NA 137  K 4.1  CL 104  CO2 25  GLUCOSE 91  BUN 6  CREATININE 0.40*  CALCIUM 9.0   Liver Function Tests: Recent Labs  Lab 12/06/19 1552  AST 52*  ALT 25  ALKPHOS 58  BILITOT 3.5*  PROT 8.8*  ALBUMIN 4.6   No results for input(s): LIPASE, AMYLASE in the last 168 hours. No results for input(s): AMMONIA in the last 168 hours. CBC: Recent Labs  Lab 12/06/19 1552  WBC 13.5*  NEUTROABS 4.0  HGB 8.6*  HCT 25.1*  MCV 102.9*  PLT 366   Cardiac Enzymes: No results for input(s): CKTOTAL, CKMB, CKMBINDEX, TROPONINI in the last 168 hours.  BNP (last 3 results) No results for input(s): BNP in the last 8760 hours.  ProBNP (last 3 results) No results for input(s): PROBNP in the last 8760 hours.  CBG: No results for input(s): GLUCAP in the last 168 hours.   Assessment/Plan Active Problems:   Sickle cell anemia with pain ([place])   Admits to the Day Hospital  IVF D5 .45% Saline @ 125 mls/hour  Weight based Dilaudid PCA started within 30 minutes of admission  IV Toradol 15 mg Q 6 H  Monitor vitals very closely, Re-evaluate pain scale every hour  2 L of Oxygen by [place]  Patient will be re-evaluated for pain in the context of function and relationship to baseline as care progresses.  If no significant relieve from  pain (remains above 5/10) will transfer patient to inpatient services for further evaluation and management  Most recent lab reviewed by me.  No clinical indication for repeat labs today.  Code Status: Full  Family Communication: None  DVT Prophylaxis: Ambulate as tolerated   Time spent: 61 Minutes  Angelica Chessman, MD, MHA, FACP, FAAP, CPE  If 7PM-7AM, please contact night-coverage [email or web address] 12/08/2019, 10:24 AM

## 2019-12-10 NOTE — Telephone Encounter (Signed)
done

## 2019-12-17 ENCOUNTER — Other Ambulatory Visit: Payer: Self-pay

## 2019-12-17 ENCOUNTER — Encounter: Payer: Self-pay | Admitting: Family Medicine

## 2019-12-17 ENCOUNTER — Ambulatory Visit (INDEPENDENT_AMBULATORY_CARE_PROVIDER_SITE_OTHER): Payer: Medicare Other | Admitting: Family Medicine

## 2019-12-17 VITALS — BP 119/70 | HR 78 | Temp 97.7°F | Ht 65.0 in | Wt 118.6 lb

## 2019-12-17 DIAGNOSIS — E559 Vitamin D deficiency, unspecified: Secondary | ICD-10-CM

## 2019-12-17 DIAGNOSIS — G8929 Other chronic pain: Secondary | ICD-10-CM | POA: Diagnosis not present

## 2019-12-17 DIAGNOSIS — D571 Sickle-cell disease without crisis: Secondary | ICD-10-CM

## 2019-12-17 DIAGNOSIS — Z09 Encounter for follow-up examination after completed treatment for conditions other than malignant neoplasm: Secondary | ICD-10-CM

## 2019-12-17 DIAGNOSIS — F419 Anxiety disorder, unspecified: Secondary | ICD-10-CM

## 2019-12-17 DIAGNOSIS — F1190 Opioid use, unspecified, uncomplicated: Secondary | ICD-10-CM | POA: Diagnosis not present

## 2019-12-17 DIAGNOSIS — Z79899 Other long term (current) drug therapy: Secondary | ICD-10-CM | POA: Diagnosis not present

## 2019-12-17 LAB — POCT URINALYSIS DIPSTICK
Blood, UA: NEGATIVE
Glucose, UA: NEGATIVE
Ketones, UA: NEGATIVE
Leukocytes, UA: NEGATIVE
Nitrite, UA: NEGATIVE
Protein, UA: NEGATIVE
Spec Grav, UA: 1.02 (ref 1.010–1.025)
Urobilinogen, UA: >=8 E.U./dL — AB
pH, UA: 7 (ref 5.0–8.0)

## 2019-12-17 MED ORDER — VITAMIN D (ERGOCALCIFEROL) 1.25 MG (50000 UNIT) PO CAPS: 50000.0000 [IU] | ORAL_CAPSULE | ORAL | 6 refills | Status: DC

## 2019-12-17 MED ORDER — OXYCODONE HCL 15 MG PO TABS: 15.0000 mg | ORAL_TABLET | ORAL | 0 refills | Status: DC | PRN

## 2019-12-17 NOTE — Progress Notes (Signed)
[facility]    Established Patient Office Visit  Subjective:  Patient ID: Kara [name], female    DOB: 1985-11-21  Age: 34 y.o. MRN: [id number]  CC:  Chief Complaint  Patient presents with  . Follow-up    Sickle Cell  . GI Upsets    nausea & burping when eating  . Vascular Access Problem    HPI Kara [name] is a 34 year old female who presents for Follow Up today.  Past Medical History:  Diagnosis Date  . Arthritis   . Chronic migraine   . GERD (gastroesophageal reflux disease)   . Hx of cholecystectomy 2015  . Pulmonary hypertension (HCC)   . Sickle cell anemia (HCC)   . Tendinitis    left elbow   Current Status: Since her last office visit, she is doing well with no complaints. She states that she has pain in her back and legs. She rates her pain today at 6/10. She has not had a hospital visit for Sickle Cell Crisis since 12/08/2019 where she was treated and discharged the same day. She is currently taking all medications as prescribed and staying well hydrated. She states that her short-acting pain medication is not holding her for up to every 6 hours, therefore, she has frequent ED visits when medication is not working. She reports occasional nausea, constipation, dizziness and headaches. She states that her anxiety has been increased, because she had began to 'pick' her skin. She has also, began to smoke Marijuana to relax herself. She denies suicidal ideations, homicidal ideations, or auditory hallucinations. She denies fevers, chills, fatigue, recent infections, weight loss, and night sweats. She has not had any headaches, visual changes, dizziness, and falls. No chest pain, heart palpitations, cough and shortness of breath reported. No reports of GI problems such as nausea, vomiting, diarrhea, and constipation. She has no reports of blood in stools, dysuria and hematuria.   Past Surgical History:  Procedure Laterality Date   . DILATION AND CURETTAGE OF UTERUS    . GALLBLADDER SURGERY      Family History  Adopted: Yes    Social History   Socioeconomic History  . Marital status: Single    Spouse name: Not on file  . Number of children: Not on file  . Years of education: Not on file  . Highest education level: Not on file  Occupational History  . Not on file  Tobacco Use  . Smoking status: Current Every Day Smoker  . Smokeless tobacco: Never Used  Substance and Sexual Activity  . Alcohol use: No  . Drug use: Yes    Types: Marijuana  . Sexual activity: Yes    Birth control/protection: None  Other Topics Concern  . Not on file  Social History Narrative  . Not on file   Social Determinants of Health   Financial Resource Strain:   . Difficulty of Paying Living Expenses: Not on file  Food Insecurity:   . Worried About Programme researcher, broadcasting/film/video in the Last Year: Not on file  . Ran Out of Food in the Last Year: Not on file  Transportation Needs:   . Lack of Transportation (Medical): Not on file  . Lack of Transportation (Non-Medical): Not on file  Physical Activity:   . Days of Exercise per Week: Not on file  . Minutes of Exercise per Session: Not on file  Stress:   . Feeling of Stress : Not on file  Social  Connections:   . Frequency of Communication with Friends and Family: Not on file  . Frequency of Social Gatherings with Friends and Family: Not on file  . Attends Religious Services: Not on file  . Active Member of Clubs or Organizations: Not on file  . Attends Archivist Meetings: Not on file  . Marital Status: Not on file  Intimate Partner Violence:   . Fear of Current or Ex-Partner: Not on file  . Emotionally Abused: Not on file  . Physically Abused: Not on file  . Sexually Abused: Not on file    Outpatient Medications Prior to Visit  Medication Sig Dispense Refill  . butalbital-acetaminophen-caffeine (FIORICET) 50-325-40 MG tablet Take 1 tablet by mouth every 6 (six)  hours as needed for headache. 14 tablet 0  . Ensure Plus (ENSURE PLUS) LIQD Take 237 mLs by mouth 3 (three) times daily between meals. 237 mL 6  . fluticasone (FLONASE) 50 MCG/ACT nasal spray Place 2 sprays into both nostrils daily. 16 g 0  . folic acid (FOLVITE) 1 MG tablet Take 1 tablet (1 mg total) by mouth daily. 30 tablet 3  . methocarbamol (ROBAXIN) 500 MG tablet Take 1 tablet (500 mg total) by mouth 4 (four) times daily. 120 tablet 0  . naproxen (NAPROSYN) 500 MG tablet Take 1 tablet (500 mg total) by mouth 2 (two) times daily with a meal. 60 tablet 3  . omeprazole (PRILOSEC) 40 MG capsule Take 1 capsule (40 mg total) by mouth daily. 30 capsule 3  . oxyCODONE (OXYCONTIN) 10 mg 12 hr tablet Take 1 tablet (10 mg total) by mouth every 12 (twelve) hours. 60 tablet 0  . oxyCODONE (ROXICODONE) 15 MG immediate release tablet Take 1 tablet (15 mg total) by mouth every 6 (six) hours as needed for pain. 60 tablet 0  . Vitamin D, Ergocalciferol, (DRISDOL) 1.25 MG (50000 UT) CAPS capsule Take 1 capsule (50,000 Units total) by mouth every 7 (seven) days. 5 capsule 1  . clonazePAM (KLONOPIN) 1 MG tablet Take 1 tablet (1 mg total) by mouth daily. (Patient not taking: Reported on 12/17/2019) 30 tablet 0  . ibuprofen (ADVIL) 200 MG tablet Take 800 mg by mouth every 6 (six) hours as needed for moderate pain.     No facility-administered medications prior to visit.    Allergies  Allergen Reactions  . Kiwi Extract Hives  . Morphine And Related Hives  . Paroxetine Hives    ROS Review of Systems  Constitutional: Negative.   HENT: Negative.   Eyes: Negative.   Respiratory: Negative.   Cardiovascular: Negative.   Gastrointestinal: Positive for constipation (occasional ) and nausea (occasional ).  Endocrine: Negative.   Genitourinary: Negative.   Musculoskeletal: Positive for arthralgias (generalized joint pain. ).  Skin: Negative.   Allergic/Immunologic: Negative.   Neurological: Positive for  dizziness (occasional ) and headaches (occasional).  Hematological: Negative.   Psychiatric/Behavioral: Negative.       Objective:    Physical Exam  Constitutional: She appears well-developed and well-nourished.  HENT:  Head: Normocephalic and atraumatic.  Eyes: Conjunctivae are normal.  Cardiovascular: Normal rate, regular rhythm, normal heart sounds and intact distal pulses.  Pulmonary/Chest: Effort normal and breath sounds normal.  Abdominal: Soft. Bowel sounds are normal.  Musculoskeletal:     Cervical back: Normal range of motion and neck supple.  Skin: Skin is warm and dry.  Psychiatric: She has a normal mood and affect. Her behavior is normal. Judgment and thought content normal.  Nursing  note and vitals reviewed.   BP 119/70   Pulse 78   Temp 97.7 F (36.5 C) (Axillary) Comment (Src): Patient drinking cold drink  Ht 5\' 5"  (1.651 m)   Wt 118 lb 9.6 oz (53.8 kg)   LMP 11/20/2019   SpO2 94%   BMI 19.74 kg/m  Wt Readings from Last 3 Encounters:  12/17/19 118 lb 9.6 oz (53.8 kg)  12/06/19 120 lb (54.4 kg)  08/26/19 121 lb 9.6 oz (55.2 kg)     Health Maintenance Due  Topic Date Due  . TETANUS/TDAP  08/31/2005  . PAP SMEAR-Modifier  09/01/2007  . INFLUENZA VACCINE  06/21/2019    There are no preventive care reminders to display for this patient.  No results found for: TSH Lab Results  Component Value Date   WBC 14.0 (H) 12/08/2019   HGB 8.5 (L) 12/08/2019   HCT 23.8 (L) 12/08/2019   MCV 100.0 12/08/2019   PLT 363 12/08/2019   Lab Results  Component Value Date   NA 137 12/06/2019   K 4.1 12/06/2019   CO2 25 12/06/2019   GLUCOSE 91 12/06/2019   BUN 6 12/06/2019   CREATININE 0.40 (L) 12/06/2019   BILITOT 3.5 (H) 12/06/2019   ALKPHOS 58 12/06/2019   AST 52 (H) 12/06/2019   ALT 25 12/06/2019   PROT 8.8 (H) 12/06/2019   ALBUMIN 4.6 12/06/2019   CALCIUM 9.0 12/06/2019   ANIONGAP 8 12/06/2019   No results found for: CHOL No results found for:  HDL No results found for: LDLCALC No results found for: TRIG No results found for: CHOLHDL No results found for: 12/08/2019    Assessment & Plan:   1. Hb-SS disease without crisis [facility]) She is doing well today. We will increase frequency of Oxy-IR 15 mg to every 4 hours as needed. She will continue to take pain medications as prescribed; will continue to avoid extreme heat and cold; will continue to eat a healthy diet and drink at least 64 ounces of water daily; continue stool softener as needed; will avoid colds and flu; will continue to get plenty of sleep and rest; will continue to avoid high stressful situations and remain infection free; will continue Folic Acid 1 mg daily to avoid sickle cell crisis. Continue to follow up with Hematologist as needed.  - POCT urinalysis dipstick - [facility] 11+Oxyco+Alc+Crt-Bund - oxyCODONE (ROXICODONE) 15 MG immediate release tablet; Take 1 tablet (15 mg total) by mouth every 4 (four) hours as needed for pain.  Dispense: 30 tablet; Refill: 0  2. Chronic, continuous use of opioids - [id number] 11+Oxyco+Alc+Crt-Bund - oxyCODONE (ROXICODONE) 15 MG immediate release tablet; Take 1 tablet (15 mg total) by mouth every 4 (four) hours as needed for pain.  Dispense: 30 tablet; Refill: 0  3. Other chronic pain - oxyCODONE (ROXICODONE) 15 MG immediate release tablet; Take 1 tablet (15 mg total) by mouth every 4 (four) hours as needed for pain.  Dispense: 30 tablet; Refill: 0  4. Medication management We have increased frequency of pain medication for better management of pain at home. Monitor.   5. Vitamin D deficiency - Vitamin D, Ergocalciferol, (DRISDOL) 1.25 MG (50000 UNIT) CAPS capsule; Take 1 capsule (50,000 Units total) by mouth every 7 (seven) days.  Dispense: 5 capsule; Refill: 6  6. Follow up She will follow up in 2 months.   Meds ordered this encounter  Medications  . Vitamin D, Ergocalciferol, (DRISDOL) 1.25 MG (50000 UNIT) CAPS capsule    Sig: Take 1  capsule (50,000 Units total) by mouth every 7 (seven) days.    Dispense:  5 capsule    Refill:  6  . oxyCODONE (ROXICODONE) 15 MG immediate release tablet    Sig: Take 1 tablet (15 mg total) by mouth every 4 (four) hours as needed for pain.    Dispense:  30 tablet    Refill:  0    Orders Placed This Encounter  Procedures  . [id number] 11+Oxyco+Alc+Crt-Bund  . POCT urinalysis dipstick   Raliegh Ip,  MSN, FNP-BC [facility] Patient Care [facility] [address] [place], [place] [address] [phone] fax  Problem List Items Addressed This Visit      Other   Chronic, continuous use of opioids   Relevant Medications   oxyCODONE (ROXICODONE) 15 MG immediate release tablet   Other Relevant Orders   [id number] 11+Oxyco+Alc+Crt-Bund   Sickle cell anemia (HCC) - Primary   Relevant Medications   oxyCODONE (ROXICODONE) 15 MG immediate release tablet   Other Relevant Orders   POCT urinalysis dipstick (Completed)   [id number] 11+Oxyco+Alc+Crt-Bund    Other Visit Diagnoses    Other chronic pain       Relevant Medications   oxyCODONE (ROXICODONE) 15 MG immediate release tablet   Medication management       Vitamin D deficiency       Relevant Medications   Vitamin D, Ergocalciferol, (DRISDOL) 1.25 MG (50000 UNIT) CAPS capsule   Follow up          Meds ordered this encounter  Medications  . Vitamin D, Ergocalciferol, (DRISDOL) 1.25 MG (50000 UNIT) CAPS capsule    Sig: Take 1 capsule (50,000 Units total) by mouth every 7 (seven) days.    Dispense:  5 capsule    Refill:  6  . oxyCODONE (ROXICODONE) 15 MG immediate release tablet    Sig: Take 1 tablet (15 mg total) by mouth every 4 (four) hours as needed for pain.    Dispense:  30 tablet    Refill:  0    Follow-up: Return in about 2 months (around 02/14/2020).    [name], FNP

## 2019-12-19 ENCOUNTER — Ambulatory Visit: Payer: Medicare Other | Admitting: Family Medicine

## 2019-12-19 ENCOUNTER — Telehealth: Payer: Self-pay | Admitting: Family Medicine

## 2019-12-19 NOTE — Addendum Note (Signed)
Addended by: [name] on: 12/19/2019 12:15 PM   Modules accepted: Orders

## 2019-12-22 ENCOUNTER — Telehealth: Payer: Self-pay | Admitting: Family Medicine

## 2019-12-22 ENCOUNTER — Other Ambulatory Visit: Payer: Self-pay | Admitting: Family Medicine

## 2019-12-22 NOTE — Telephone Encounter (Signed)
Done

## 2019-12-22 NOTE — Telephone Encounter (Signed)
Patient called about meds. Received less than expected for oxy 15 fast release. She Received 30

## 2019-12-23 ENCOUNTER — Other Ambulatory Visit: Payer: Self-pay | Admitting: Family Medicine

## 2019-12-23 ENCOUNTER — Telehealth: Payer: Self-pay | Admitting: Family Medicine

## 2019-12-23 DIAGNOSIS — G8929 Other chronic pain: Secondary | ICD-10-CM

## 2019-12-23 DIAGNOSIS — F1190 Opioid use, unspecified, uncomplicated: Secondary | ICD-10-CM

## 2019-12-23 DIAGNOSIS — D571 Sickle-cell disease without crisis: Secondary | ICD-10-CM

## 2019-12-23 LAB — DRUG SCREEN 764883 11+OXYCO+ALC+CRT-BUND
Amphetamines, Urine: NEGATIVE ng/mL
BENZODIAZ UR QL: NEGATIVE ng/mL
Barbiturate: NEGATIVE ng/mL
Cocaine (Metabolite): NEGATIVE ng/mL
Creatinine: 98.8 mg/dL (ref 20.0–300.0)
Ethanol: NEGATIVE %
Meperidine: NEGATIVE ng/mL
Methadone Screen, Urine: NEGATIVE ng/mL
OPIATE SCREEN URINE: NEGATIVE ng/mL
Phencyclidine: NEGATIVE ng/mL
Propoxyphene: NEGATIVE ng/mL
Tramadol: NEGATIVE ng/mL
pH, Urine: 7.1 (ref 4.5–8.9)

## 2019-12-23 LAB — OXYCODONE/OXYMORPHONE, CONFIRM
OXYCODONE/OXYMORPH: POSITIVE — AB
OXYCODONE: NEGATIVE
OXYMORPHONE (GC/MS): 2001 ng/mL
OXYMORPHONE: POSITIVE — AB

## 2019-12-23 LAB — CANNABINOID CONFIRMATION, UR
CANNABINOIDS: POSITIVE — AB
Carboxy THC GC/MS Conf: 109 ng/mL

## 2019-12-23 MED ORDER — OXYCODONE HCL 15 MG PO TABS: 15.0000 mg | ORAL_TABLET | ORAL | 0 refills | Status: DC | PRN

## 2019-12-23 NOTE — Telephone Encounter (Signed)
Patient has requested a referral to [facility]. She was seen in the ED for leg numbness

## 2019-12-23 NOTE — Telephone Encounter (Signed)
Sent to [facility] 

## 2019-12-25 ENCOUNTER — Telehealth: Payer: Self-pay | Admitting: Family Medicine

## 2019-12-25 ENCOUNTER — Telehealth (HOSPITAL_COMMUNITY): Payer: Self-pay | Admitting: General Practice

## 2019-12-25 NOTE — Telephone Encounter (Signed)
Patient called, complained of pain in the back and legs rated at 6/10. Patient endorsed swollen fingers and swollen face. Last took 10 mg of Oxycontin and 15 mg of Oxycodone at 13:00 today. Since patient called late today and also for the swollen face and fingers, patient told to go to the ER. Patient notified, verbalized understanding. Provider notified.

## 2019-12-25 NOTE — Telephone Encounter (Signed)
Pt called and said Sickle cell is acting up. Please call pt back.

## 2020-01-05 ENCOUNTER — Telehealth: Payer: Self-pay | Admitting: Family Medicine

## 2020-01-06 ENCOUNTER — Telehealth: Payer: Self-pay | Admitting: Family Medicine

## 2020-01-06 ENCOUNTER — Other Ambulatory Visit: Payer: Self-pay | Admitting: Family Medicine

## 2020-01-06 DIAGNOSIS — D571 Sickle-cell disease without crisis: Secondary | ICD-10-CM

## 2020-01-06 DIAGNOSIS — F1190 Opioid use, unspecified, uncomplicated: Secondary | ICD-10-CM

## 2020-01-06 DIAGNOSIS — G8929 Other chronic pain: Secondary | ICD-10-CM

## 2020-01-06 MED ORDER — OXYCODONE HCL 15 MG PO TABS: 15.0000 mg | ORAL_TABLET | ORAL | 0 refills | Status: DC | PRN

## 2020-01-06 MED ORDER — OXYCODONE HCL ER 10 MG PO T12A: 10.0000 mg | EXTENDED_RELEASE_TABLET | Freq: Two times a day (BID) | ORAL | 0 refills | Status: DC

## 2020-01-06 NOTE — Telephone Encounter (Signed)
Pt called requesting a refill on her oxycodone 10 and 15 mg. Pt asked when submitting prescriptions to please double check to make sure the quantity is the correct amount.

## 2020-01-07 NOTE — Telephone Encounter (Signed)
Done

## 2020-01-15 ENCOUNTER — Telehealth: Payer: Self-pay

## 2020-01-16 ENCOUNTER — Telehealth: Payer: Self-pay | Admitting: Family Medicine

## 2020-01-18 ENCOUNTER — Other Ambulatory Visit: Payer: Self-pay | Admitting: Family Medicine

## 2020-01-18 DIAGNOSIS — M25551 Pain in right hip: Secondary | ICD-10-CM

## 2020-01-18 DIAGNOSIS — D571 Sickle-cell disease without crisis: Secondary | ICD-10-CM

## 2020-01-18 DIAGNOSIS — G8929 Other chronic pain: Secondary | ICD-10-CM

## 2020-01-18 DIAGNOSIS — F1190 Opioid use, unspecified, uncomplicated: Secondary | ICD-10-CM

## 2020-01-18 MED ORDER — OXYCODONE HCL 15 MG PO TABS: 15.0000 mg | ORAL_TABLET | ORAL | 0 refills | Status: DC | PRN

## 2020-01-19 NOTE — Telephone Encounter (Signed)
done

## 2020-01-20 NOTE — Telephone Encounter (Signed)
done

## 2020-02-02 ENCOUNTER — Telehealth: Payer: Self-pay | Admitting: Family Medicine

## 2020-02-04 ENCOUNTER — Other Ambulatory Visit: Payer: Self-pay | Admitting: Family Medicine

## 2020-02-04 DIAGNOSIS — F1190 Opioid use, unspecified, uncomplicated: Secondary | ICD-10-CM

## 2020-02-04 DIAGNOSIS — D571 Sickle-cell disease without crisis: Secondary | ICD-10-CM

## 2020-02-04 DIAGNOSIS — G8929 Other chronic pain: Secondary | ICD-10-CM

## 2020-02-04 MED ORDER — OXYCODONE HCL 15 MG PO TABS: 15.0000 mg | ORAL_TABLET | ORAL | 0 refills | Status: DC | PRN

## 2020-02-04 MED ORDER — OXYCODONE HCL ER 10 MG PO T12A: 10.0000 mg | EXTENDED_RELEASE_TABLET | Freq: Two times a day (BID) | ORAL | 0 refills | Status: DC

## 2020-02-06 ENCOUNTER — Other Ambulatory Visit: Payer: Self-pay | Admitting: Family Medicine

## 2020-02-06 ENCOUNTER — Telehealth: Payer: Self-pay | Admitting: Family Medicine

## 2020-02-06 DIAGNOSIS — F1190 Opioid use, unspecified, uncomplicated: Secondary | ICD-10-CM

## 2020-02-06 DIAGNOSIS — G8929 Other chronic pain: Secondary | ICD-10-CM

## 2020-02-06 DIAGNOSIS — D571 Sickle-cell disease without crisis: Secondary | ICD-10-CM

## 2020-02-06 MED ORDER — OXYCODONE HCL 15 MG PO TABS: 15.0000 mg | ORAL_TABLET | ORAL | 0 refills | Status: DC | PRN

## 2020-02-06 MED ORDER — OXYCODONE HCL ER 10 MG PO T12A: 10.0000 mg | EXTENDED_RELEASE_TABLET | Freq: Two times a day (BID) | ORAL | 0 refills | Status: DC

## 2020-02-06 NOTE — Telephone Encounter (Signed)
Please call pt pharmacy in [place] regarding oxycodone. They need more information. Once complete, pt would like a call back.

## 2020-02-09 NOTE — Telephone Encounter (Signed)
Done

## 2020-02-11 ENCOUNTER — Other Ambulatory Visit: Payer: Self-pay

## 2020-02-11 ENCOUNTER — Ambulatory Visit (INDEPENDENT_AMBULATORY_CARE_PROVIDER_SITE_OTHER): Payer: Medicare Other | Admitting: Family Medicine

## 2020-02-11 ENCOUNTER — Encounter: Payer: Self-pay | Admitting: Family Medicine

## 2020-02-11 VITALS — BP 123/72 | HR 100 | Temp 98.6°F | Ht 65.0 in | Wt 124.2 lb

## 2020-02-11 DIAGNOSIS — D571 Sickle-cell disease without crisis: Secondary | ICD-10-CM

## 2020-02-11 DIAGNOSIS — G8929 Other chronic pain: Secondary | ICD-10-CM

## 2020-02-11 DIAGNOSIS — F419 Anxiety disorder, unspecified: Secondary | ICD-10-CM | POA: Diagnosis not present

## 2020-02-11 DIAGNOSIS — Z09 Encounter for follow-up examination after completed treatment for conditions other than malignant neoplasm: Secondary | ICD-10-CM

## 2020-02-11 DIAGNOSIS — F1190 Opioid use, unspecified, uncomplicated: Secondary | ICD-10-CM

## 2020-02-11 LAB — POCT URINALYSIS DIPSTICK
Blood, UA: NEGATIVE
Glucose, UA: NEGATIVE
Ketones, UA: NEGATIVE
Leukocytes, UA: NEGATIVE
Nitrite, UA: NEGATIVE
Protein, UA: NEGATIVE
Spec Grav, UA: 1.02 (ref 1.010–1.025)
Urobilinogen, UA: >=8 E.U./dL — AB
pH, UA: 7 (ref 5.0–8.0)

## 2020-02-11 NOTE — Progress Notes (Signed)
[facility]    Established Patient Office Visit  Subjective:  Patient ID: Kara [name], female    DOB: [birth year]  Age: 34 y.o. MRN: [id number]  CC:  Chief Complaint  Patient presents with  . Sickle Cell Anemia    Follow up  . Groin Pain    right side  . Facial Swelling    hand, feet  . Numbness    both hands    HPI Kara [name] is a 34 year old female who presents for Follow Up today.   Past Medical History:  Diagnosis Date  . Arthritis   . Chronic migraine   . GERD (gastroesophageal reflux disease)   . Hx of cholecystectomy 2015  . Pulmonary hypertension (HCC)   . Sickle cell anemia (HCC)   . Tendinitis    left elbow   Current Status: Since her last office visit, she is doing well with no complaints. She states that she has pain in her hands, feet, and legs. She rates her pain today at 5/10. She has not had a hospital visit for Sickle Cell Crisis since 12/08/2019 where she was treated and discharged the same day. She is currently taking all medications as prescribed and staying well hydrated. She reports occasional nausea, constipation, dizziness and headaches. She has recently returned from family in [place]. She denies visual changes, chest pain, cough, shortness of breath, heart palpitations, and falls. She has occasional headaches and dizziness with position changes. Denies severe headaches, confusion, seizures, double vision, and blurred vision, nausea and vomiting. She denies fevers, chills, fatigue, recent infections, weight loss, and night sweats. No reports of GI problems such as nausea, vomiting, diarrhea, and constipation. She has no reports of blood in stools, dysuria and hematuria. No depression or anxiety, and denies suicidal ideations, homicidal ideations, or auditory hallucinations. She is taking all medications as prescribed.   Past Surgical History:  Procedure Laterality Date  . DILATION AND CURETTAGE OF  UTERUS    . GALLBLADDER SURGERY      Family History  Adopted: Yes    Social History   Socioeconomic History  . Marital status: Single    Spouse name: Not on file  . Number of children: Not on file  . Years of education: Not on file  . Highest education level: Not on file  Occupational History  . Not on file  Tobacco Use  . Smoking status: Current Every Day Smoker  . Smokeless tobacco: Never Used  Substance and Sexual Activity  . Alcohol use: No  . Drug use: Yes    Types: Marijuana  . Sexual activity: Yes    Birth control/protection: None  Other Topics Concern  . Not on file  Social History Narrative  . Not on file   Social Determinants of Health   Financial Resource Strain:   . Difficulty of Paying Living Expenses:   Food Insecurity:   . Worried About Programme researcher, broadcasting/film/video in the Last Year:   . Barista in the Last Year:   Transportation Needs:   . Freight forwarder (Medical):   Marland Kitchen Lack of Transportation (Non-Medical):   Physical Activity:   . Days of Exercise per Week:   . Minutes of Exercise per Session:   Stress:   . Feeling of Stress :   Social Connections:   . Frequency of Communication with Friends and Family:   . Frequency of Social Gatherings with Friends and Family:   .  Attends Religious Services:   . Active Member of Clubs or Organizations:   . Attends Archivist Meetings:   Marland Kitchen Marital Status:   Intimate Partner Violence:   . Fear of Current or Ex-Partner:   . Emotionally Abused:   Marland Kitchen Physically Abused:   . Sexually Abused:     Outpatient Medications Prior to Visit  Medication Sig Dispense Refill  . butalbital-acetaminophen-caffeine (FIORICET) 50-325-40 MG tablet Take 1 tablet by mouth every 6 (six) hours as needed for headache. 14 tablet 0  . clonazePAM (KLONOPIN) 1 MG tablet Take 1 tablet (1 mg total) by mouth daily. (Patient not taking: Reported on 12/17/2019) 30 tablet 0  . Ensure Plus (ENSURE PLUS) LIQD Take 237 mLs by  mouth 3 (three) times daily between meals. 237 mL 6  . fluticasone (FLONASE) 50 MCG/ACT nasal spray Place 2 sprays into both nostrils daily. 16 g 0  . folic acid (FOLVITE) 1 MG tablet Take 1 tablet (1 mg total) by mouth daily. 30 tablet 3  . ibuprofen (ADVIL) 200 MG tablet Take 800 mg by mouth every 6 (six) hours as needed for moderate pain.    . methocarbamol (ROBAXIN) 500 MG tablet Take 1 tablet (500 mg total) by mouth 4 (four) times daily. 120 tablet 0  . naproxen (NAPROSYN) 500 MG tablet Take 1 tablet (500 mg total) by mouth 2 (two) times daily with a meal. 60 tablet 3  . omeprazole (PRILOSEC) 40 MG capsule Take 1 capsule (40 mg total) by mouth daily. 30 capsule 3  . oxyCODONE (OXYCONTIN) 10 mg 12 hr tablet Take 1 tablet (10 mg total) by mouth every 12 (twelve) hours. 'For Non-Acute Pain' 60 tablet 0  . oxyCODONE (ROXICODONE) 15 MG immediate release tablet Take 1 tablet (15 mg total) by mouth every 4 (four) hours as needed for up to 15 days ('For Non-Acute Pain'). 90 tablet 0  . Vitamin D, Ergocalciferol, (DRISDOL) 1.25 MG (50000 UNIT) CAPS capsule Take 1 capsule (50,000 Units total) by mouth every 7 (seven) days. (Patient not taking: Reported on 12/18/2019) 5 capsule 6   No facility-administered medications prior to visit.    Allergies  Allergen Reactions  . Kiwi Extract Hives  . Morphine And Related Hives  . Paroxetine Hives    ROS Review of Systems  Constitutional: Negative.   HENT: Negative.   Eyes: Negative.   Respiratory: Negative.   Cardiovascular: Negative.   Gastrointestinal: Positive for constipation (occasional ) and nausea (occasional ).  Endocrine: Negative.   Genitourinary: Negative.   Musculoskeletal: Positive for arthralgias (generalized joint pain).  Skin: Negative.   Allergic/Immunologic: Negative.   Neurological: Positive for dizziness (occasional ) and headaches (occasional ).  Hematological: Negative.   Psychiatric/Behavioral: Negative.     Objective:     Physical Exam  Constitutional: She is oriented to person, place, and time. She appears well-developed and well-nourished.  HENT:  Head: Normocephalic and atraumatic.  Eyes: Conjunctivae are normal.  Cardiovascular: Normal rate, regular rhythm, normal heart sounds and intact distal pulses.  Pulmonary/Chest: Effort normal and breath sounds normal.  Abdominal: Soft. Bowel sounds are normal.  Musculoskeletal:        General: Normal range of motion.     Cervical back: Normal range of motion and neck supple.  Neurological: She is alert and oriented to person, place, and time. She has normal reflexes.  Skin: Skin is warm and dry.  Psychiatric: She has a normal mood and affect. Her behavior is normal. Judgment and thought  content normal.  Nursing note and vitals reviewed.   BP 123/72   Pulse 100   Temp 98.6 F (37 C) (Oral)   Ht 5\' 5"  (1.651 m)   Wt 124 lb 3.2 oz (56.3 kg)   LMP 02/02/2020   SpO2 97%   BMI 20.67 kg/m  Wt Readings from Last 3 Encounters:  02/11/20 124 lb 3.2 oz (56.3 kg)  12/17/19 118 lb 9.6 oz (53.8 kg)  12/06/19 120 lb (54.4 kg)     Health Maintenance Due  Topic Date Due  . TETANUS/TDAP  Never done  . PAP SMEAR-Modifier  Never done  . INFLUENZA VACCINE  06/21/2019    There are no preventive care reminders to display for this patient.  No results found for: TSH Lab Results  Component Value Date   WBC 14.0 (H) 12/08/2019   HGB 8.5 (L) 12/08/2019   HCT 23.8 (L) 12/08/2019   MCV 100.0 12/08/2019   PLT 363 12/08/2019   Lab Results  Component Value Date   NA 137 12/06/2019   K 4.1 12/06/2019   CO2 25 12/06/2019   GLUCOSE 91 12/06/2019   BUN 6 12/06/2019   CREATININE 0.40 (L) 12/06/2019   BILITOT 3.5 (H) 12/06/2019   ALKPHOS 58 12/06/2019   AST 52 (H) 12/06/2019   ALT 25 12/06/2019   PROT 8.8 (H) 12/06/2019   ALBUMIN 4.6 12/06/2019   CALCIUM 9.0 12/06/2019   ANIONGAP 8 12/06/2019   No results found for: CHOL No results found for: HDL No  results found for: LDLCALC No results found for: TRIG No results found for: CHOLHDL No results found for: 12/08/2019    Assessment & Plan:   1. Hb-SS disease without crisis [facility]) She is doing well today r/t her chronic pain management. She will continue to take pain medications as prescribed; will continue to avoid extreme heat and cold; will continue to eat a healthy diet and drink at least 64 ounces of water daily; continue stool softener as needed; will avoid colds and flu; will continue to get plenty of sleep and rest; will continue to avoid high stressful situations and remain infection free; will continue Folic Acid 1 mg daily to avoid sickle cell crisis. Continue to follow up with Hematologist as needed.  - POCT urinalysis dipstick  2. Other chronic pain  3. Chronic, continuous use of opioids  4. Anxiety Stable today.   5. Follow up She will follow up in 2 months.   No orders of the defined types were placed in this encounter.   Orders Placed This Encounter  Procedures  . POCT urinalysis dipstick    Referral Orders  No referral(s) requested today    [facility],  MSN, FNP-BC [facility] [address] [place], [place] [phone] fax  Problem List Items Addressed This Visit      Other   Anxiety   Chronic, continuous use of opioids   Sickle cell anemia (HCC) - Primary   Relevant Orders   POCT urinalysis dipstick (Completed)    Other Visit Diagnoses    Other chronic pain       Follow up          No orders of the defined types were placed in this encounter.   Follow-up: No follow-ups on file.    [phone], FNP

## 2020-02-18 ENCOUNTER — Telehealth: Payer: Self-pay | Admitting: Family Medicine

## 2020-02-18 ENCOUNTER — Other Ambulatory Visit: Payer: Self-pay | Admitting: Family Medicine

## 2020-02-18 DIAGNOSIS — G8929 Other chronic pain: Secondary | ICD-10-CM

## 2020-02-18 DIAGNOSIS — D571 Sickle-cell disease without crisis: Secondary | ICD-10-CM

## 2020-02-18 DIAGNOSIS — F1190 Opioid use, unspecified, uncomplicated: Secondary | ICD-10-CM

## 2020-02-18 MED ORDER — OXYCODONE HCL 15 MG PO TABS: 15.0000 mg | ORAL_TABLET | ORAL | 0 refills | Status: DC | PRN

## 2020-02-18 NOTE — Telephone Encounter (Signed)
Pt wants a refill on oxycodone 15mg 

## 2020-02-19 NOTE — Telephone Encounter (Signed)
Message left on voice mail.

## 2020-03-03 ENCOUNTER — Other Ambulatory Visit: Payer: Self-pay | Admitting: Family Medicine

## 2020-03-03 ENCOUNTER — Telehealth: Payer: Self-pay | Admitting: Family Medicine

## 2020-03-03 DIAGNOSIS — F1190 Opioid use, unspecified, uncomplicated: Secondary | ICD-10-CM

## 2020-03-03 DIAGNOSIS — G8929 Other chronic pain: Secondary | ICD-10-CM

## 2020-03-03 DIAGNOSIS — D571 Sickle-cell disease without crisis: Secondary | ICD-10-CM

## 2020-03-03 MED ORDER — OXYCODONE HCL ER 10 MG PO T12A: 10.0000 mg | EXTENDED_RELEASE_TABLET | Freq: Two times a day (BID) | ORAL | 0 refills | Status: DC

## 2020-03-03 MED ORDER — OXYCODONE HCL 15 MG PO TABS: 15.0000 mg | ORAL_TABLET | ORAL | 0 refills | Status: DC | PRN

## 2020-03-03 NOTE — Telephone Encounter (Signed)
Pt requested refill on oxycodone 10 mg (extended release) and 15mg  (immediate release)

## 2020-03-05 ENCOUNTER — Telehealth: Payer: Self-pay | Admitting: Family Medicine

## 2020-03-05 NOTE — Telephone Encounter (Signed)
Please call pt back regarding pain meds. Pharmacy states they need prior authorization and medicare sent a letter stating her meds are no longer covered due to a form not filled out by the office.

## 2020-03-09 NOTE — Telephone Encounter (Signed)
I have been working on this since the afternoon of 03/06/15. On 03/05/2020 it was in appeal. On 03/08/2020 I was told it was denied. Then I had it re-ran on 03/08/2020, now I am told it is in review.  Patient has been updated on status.   Kara [name] said she is willing to try other options for pain control. (Alternative list will be in your box).

## 2020-03-12 ENCOUNTER — Telehealth: Payer: Self-pay

## 2020-03-12 NOTE — Telephone Encounter (Signed)
Rx finally approve. Approval through 11/19/2020 for Oxycodone HCL ER 10 MG tablets (Medicare Elixir).

## 2020-03-18 ENCOUNTER — Telehealth: Payer: Self-pay | Admitting: Family Medicine

## 2020-03-18 ENCOUNTER — Other Ambulatory Visit: Payer: Self-pay | Admitting: Family Medicine

## 2020-03-18 DIAGNOSIS — G8929 Other chronic pain: Secondary | ICD-10-CM

## 2020-03-18 DIAGNOSIS — D571 Sickle-cell disease without crisis: Secondary | ICD-10-CM

## 2020-03-18 DIAGNOSIS — F1190 Opioid use, unspecified, uncomplicated: Secondary | ICD-10-CM

## 2020-03-18 MED ORDER — OXYCODONE HCL 15 MG PO TABS: 15.0000 mg | ORAL_TABLET | ORAL | 0 refills | Status: DC | PRN

## 2020-03-18 NOTE — Telephone Encounter (Signed)
Pt called in refill on oxycodone 15mg 

## 2020-04-01 ENCOUNTER — Other Ambulatory Visit: Payer: Self-pay | Admitting: Family Medicine

## 2020-04-01 ENCOUNTER — Telehealth: Payer: Self-pay | Admitting: Family Medicine

## 2020-04-01 DIAGNOSIS — D571 Sickle-cell disease without crisis: Secondary | ICD-10-CM

## 2020-04-01 DIAGNOSIS — G8929 Other chronic pain: Secondary | ICD-10-CM

## 2020-04-01 DIAGNOSIS — F1190 Opioid use, unspecified, uncomplicated: Secondary | ICD-10-CM

## 2020-04-01 MED ORDER — OXYCODONE HCL ER 10 MG PO T12A: 10.0000 mg | EXTENDED_RELEASE_TABLET | Freq: Two times a day (BID) | ORAL | 0 refills | Status: DC

## 2020-04-01 MED ORDER — OXYCODONE HCL 15 MG PO TABS: 15.0000 mg | ORAL_TABLET | ORAL | 0 refills | Status: DC | PRN

## 2020-04-01 NOTE — Telephone Encounter (Signed)
Pt has concerns about a painful abscess on her butt. Please follow up with pt.

## 2020-04-01 NOTE — Telephone Encounter (Signed)
Pt requesting refill on oxycodone 15mg .

## 2020-04-01 NOTE — Telephone Encounter (Signed)
Please give her an appointment ASAP.

## 2020-04-02 ENCOUNTER — Ambulatory Visit (INDEPENDENT_AMBULATORY_CARE_PROVIDER_SITE_OTHER): Payer: Medicare Other | Admitting: Nurse Practitioner

## 2020-04-02 ENCOUNTER — Other Ambulatory Visit: Payer: Self-pay

## 2020-04-02 ENCOUNTER — Encounter: Payer: Self-pay | Admitting: Nurse Practitioner

## 2020-04-02 VITALS — BP 107/64 | HR 68 | Temp 98.9°F | Ht 65.0 in | Wt 123.0 lb

## 2020-04-02 DIAGNOSIS — R350 Frequency of micturition: Secondary | ICD-10-CM

## 2020-04-02 DIAGNOSIS — L0231 Cutaneous abscess of buttock: Secondary | ICD-10-CM | POA: Diagnosis not present

## 2020-04-02 LAB — POCT URINALYSIS DIPSTICK
Blood, UA: NEGATIVE
Glucose, UA: NEGATIVE
Ketones, UA: NEGATIVE
Leukocytes, UA: NEGATIVE
Nitrite, UA: NEGATIVE
Protein, UA: NEGATIVE
Spec Grav, UA: 1.015 (ref 1.010–1.025)
Urobilinogen, UA: 4 E.U./dL — AB
pH, UA: 5.5 (ref 5.0–8.0)

## 2020-04-02 MED ORDER — BENZOCAINE (TOPICAL) 20 % EX OINT: 1.0000 "application " | TOPICAL_OINTMENT | Freq: Four times a day (QID) | CUTANEOUS | 2 refills | Status: DC | PRN

## 2020-04-02 MED ORDER — DOXYCYCLINE HYCLATE 100 MG PO CAPS: 100.0000 mg | ORAL_CAPSULE | Freq: Two times a day (BID) | ORAL | 0 refills | Status: AC

## 2020-04-02 NOTE — Progress Notes (Signed)
[facility] Patient [facility] [address] [place], [place]  [address] Phone:  [phone]   Fax:  [phone]   Acute Office Visit  Subjective:    Patient ID: Kara [name], female    DOB: 17-Aug-1986, 34 y.o.   MRN: [id number]  Chief Complaint  Patient presents with  . Recurrent Skin Infections    hard ,no drainage, x1 week right , painful, possible     HPI Patient is in today for skin infection. She  has a past medical history of Arthritis, Chronic migraine, GERD (gastroesophageal reflux disease), cholecystectomy (2015), Pulmonary hypertension (HCC), Sickle cell anemia (HCC), and Tendinitis.    Epidermal Cyst Patient complains of a subcutaneous nodule located over the buttock.  This has been present for 1 week.  There has been swelling and pain. 5/10.  Patient does not have a history of epidermal inclusion cysts. She has used warm compress which was not effective. She is unsure what else to do because this is her first.   Past Medical History:  Diagnosis Date  . Arthritis   . Chronic migraine   . GERD (gastroesophageal reflux disease)   . Hx of cholecystectomy 2015  . Pulmonary hypertension (HCC)   . Sickle cell anemia (HCC)   . Tendinitis    left elbow    Past Surgical History:  Procedure Laterality Date  . DILATION AND CURETTAGE OF UTERUS    . GALLBLADDER SURGERY      Family History  Adopted: Yes    Social History   Socioeconomic History  . Marital status: Single    Spouse name: Not on file  . Number of children: Not on file  . Years of education: Not on file  . Highest education level: Not on file  Occupational History  . Not on file  Tobacco Use  . Smoking status: Current Every Day Smoker  . Smokeless tobacco: Never Used  Substance and Sexual Activity  . Alcohol use: No  . Drug use: Yes    Types: Marijuana  . Sexual activity: Yes    Birth control/protection: None  Other Topics Concern  . Not on file  Social History Narrative  . Not on  file   Social Determinants of Health   Financial Resource Strain:   . Difficulty of Paying Living Expenses:   Food Insecurity:   . Worried About Programme researcher, broadcasting/film/video in the Last Year:   . Barista in the Last Year:   Transportation Needs:   . Freight forwarder (Medical):   Marland Kitchen Lack of Transportation (Non-Medical):   Physical Activity:   . Days of Exercise per Week:   . Minutes of Exercise per Session:   Stress:   . Feeling of Stress :   Social Connections:   . Frequency of Communication with Friends and Family:   . Frequency of Social Gatherings with Friends and Family:   . Attends Religious Services:   . Active Member of Clubs or Organizations:   . Attends Banker Meetings:   Marland Kitchen Marital Status:   Intimate Partner Violence:   . Fear of Current or Ex-Partner:   . Emotionally Abused:   Marland Kitchen Physically Abused:   . Sexually Abused:     Outpatient Medications Prior to Visit  Medication Sig Dispense Refill  . butalbital-acetaminophen-caffeine (FIORICET) 50-325-40 MG tablet Take 1 tablet by mouth every 6 (six) hours as needed for headache. 14 tablet 0  . clonazePAM (KLONOPIN) 1 MG tablet Take  1 tablet (1 mg total) by mouth daily. 30 tablet 0  . Ensure Plus (ENSURE PLUS) LIQD Take 237 mLs by mouth 3 (three) times daily between meals. 237 mL 6  . fluticasone (FLONASE) 50 MCG/ACT nasal spray Place 2 sprays into both nostrils daily. 16 g 0  . folic acid (FOLVITE) 1 MG tablet Take 1 tablet (1 mg total) by mouth daily. 30 tablet 3  . methocarbamol (ROBAXIN) 500 MG tablet Take 1 tablet (500 mg total) by mouth 4 (four) times daily. 120 tablet 0  . naproxen (NAPROSYN) 500 MG tablet Take 1 tablet (500 mg total) by mouth 2 (two) times daily with a meal. 60 tablet 3  . omeprazole (PRILOSEC) 40 MG capsule Take 1 capsule (40 mg total) by mouth daily. 30 capsule 3  . oxyCODONE (OXYCONTIN) 10 mg 12 hr tablet Take 1 tablet (10 mg total) by mouth every 12 (twelve) hours. 'For  Non-Acute Pain' 60 tablet 0  . oxyCODONE (ROXICODONE) 15 MG immediate release tablet Take 1 tablet (15 mg total) by mouth every 4 (four) hours as needed for up to 15 days. 90 tablet 0  . Vitamin D, Ergocalciferol, (DRISDOL) 1.25 MG (50000 UNIT) CAPS capsule Take 1 capsule (50,000 Units total) by mouth every 7 (seven) days. 5 capsule 6  . ibuprofen (ADVIL) 200 MG tablet Take 800 mg by mouth every 6 (six) hours as needed for moderate pain.     No facility-administered medications prior to visit.    Allergies  Allergen Reactions  . Kiwi Extract Hives  . Morphine And Related Hives  . Paroxetine Hives    Review of Systems     Objective:    Physical Exam Vitals reviewed.  Constitutional:      Appearance: Normal appearance. She is toxic-appearing. She is not ill-appearing or diaphoretic.     Comments: Mild discomfort  Skin:    General: Skin is warm.          Comments:  erythema, swelling and tenderness  Neurological:     Mental Status: She is alert and oriented to person, place, and time.  Psychiatric:        Mood and Affect: Mood normal.        Behavior: Behavior normal.        Thought Content: Thought content normal.        Judgment: Judgment normal.     BP 107/64 (BP Location: Left Arm, Patient Position: Sitting)   Pulse 68   Temp 98.9 F (37.2 C)   Ht 5\' 5"  (1.651 m)   Wt 123 lb (55.8 kg)   SpO2 100%   BMI 20.47 kg/m  Wt Readings from Last 3 Encounters:  04/02/20 123 lb (55.8 kg)  02/11/20 124 lb 3.2 oz (56.3 kg)  12/17/19 118 lb 9.6 oz (53.8 kg)    Health Maintenance Due  Topic Date Due  . COVID-19 Vaccine (1) Never done  . TETANUS/TDAP  Never done  . PAP SMEAR-Modifier  Never done    There are no preventive care reminders to display for this patient.   No results found for: TSH Lab Results  Component Value Date   WBC 14.0 (H) 12/08/2019   HGB 8.5 (L) 12/08/2019   HCT 23.8 (L) 12/08/2019   MCV 100.0 12/08/2019   PLT 363 12/08/2019   Lab Results    Component Value Date   NA 137 12/06/2019   K 4.1 12/06/2019   CO2 25 12/06/2019   GLUCOSE 91 12/06/2019  BUN 6 12/06/2019   CREATININE 0.40 (L) 12/06/2019   BILITOT 3.5 (H) 12/06/2019   ALKPHOS 58 12/06/2019   AST 52 (H) 12/06/2019   ALT 25 12/06/2019   PROT 8.8 (H) 12/06/2019   ALBUMIN 4.6 12/06/2019   CALCIUM 9.0 12/06/2019   ANIONGAP 8 12/06/2019   No results found for: CHOL No results found for: HDL No results found for: LDLCALC No results found for: TRIG No results found for: CHOLHDL No results found for: [id number]     Assessment & Plan:   Problem List Items Addressed This Visit    None    Visit Diagnoses    Frequency of urination    -  Primary   Relevant Orders   POCT Urinalysis Dipstick (Completed)   Abscess of right buttock           Meds ordered this encounter  Medications  . Benzocaine 20 % OINT    Sig: Apply 1 application topically 4 (four) times daily as needed.    Dispense:  28 g    Refill:  2    Order Specific Question:   Supervising Provider    Answer:   Quentin Angst [id number]  . doxycycline (VIBRAMYCIN) 100 MG capsule    Sig: Take 1 capsule (100 mg total) by mouth 2 (two) times daily for 7 days.    Dispense:  14 capsule    Refill:  0    Order Specific Question:   Supervising Provider    Answer:   Quentin Angst [[id number]]     Barbette Merino, NP

## 2020-04-02 NOTE — Patient Instructions (Signed)
Epidermal Cyst  An epidermal cyst is a small, painless lump under your skin. The cyst contains a grayish-white, bad-smelling substance (keratin). Do not try to pop or open an epidermal cyst yourself. What are the causes?  A blocked hair follicle.  A hair that curls and re-enters the skin instead of growing straight out of the skin.  A blocked pore.  Irritated skin.  An injury to the skin.  Certain conditions that are passed along from parent to child (inherited).  Human papillomavirus (HPV).  Long-term sun damage to the skin. What increases the risk?  Having acne.  Being overweight.  Being 30-40 years old. What are the signs or symptoms? These cysts are usually harmless, but they can get infected. Symptoms of infection may include:  Redness.  Inflammation.  Tenderness.  Warmth.  Fever.  A grayish-white, bad-smelling substance drains from the cyst.  Pus drains from the cyst. How is this treated? In many cases, epidermal cysts go away on their own without treatment. If a cyst becomes infected, treatment may include:  Opening and draining the cyst, done by a doctor. After draining, you may need minor surgery to remove the rest of the cyst.  Antibiotic medicine.  Shots of medicines (steroids) that help to reduce inflammation.  Surgery to remove the cyst. Surgery may be done if the cyst: ? Becomes large. ? Bothers you. ? Has a chance of turning into cancer.  Do not try to open a cyst yourself. Follow these instructions at home:  Take over-the-counter and prescription medicines only as told by your doctor.  If you were prescribed an antibiotic medicine, take it it as told by your doctor. Do not stop using the antibiotic even if you start to feel better.  Keep the area around your cyst clean and dry.  Wear loose, dry clothing.  Avoid touching your cyst.  Check your cyst every day for signs of infection. Check for: ? Redness, swelling, or pain. ? Fluid  or blood. ? Warmth. ? Pus or a bad smell.  Keep all follow-up visits as told by your doctor. This is important. How is this prevented?  Wear clean, dry, clothing.  Avoid wearing tight clothing.  Keep your skin clean and dry. Take showers or baths every day. Contact a doctor if:  Your cyst has symptoms of infection.  Your condition does not improve or gets worse.  You have a cyst that looks different from other cysts you have had.  You have a fever. Get help right away if:  Redness spreads from the cyst into the area close by. Summary  An epidermal cyst is a sac made of skin tissue.  If a cyst becomes infected, treatment may include surgery to open and drain the cyst, or to remove it.  Take over-the-counter and prescription medicines only as told by your doctor.  Contact a doctor if your condition is not improving or is getting worse.  Keep all follow-up visits as told by your doctor. This is important. This information is not intended to replace advice given to you by your health care provider. Make sure you discuss any questions you have with your health care provider. Document Revised: 02/27/2019 Document Reviewed: 08/15/2018 Elsevier Patient Education  2020 [employer]  

## 2020-04-05 ENCOUNTER — Encounter (HOSPITAL_COMMUNITY): Payer: Self-pay | Admitting: Family Medicine

## 2020-04-05 ENCOUNTER — Telehealth (HOSPITAL_COMMUNITY): Payer: Self-pay | Admitting: *Deleted

## 2020-04-05 ENCOUNTER — Inpatient Hospital Stay (HOSPITAL_COMMUNITY)
Admission: AD | Admit: 2020-04-05 | Discharge: 2020-04-05 | DRG: 812 | Discharge status: Against Medical Advice | Payer: Medicare Other | Source: Ambulatory Visit | Attending: Internal Medicine | Admitting: Internal Medicine

## 2020-04-05 ENCOUNTER — Telehealth: Payer: Self-pay | Admitting: Family Medicine

## 2020-04-05 DIAGNOSIS — F419 Anxiety disorder, unspecified: Secondary | ICD-10-CM | POA: Diagnosis not present

## 2020-04-05 DIAGNOSIS — F17200 Nicotine dependence, unspecified, uncomplicated: Secondary | ICD-10-CM | POA: Diagnosis present

## 2020-04-05 DIAGNOSIS — I272 Pulmonary hypertension, unspecified: Secondary | ICD-10-CM | POA: Diagnosis present

## 2020-04-05 DIAGNOSIS — D638 Anemia in other chronic diseases classified elsewhere: Secondary | ICD-10-CM | POA: Diagnosis present

## 2020-04-05 DIAGNOSIS — K219 Gastro-esophageal reflux disease without esophagitis: Secondary | ICD-10-CM | POA: Diagnosis present

## 2020-04-05 DIAGNOSIS — G894 Chronic pain syndrome: Secondary | ICD-10-CM

## 2020-04-05 DIAGNOSIS — D5700 Hb-SS disease with crisis, unspecified: Principal | ICD-10-CM | POA: Diagnosis present

## 2020-04-05 DIAGNOSIS — Z20822 Contact with and (suspected) exposure to covid-19: Secondary | ICD-10-CM | POA: Diagnosis present

## 2020-04-05 DIAGNOSIS — E876 Hypokalemia: Secondary | ICD-10-CM | POA: Diagnosis present

## 2020-04-05 DIAGNOSIS — Z79899 Other long term (current) drug therapy: Secondary | ICD-10-CM | POA: Diagnosis not present

## 2020-04-05 DIAGNOSIS — Z9049 Acquired absence of other specified parts of digestive tract: Secondary | ICD-10-CM

## 2020-04-05 DIAGNOSIS — D72829 Elevated white blood cell count, unspecified: Secondary | ICD-10-CM | POA: Diagnosis present

## 2020-04-05 DIAGNOSIS — F411 Generalized anxiety disorder: Secondary | ICD-10-CM | POA: Diagnosis present

## 2020-04-05 DIAGNOSIS — F1120 Opioid dependence, uncomplicated: Secondary | ICD-10-CM | POA: Diagnosis present

## 2020-04-05 LAB — CBC WITH DIFFERENTIAL/PLATELET
Abs Immature Granulocytes: 0.23 10*3/uL — ABNORMAL HIGH (ref 0.00–0.07)
Basophils Absolute: 0.1 10*3/uL (ref 0.0–0.1)
Basophils Relative: 1 %
Eosinophils Absolute: 0.5 10*3/uL (ref 0.0–0.5)
Eosinophils Relative: 3 %
HCT: 23.8 % — ABNORMAL LOW (ref 36.0–46.0)
Hemoglobin: 8.1 g/dL — ABNORMAL LOW (ref 12.0–15.0)
Immature Granulocytes: 2 %
Lymphocytes Relative: 52 %
Lymphs Abs: 7.3 10*3/uL — ABNORMAL HIGH (ref 0.7–4.0)
MCH: 34.3 pg — ABNORMAL HIGH (ref 26.0–34.0)
MCHC: 34 g/dL (ref 30.0–36.0)
MCV: 100.8 fL — ABNORMAL HIGH (ref 80.0–100.0)
Monocytes Absolute: 0.9 10*3/uL (ref 0.1–1.0)
Monocytes Relative: 6 %
Neutro Abs: 5.1 10*3/uL (ref 1.7–7.7)
Neutrophils Relative %: 36 %
Platelets: 449 10*3/uL — ABNORMAL HIGH (ref 150–400)
RBC: 2.36 MIL/uL — ABNORMAL LOW (ref 3.87–5.11)
RDW: 20.5 % — ABNORMAL HIGH (ref 11.5–15.5)
WBC: 14.1 10*3/uL — ABNORMAL HIGH (ref 4.0–10.5)
nRBC: 0.9 % — ABNORMAL HIGH (ref 0.0–0.2)

## 2020-04-05 LAB — COMPREHENSIVE METABOLIC PANEL
ALT: 13 U/L (ref 0–44)
AST: 27 U/L (ref 15–41)
Albumin: 3.9 g/dL (ref 3.5–5.0)
Alkaline Phosphatase: 53 U/L (ref 38–126)
Anion gap: 10 (ref 5–15)
BUN: <5 mg/dL — ABNORMAL LOW (ref 6–20)
CO2: 26 mmol/L (ref 22–32)
Calcium: 8.9 mg/dL (ref 8.9–10.3)
Chloride: 104 mmol/L (ref 98–111)
Creatinine, Ser: 0.43 mg/dL — ABNORMAL LOW (ref 0.44–1.00)
GFR calc Af Amer: >60 mL/min (ref >60)
GFR calc non Af Amer: >60 mL/min (ref >60)
Glucose, Bld: 97 mg/dL (ref 70–99)
Potassium: 3.3 mmol/L — ABNORMAL LOW (ref 3.5–5.1)
Sodium: 140 mmol/L (ref 135–145)
Total Bilirubin: 3.7 mg/dL — ABNORMAL HIGH (ref 0.3–1.2)
Total Protein: 7.7 g/dL (ref 6.5–8.1)

## 2020-04-05 LAB — URINALYSIS, ROUTINE W REFLEX MICROSCOPIC
Bacteria, UA: NONE SEEN
Bilirubin Urine: NEGATIVE
Glucose, UA: NEGATIVE mg/dL
Ketones, ur: NEGATIVE mg/dL
Leukocytes,Ua: NEGATIVE
Nitrite: NEGATIVE
Protein, ur: NEGATIVE mg/dL
Specific Gravity, Urine: 1.01 (ref 1.005–1.030)
pH: 6 (ref 5.0–8.0)

## 2020-04-05 LAB — PREGNANCY, URINE: Preg Test, Ur: NEGATIVE

## 2020-04-05 LAB — RETICULOCYTES
Immature Retic Fract: 28.5 % — ABNORMAL HIGH (ref 2.3–15.9)
RBC.: 2.35 MIL/uL — ABNORMAL LOW (ref 3.87–5.11)
Retic Count, Absolute: 385 10*3/uL — ABNORMAL HIGH (ref 19.0–186.0)
Retic Ct Pct: 15.4 % — ABNORMAL HIGH (ref 0.4–3.1)

## 2020-04-05 MED ORDER — DEXTROSE-NACL 5-0.45 % IV SOLN: INTRAVENOUS | Status: DC

## 2020-04-05 MED ORDER — SODIUM CHLORIDE 0.9 % IV SOLN
25.0000 mg | INTRAVENOUS | Status: DC | PRN
  Filled 2020-04-05: qty 0.5

## 2020-04-05 MED ORDER — DIPHENHYDRAMINE HCL 25 MG PO CAPS: 25.0000 mg | ORAL_CAPSULE | ORAL | Status: DC | PRN

## 2020-04-05 MED ORDER — HYDROMORPHONE 1 MG/ML IV SOLN
INTRAVENOUS | Status: DC
  Administered 2020-04-05: 30 mg via INTRAVENOUS
  Administered 2020-04-05: 7.5 mg via INTRAVENOUS
  Filled 2020-04-05: qty 30

## 2020-04-05 MED ORDER — ONDANSETRON HCL 4 MG/2ML IJ SOLN: 4.0000 mg | Freq: Four times a day (QID) | INTRAMUSCULAR | Status: DC | PRN

## 2020-04-05 MED ORDER — SODIUM CHLORIDE 0.9% FLUSH: 9.0000 mL | INTRAVENOUS | Status: DC | PRN

## 2020-04-05 MED ORDER — POLYETHYLENE GLYCOL 3350 17 G PO PACK: 17.0000 g | PACK | Freq: Every day | ORAL | Status: DC | PRN

## 2020-04-05 MED ORDER — SENNOSIDES-DOCUSATE SODIUM 8.6-50 MG PO TABS: 1.0000 | ORAL_TABLET | Freq: Two times a day (BID) | ORAL | Status: DC

## 2020-04-05 MED ORDER — NALOXONE HCL 0.4 MG/ML IJ SOLN: 0.4000 mg | INTRAMUSCULAR | Status: DC | PRN

## 2020-04-05 MED ORDER — KETOROLAC TROMETHAMINE 30 MG/ML IJ SOLN
15.0000 mg | Freq: Once | INTRAMUSCULAR | Status: AC
  Administered 2020-04-05: 15 mg via INTRAVENOUS
  Filled 2020-04-05: qty 1

## 2020-04-05 NOTE — Progress Notes (Signed)
Dilaudid PCA syringe 9ml wasted with Anner Crete RN in steri cycle.

## 2020-04-05 NOTE — H&P (Signed)
H&P  Patient Demographics:  Kara [name], is a 34 y.o. female  MRN: [id number]   DOB - October 13, 1986  Admit Date - 04/05/2020  Outpatient Primary MD for the patient is [name], FNP  No chief complaint on file.     HPI:   Kara [name]  is a 34 y.o. female  with a medical history significant for sickle cell disease, chronic pain syndrome, opiate dependence and tolerance, GERD, and pulmonary hypertension presents complaining of generalized pain that is consistent with her typical pain crisis.  Patient says that pain is primarily to low back, abdomen, and lower extremities.  She attributes pain crisis to increased activity, she started experiencing pain after going fishing on yesterday.  Pain intensity is 9/10 characterized as constant and throbbing.  Patient last had OxyContin this a.m. without sustained relief.  She denies sick contacts, recent travel, or exposure to COVID-19.  Patient also denies fever, chills, chest pain, shortness of breath, urinary symptoms, nausea, vomiting, or diarrhea.  Patient mentioned that she is currently on doxycycline for a boil to her right buttocks.  She is on day 3 of antibiotics.  Sickle cell day infusion center course: Vital signs show: BP 102/62 (BP Location: Left Arm)   Pulse 64   Temp 98.4 F (36.9 C) (Oral)   Resp 12   LMP 03/06/2020   SpO2 99%   Hemoglobin 8.1, consistent with patient's baseline. WBCs 14.1 and platelets 449. Potassium 3.3 and total bilirubin elevated at 3.7. COVID 19 test pending.  Pain persists despite IV dilaudid PCA, IV fluids, IV Toradol and Tylenol. Patient will be admitted to [facility] for further management of sickle cell pain crisis.     Review of systems:  In addition to the HPI above, patient reports Review of Systems  Constitutional: Negative for chills and fever.  HENT: Negative.   Eyes: Negative.   Respiratory: Negative.   Gastrointestinal: Negative.   Genitourinary: Negative.   Musculoskeletal: Positive  for back pain and joint pain.  Skin: Negative.   Neurological: Negative.   Psychiatric/Behavioral: Negative.    A full 10 point Review of Systems was done, except as stated above, all other Review of Systems were negative.  With Past History of the following :   Past Medical History:  Diagnosis Date  . Arthritis   . Chronic migraine   . GERD (gastroesophageal reflux disease)   . Hx of cholecystectomy 2015  . Pulmonary hypertension (HCC)   . Sickle cell anemia (HCC)   . Tendinitis    left elbow      Past Surgical History:  Procedure Laterality Date  . DILATION AND CURETTAGE OF UTERUS    . GALLBLADDER SURGERY       Social History:   Social History   Tobacco Use  . Smoking status: Current Every Day Smoker  . Smokeless tobacco: Never Used  Substance Use Topics  . Alcohol use: No     Lives - At home   Family History :   Family History  Adopted: Yes     Home Medications:   Prior to Admission medications   Medication Sig Start Date End Date Taking? Authorizing Provider  doxycycline (VIBRAMYCIN) 100 MG capsule Take 1 capsule (100 mg total) by mouth 2 (two) times daily for 7 days. 04/02/20 04/09/20 Yes Barbette Merino, NP  omeprazole (PRILOSEC) 40 MG capsule Take 1 capsule (40 mg total) by mouth daily. 08/26/19  Yes [name], FNP  oxyCODONE (OXYCONTIN) 10 mg 12 hr  tablet Take 1 tablet (10 mg total) by mouth every 12 (twelve) hours. 'For Non-Acute Pain' 04/01/20 05/01/20 Yes [name], FNP  oxyCODONE (ROXICODONE) 15 MG immediate release tablet Take 1 tablet (15 mg total) by mouth every 4 (four) hours as needed for up to 15 days. 04/02/20 04/17/20 Yes [name], FNP  Benzocaine 20 % OINT Apply 1 application topically 4 (four) times daily as needed. 04/02/20   Barbette Merino, NP  butalbital-acetaminophen-caffeine (FIORICET) [phone] MG tablet Take 1 tablet by mouth every 6 (six) hours as needed for headache. 07/06/19   [name], MD  clonazePAM  (KLONOPIN) 1 MG tablet Take 1 tablet (1 mg total) by mouth daily. 07/14/19   [name], FNP  Ensure Plus (ENSURE PLUS) LIQD Take 237 mLs by mouth 3 (three) times daily between meals. 08/26/19   [name], FNP  fluticasone (FLONASE) 50 MCG/ACT nasal spray Place 2 sprays into both nostrils daily. 02/20/19   Menshew, Charlesetta Ivory, PA-C  folic acid (FOLVITE) 1 MG tablet Take 1 tablet (1 mg total) by mouth daily. 07/06/19   [name], MD  ibuprofen (ADVIL) 200 MG tablet Take 800 mg by mouth every 6 (six) hours as needed for moderate pain.    [provider]  methocarbamol (ROBAXIN) 500 MG tablet Take 1 tablet (500 mg total) by mouth 4 (four) times daily. 07/06/19   [name], MD  naproxen (NAPROSYN) 500 MG tablet Take 1 tablet (500 mg total) by mouth 2 (two) times daily with a meal. 07/14/19   [name], FNP  Vitamin D, Ergocalciferol, (DRISDOL) 1.25 MG (50000 UNIT) CAPS capsule Take 1 capsule (50,000 Units total) by mouth every 7 (seven) days. 12/17/19   [name], FNP     Allergies:   Allergies  Allergen Reactions  . Kiwi Extract Hives  . Morphine And Related Hives  . Paroxetine Hives     Physical Exam:   Vitals:   Vitals:   04/05/20 1411 04/05/20 1700  BP: 112/63 102/62  Pulse: 73 64  Resp: 15 12  Temp:    SpO2: 98% 99%    Physical Exam: Constitutional: Patient appears well-developed and well-nourished. Not in obvious distress. HENT: Normocephalic, atraumatic, External right and left ear normal. Oropharynx is clear and moist.  Eyes: Conjunctivae and EOM are normal. PERRLA, no scleral icterus. Neck: Normal ROM. Neck supple. No JVD. No tracheal deviation. No thyromegaly. CVS: RRR, S1/S2 +, no murmurs, no gallops, no carotid bruit.  Pulmonary: Effort and breath sounds normal, no stridor, rhonchi, wheezes, rales.  Abdominal: Soft. BS +, no distension, tenderness, rebound or guarding.  Musculoskeletal: Normal range of motion. No  edema and no tenderness.  Lymphadenopathy: No lymphadenopathy noted, cervical, inguinal or axillary Neuro: Alert. Normal reflexes, muscle tone coordination. No cranial nerve deficit. Skin: Skin is warm and dry. No rash noted. Not diaphoretic. No erythema. No pallor. Psychiatric: Normal mood and affect. Behavior, judgment, thought content normal.   Data Review:   CBC Recent Labs  Lab 04/05/20 1301  WBC 14.1*  HGB 8.1*  HCT 23.8*  PLT 449*  MCV 100.8*  MCH 34.3*  MCHC 34.0  RDW 20.5*  LYMPHSABS 7.3*  MONOABS 0.9  EOSABS 0.5  BASOSABS 0.1   ------------------------------------------------------------------------------------------------------------------  Chemistries  Recent Labs  Lab 04/05/20 1301  NA 140  K 3.3*  CL 104  CO2 26  GLUCOSE 97  BUN <5*  CREATININE 0.43*  CALCIUM 8.9  AST  27  ALT 13  ALKPHOS 53  BILITOT 3.7*   ------------------------------------------------------------------------------------------------------------------ estimated creatinine clearance is 88.1 mL/min (A) (by C-G formula based on SCr of 0.43 mg/dL (L)). ------------------------------------------------------------------------------------------------------------------ No results for input(s): TSH, T4TOTAL, T3FREE, THYROIDAB in the last 72 hours.  Invalid input(s): FREET3  Coagulation profile No results for input(s): INR, PROTIME in the last 168 hours. ------------------------------------------------------------------------------------------------------------------- No results for input(s): DDIMER in the last 72 hours. -------------------------------------------------------------------------------------------------------------------  Cardiac Enzymes No results for input(s): CKMB, TROPONINI, MYOGLOBIN in the last 168 hours.  Invalid input(s): CK ------------------------------------------------------------------------------------------------------------------ No results found for:  BNP  ---------------------------------------------------------------------------------------------------------------  Urinalysis    Component Value Date/Time   COLORURINE YELLOW 04/05/2020 1400   APPEARANCEUR CLEAR 04/05/2020 1400   LABSPEC 1.010 04/05/2020 1400   PHURINE 6.0 04/05/2020 1400   GLUCOSEU NEGATIVE 04/05/2020 1400   HGBUR MODERATE (A) 04/05/2020 1400   BILIRUBINUR NEGATIVE 04/05/2020 1400   BILIRUBINUR small 04/02/2020 [place] 04/05/2020 1400   PROTEINUR NEGATIVE 04/05/2020 1400   UROBILINOGEN 4.0 (A) 04/02/2020 1439   NITRITE NEGATIVE 04/05/2020 1400   LEUKOCYTESUR NEGATIVE 04/05/2020 1400    ----------------------------------------------------------------------------------------------------------------   Imaging Results:    No results found.    Assessment & Plan:  Principal Problem:   Sickle cell pain crisis (HCC) Active Problems:   Leukocytosis   Chronic pain syndrome   Anemia of chronic disease   Anxiety  Sickle cell disease with pain crisis:  Admit to [facility] for further management of pain crisis. Continue D5 0.45% saline at 125 mL/h IV Dilaudid PCA with settings of 0.5 mg, 10-minute lockout, and 3 mg/h. Toradol 15 mg IV every 6 hours for total of 5 days. Monitor vital signs closely, reevaluate pain scale regularly, and supplemental oxygen as needed. Pain will be reevaluated in context of function and relationship to baseline as patient's care progresses.  Sickle cell anemia: Hemoglobin 8.1, consistent with patient's baseline. There is no clinical indication for blood transfusion on today. Folic acid 1 mg daily  Leukocytosis: WBCs mildly elevated. Patient is afebrile without any signs of infection or inflammation. COVID-19 test pending.  Chronic pain syndrome: Continue OxyContin 10 mg every 12 hours Hold oxycodone, use PCA Dilaudid as substitute DVT Prophylaxis: SCDs  Generalized anxiety disorder: Controlled, continue home  medications, patient denies any suicidal or homicidal ideations  Hypokalemia: Potassium 3.3, replete. K. Dur 20 mEq daily. Follow BMP in a.m.   AM Labs Ordered, also please review Full Orders  Family Communication: Admission, patient's condition and plan of care including tests being ordered have been discussed with the patient who indicate understanding and agree with the plan and Code Status.  Code Status: Full Code  Consults called: None    Admission status: Inpatient    Time spent in minutes : 50 minutes  [place], MSN, FNP-C [facility] [address] [place], [place] [address] [phone]  04/05/2020 at 5:28 PM

## 2020-04-05 NOTE — Progress Notes (Signed)
Pt admitted to [facility] 4W due to bed availability. Patient stated that she had an uneasy feeling the moment she came to 4W. Pt upset and tearful stating that she did not want to stay and wanted a different room on a different floor due to being scared and having an uneasy feeling on 4W. CN called regarding request. No other rooms available on other departments at this time. Support provided to patient that she will receive excellent care from staff. Patient stated that she did not want to stay and she would call sickle cell center tomorrow. Patient taken to front by wheelchair.  Patient refused to sign AMA papers because she was given the choice to be admitted so her leaving was "not against medical advice since she was given the choice to be admitted".

## 2020-04-05 NOTE — Telephone Encounter (Signed)
Kara [name] stated she has tried all  her pain meds  since 5 AM. The  pain has not gotten any better.   Patient to be assessed by [facility].

## 2020-04-05 NOTE — H&P (Signed)
[facility] History and Physical   Date: 04/05/2020  Patient name: Kara [name] Medical record number: [id number] Date of birth: 12/27/1985 Age: 34 y.o. Gender: female PCP: Azzie Glatter, FNP  Attending physician: Tresa Garter, MD  Chief Complaint: Sickle cell pain  History of Present Illness: Yailen Zemaitis is a 34 year old female with a medical history significant for sickle cell disease, chronic pain syndrome, opiate dependence and tolerance, GERD, and pulmonary hypertension presents complaining of generalized pain that is consistent with her typical pain crisis.  Patient says that pain is primarily to low back, abdomen, and lower extremities.  She attributes pain crisis to increased activity, she started experiencing pain after going fishing on yesterday.  Pain intensity is 9/10 characterized as constant and throbbing.  Patient last had OxyContin this a.m. without sustained relief.  She denies sick contacts, recent travel, or exposure to COVID-19.  Patient also denies fever, chills, chest pain, shortness of breath, urinary symptoms, nausea, vomiting, or diarrhea.  Patient mentioned that she is currently on doxycycline for a boil to her right buttocks.  She is on day 3 of antibiotics.  Meds: Medications Prior to Admission  Medication Sig Dispense Refill Last Dose  . doxycycline (VIBRAMYCIN) 100 MG capsule Take 1 capsule (100 mg total) by mouth 2 (two) times daily for 7 days. 14 capsule 0 04/05/2020 at Unknown time  . omeprazole (PRILOSEC) 40 MG capsule Take 1 capsule (40 mg total) by mouth daily. 30 capsule 3 04/05/2020 at Unknown time  . oxyCODONE (OXYCONTIN) 10 mg 12 hr tablet Take 1 tablet (10 mg total) by mouth every 12 (twelve) hours. 'For Non-Acute Pain' 60 tablet 0 04/05/2020 at Unknown time  . oxyCODONE (ROXICODONE) 15 MG immediate release tablet Take 1 tablet (15 mg total) by mouth every 4 (four) hours as needed for up to 15 days. 90 tablet 0 04/05/2020 at  Unknown time  . Benzocaine 20 % OINT Apply 1 application topically 4 (four) times daily as needed. 28 g 2   . butalbital-acetaminophen-caffeine (FIORICET) 50-325-40 MG tablet Take 1 tablet by mouth every 6 (six) hours as needed for headache. 14 tablet 0   . clonazePAM (KLONOPIN) 1 MG tablet Take 1 tablet (1 mg total) by mouth daily. 30 tablet 0   . Ensure Plus (ENSURE PLUS) LIQD Take 237 mLs by mouth 3 (three) times daily between meals. 237 mL 6   . fluticasone (FLONASE) 50 MCG/ACT nasal spray Place 2 sprays into both nostrils daily. 16 g 0   . folic acid (FOLVITE) 1 MG tablet Take 1 tablet (1 mg total) by mouth daily. 30 tablet 3   . ibuprofen (ADVIL) 200 MG tablet Take 800 mg by mouth every 6 (six) hours as needed for moderate pain.     . methocarbamol (ROBAXIN) 500 MG tablet Take 1 tablet (500 mg total) by mouth 4 (four) times daily. 120 tablet 0   . naproxen (NAPROSYN) 500 MG tablet Take 1 tablet (500 mg total) by mouth 2 (two) times daily with a meal. 60 tablet 3   . Vitamin D, Ergocalciferol, (DRISDOL) 1.25 MG (50000 UNIT) CAPS capsule Take 1 capsule (50,000 Units total) by mouth every 7 (seven) days. 5 capsule 6     Allergies: Kiwi extract, Morphine and related, and Paroxetine Past Medical History:  Diagnosis Date  . Arthritis   . Chronic migraine   . GERD (gastroesophageal reflux disease)   . Hx of cholecystectomy 2015  . Pulmonary hypertension ([place])   .  Sickle cell anemia (HCC)   . Tendinitis    left elbow   Past Surgical History:  Procedure Laterality Date  . DILATION AND CURETTAGE OF UTERUS    . GALLBLADDER SURGERY     Family History  Adopted: Yes   Social History   Socioeconomic History  . Marital status: Single    Spouse name: Not on file  . Number of children: Not on file  . Years of education: Not on file  . Highest education level: Not on file  Occupational History  . Not on file  Tobacco Use  . Smoking status: Current Every Day Smoker  . Smokeless  tobacco: Never Used  Substance and Sexual Activity  . Alcohol use: No  . Drug use: Yes    Types: Marijuana  . Sexual activity: Yes    Birth control/protection: None  Other Topics Concern  . Not on file  Social History Narrative  . Not on file   Social Determinants of Health   Financial Resource Strain:   . Difficulty of Paying Living Expenses:   Food Insecurity:   . Worried About Programme researcher, broadcasting/film/video in the Last Year:   . Barista in the Last Year:   Transportation Needs:   . Freight forwarder (Medical):   Marland Kitchen Lack of Transportation (Non-Medical):   Physical Activity:   . Days of Exercise per Week:   . Minutes of Exercise per Session:   Stress:   . Feeling of Stress :   Social Connections:   . Frequency of Communication with Friends and Family:   . Frequency of Social Gatherings with Friends and Family:   . Attends Religious Services:   . Active Member of Clubs or Organizations:   . Attends Banker Meetings:   Marland Kitchen Marital Status:   Intimate Partner Violence:   . Fear of Current or Ex-Partner:   . Emotionally Abused:   Marland Kitchen Physically Abused:   . Sexually Abused:     Review of Systems  Constitutional: Negative.   HENT: Negative.   Eyes: Negative.   Respiratory: Negative.   Cardiovascular: Negative.   Genitourinary: Negative.   Musculoskeletal: Positive for back pain and joint pain.  Skin: Negative.        Cyst to right buttocks  Neurological: Negative.   Psychiatric/Behavioral: Negative.     Physical Exam: Blood pressure 102/62, pulse 64, temperature 98.4 F (36.9 C), temperature source Oral, resp. rate 12, last menstrual period 03/06/2020, SpO2 99 %. Physical Exam Constitutional:      Appearance: Normal appearance.  Eyes:     Pupils: Pupils are equal, round, and reactive to light.  Cardiovascular:     Rate and Rhythm: Normal rate and regular rhythm.     Pulses: Normal pulses.  Pulmonary:     Effort: Pulmonary effort is normal.      Breath sounds: Normal breath sounds.  Abdominal:     General: Abdomen is flat. Bowel sounds are normal.  Musculoskeletal:        General: Normal range of motion.  Skin:    General: Skin is warm.  Neurological:     General: No focal deficit present.     Mental Status: She is alert. Mental status is at baseline.  Psychiatric:        Mood and Affect: Mood normal.        Behavior: Behavior normal.        Thought Content: Thought content normal.  Judgment: Judgment normal.      Lab results: Results for orders placed or performed during the hospital encounter of 04/05/20 (from the past 24 hour(s))  Comprehensive metabolic panel     Status: Abnormal   Collection Time: 04/05/20  1:01 PM  Result Value Ref Range   Sodium 140 135 - 145 mmol/L   Potassium 3.3 (L) 3.5 - 5.1 mmol/L   Chloride 104 98 - 111 mmol/L   CO2 26 22 - 32 mmol/L   Glucose, Bld 97 70 - 99 mg/dL   BUN <5 (L) 6 - 20 mg/dL   Creatinine, Ser 6.22 (L) 0.44 - 1.00 mg/dL   Calcium 8.9 8.9 - 29.7 mg/dL   Total Protein 7.7 6.5 - 8.1 g/dL   Albumin 3.9 3.5 - 5.0 g/dL   AST 27 15 - 41 U/L   ALT 13 0 - 44 U/L   Alkaline Phosphatase 53 38 - 126 U/L   Total Bilirubin 3.7 (H) 0.3 - 1.2 mg/dL   GFR calc non Af Amer >60 >60 mL/min   GFR calc Af Amer >60 >60 mL/min   Anion gap 10 5 - 15  CBC WITH DIFFERENTIAL     Status: Abnormal   Collection Time: 04/05/20  1:01 PM  Result Value Ref Range   WBC 14.1 (H) 4.0 - 10.5 K/uL   RBC 2.36 (L) 3.87 - 5.11 MIL/uL   Hemoglobin 8.1 (L) 12.0 - 15.0 g/dL   HCT 98.9 (L) 21.1 - 94.1 %   MCV 100.8 (H) 80.0 - 100.0 fL   MCH 34.3 (H) 26.0 - 34.0 pg   MCHC 34.0 30.0 - 36.0 g/dL   RDW 74.0 (H) 81.4 - 48.1 %   Platelets 449 (H) 150 - 400 K/uL   nRBC 0.9 (H) 0.0 - 0.2 %   Neutrophils Relative % 36 %   Neutro Abs 5.1 1.7 - 7.7 K/uL   Lymphocytes Relative 52 %   Lymphs Abs 7.3 (H) 0.7 - 4.0 K/uL   Monocytes Relative 6 %   Monocytes Absolute 0.9 0.1 - 1.0 K/uL   Eosinophils Relative 3  %   Eosinophils Absolute 0.5 0.0 - 0.5 K/uL   Basophils Relative 1 %   Basophils Absolute 0.1 0.0 - 0.1 K/uL   WBC Morphology ABSOLUTE LYMPHOCYTOSIS    Immature Granulocytes 2 %   Abs Immature Granulocytes 0.23 (H) 0.00 - 0.07 K/uL   Carollee Massed Bodies PRESENT    Polychromasia PRESENT    Basophilic Stippling PRESENT    Sickle Cells PRESENT    Target Cells PRESENT   Reticulocytes     Status: Abnormal   Collection Time: 04/05/20  1:01 PM  Result Value Ref Range   Retic Ct Pct 15.4 (H) 0.4 - 3.1 %   RBC. 2.35 (L) 3.87 - 5.11 MIL/uL   Retic Count, Absolute 385.0 (H) 19.0 - 186.0 K/uL   Immature Retic Fract 28.5 (H) 2.3 - 15.9 %  Urinalysis, Routine w reflex microscopic     Status: Abnormal   Collection Time: 04/05/20  2:00 PM  Result Value Ref Range   Color, Urine YELLOW YELLOW   APPearance CLEAR CLEAR   Specific Gravity, Urine 1.010 1.005 - 1.030   pH 6.0 5.0 - 8.0   Glucose, UA NEGATIVE NEGATIVE mg/dL   Hgb urine dipstick MODERATE (A) NEGATIVE   Bilirubin Urine NEGATIVE NEGATIVE   Ketones, ur NEGATIVE NEGATIVE mg/dL   Protein, ur NEGATIVE NEGATIVE mg/dL   Nitrite NEGATIVE NEGATIVE   Leukocytes,Ua  NEGATIVE NEGATIVE   RBC / HPF 0-5 0 - 5 RBC/hpf   WBC, UA 0-5 0 - 5 WBC/hpf   Bacteria, UA NONE SEEN NONE SEEN   Squamous Epithelial / LPF 0-5 0 - 5   Mucus PRESENT   Pregnancy, urine     Status: None   Collection Time: 04/05/20  2:00 PM  Result Value Ref Range   Preg Test, Ur NEGATIVE NEGATIVE    Imaging results:  No results found.   Assessment & Plan: Patient admitted to sickle cell day infusion center for management of pain crisis.  Patient is opiate tolerant Initiate IV dilaudid PCA. Settings of 0.5 mg, 10 minute lockout, and 3 mg per hour IV fluids, D5.45% saline at 125 ml/hr Toradol 15 mg IV times one dose Tylenol 1000 mg by mouth times one dose Review CBC with differential, complete metabolic panel, and reticulocytes as results become available. Baseline  hemoglobin is Pain intensity will be reevaluated in context of functioning and relationship to baseline as care progress If pain intensity remains elevated and/or sudden change in hemodynamic stability transition to inpatient services for higher level of care.       Nolon Nations  APRN, MSN, FNP-C [facility] [address] [place], [place] [address] [phone]  04/05/2020, 5:23 PM

## 2020-04-05 NOTE — Progress Notes (Signed)
Patient was admitted to [facility] room 09 from Patient care center. Report was given to [facility]. RN and the patient got to [address]. There, RN was informed patient is now going to [facility] room 10. RN and the patient got to [address] [place], RN was again informed patient has been moved to [facility] room 43. When RN and the patient got to [address], patient broke down crying stating she has a "bad feeling" about the "floor" and the "room". She insisted, she will like to be discharged and that she will call the patient center back tomorrow morning to be re-admitted. The receiving RN told the patient it was her choice to leave against medical advice. The patient wanted her IV taken out which the receiving RN did. Patient was taken to the main entrance of the hospital in a wheelchair. At the main entrance, patient stated she can walk by herself. RN offered to wait with the patient for the vehicle to be brought to the main entrance to pick the patient up but the patient got out of the wheelchair in the company of a female companion and left. The provider Hart Rochester lachina FNP) was notified about the incident.

## 2020-04-05 NOTE — Discharge Instructions (Signed)
Sickle Cell Anemia, Adult  Sickle cell anemia is a condition where your red blood cells are shaped like sickles. Red blood cells carry oxygen through the body. Sickle-shaped cells do not live as long as normal red blood cells. They also clump together and block blood from flowing through the blood vessels. This prevents the body from getting enough oxygen. Sickle cell anemia causes organ damage and pain. It also increases the risk of infection. Follow these instructions at home: Medicines  Take over-the-counter and prescription medicines only as told by your doctor.  If you were prescribed an antibiotic medicine, take it as told by your doctor. Do not stop taking the antibiotic even if you start to feel better.  If you develop a fever, do not take medicines to lower the fever right away. Tell your doctor about the fever. Managing pain, stiffness, and swelling  Try these methods to help with pain: ? Use a heating pad. ? Take a warm bath. ? Distract yourself, such as by watching TV. Eating and drinking  Drink enough fluid to keep your pee (urine) clear or pale yellow. Drink more in hot weather and during exercise.  Limit or avoid alcohol.  Eat a healthy diet. Eat plenty of fruits, vegetables, whole grains, and lean protein.  Take vitamins and supplements as told by your doctor. Traveling  When traveling, keep these with you: ? Your medical information. ? The names of your doctors. ? Your medicines.  If you need to take an airplane, talk to your doctor first. Activity  Rest often.  Avoid exercises that make your heart beat much faster, such as jogging. General instructions  Do not use products that have nicotine or tobacco, such as cigarettes and e-cigarettes. If you need help quitting, ask your doctor.  Consider wearing a medical alert bracelet.  Avoid being in high places (high altitudes), such as mountains.  Avoid very hot or cold temperatures.  Avoid places where the  temperature changes a lot.  Keep all follow-up visits as told by your doctor. This is important. Contact a doctor if:  A joint hurts.  Your feet or hands hurt or swell.  You feel tired (fatigued). Get help right away if:  You have symptoms of infection. These include: ? Fever. ? Chills. ? Being very tired. ? Irritability. ? Poor eating. ? Throwing up (vomiting).  You feel dizzy or faint.  You have new stomach pain, especially on the left side.  You have a an erection (priapism) that lasts more than 4 hours.  You have numbness in your arms or legs.  You have a hard time moving your arms or legs.  You have trouble talking.  You have pain that does not go away when you take medicine.  You are short of breath.  You are breathing fast.  You have a long-term cough.  You have pain in your chest.  You have a bad headache.  You have a stiff neck.  Your stomach looks bloated even though you did not eat much.  Your skin is pale.  You suddenly cannot see well. Summary  Sickle cell anemia is a condition where your red blood cells are shaped like sickles.  Follow your doctor's advice on ways to manage pain, food to eat, activities to do, and steps to take for safe travel.  Get medical help right away if you have any signs of infection, such as a fever. This information is not intended to replace advice given to you by   your health care provider. Make sure you discuss any questions you have with your health care provider. Document Revised: 02/28/2019 Document Reviewed: 12/12/2016 Elsevier Patient Education  2020 [employer]  

## 2020-04-05 NOTE — Progress Notes (Signed)
Patient admitted to the day hospital for treatment of sickle cell pain crisis. Patient reported pain rated 10/10 in the legs, back and abdomen. Patient placed on Dilaudid PCA, given IV Toradol and hydrated with IV fluids. Patient transferred to [facility] long 5 east room 09.  Reported pain on transfer was  8/10. Report was given to [facility]. Patient alert, oriented and transported in a wheelchair.

## 2020-04-05 NOTE — Telephone Encounter (Signed)
Patient called requesting to come to the day hospital for sickle cell pain. Patient reports back, abdominal and bilateral leg pain rated 10/10. Reports taking Oxycodone 15 and Naproxen at 5:00 am. COVID-19 screening done and patient denies all symptoms and exposures. Denies fever, chest pain, nausea, vomiting and diarrhea. Reports having right side abdominal pain. Admits to having transportation at discharge without driving self. [place], FNP notified. Patient can come to the day hospital for pain management. Patient advised and expresses an understanding.

## 2020-04-12 ENCOUNTER — Telehealth: Payer: Self-pay | Admitting: Nurse Practitioner

## 2020-04-12 NOTE — Telephone Encounter (Signed)
Jendayi said the area has decreased in size. But is still painful & itching. She has an appointment for follow up tomorrow.

## 2020-04-12 NOTE — Telephone Encounter (Signed)
Can you call and f/u with her. She will have to be seen . Its a abscess on her buttocks. thanks

## 2020-04-13 ENCOUNTER — Encounter: Payer: Self-pay | Admitting: Family Medicine

## 2020-04-13 ENCOUNTER — Other Ambulatory Visit: Payer: Self-pay

## 2020-04-13 ENCOUNTER — Ambulatory Visit (INDEPENDENT_AMBULATORY_CARE_PROVIDER_SITE_OTHER): Payer: Medicare Other | Admitting: Family Medicine

## 2020-04-13 VITALS — BP 120/73 | HR 92 | Temp 98.4°F | Ht 65.0 in | Wt 123.8 lb

## 2020-04-13 DIAGNOSIS — D571 Sickle-cell disease without crisis: Secondary | ICD-10-CM

## 2020-04-13 DIAGNOSIS — G8929 Other chronic pain: Secondary | ICD-10-CM | POA: Diagnosis not present

## 2020-04-13 DIAGNOSIS — L0231 Cutaneous abscess of buttock: Secondary | ICD-10-CM

## 2020-04-13 DIAGNOSIS — F1190 Opioid use, unspecified, uncomplicated: Secondary | ICD-10-CM | POA: Diagnosis not present

## 2020-04-13 DIAGNOSIS — F419 Anxiety disorder, unspecified: Secondary | ICD-10-CM

## 2020-04-13 DIAGNOSIS — Z09 Encounter for follow-up examination after completed treatment for conditions other than malignant neoplasm: Secondary | ICD-10-CM

## 2020-04-13 DIAGNOSIS — N9089 Other specified noninflammatory disorders of vulva and perineum: Secondary | ICD-10-CM | POA: Insufficient documentation

## 2020-04-13 MED ORDER — OXYCODONE HCL 15 MG PO TABS: 15.0000 mg | ORAL_TABLET | ORAL | 0 refills | Status: DC | PRN

## 2020-04-13 MED ORDER — OXYCODONE HCL ER 10 MG PO T12A: 10.0000 mg | EXTENDED_RELEASE_TABLET | Freq: Three times a day (TID) | ORAL | 0 refills | Status: DC | PRN

## 2020-04-13 MED ORDER — CEPHALEXIN 500 MG PO CAPS: 500.0000 mg | ORAL_CAPSULE | Freq: Two times a day (BID) | ORAL | 0 refills | Status: AC

## 2020-04-13 NOTE — Progress Notes (Signed)
Patient [facility]   Established Patient Office Visit  Subjective:  Patient ID: Kara [name], female    DOB: [birth year]  Age: 34 y.o. MRN: [id number]  CC:  Chief Complaint  Patient presents with  . Follow-up    Buttock infection    HPI Jade Burkard is a 34 year female who presents for Follow Up today.   Past Medical History:  Diagnosis Date  . Arthritis   . Chronic migraine   . GERD (gastroesophageal reflux disease)   . Hx of cholecystectomy 2015  . Pulmonary hypertension ([place])   . Sickle cell anemia (H[facility])   . Tendinitis    left elbow   Patient Active Problem List   Diagnosis Date Noted  . 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 Sickle cell anemia with pain ([place]) 12/08/2019  . Decreased appetite 08/28/2019  . Chronic pain of right knee 2019/07/23  . Gastroesophageal reflux disease without esophagitis 07-23-19  . Anxiety 07/23/19  . Death of family member 07/23/2019  . Chronic, continuous use of opioids 2019/07/23  . Headache, new daily persistent ([facility]) 07/04/2019  . Sickle cell anemia ([place]) 07/01/2019  . Chronic pain syndrome 12/27/2018  . Anemia of chronic disease 12/27/2018  . Pulmonary hypertension ([place]) 04/04/2018  . Sickle cell disease ([place]) 04/04/2018  . Arthritis 04/04/2018  . Chest pain on breathing   . Leukocytosis 02/06/2018  . Sickle cell crisis ([place]) 02/05/2018  . Sickle cell pain crisis ([place]) 10/29/2017   Current Status: Since her last office visit, she is doing well with no complaints. She states that she has pain in her arms and legs. She rates her pain today at 5/10. She has not had a hospital visit for Sickle Cell Crisis since 04/05/2020 where she was treated and discharged the same day. She is currently taking all medications as prescribed and staying well hydrated. She reports occasional nausea, constipation, dizziness and headaches. She has had cyst on buttocks X 2 weeks. She was  previously treated with Doxycycline and  which was not effective. She denies fevers, chills, fatigue, recent infections, weight loss, and night sweats. She has not had any headaches, visual changes, dizziness, and falls. No chest pain, heart palpitations, cough and shortness of breath reported. Denies GI problems such as nausea, vomiting, diarrhea, and constipation. She has no reports of blood in stools, dysuria and hematuria. No depression or anxiety, and denies suicidal ideations, homicidal ideations, or auditory hallucinations. She is taking all medications as prescribed.   Past Surgical History:  Procedure Laterality Date  . DILATION AND CURETTAGE OF UTERUS    . GALLBLADDER SURGERY      Family History  Adopted: Yes    Social History   Socioeconomic History  . Marital status: Single    Spouse name: Not on file  . Number of children: Not on file  . Years of education: Not on file  . Highest education level: Not on file  Occupational History  . Not on file  Tobacco Use  . Smoking status: Current Every Day Smoker  . Smokeless tobacco: Never Used  Substance and Sexual Activity  . Alcohol use: No  . Drug use: Yes    Types: Marijuana  . Sexual activity: Yes    Birth control/protection: None  Other Topics Concern  . Not on file  Social History Narrative  . Not on file   Social Determinants of Health   Financial Resource Strain:   . Difficulty  of Paying Living Expenses:   Food Insecurity:   . Worried About Programme researcher, broadcasting/film/videounning Out of Food in the Last Year:   . Baristaan Out of Food in the Last Year:   Transportation Needs:   . Freight forwarderLack of Transportation (Medical):   Marland Kitchen. Lack of Transportation (Non-Medical):   Physical Activity:   . Days of Exercise per Week:   . Minutes of Exercise per Session:   Stress:   . Feeling of Stress :   Social Connections:   . Frequency of Communication with Friends and Family:   . Frequency of Social Gatherings with Friends and Family:   . Attends Religious  Services:   . Active Member of Clubs or Organizations:   . Attends BankerClub or Organization Meetings:   Marland Kitchen. Marital Status:   Intimate Partner Violence:   . Fear of Current or Ex-Partner:   . Emotionally Abused:   Marland Kitchen. Physically Abused:   . Sexually Abused:     Outpatient Medications Prior to Visit  Medication Sig Dispense Refill  . butalbital-acetaminophen-caffeine (FIORICET) 50-325-40 MG tablet Take 1 tablet by mouth every 6 (six) hours as needed for headache. 14 tablet 0  . Ensure Plus (ENSURE PLUS) LIQD Take 237 mLs by mouth 3 (three) times daily between meals. 237 mL 6  . fluticasone (FLONASE) 50 MCG/ACT nasal spray Place 2 sprays into both nostrils daily. 16 g 0  . ibuprofen (ADVIL) 200 MG tablet Take 800 mg by mouth every 6 (six) hours as needed for moderate pain.    . naproxen (NAPROSYN) 500 MG tablet Take 1 tablet (500 mg total) by mouth 2 (two) times daily with a meal. 60 tablet 3  . folic acid (FOLVITE) 1 MG tablet Take 1 tablet (1 mg total) by mouth daily. 30 tablet 3  . methocarbamol (ROBAXIN) 500 MG tablet Take 1 tablet (500 mg total) by mouth 4 (four) times daily. 120 tablet 0  . omeprazole (PRILOSEC) 40 MG capsule Take 1 capsule (40 mg total) by mouth daily. 30 capsule 3  . oxyCODONE (OXYCONTIN) 10 mg 12 hr tablet Take 1 tablet (10 mg total) by mouth every 12 (twelve) hours. 'For Non-Acute Pain' 60 tablet 0  . oxyCODONE (ROXICODONE) 15 MG immediate release tablet Take 1 tablet (15 mg total) by mouth every 4 (four) hours as needed for up to 15 days. 90 tablet 0  . Vitamin D, Ergocalciferol, (DRISDOL) 1.25 MG (50000 UNIT) CAPS capsule Take 1 capsule (50,000 Units total) by mouth every 7 (seven) days. 5 capsule 6  . Benzocaine 20 % OINT Apply 1 application topically 4 (four) times daily as needed. (Patient not taking: Reported on 04/13/2020) 28 g 2  . clonazePAM (KLONOPIN) 1 MG tablet Take 1 tablet (1 mg total) by mouth daily. (Patient not taking: Reported on 04/13/2020) 30 tablet 0    No facility-administered medications prior to visit.    Allergies  Allergen Reactions  . Kiwi Extract Hives  . Morphine And Related Hives  . Paroxetine Hives    ROS Review of Systems  Constitutional: Negative.   HENT: Negative.   Eyes: Negative.   Respiratory: Negative.   Cardiovascular: Negative.   Gastrointestinal: Positive for constipation (occasional ) and nausea (occasional ).  Endocrine: Negative.   Genitourinary: Negative.   Musculoskeletal: Positive for arthralgias (generalized joint pain ).  Skin: Negative.   Allergic/Immunologic: Negative.   Neurological: Positive for dizziness (occasional ) and headaches (occasional ).  Hematological: Negative.   Psychiatric/Behavioral: Negative.  Objective:    Physical Exam  Constitutional: She is oriented to person, place, and time. She appears well-developed and well-nourished.  HENT:  Head: Normocephalic and atraumatic.  Eyes: Conjunctivae are normal.  Cardiovascular: Normal rate, regular rhythm, normal heart sounds and intact distal pulses.  Pulmonary/Chest: Effort normal and breath sounds normal.  Abdominal: Soft. Bowel sounds are normal.  Musculoskeletal:        General: Normal range of motion.     Cervical back: Normal range of motion and neck supple.  Neurological: She is alert and oriented to person, place, and time. She has normal reflexes.  Skin: Skin is warm and dry.  Psychiatric: She has a normal mood and affect. Her behavior is normal. Judgment and thought content normal.  Nursing note and vitals reviewed.   BP 120/73   Pulse 92   Temp 98.4 F (36.9 C)   Ht 5\' 5"  (1.651 m)   Wt 123 lb 12.8 oz (56.2 kg)   SpO2 99%   BMI 20.60 kg/m  Wt Readings from Last 3 Encounters:  04/13/20 123 lb 12.8 oz (56.2 kg)  04/02/20 123 lb (55.8 kg)  02/11/20 124 lb 3.2 oz (56.3 kg)     Health Maintenance Due  Topic Date Due  . COVID-19 Vaccine (1) Never done  . TETANUS/TDAP  Never done  . PAP  SMEAR-Modifier  Never done    There are no preventive care reminders to display for this patient.  No results found for: TSH Lab Results  Component Value Date   WBC 14.1 (H) 04/05/2020   HGB 8.1 (L) 04/05/2020   HCT 23.8 (L) 04/05/2020   MCV 100.8 (H) 04/05/2020   PLT 449 (H) 04/05/2020   Lab Results  Component Value Date   NA 140 04/05/2020   K 3.3 (L) 04/05/2020   CO2 26 04/05/2020   GLUCOSE 97 04/05/2020   BUN <5 (L) 04/05/2020   CREATININE 0.43 (L) 04/05/2020   BILITOT 3.7 (H) 04/05/2020   ALKPHOS 53 04/05/2020   AST 27 04/05/2020   ALT 13 04/05/2020   PROT 7.7 04/05/2020   ALBUMIN 3.9 04/05/2020   CALCIUM 8.9 04/05/2020   ANIONGAP 10 04/05/2020   No results found for: CHOL No results found for: HDL No results found for: LDLCALC No results found for: TRIG No results found for: CHOLHDL No results found for: 04/07/2020    Assessment & Plan:   1. Abscess of right buttock We will initiate antibiotic today.   2. Hb-SS disease without crisis [facility]) He is doing well today r/t his chronic pain management. He will continue to take pain medications as prescribed; will continue to avoid extreme heat and cold; will continue to eat a healthy diet and drink at least 64 ounces of water daily; continue stool softener as needed; will avoid colds and flu; will continue to get plenty of sleep and rest; will continue to avoid high stressful situations and remain infection free; will continue Folic Acid 1 mg daily to avoid sickle cell crisis. Continue to follow up with Hematologist as needed.  - oxyCODONE (OXYCONTIN) 10 mg 12 hr tablet; Take 1 tablet (10 mg total) by mouth every 8 (eight) hours as needed.  Dispense: 90 tablet; Refill: 0 - oxyCODONE (ROXICODONE) 15 MG immediate release tablet; Take 1 tablet (15 mg total) by mouth every 4 (four) hours as needed for up to 15 days.  Dispense: 90 tablet; Refill: 0  3. Other chronic pain - oxyCODONE (OXYCONTIN) 10 mg 12 hr tablet;  Take 1 tablet  (10 mg total) by mouth every 8 (eight) hours as needed.  Dispense: 90 tablet; Refill: 0 - oxyCODONE (ROXICODONE) 15 MG immediate release tablet; Take 1 tablet (15 mg total) by mouth every 4 (four) hours as needed for up to 15 days.  Dispense: 90 tablet; Refill: 0  4. Chronic, continuous use of opioids - oxyCODONE (OXYCONTIN) 10 mg 12 hr tablet; Take 1 tablet (10 mg total) by mouth every 8 (eight) hours as needed.  Dispense: 90 tablet; Refill: 0 - oxyCODONE (ROXICODONE) 15 MG immediate release tablet; Take 1 tablet (15 mg total) by mouth every 4 (four) hours as needed for up to 15 days.  Dispense: 90 tablet; Refill: 0  5. Anxiety  6. Follow up She will follow up in 2 months.   Meds ordered this encounter  Medications  . oxyCODONE (OXYCONTIN) 10 mg 12 hr tablet    Sig: Take 1 tablet (10 mg total) by mouth every 8 (eight) hours as needed.    Dispense:  90 tablet    Refill:  0    Dose frequency change on 04/13/2020.    Order Specific Question:   Supervising Provider    Answer:   Quentin Angst [id number]  . oxyCODONE (ROXICODONE) 15 MG immediate release tablet    Sig: Take 1 tablet (15 mg total) by mouth every 4 (four) hours as needed for up to 15 days.    Dispense:  90 tablet    Refill:  0    Order Specific Question:   Supervising Provider    Answer:   Quentin Angst [id number]  . cephALEXin (KEFLEX) 500 MG capsule    Sig: Take 1 capsule (500 mg total) by mouth 2 (two) times daily for 10 days.    Dispense:  20 capsule    Refill:  0    No orders of the defined types were placed in this encounter.   Referral Orders  No referral(s) requested today    Raliegh Ip,  MSN, FNP-BC [facility] [address] [place], [place] [address] [phone]- fax    Problem List Items Addressed This Visit      Other   Anxiety   Chronic, continuous use of opioids   Relevant Medications    oxyCODONE (OXYCONTIN) 10 mg 12 hr tablet   oxyCODONE (ROXICODONE) 15 MG immediate release tablet   Sickle cell anemia (HCC)   Relevant Medications   oxyCODONE (OXYCONTIN) 10 mg 12 hr tablet   oxyCODONE (ROXICODONE) 15 MG immediate release tablet    Other Visit Diagnoses    Abscess of right buttock    -  Primary   Other chronic pain       Relevant Medications   oxyCODONE (OXYCONTIN) 10 mg 12 hr tablet   oxyCODONE (ROXICODONE) 15 MG immediate release tablet   Follow up          Meds ordered this encounter  Medications  . oxyCODONE (OXYCONTIN) 10 mg 12 hr tablet    Sig: Take 1 tablet (10 mg total) by mouth every 8 (eight) hours as needed.    Dispense:  90 tablet    Refill:  0    Dose frequency change on 04/13/2020.    Order Specific Question:   Supervising Provider    Answer:   Quentin Angst [id number]  . oxyCODONE (ROXICODONE) 15 MG immediate release tablet    Sig: Take 1 tablet (15 mg  total) by mouth every 4 (four) hours as needed for up to 15 days.    Dispense:  90 tablet    Refill:  0    Order Specific Question:   Supervising Provider    Answer:   Quentin Angst [id number]  . cephALEXin (KEFLEX) 500 MG capsule    Sig: Take 1 capsule (500 mg total) by mouth 2 (two) times daily for 10 days.    Dispense:  20 capsule    Refill:  0    Follow-up: No follow-ups on file.    [name], FNP

## 2020-04-14 ENCOUNTER — Other Ambulatory Visit: Payer: Self-pay | Admitting: Family Medicine

## 2020-04-14 ENCOUNTER — Telehealth: Payer: Self-pay | Admitting: Family Medicine

## 2020-04-14 DIAGNOSIS — E559 Vitamin D deficiency, unspecified: Secondary | ICD-10-CM

## 2020-04-14 DIAGNOSIS — K219 Gastro-esophageal reflux disease without esophagitis: Secondary | ICD-10-CM

## 2020-04-14 MED ORDER — FOLIC ACID 1 MG PO TABS: 1.0000 mg | ORAL_TABLET | Freq: Every day | ORAL | 11 refills | Status: DC

## 2020-04-14 MED ORDER — CETIRIZINE HCL 10 MG PO TABS: 10.0000 mg | ORAL_TABLET | Freq: Every day | ORAL | 11 refills | Status: DC

## 2020-04-14 MED ORDER — OMEPRAZOLE 40 MG PO CPDR: 40.0000 mg | DELAYED_RELEASE_CAPSULE | Freq: Every day | ORAL | 11 refills | Status: DC

## 2020-04-14 MED ORDER — METHOCARBAMOL 500 MG PO TABS: 500.0000 mg | ORAL_TABLET | Freq: Two times a day (BID) | ORAL | 6 refills | Status: DC

## 2020-04-14 MED ORDER — VITAMIN D (ERGOCALCIFEROL) 1.25 MG (50000 UNIT) PO CAPS: 50000.0000 [IU] | ORAL_CAPSULE | ORAL | 2 refills | Status: DC

## 2020-04-14 NOTE — Telephone Encounter (Signed)
Noted thanks °

## 2020-04-14 NOTE — Telephone Encounter (Signed)
Handicap Placard form placed in out-going mail. Patient informed.

## 2020-04-14 NOTE — Telephone Encounter (Signed)
Pt states they need refills on vitamin D, Folic Acid, Muscle Relaxers, Acid Reflux meds, and Allergy meds.

## 2020-04-15 ENCOUNTER — Ambulatory Visit: Payer: Medicare Other | Admitting: Nurse Practitioner

## 2020-04-17 DIAGNOSIS — L0231 Cutaneous abscess of buttock: Secondary | ICD-10-CM | POA: Insufficient documentation

## 2020-04-29 ENCOUNTER — Other Ambulatory Visit: Payer: Self-pay | Admitting: Family Medicine

## 2020-04-29 ENCOUNTER — Telehealth: Payer: Self-pay | Admitting: Family Medicine

## 2020-04-29 DIAGNOSIS — F1190 Opioid use, unspecified, uncomplicated: Secondary | ICD-10-CM

## 2020-04-29 DIAGNOSIS — G8929 Other chronic pain: Secondary | ICD-10-CM

## 2020-04-29 DIAGNOSIS — D571 Sickle-cell disease without crisis: Secondary | ICD-10-CM

## 2020-04-29 MED ORDER — OXYCODONE HCL 15 MG PO TABS: 15.0000 mg | ORAL_TABLET | ORAL | 0 refills | Status: DC | PRN

## 2020-04-29 NOTE — Telephone Encounter (Signed)
Pt called in refill for oxycodone 15mg 

## 2020-05-04 ENCOUNTER — Telehealth (HOSPITAL_COMMUNITY): Payer: Self-pay | Admitting: General Practice

## 2020-05-04 NOTE — Telephone Encounter (Signed)
Patient called, requesting to come to the day hospital due to generalized pain rated at 8/10. Denied chest pain, fever, diarrhea, abdominal pain, nausea/vomitting. Screened negative for Covid-19 symptoms. Admitted to having means of transportation without driving self after treatment. Last took 10 mg of oxycontin and 30 mg of oxycodone at 09:00 pm yesterday. Per provider, patient should continue to take pain medication as prescribed, hydrate with 64 ounces of water, alternate between OTC Tylenol and Ibuprofen and if the pain does not subside, patient can call the [facility] back in the morning. Patient also indicated wanting a referral to [facility] hospital. Patient's Primary care provider will be notified about this request and patient will be called back. Patient notified, verbalized understanding.

## 2020-05-14 ENCOUNTER — Ambulatory Visit (INDEPENDENT_AMBULATORY_CARE_PROVIDER_SITE_OTHER): Payer: Medicare Other | Admitting: Family Medicine

## 2020-05-14 ENCOUNTER — Encounter: Payer: Self-pay | Admitting: Family Medicine

## 2020-05-14 ENCOUNTER — Other Ambulatory Visit: Payer: Self-pay

## 2020-05-14 VITALS — BP 103/66 | HR 91 | Temp 98.1°F | Ht 65.0 in | Wt 121.0 lb

## 2020-05-14 DIAGNOSIS — D571 Sickle-cell disease without crisis: Secondary | ICD-10-CM | POA: Diagnosis not present

## 2020-05-14 DIAGNOSIS — F1190 Opioid use, unspecified, uncomplicated: Secondary | ICD-10-CM

## 2020-05-14 DIAGNOSIS — G8929 Other chronic pain: Secondary | ICD-10-CM | POA: Diagnosis not present

## 2020-05-14 DIAGNOSIS — F419 Anxiety disorder, unspecified: Secondary | ICD-10-CM

## 2020-05-14 DIAGNOSIS — Z09 Encounter for follow-up examination after completed treatment for conditions other than malignant neoplasm: Secondary | ICD-10-CM

## 2020-05-14 MED ORDER — OXYCODONE HCL 15 MG PO TABS: 15.0000 mg | ORAL_TABLET | ORAL | 0 refills | Status: DC | PRN

## 2020-05-14 NOTE — Progress Notes (Signed)
Patient [facility]   Established Patient Office Visit  Subjective:  Patient ID: Kara [name], female    DOB: February 27, 1986  Age: 34 y.o. MRN: [id number]  CC:  Chief Complaint  Patient presents with  . Follow-up    no concerns.     HPI Kara [name] is a 34 year old female who presents for Follow up today.    Patient Active Problem List   Diagnosis Date Noted  . Abscess of right buttock 04/17/2020  . 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 Sickle cell anemia with pain ([place]) 12/08/2019  . Decreased appetite 08/28/2019  . Chronic pain of right knee 2019/08/10  . Gastroesophageal reflux disease without esophagitis 2019-08-10  . Anxiety 2019-08-10  . Death of family member 08/10/19  . Chronic, continuous use of opioids 10-Aug-2019  . Headache, new daily persistent ([facility]) 07/04/2019  . Sickle cell anemia ([place]) 07/01/2019  . Chronic pain syndrome 12/27/2018  . Anemia of chronic disease 12/27/2018  . Pulmonary hypertension ([place]) 04/04/2018  . Sickle cell disease ([place]) 04/04/2018  . Arthritis 04/04/2018  . Chest pain on breathing   . Leukocytosis 02/06/2018  . Sickle cell crisis ([place]) 02/05/2018  . Sickle cell pain crisis ([place]) 10/29/2017    Past Medical History:  Diagnosis Date  . Arthritis   . Chronic migraine   . GERD (gastroesophageal reflux disease)   . Hx of cholecystectomy 2015  . Pulmonary hypertension ([place])   . Sickle cell anemia ([facility])   . Tendinitis    left elbow   Current Status: Since her last office visit, she states that she has been in crisis since Sunday, but has been dealing with pain. Today, she is requesting transferring to [facility], where she feels that she will receive better treatment.  She states that she has pain in her back and legs. She rates her pain today at 7/10. She has not had a hospital visit for Sickle Cell Crisis since 04/05/2020 where she was treated in [facility]  and discharged the same day. She is currently taking all medications as prescribed and staying well hydrated. She reports occasional nausea, constipation, dizziness and headaches. She denies fevers, chills, fatigue, recent infections, weight loss, and night sweats. She has not had any headaches, visual changes, dizziness, and falls. No chest pain, heart palpitations, cough and shortness of breath reported. Denies GI problems such as vomiting, and diarrhea. She has no reports of blood in stools, dysuria and hematuria. Her anxiety is moderate today. She denies suicidal ideations, homicidal ideations, or auditory hallucinations. She is taking all medications as prescribed.   Past Surgical History:  Procedure Laterality Date  . DILATION AND CURETTAGE OF UTERUS    . GALLBLADDER SURGERY      Family History  Adopted: Yes    Social History   Socioeconomic History  . Marital status: Single    Spouse name: Not on file  . Number of children: Not on file  . Years of education: Not on file  . Highest education level: Not on file  Occupational History  . Not on file  Tobacco Use  . Smoking status: Current Every Day Smoker  . Smokeless tobacco: Never Used  Vaping Use  . Vaping Use: Never used  Substance and Sexual Activity  . Alcohol use: No  . Drug use: Yes    Types: Marijuana  . Sexual activity: Yes    Birth control/protection: None  Other Topics Concern  .  Not on file  Social History Narrative  . Not on file   Social Determinants of Health   Financial Resource Strain:   . Difficulty of Paying Living Expenses:   Food Insecurity:   . Worried About Programme researcher, broadcasting/film/video in the Last Year:   . Barista in the Last Year:   Transportation Needs:   . Freight forwarder (Medical):   Marland Kitchen Lack of Transportation (Non-Medical):   Physical Activity:   . Days of Exercise per Week:   . Minutes of Exercise per Session:   Stress:   . Feeling of Stress :   Social Connections:   .  Frequency of Communication with Friends and Family:   . Frequency of Social Gatherings with Friends and Family:   . Attends Religious Services:   . Active Member of Clubs or Organizations:   . Attends Banker Meetings:   Marland Kitchen Marital Status:   Intimate Partner Violence:   . Fear of Current or Ex-Partner:   . Emotionally Abused:   Marland Kitchen Physically Abused:   . Sexually Abused:     Outpatient Medications Prior to Visit  Medication Sig Dispense Refill  . butalbital-acetaminophen-caffeine (FIORICET) 50-325-40 MG tablet Take 1 tablet by mouth every 6 (six) hours as needed for headache. 14 tablet 0  . cetirizine (ZYRTEC) 10 MG tablet Take 1 tablet (10 mg total) by mouth daily. 30 tablet 11  . Ensure Plus (ENSURE PLUS) LIQD Take 237 mLs by mouth 3 (three) times daily between meals. 237 mL 6  . fluticasone (FLONASE) 50 MCG/ACT nasal spray Place 2 sprays into both nostrils daily. 16 g 0  . folic acid (FOLVITE) 1 MG tablet Take 1 tablet (1 mg total) by mouth daily. 30 tablet 11  . ibuprofen (ADVIL) 200 MG tablet Take 800 mg by mouth every 6 (six) hours as needed for moderate pain.    . methocarbamol (ROBAXIN) 500 MG tablet Take 1 tablet (500 mg total) by mouth in the morning and at bedtime. 60 tablet 6  . naproxen (NAPROSYN) 500 MG tablet Take 1 tablet (500 mg total) by mouth 2 (two) times daily with a meal. 60 tablet 3  . omeprazole (PRILOSEC) 40 MG capsule Take 1 capsule (40 mg total) by mouth daily. 30 capsule 11  . Vitamin D, Ergocalciferol, (DRISDOL) 1.25 MG (50000 UNIT) CAPS capsule Take 1 capsule (50,000 Units total) by mouth every 7 (seven) days. 5 capsule 2  . oxyCODONE (ROXICODONE) 15 MG immediate release tablet Take 1 tablet (15 mg total) by mouth every 4 (four) hours as needed for up to 15 days. 90 tablet 0  . Benzocaine 20 % OINT Apply 1 application topically 4 (four) times daily as needed. (Patient not taking: Reported on 04/13/2020) 28 g 2  . hydrOXYzine (ATARAX/VISTARIL) 10 MG  tablet Take 10 mg by mouth 2 (two) times daily.     No facility-administered medications prior to visit.    Allergies  Allergen Reactions  . Kiwi Extract Hives  . Morphine And Related Hives  . Paroxetine Hives    ROS Review of Systems  Constitutional: Negative.   HENT: Negative.   Eyes: Negative.   Respiratory: Negative.   Cardiovascular: Negative.   Gastrointestinal: Positive for constipation (occasional ) and nausea (occasional ).  Endocrine: Negative.   Genitourinary: Negative.   Musculoskeletal: Positive for arthralgias (generalized joint pain).  Skin: Negative.   Allergic/Immunologic: Negative.   Neurological: Positive for dizziness (occasional ) and headaches (occasional).  Hematological: Negative.   Psychiatric/Behavioral: Positive for agitation. The patient is nervous/anxious and is hyperactive.    Objective:    Physical Exam Vitals and nursing note reviewed.  Constitutional:      Appearance: Normal appearance.  HENT:     Head: Normocephalic and atraumatic.     Nose: Nose normal.     Mouth/Throat:     Mouth: Mucous membranes are dry.  Cardiovascular:     Rate and Rhythm: Normal rate and regular rhythm.     Pulses: Normal pulses.     Heart sounds: Normal heart sounds.  Pulmonary:     Effort: Pulmonary effort is normal.     Breath sounds: Normal breath sounds.  Abdominal:     General: Abdomen is flat. Bowel sounds are normal.     Palpations: Abdomen is soft.  Musculoskeletal:        General: Normal range of motion.     Cervical back: Normal range of motion and neck supple.  Skin:    General: Skin is warm and dry.  Neurological:     General: No focal deficit present.     Mental Status: She is alert and oriented to person, place, and time.  Psychiatric:        Mood and Affect: Mood normal.        Behavior: Behavior normal.        Thought Content: Thought content normal.        Judgment: Judgment normal.     BP 103/66 (BP Location: Left Arm,  Patient Position: Sitting, Cuff Size: Small)   Pulse 91   Temp 98.1 F (36.7 C)   Ht 5\' 5"  (1.651 m)   Wt 121 lb 0.6 oz (54.9 kg)   LMP 05/04/2020   SpO2 98%   BMI 20.14 kg/m  Wt Readings from Last 3 Encounters:  05/14/20 121 lb 0.6 oz (54.9 kg)  04/13/20 123 lb 12.8 oz (56.2 kg)  04/02/20 123 lb (55.8 kg)     Health Maintenance Due  Topic Date Due  . Hepatitis C Screening  Never done  . COVID-19 Vaccine (1) Never done  . TETANUS/TDAP  Never done  . PAP SMEAR-Modifier  Never done    There are no preventive care reminders to display for this patient.  No results found for: TSH Lab Results  Component Value Date   WBC 14.1 (H) 04/05/2020   HGB 8.1 (L) 04/05/2020   HCT 23.8 (L) 04/05/2020   MCV 100.8 (H) 04/05/2020   PLT 449 (H) 04/05/2020   Lab Results  Component Value Date   NA 140 04/05/2020   K 3.3 (L) 04/05/2020   CO2 26 04/05/2020   GLUCOSE 97 04/05/2020   BUN <5 (L) 04/05/2020   CREATININE 0.43 (L) 04/05/2020   BILITOT 3.7 (H) 04/05/2020   ALKPHOS 53 04/05/2020   AST 27 04/05/2020   ALT 13 04/05/2020   PROT 7.7 04/05/2020   ALBUMIN 3.9 04/05/2020   CALCIUM 8.9 04/05/2020   ANIONGAP 10 04/05/2020   No results found for: CHOL No results found for: HDL No results found for: LDLCALC No results found for: TRIG No results found for: CHOLHDL No results found for: 04/07/2020    Assessment & Plan:   1. Hb-SS disease without crisis [facility]) She is doing fairly well today r/t her chronic pain management. She will continue to take pain medications as prescribed; will continue to avoid extreme heat and cold; will continue to eat a healthy diet and drink at  least 64 ounces of water daily; continue stool softener as needed; will avoid colds and flu; will continue to get plenty of sleep and rest; will continue to avoid high stressful situations and remain infection free; will continue Folic Acid 1 mg daily to avoid sickle cell crisis. Continue to follow up with  Hematologist as needed.  - Ambulatory referral to Hematology - oxyCODONE (ROXICODONE) 15 MG immediate release tablet; Take 1 tablet (15 mg total) by mouth every 4 (four) hours as needed for up to 15 days.  Dispense: 90 tablet; Refill: 0  2. Other chronic pain - oxyCODONE (ROXICODONE) 15 MG immediate release tablet; Take 1 tablet (15 mg total) by mouth every 4 (four) hours as needed for up to 15 days.  Dispense: 90 tablet; Refill: 0  3. Chronic, continuous use of opioids - oxyCODONE (ROXICODONE) 15 MG immediate release tablet; Take 1 tablet (15 mg total) by mouth every 4 (four) hours as needed for up to 15 days.  Dispense: 90 tablet; Refill: 0  4. Anxiety Moderate today.   5. Follow up She is requesting referral to [facility] today, otherwise she will follow up at our clinic in 1 month.  Meds ordered this encounter  Medications  . oxyCODONE (ROXICODONE) 15 MG immediate release tablet    Sig: Take 1 tablet (15 mg total) by mouth every 4 (four) hours as needed for up to 15 days.    Dispense:  90 tablet    Refill:  0    Order Specific Question:   Supervising Provider    Answer:   Quentin Angst [id number]    Orders Placed This Encounter  Procedures  . Ambulatory referral to Hematology     Referral Orders     Ambulatory referral to Hematology   Raliegh Ip,  MSN, FNP-BC [facility] [address] [place], [place] [address] [phone] fax  Problem List Items Addressed This Visit      Other   Anxiety   Relevant Medications   hydrOXYzine (ATARAX/VISTARIL) 10 MG tablet   Chronic, continuous use of opioids   Relevant Medications   oxyCODONE (ROXICODONE) 15 MG immediate release tablet   Sickle cell anemia (HCC) - Primary   Relevant Medications   oxyCODONE (ROXICODONE) 15 MG immediate release tablet   Other Relevant Orders   Ambulatory referral  to Hematology    Other Visit Diagnoses    Other chronic pain       Relevant Medications   oxyCODONE (ROXICODONE) 15 MG immediate release tablet   Follow up          Meds ordered this encounter  Medications  . oxyCODONE (ROXICODONE) 15 MG immediate release tablet    Sig: Take 1 tablet (15 mg total) by mouth every 4 (four) hours as needed for up to 15 days.    Dispense:  90 tablet    Refill:  0    Order Specific Question:   Supervising Provider    Answer:   Quentin Angst [[id number]]    Follow-up: Return in about 1 month (around 06/13/2020).    [name], FNP

## 2020-05-17 ENCOUNTER — Telehealth: Payer: Self-pay | Admitting: Family Medicine

## 2020-05-17 NOTE — Telephone Encounter (Signed)
Pt called expecting a call to discuss treatment plan. Please call pt back asap

## 2020-05-25 ENCOUNTER — Telehealth: Payer: Self-pay | Admitting: Family Medicine

## 2020-05-25 NOTE — Telephone Encounter (Signed)
Pt called in refill for oxycodone 15mg  and oxycotin 10mg 

## 2020-05-27 ENCOUNTER — Other Ambulatory Visit: Payer: Self-pay | Admitting: Family Medicine

## 2020-05-27 DIAGNOSIS — F1190 Opioid use, unspecified, uncomplicated: Secondary | ICD-10-CM

## 2020-05-27 DIAGNOSIS — D571 Sickle-cell disease without crisis: Secondary | ICD-10-CM

## 2020-05-27 DIAGNOSIS — G8929 Other chronic pain: Secondary | ICD-10-CM

## 2020-05-27 MED ORDER — OXYCODONE HCL ER 10 MG PO T12A: 10.0000 mg | EXTENDED_RELEASE_TABLET | Freq: Three times a day (TID) | ORAL | 0 refills | Status: DC | PRN

## 2020-05-27 MED ORDER — OXYCODONE HCL 15 MG PO TABS: 15.0000 mg | ORAL_TABLET | ORAL | 0 refills | Status: DC | PRN

## 2020-06-01 ENCOUNTER — Non-Acute Institutional Stay (HOSPITAL_COMMUNITY)
Admission: AD | Admit: 2020-06-01 | Discharge: 2020-06-01 | Disposition: A | Discharge status: Home / Self Care | Payer: Medicare Other | Source: Ambulatory Visit | Attending: Internal Medicine | Admitting: Internal Medicine

## 2020-06-01 ENCOUNTER — Encounter (HOSPITAL_COMMUNITY): Payer: Self-pay | Admitting: Family Medicine

## 2020-06-01 ENCOUNTER — Telehealth (HOSPITAL_COMMUNITY): Payer: Self-pay | Admitting: General Practice

## 2020-06-01 DIAGNOSIS — D638 Anemia in other chronic diseases classified elsewhere: Secondary | ICD-10-CM | POA: Diagnosis not present

## 2020-06-01 DIAGNOSIS — I272 Pulmonary hypertension, unspecified: Secondary | ICD-10-CM | POA: Diagnosis not present

## 2020-06-01 DIAGNOSIS — Z79899 Other long term (current) drug therapy: Secondary | ICD-10-CM | POA: Insufficient documentation

## 2020-06-01 DIAGNOSIS — F1120 Opioid dependence, uncomplicated: Secondary | ICD-10-CM | POA: Diagnosis not present

## 2020-06-01 DIAGNOSIS — K219 Gastro-esophageal reflux disease without esophagitis: Secondary | ICD-10-CM | POA: Insufficient documentation

## 2020-06-01 DIAGNOSIS — M1990 Unspecified osteoarthritis, unspecified site: Secondary | ICD-10-CM | POA: Insufficient documentation

## 2020-06-01 DIAGNOSIS — F17200 Nicotine dependence, unspecified, uncomplicated: Secondary | ICD-10-CM | POA: Diagnosis not present

## 2020-06-01 DIAGNOSIS — Z885 Allergy status to narcotic agent status: Secondary | ICD-10-CM | POA: Insufficient documentation

## 2020-06-01 DIAGNOSIS — D5700 Hb-SS disease with crisis, unspecified: Secondary | ICD-10-CM | POA: Insufficient documentation

## 2020-06-01 LAB — RETICULOCYTES
Immature Retic Fract: 38.6 % — ABNORMAL HIGH (ref 2.3–15.9)
RBC.: 2.59 MIL/uL — ABNORMAL LOW (ref 3.87–5.11)
Retic Count, Absolute: 389.5 10*3/uL — ABNORMAL HIGH (ref 19.0–186.0)
Retic Ct Pct: 15 % — ABNORMAL HIGH (ref 0.4–3.1)

## 2020-06-01 LAB — COMPREHENSIVE METABOLIC PANEL
ALT: 18 U/L (ref 0–44)
AST: 28 U/L (ref 15–41)
Albumin: 4.4 g/dL (ref 3.5–5.0)
Alkaline Phosphatase: 61 U/L (ref 38–126)
Anion gap: 10 (ref 5–15)
BUN: 5 mg/dL — ABNORMAL LOW (ref 6–20)
CO2: 25 mmol/L (ref 22–32)
Calcium: 9.2 mg/dL (ref 8.9–10.3)
Chloride: 100 mmol/L (ref 98–111)
Creatinine, Ser: 0.44 mg/dL (ref 0.44–1.00)
GFR calc Af Amer: >60 mL/min (ref >60)
GFR calc non Af Amer: >60 mL/min (ref >60)
Glucose, Bld: 102 mg/dL — ABNORMAL HIGH (ref 70–99)
Potassium: 4.1 mmol/L (ref 3.5–5.1)
Sodium: 135 mmol/L (ref 135–145)
Total Bilirubin: 3.7 mg/dL — ABNORMAL HIGH (ref 0.3–1.2)
Total Protein: 8.5 g/dL — ABNORMAL HIGH (ref 6.5–8.1)

## 2020-06-01 LAB — CBC WITH DIFFERENTIAL/PLATELET
Abs Immature Granulocytes: 0.35 K/uL — ABNORMAL HIGH (ref 0.00–0.07)
Basophils Absolute: 0.1 K/uL (ref 0.0–0.1)
Basophils Relative: 1 %
Eosinophils Absolute: 0.2 K/uL (ref 0.0–0.5)
Eosinophils Relative: 1 %
HCT: 25.4 % — ABNORMAL LOW (ref 36.0–46.0)
Hemoglobin: 8.7 g/dL — ABNORMAL LOW (ref 12.0–15.0)
Immature Granulocytes: 2 %
Lymphocytes Relative: 36 %
Lymphs Abs: 8 K/uL — ABNORMAL HIGH (ref 0.7–4.0)
MCH: 33 pg (ref 26.0–34.0)
MCHC: 34.3 g/dL (ref 30.0–36.0)
MCV: 96.2 fL (ref 80.0–100.0)
Monocytes Absolute: 1.4 K/uL — ABNORMAL HIGH (ref 0.1–1.0)
Monocytes Relative: 6 %
Neutro Abs: 12.3 K/uL — ABNORMAL HIGH (ref 1.7–7.7)
Neutrophils Relative %: 54 %
Platelets: 389 K/uL (ref 150–400)
RBC: 2.64 MIL/uL — ABNORMAL LOW (ref 3.87–5.11)
RDW: 22.8 % — ABNORMAL HIGH (ref 11.5–15.5)
WBC: 22.4 K/uL — ABNORMAL HIGH (ref 4.0–10.5)
nRBC: 0.5 % — ABNORMAL HIGH (ref 0.0–0.2)

## 2020-06-01 MED ORDER — ACETAMINOPHEN 500 MG PO TABS
1000.0000 mg | ORAL_TABLET | Freq: Once | ORAL | Status: AC
  Administered 2020-06-01: 1000 mg via ORAL
  Filled 2020-06-01: qty 2

## 2020-06-01 MED ORDER — SODIUM CHLORIDE 0.9% FLUSH: 9.0000 mL | INTRAVENOUS | Status: DC | PRN

## 2020-06-01 MED ORDER — SODIUM CHLORIDE 0.45 % IV SOLN: INTRAVENOUS | Status: DC

## 2020-06-01 MED ORDER — DIPHENHYDRAMINE HCL 25 MG PO CAPS: 25.0000 mg | ORAL_CAPSULE | ORAL | Status: DC | PRN

## 2020-06-01 MED ORDER — KETOROLAC TROMETHAMINE 30 MG/ML IJ SOLN: 15.0000 mg | Freq: Once | INTRAMUSCULAR | Status: DC

## 2020-06-01 MED ORDER — HYDROMORPHONE 1 MG/ML IV SOLN
INTRAVENOUS | Status: DC
  Administered 2020-06-01: 30 mg via INTRAVENOUS
  Administered 2020-06-01: 7 mg via INTRAVENOUS
  Filled 2020-06-01: qty 30

## 2020-06-01 MED ORDER — NALOXONE HCL 0.4 MG/ML IJ SOLN: 0.4000 mg | INTRAMUSCULAR | Status: DC | PRN

## 2020-06-01 MED ORDER — ONDANSETRON HCL 4 MG/2ML IJ SOLN: 4.0000 mg | Freq: Four times a day (QID) | INTRAMUSCULAR | Status: DC | PRN

## 2020-06-01 NOTE — Discharge Instructions (Signed)
Sickle Cell Anemia, Adult  Sickle cell anemia is a condition where your red blood cells are shaped like sickles. Red blood cells carry oxygen through the body. Sickle-shaped cells do not live as long as normal red blood cells. They also clump together and block blood from flowing through the blood vessels. This prevents the body from getting enough oxygen. Sickle cell anemia causes organ damage and pain. It also increases the risk of infection. Follow these instructions at home: Medicines  Take over-the-counter and prescription medicines only as told by your doctor.  If you were prescribed an antibiotic medicine, take it as told by your doctor. Do not stop taking the antibiotic even if you start to feel better.  If you develop a fever, do not take medicines to lower the fever right away. Tell your doctor about the fever. Managing pain, stiffness, and swelling  Try these methods to help with pain: ? Use a heating pad. ? Take a warm bath. ? Distract yourself, such as by watching TV. Eating and drinking  Drink enough fluid to keep your pee (urine) clear or pale yellow. Drink more in hot weather and during exercise.  Limit or avoid alcohol.  Eat a healthy diet. Eat plenty of fruits, vegetables, whole grains, and lean protein.  Take vitamins and supplements as told by your doctor. Traveling  When traveling, keep these with you: ? Your medical information. ? The names of your doctors. ? Your medicines.  If you need to take an airplane, talk to your doctor first. Activity  Rest often.  Avoid exercises that make your heart beat much faster, such as jogging. General instructions  Do not use products that have nicotine or tobacco, such as cigarettes and e-cigarettes. If you need help quitting, ask your doctor.  Consider wearing a medical alert bracelet.  Avoid being in high places (high altitudes), such as mountains.  Avoid very hot or cold temperatures.  Avoid places where the  temperature changes a lot.  Keep all follow-up visits as told by your doctor. This is important. Contact a doctor if:  A joint hurts.  Your feet or hands hurt or swell.  You feel tired (fatigued). Get help right away if:  You have symptoms of infection. These include: ? Fever. ? Chills. ? Being very tired. ? Irritability. ? Poor eating. ? Throwing up (vomiting).  You feel dizzy or faint.  You have new stomach pain, especially on the left side.  You have a an erection (priapism) that lasts more than 4 hours.  You have numbness in your arms or legs.  You have a hard time moving your arms or legs.  You have trouble talking.  You have pain that does not go away when you take medicine.  You are short of breath.  You are breathing fast.  You have a long-term cough.  You have pain in your chest.  You have a bad headache.  You have a stiff neck.  Your stomach looks bloated even though you did not eat much.  Your skin is pale.  You suddenly cannot see well. Summary  Sickle cell anemia is a condition where your red blood cells are shaped like sickles.  Follow your doctor's advice on ways to manage pain, food to eat, activities to do, and steps to take for safe travel.  Get medical help right away if you have any signs of infection, such as a fever. This information is not intended to replace advice given to you by   your health care provider. Make sure you discuss any questions you have with your health care provider. Document Revised: 02/28/2019 Document Reviewed: 12/12/2016 Elsevier Patient Education  2020 [employer]  

## 2020-06-01 NOTE — Telephone Encounter (Signed)
Patient called, requesting to come to the day hospital due to generalized pain rated at 8/10. Denied fever, diarrhea, abdominal pain, nausea/vomitting. Endorsed some shortness of breath due to "smoking". Screened negative for Covid-19 symptoms. Admitted to having means of transportation without driving self after treatment. Last took  15 mg of Oxycodone and 10 mg of oxycontin at 08:00 am today. Per provider, patient can come to the day hospital for treatment. Patient notified, verbalized understanding.

## 2020-06-01 NOTE — H&P (Signed)
[facility] History and Physical   Date: 06/01/2020  Patient name: Kara [name] Medical record number: [id number] Date of birth: 1986/10/20 Age: 34 y.o. Gender: female PCP: [name], FNP  Attending physician: Quentin Angst, MD  Chief Complaint: Sickle cell pain  History of Present Illness: Kara [name] is a 34 year old female with a medical history significant for sickle cell disease, pain syndrome, opiate dependence and tolerance, generalized anxiety, GERD, and anemia of chronic disease resents complaining of generalized pain that is consistent with her typical pain crisis.  Patient has not identified any aggravating factors concerning crisis.  She last had oxycodone this a.m. without sustained relief.  Pain intensity is 9/10 characterized as constant and throbbing.  She denies any fever, chills, headache, shortness of breath, urinary symptoms, nausea, vomiting, or diarrhea.  There are no sick contacts, recent travel, or exposure to COVID-19. Meds: Medications Prior to Admission  Medication Sig Dispense Refill Last Dose  . Benzocaine 20 % OINT Apply 1 application topically 4 (four) times daily as needed. (Patient not taking: Reported on 04/13/2020) 28 g 2   . butalbital-acetaminophen-caffeine (FIORICET) 50-325-40 MG tablet Take 1 tablet by mouth every 6 (six) hours as needed for headache. 14 tablet 0   . cetirizine (ZYRTEC) 10 MG tablet Take 1 tablet (10 mg total) by mouth daily. 30 tablet 11   . Ensure Plus (ENSURE PLUS) LIQD Take 237 mLs by mouth 3 (three) times daily between meals. 237 mL 6   . fluticasone (FLONASE) 50 MCG/ACT nasal spray Place 2 sprays into both nostrils daily. 16 g 0   . folic acid (FOLVITE) 1 MG tablet Take 1 tablet (1 mg total) by mouth daily. 30 tablet 11   . hydrOXYzine (ATARAX/VISTARIL) 10 MG tablet Take 10 mg by mouth 2 (two) times daily.     Marland Kitchen ibuprofen (ADVIL) 200 MG tablet Take 800 mg by mouth every 6 (six) hours as  needed for moderate pain.     . methocarbamol (ROBAXIN) 500 MG tablet Take 1 tablet (500 mg total) by mouth in the morning and at bedtime. 60 tablet 6   . naproxen (NAPROSYN) 500 MG tablet Take 1 tablet (500 mg total) by mouth 2 (two) times daily with a meal. 60 tablet 3   . omeprazole (PRILOSEC) 40 MG capsule Take 1 capsule (40 mg total) by mouth daily. 30 capsule 11   . oxyCODONE (OXYCONTIN) 10 mg 12 hr tablet Take 1 tablet (10 mg total) by mouth every 8 (eight) hours as needed. 90 tablet 0   . oxyCODONE (ROXICODONE) 15 MG immediate release tablet Take 1 tablet (15 mg total) by mouth every 4 (four) hours as needed for up to 15 days. 90 tablet 0   . Vitamin D, Ergocalciferol, (DRISDOL) 1.25 MG (50000 UNIT) CAPS capsule Take 1 capsule (50,000 Units total) by mouth every 7 (seven) days. 5 capsule 2     Allergies: Kiwi extract, Morphine and related, and Paroxetine Past Medical History:  Diagnosis Date  . Arthritis   . Chronic migraine   . GERD (gastroesophageal reflux disease)   . Hx of cholecystectomy 2015  . Pulmonary hypertension (H[facility])   . Sickle cell anemia (H[facility])   . Tendinitis    left elbow   Past Surgical History:  Procedure Laterality Date  . DILATION AND CURETTAGE OF UTERUS    . GALLBLADDER SURGERY     Family History  Adopted: Yes   Social History   Socioeconomic History  .  Marital status: Single    Spouse name: Not on file  . Number of children: Not on file  . Years of education: Not on file  . Highest education level: Not on file  Occupational History  . Not on file  Tobacco Use  . Smoking status: Current Every Day Smoker  . Smokeless tobacco: Never Used  Vaping Use  . Vaping Use: Never used  Substance and Sexual Activity  . Alcohol use: No  . Drug use: Yes    Types: Marijuana  . Sexual activity: Yes    Birth control/protection: None  Other Topics Concern  . Not on file  Social History Narrative  . Not on file   Social Determinants of Health    Financial Resource Strain:   . Difficulty of Paying Living Expenses:   Food Insecurity:   . Worried About Programme researcher, broadcasting/film/video in the Last Year:   . Barista in the Last Year:   Transportation Needs:   . Freight forwarder (Medical):   Marland Kitchen Lack of Transportation (Non-Medical):   Physical Activity:   . Days of Exercise per Week:   . Minutes of Exercise per Session:   Stress:   . Feeling of Stress :   Social Connections:   . Frequency of Communication with Friends and Family:   . Frequency of Social Gatherings with Friends and Family:   . Attends Religious Services:   . Active Member of Clubs or Organizations:   . Attends Banker Meetings:   Marland Kitchen Marital Status:   Intimate Partner Violence:   . Fear of Current or Ex-Partner:   . Emotionally Abused:   Marland Kitchen Physically Abused:   . Sexually Abused:    Review of Systems  Constitutional: Negative.   HENT: Negative.   Eyes: Negative.   Cardiovascular: Negative.   Gastrointestinal: Negative.   Genitourinary: Negative.   Musculoskeletal: Positive for back pain and joint pain.  Skin: Negative.   Neurological: Negative.   Psychiatric/Behavioral: Negative.      Physical Exam: Last menstrual period 05/04/2020. Physical Exam Constitutional:      Appearance: Normal appearance.  HENT:     Head: Normocephalic.     Nose: Nose normal.  Eyes:     Pupils: Pupils are equal, round, and reactive to light.  Cardiovascular:     Rate and Rhythm: Normal rate and regular rhythm.     Pulses: Normal pulses.  Pulmonary:     Effort: Pulmonary effort is normal.  Abdominal:     General: Abdomen is flat. Bowel sounds are normal.  Musculoskeletal:        General: Normal range of motion.  Skin:    General: Skin is warm.  Neurological:     Mental Status: She is alert.  Psychiatric:        Mood and Affect: Mood normal.        Behavior: Behavior normal.        Thought Content: Thought content normal.        Judgment:  Judgment normal.      Lab results: No results found for this or any previous visit (from the past 24 hour(s)).  Imaging results:  No results found.   Assessment & Plan: Patient admitted to sickle cell day infusion center for management of pain crisis.  Patient is opiate tolerant Initiate IV dilaudid PCA. Settings of 0.5 mg, 10 minute lockout, and 3 mg/hr IV fluids, 0.45% saline at 100 ml/hr Toradol 15 mg IV  times one dose Tylenol 1000 mg by mouth times one dose Review CBC with differential, complete metabolic panel, and reticulocytes as results become available. Baseline hemoglobin is Pain intensity will be reevaluated in context of functioning and relationship to baseline as care progress If pain intensity remains elevated and/or sudden change in hemodynamic stability transition to inpatient services for higher level of care.    Nolon Nations  APRN, MSN, FNP-C Patient Care [facility] [address] [place], [place] [address] [phone]   06/01/2020, 10:11 AM

## 2020-06-03 NOTE — Discharge Summary (Signed)
[facility] Discharge Summary   Patient ID: Kara [name] MRN: [id number] DOB/AGE: 34-16-1987 34 y.o.  Admit date: 06/01/2020 Discharge date: 06/03/2020  Primary Care Physician:  [name], FNP  Admission Diagnoses:  Active Problems:   Sickle cell pain crisis [facility])    Discharge Medications:  Allergies as of 06/01/2020      Reactions   Kiwi Extract Hives   Morphine And Related Hives   Paroxetine Hives      Medication List    TAKE these medications   Benzocaine 20 % Oint Apply 1 application topically 4 (four) times daily as needed.   butalbital-acetaminophen-caffeine 50-325-40 MG tablet Commonly known as: FIORICET Take 1 tablet by mouth every 6 (six) hours as needed for headache.   cetirizine 10 MG tablet Commonly known as: ZYRTEC Take 1 tablet (10 mg total) by mouth daily.   Ensure Plus Liqd Take 237 mLs by mouth 3 (three) times daily between meals.   fluticasone 50 MCG/ACT nasal spray Commonly known as: Flonase Place 2 sprays into both nostrils daily.   folic acid 1 MG tablet Commonly known as: FOLVITE Take 1 tablet (1 mg total) by mouth daily.   hydrOXYzine 10 MG tablet Commonly known as: ATARAX/VISTARIL Take 10 mg by mouth 2 (two) times daily.   ibuprofen 200 MG tablet Commonly known as: ADVIL Take 800 mg by mouth every 6 (six) hours as needed for moderate pain.   methocarbamol 500 MG tablet Commonly known as: ROBAXIN Take 1 tablet (500 mg total) by mouth in the morning and at bedtime.   naproxen 500 MG tablet Commonly known as: NAPROSYN Take 1 tablet (500 mg total) by mouth 2 (two) times daily with a meal.   omeprazole 40 MG capsule Commonly known as: PRILOSEC Take 1 capsule (40 mg total) by mouth daily.   oxyCODONE 15 MG immediate release tablet Commonly known as: ROXICODONE Take 1 tablet (15 mg total) by mouth every 4 (four) hours as needed for up to 15 days.   oxyCODONE 10 mg 12 hr tablet Commonly known as:  OxyCONTIN Take 1 tablet (10 mg total) by mouth every 8 (eight) hours as needed.   Vitamin D (Ergocalciferol) 1.25 MG (50000 UNIT) Caps capsule Commonly known as: DRISDOL Take 1 capsule (50,000 Units total) by mouth every 7 (seven) days.        Consults:  None  Significant Diagnostic Studies:  No results found.   History of present illness:  Kara [name] is a 34 year old female with a medical history significant for sickle cell disease, pain syndrome, opiate dependence and tolerance, generalized anxiety, GERD, and anemia of chronic disease resents complaining of generalized pain that is consistent with her typical pain crisis.  Patient has not identified any aggravating factors concerning crisis.  She last had oxycodone this a.m. without sustained relief.  Pain intensity is 9/10 characterized as constant and throbbing.  She denies any fever, chills, headache, shortness of breath, urinary symptoms, nausea, vomiting, or diarrhea.  There are no sick contacts, recent travel, or exposure to COVID-19. [facility] Course: Patient admitted to sickle cell day infusion center for pain management and extended observation. WBCs 22.4, suspected to be reactive.  Patient is afebrile without any signs of infection or inflammation.  It appears that she has chronic leukocytosis. Hemoglobin 8.7, consistent with patient's baseline.  There is no clinical indication for blood transfusion on today. Pain managed with IV Dilaudid via PCA IV fluids, 0.45% saline at 100 mL/h  Toradol 15 mg IV x1 Tylenol 1000 mg x 1 Pain intensity decreased to 6/10.  Admission offered, patient declined.  Advised to follow-up in emergency department if pain intensity worsens.  Patient is alert, oriented, and ambulating without assistance.  She will discharge home in a hemodynamically stable condition.  Discharge instructions: Resume all home medications.   Follow up with PCP as previously  scheduled.   Discussed  the importance of drinking 64 ounces of water daily, dehydration of red blood cells may lead further sickling.   Avoid all stressors that precipitate sickle cell pain crisis.     The patient was given clear instructions to go to ER or return to medical center if symptoms do not improve, worsen or new problems develop.      Physical Exam at Discharge:  BP (!) 101/56 (BP Location: Right Arm)   Pulse 75   Temp 98.8 F (37.1 C) (Oral)   Resp 14   Wt 120 lb (54.4 kg)   LMP 05/04/2020   SpO2 99%   BMI 19.97 kg/m  Physical Exam Constitutional:      Appearance: Normal appearance.  HENT:     Head: Normocephalic.  Eyes:     General: No scleral icterus.    Pupils: Pupils are equal, round, and reactive to light.  Cardiovascular:     Rate and Rhythm: Normal rate and regular rhythm.     Pulses: Normal pulses.  Pulmonary:     Effort: Pulmonary effort is normal.  Abdominal:     General: Abdomen is flat. Bowel sounds are normal.  Musculoskeletal:        General: Normal range of motion.  Skin:    General: Skin is warm.  Neurological:     General: No focal deficit present.     Mental Status: She is alert. Mental status is at baseline.  Psychiatric:        Mood and Affect: Mood normal.        Behavior: Behavior normal.        Thought Content: Thought content normal.        Judgment: Judgment normal.      Disposition at Discharge: Discharge disposition: 01-Home or Self Care       Discharge Orders: Discharge Instructions    Discharge patient   Complete by: As directed    Discharge disposition: 01-Home or Self Care   Discharge patient date: 06/01/2020      Condition at Discharge:   Stable  Time spent on Discharge:  Greater than 30 minutes.  Signed: Nolon Nations  APRN, MSN, FNP-C [facility] [address] [place], [place] [address] [phone]  06/03/2020, 3:08 PM

## 2020-06-11 ENCOUNTER — Other Ambulatory Visit: Payer: Self-pay | Admitting: Family Medicine

## 2020-06-11 ENCOUNTER — Other Ambulatory Visit: Payer: Self-pay

## 2020-06-11 ENCOUNTER — Ambulatory Visit (INDEPENDENT_AMBULATORY_CARE_PROVIDER_SITE_OTHER): Payer: Medicare Other | Admitting: Family Medicine

## 2020-06-11 ENCOUNTER — Telehealth: Payer: Self-pay | Admitting: Family Medicine

## 2020-06-11 VITALS — BP 108/67 | HR 98 | Temp 97.2°F | Ht 64.0 in | Wt 117.0 lb

## 2020-06-11 DIAGNOSIS — F1190 Opioid use, unspecified, uncomplicated: Secondary | ICD-10-CM

## 2020-06-11 DIAGNOSIS — Z79899 Other long term (current) drug therapy: Secondary | ICD-10-CM

## 2020-06-11 DIAGNOSIS — G8929 Other chronic pain: Secondary | ICD-10-CM

## 2020-06-11 DIAGNOSIS — F419 Anxiety disorder, unspecified: Secondary | ICD-10-CM

## 2020-06-11 DIAGNOSIS — G4452 New daily persistent headache (NDPH): Secondary | ICD-10-CM

## 2020-06-11 DIAGNOSIS — D571 Sickle-cell disease without crisis: Secondary | ICD-10-CM

## 2020-06-11 DIAGNOSIS — Z09 Encounter for follow-up examination after completed treatment for conditions other than malignant neoplasm: Secondary | ICD-10-CM

## 2020-06-11 LAB — POCT URINALYSIS DIPSTICK
Blood, UA: NEGATIVE
Glucose, UA: NEGATIVE
Ketones, UA: NEGATIVE
Leukocytes, UA: NEGATIVE
Nitrite, UA: NEGATIVE
Protein, UA: POSITIVE — AB
Spec Grav, UA: 1.015 (ref 1.010–1.025)
Urobilinogen, UA: >=8 E.U./dL — AB
pH, UA: 7.5 (ref 5.0–8.0)

## 2020-06-11 MED ORDER — OXYCODONE HCL 15 MG PO TABS: 15.0000 mg | ORAL_TABLET | ORAL | 0 refills | Status: DC | PRN

## 2020-06-11 MED ORDER — BUTALBITAL-APAP-CAFFEINE 50-325-40 MG PO TABS: 1.0000 | ORAL_TABLET | Freq: Four times a day (QID) | ORAL | 0 refills | Status: DC | PRN
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
## 2020-06-11 NOTE — Telephone Encounter (Signed)
Refill request for oxycodone 15 IR. Please advise.

## 2020-06-11 NOTE — Telephone Encounter (Signed)
DOne

## 2020-06-14 ENCOUNTER — Ambulatory Visit: Payer: Medicare Other | Admitting: Family Medicine

## 2020-06-15 ENCOUNTER — Telehealth (HOSPITAL_COMMUNITY): Payer: Self-pay | Admitting: General Practice

## 2020-06-15 ENCOUNTER — Emergency Department (HOSPITAL_COMMUNITY)
Admission: EM | Admit: 2020-06-15 | Discharge: 2020-06-15 | Disposition: A | Discharge status: Home / Self Care | Payer: Medicare Other | Attending: Emergency Medicine | Admitting: Emergency Medicine

## 2020-06-15 ENCOUNTER — Emergency Department (HOSPITAL_COMMUNITY): Payer: Medicare Other

## 2020-06-15 ENCOUNTER — Other Ambulatory Visit: Payer: Self-pay

## 2020-06-15 DIAGNOSIS — I272 Pulmonary hypertension, unspecified: Secondary | ICD-10-CM | POA: Insufficient documentation

## 2020-06-15 DIAGNOSIS — G894 Chronic pain syndrome: Secondary | ICD-10-CM | POA: Insufficient documentation

## 2020-06-15 DIAGNOSIS — D5700 Hb-SS disease with crisis, unspecified: Secondary | ICD-10-CM

## 2020-06-15 DIAGNOSIS — Z20822 Contact with and (suspected) exposure to covid-19: Secondary | ICD-10-CM | POA: Diagnosis not present

## 2020-06-15 DIAGNOSIS — F17200 Nicotine dependence, unspecified, uncomplicated: Secondary | ICD-10-CM | POA: Insufficient documentation

## 2020-06-15 DIAGNOSIS — R05 Cough: Secondary | ICD-10-CM | POA: Diagnosis not present

## 2020-06-15 DIAGNOSIS — D57219 Sickle-cell/Hb-C disease with crisis, unspecified: Secondary | ICD-10-CM | POA: Insufficient documentation

## 2020-06-15 DIAGNOSIS — Z885 Allergy status to narcotic agent status: Secondary | ICD-10-CM | POA: Diagnosis not present

## 2020-06-15 DIAGNOSIS — Z79899 Other long term (current) drug therapy: Secondary | ICD-10-CM | POA: Insufficient documentation

## 2020-06-15 LAB — RETICULOCYTES
Immature Retic Fract: 35.5 % — ABNORMAL HIGH (ref 2.3–15.9)
RBC.: 2.45 MIL/uL — ABNORMAL LOW (ref 3.87–5.11)
Retic Count, Absolute: 583 10*3/uL — ABNORMAL HIGH (ref 19.0–186.0)
Retic Ct Pct: 23.8 % — ABNORMAL HIGH (ref 0.4–3.1)

## 2020-06-15 LAB — CBC WITH DIFFERENTIAL/PLATELET
Abs Immature Granulocytes: 0.22 10*3/uL — ABNORMAL HIGH (ref 0.00–0.07)
Basophils Absolute: 0.1 10*3/uL (ref 0.0–0.1)
Basophils Relative: 1 %
Eosinophils Absolute: 0.4 10*3/uL (ref 0.0–0.5)
Eosinophils Relative: 2 %
HCT: 24.5 % — ABNORMAL LOW (ref 36.0–46.0)
Hemoglobin: 8.4 g/dL — ABNORMAL LOW (ref 12.0–15.0)
Immature Granulocytes: 1 %
Lymphocytes Relative: 49 %
Lymphs Abs: 8.3 10*3/uL — ABNORMAL HIGH (ref 0.7–4.0)
MCH: 34.3 pg — ABNORMAL HIGH (ref 26.0–34.0)
MCHC: 34.3 g/dL (ref 30.0–36.0)
MCV: 100 fL (ref 80.0–100.0)
Monocytes Absolute: 1.2 10*3/uL — ABNORMAL HIGH (ref 0.1–1.0)
Monocytes Relative: 7 %
Neutro Abs: 6.8 10*3/uL (ref 1.7–7.7)
Neutrophils Relative %: 40 %
Platelets: 324 10*3/uL (ref 150–400)
RBC: 2.45 MIL/uL — ABNORMAL LOW (ref 3.87–5.11)
RDW: 24.5 % — ABNORMAL HIGH (ref 11.5–15.5)
WBC: 16.9 10*3/uL — ABNORMAL HIGH (ref 4.0–10.5)
nRBC: 2 % — ABNORMAL HIGH (ref 0.0–0.2)

## 2020-06-15 LAB — COMPREHENSIVE METABOLIC PANEL
ALT: 13 U/L (ref 0–44)
AST: 29 U/L (ref 15–41)
Albumin: 4.2 g/dL (ref 3.5–5.0)
Alkaline Phosphatase: 59 U/L (ref 38–126)
Anion gap: 13 (ref 5–15)
BUN: <5 mg/dL — ABNORMAL LOW (ref 6–20)
CO2: 26 mmol/L (ref 22–32)
Calcium: 9.1 mg/dL (ref 8.9–10.3)
Chloride: 99 mmol/L (ref 98–111)
Creatinine, Ser: 0.38 mg/dL — ABNORMAL LOW (ref 0.44–1.00)
GFR calc Af Amer: >60 mL/min (ref >60)
GFR calc non Af Amer: >60 mL/min (ref >60)
Glucose, Bld: 102 mg/dL — ABNORMAL HIGH (ref 70–99)
Potassium: 3.5 mmol/L (ref 3.5–5.1)
Sodium: 138 mmol/L (ref 135–145)
Total Bilirubin: 3.5 mg/dL — ABNORMAL HIGH (ref 0.3–1.2)
Total Protein: 7.9 g/dL (ref 6.5–8.1)

## 2020-06-15 LAB — SARS CORONAVIRUS 2 BY RT PCR (HOSPITAL ORDER, PERFORMED IN ~~LOC~~ HOSPITAL LAB): SARS Coronavirus 2: NEGATIVE

## 2020-06-15 LAB — I-STAT BETA HCG BLOOD, ED (MC, WL, AP ONLY): I-stat hCG, quantitative: <5 m[IU]/mL (ref <5)

## 2020-06-15 MED ORDER — HYDROMORPHONE HCL 1 MG/ML IJ SOLN: 1.0000 mg | Freq: Once | INTRAMUSCULAR | Status: DC

## 2020-06-15 MED ORDER — HYDROMORPHONE HCL 2 MG/ML IJ SOLN
2.0000 mg | INTRAMUSCULAR | Status: AC
  Administered 2020-06-15: 2 mg via INTRAVENOUS
  Filled 2020-06-15: qty 1

## 2020-06-15 MED ORDER — OXYCODONE HCL 5 MG PO TABS
15.0000 mg | ORAL_TABLET | Freq: Once | ORAL | Status: AC
  Administered 2020-06-15: 15 mg via ORAL
  Filled 2020-06-15: qty 3

## 2020-06-15 MED ORDER — KETOROLAC TROMETHAMINE 15 MG/ML IJ SOLN
15.0000 mg | INTRAMUSCULAR | Status: AC
  Administered 2020-06-15: 15 mg via INTRAVENOUS
  Filled 2020-06-15: qty 1

## 2020-06-15 MED ORDER — DEXTROSE-NACL 5-0.45 % IV SOLN: INTRAVENOUS | Status: DC

## 2020-06-15 MED ORDER — SODIUM CHLORIDE 0.9% FLUSH: 3.0000 mL | Freq: Once | INTRAVENOUS | Status: DC

## 2020-06-15 MED ORDER — SODIUM CHLORIDE 0.9 % IV BOLUS
1000.0000 mL | Freq: Once | INTRAVENOUS | Status: AC
  Administered 2020-06-15: 1000 mL via INTRAVENOUS

## 2020-06-15 NOTE — ED Provider Notes (Signed)
[facility]-EMERGENCY DEPT Provider Note   CSN: [id number] Arrival date & time: 06/15/20  1121     History Chief Complaint  Patient presents with  . Sickle Cell Pain Crisis    Kara [name] is a 34 y.o. female with pertinent past medical history of sickle cell anemia with previous history of sickle cell crisis that presents the emergency department today for sickle cell crisis.  Patient states that she normally call sickle cell center when she has crisis, they told her to come here today because she is presenting with cough.  Patient states that 3 days ago she started having myalgias and arthralgias in her normal pattern of sickle cell crisis, states that the pain has been increasing over the past 3 days.  States that she also had cough with feeling warm 3 days ago.  States that she was around sick children.  Has not gotten Covid vaccines.  Denies any rash, sore throat, congestion, allergies, chest pain, shortness of breath, fevers, chills.  No productive cough, no hemoptysis.  States that the pain started on her entire right side of her body, is now on her left side.  States that this is how her sickle cell crisis progresses.  States that she took her breakthrough Percocet and OxyContin without much relief.  Denies any abdominal pain, nausea, vomiting.  Patient states that pain is currently 7/10.  Denies any paresthesias, weakness, gait abnormality, headache, vision changes. Marland KitchenHPI     Past Medical History:  Diagnosis Date  . Arthritis   . Chronic migraine   . GERD (gastroesophageal reflux disease)   . Hx of cholecystectomy 2015  . Pulmonary hypertension (HCC)   . Sickle cell anemia (HCC)   . Tendinitis    left elbow    Patient Active Problem List   Diagnosis Date Noted  . Abscess of right buttock 04/17/2020  . 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 Sickle cell anemia with pain (HCC) 12/08/2019  . Decreased appetite 08/28/2019  . Chronic pain of right knee  08/10/19  . Gastroesophageal reflux disease without esophagitis 10-Aug-2019  . Anxiety Aug 10, 2019  . Death of family member 08-10-19  . Chronic, continuous use of opioids 08/10/19  . Headache, new daily persistent ([facility]) 07/04/2019  . Sickle cell anemia (HCC) 07/01/2019  . Chronic pain syndrome 12/27/2018  . Anemia of chronic disease 12/27/2018  . Pulmonary hypertension (HCC) 04/04/2018  . Sickle cell disease (HCC) 04/04/2018  . Arthritis 04/04/2018  . Chest pain on breathing   . Leukocytosis 02/06/2018  . Sickle cell crisis (HCC) 02/05/2018  . Sickle cell pain crisis (HCC) 10/29/2017    Past Surgical History:  Procedure Laterality Date  . DILATION AND CURETTAGE OF UTERUS    . GALLBLADDER SURGERY       OB History    Gravida  1   Para      Term      Preterm      AB  1   Living        SAB  1   TAB      Ectopic      Multiple      Live Births              Family History  Adopted: Yes    Social History   Tobacco Use  . Smoking status: Current Every Day Smoker  . Smokeless tobacco: Never Used  Vaping Use  . Vaping Use: Never used  Substance Use Topics  .  Alcohol use: No  . Drug use: Yes    Types: Marijuana    Home Medications Prior to Admission medications   Medication Sig Start Date End Date Taking? Authorizing Provider  Benzocaine 20 % OINT Apply 1 application topically 4 (four) times daily as needed. Patient not taking: Reported on 04/13/2020 04/02/20   Barbette Merino, NP  butalbital-acetaminophen-caffeine [facility]) [phone] MG tablet Take 1 tablet by mouth every 6 (six) hours as needed for headache. 06/11/20   [name], FNP  cetirizine (ZYRTEC) 10 MG tablet Take 1 tablet (10 mg total) by mouth daily. 04/14/20   [name], FNP  Ensure Plus (ENSURE PLUS) LIQD Take 237 mLs by mouth 3 (three) times daily between meals. 08/26/19   [name], FNP  fluticasone (FLONASE) 50 MCG/ACT nasal spray Place 2 sprays into both  nostrils daily. 02/20/19   Menshew, Charlesetta Ivory, PA-C  folic acid (FOLVITE) 1 MG tablet Take 1 tablet (1 mg total) by mouth daily. 04/14/20   [name], FNP  hydrOXYzine (ATARAX/VISTARIL) 10 MG tablet Take 10 mg by mouth 2 (two) times daily. 02/20/20   [provider]  ibuprofen (ADVIL) 200 MG tablet Take 800 mg by mouth every 6 (six) hours as needed for moderate pain.    [provider]  methocarbamol (ROBAXIN) 500 MG tablet Take 1 tablet (500 mg total) by mouth in the morning and at bedtime. 04/14/20   [name], FNP  naproxen (NAPROSYN) 500 MG tablet Take 1 tablet (500 mg total) by mouth 2 (two) times daily with a meal. 07/14/19   [name], FNP  omeprazole (PRILOSEC) 40 MG capsule Take 1 capsule (40 mg total) by mouth daily. 04/14/20   [name], FNP  oxyCODONE (OXYCONTIN) 10 mg 12 hr tablet Take 1 tablet (10 mg total) by mouth every 8 (eight) hours as needed. 05/28/20 06/27/20  [name], FNP  oxyCODONE (ROXICODONE) 15 MG immediate release tablet Take 1 tablet (15 mg total) by mouth every 4 (four) hours as needed for up to 15 days. 06/11/20 06/26/20  [name], FNP  Vitamin D, Ergocalciferol, (DRISDOL) 1.25 MG (50000 UNIT) CAPS capsule Take 1 capsule (50,000 Units total) by mouth every 7 (seven) days. 04/14/20   [name], FNP    Allergies    Kiwi extract, Morphine and related, and Paroxetine  Review of Systems   Review of Systems  Constitutional: Negative for chills, diaphoresis, fatigue and fever.  HENT: Negative for congestion, sore throat and trouble swallowing.   Eyes: Negative for pain and visual disturbance.  Respiratory: Positive for cough. Negative for shortness of breath and wheezing.   Cardiovascular: Negative for chest pain, palpitations and leg swelling.  Gastrointestinal: Negative for abdominal distention, abdominal pain, diarrhea, nausea and vomiting.  Genitourinary: Negative for difficulty urinating.    Musculoskeletal: Positive for arthralgias and myalgias. Negative for back pain, neck pain and neck stiffness.  Skin: Negative for pallor.  Neurological: Negative for dizziness, speech difficulty, weakness and headaches.  Psychiatric/Behavioral: Negative for confusion.    Physical Exam Updated Vital Signs BP (!) 114/62   Pulse 84   Temp 98.9 F (37.2 C)   Resp 16   Ht 5\' 4"  (1.626 m)   Wt 53.1 kg   LMP 06/07/2020 (Exact Date)   SpO2 90%   BMI 20.08 kg/m   Physical Exam Constitutional:      General: She is not in acute distress.    Appearance:  Normal appearance. She is not ill-appearing, toxic-appearing or diaphoretic.  HENT:     Mouth/Throat:     Mouth: Mucous membranes are moist.     Pharynx: Oropharynx is clear.  Eyes:     General: No scleral icterus.    Extraocular Movements: Extraocular movements intact.     Pupils: Pupils are equal, round, and reactive to light.  Cardiovascular:     Rate and Rhythm: Normal rate and regular rhythm.     Pulses: Normal pulses.     Heart sounds: Normal heart sounds.  Pulmonary:     Effort: Pulmonary effort is normal. No respiratory distress.     Breath sounds: Normal breath sounds. No stridor. No wheezing, rhonchi or rales.  Chest:     Chest wall: No tenderness.  Abdominal:     General: Abdomen is flat. There is no distension.     Palpations: Abdomen is soft.     Tenderness: There is no abdominal tenderness. There is no guarding or rebound.  Musculoskeletal:        General: No swelling or tenderness. Normal range of motion.     Cervical back: Normal range of motion and neck supple. No rigidity.     Right lower leg: No edema.     Left lower leg: No edema.     Comments: Tenderness to right foot, right leg, right thigh, left arm. Able to range all extremities without any pain.  Normal sensation throughout. No erythema or warmth.No rashes Normal strength to all extremities upper and lower bilateral.  Radial pulse 2+ bilaterally.  PT  pulse 2+ bilaterally.  Able to extend legs.  Soft compartments.  Normal gait.  Skin:    General: Skin is warm and dry.     Capillary Refill: Capillary refill takes less than 2 seconds.     Coloration: Skin is not pale.  Neurological:     General: No focal deficit present.     Mental Status: She is alert and oriented to person, place, and time.     Cranial Nerves: No cranial nerve deficit.     Sensory: No sensory deficit.     Motor: No weakness.     Coordination: Coordination normal.     Gait: Gait normal.  Psychiatric:        Mood and Affect: Mood normal.        Behavior: Behavior normal.     ED Results / Procedures / Treatments   Labs (all labs ordered are listed, but only abnormal results are displayed) Labs Reviewed  COMPREHENSIVE METABOLIC PANEL - Abnormal; Notable for the following components:      Result Value   Glucose, Bld 102 (*)    BUN <5 (*)    Creatinine, Ser 0.38 (*)    Total Bilirubin 3.5 (*)    All other components within normal limits  CBC WITH DIFFERENTIAL/PLATELET - Abnormal; Notable for the following components:   WBC 16.9 (*)    RBC 2.45 (*)    Hemoglobin 8.4 (*)    HCT 24.5 (*)    MCH 34.3 (*)    RDW 24.5 (*)    nRBC 2.0 (*)    Lymphs Abs 8.3 (*)    Monocytes Absolute 1.2 (*)    Abs Immature Granulocytes 0.22 (*)    All other components within normal limits  RETICULOCYTES - Abnormal; Notable for the following components:   Retic Ct Pct 23.8 (*)    RBC. 2.45 (*)    Retic Count, Absolute 583.0 (*)  Immature Retic Fract 35.5 (*)    All other components within normal limits  SARS CORONAVIRUS 2 BY RT PCR (HOSPITAL ORDER, PERFORMED IN [facility])  I-STAT BETA HCG BLOOD, ED ([facility], [facility], [facility] ONLY)    EKG None  Radiology DG Chest Port 1 View  Result Date: 06/15/2020 CLINICAL DATA:  34 year old female with history of cough and pain in the entire right-side of the body. EXAM: PORTABLE CHEST 1 VIEW COMPARISON:  No priors. FINDINGS: Lung  volumes are normal. No consolidative airspace disease. No pleural effusions. No pneumothorax. No definite suspicious appearing pulmonary nodules or masses are noted. Heart size is mildly enlarged. Upper mediastinal contours are within normal limits. IMPRESSION: 1. No radiographic evidence of acute cardiopulmonary disease. 2. Mild cardiomegaly. Electronically Signed   By: Trudie Reedaniel  Entrikin M.D.   On: 06/15/2020 19:20    Procedures Procedures (including critical care time)  Medications Ordered in ED Medications  dextrose 5 %-0.45 % sodium chloride infusion ( Intravenous Stopped 06/15/20 2053)  ketorolac (TORADOL) 15 MG/ML injection 15 mg (has no administration in time range)  HYDROmorphone (DILAUDID) injection 2 mg (2 mg Intravenous Given 06/15/20 1830)  HYDROmorphone (DILAUDID) injection 2 mg (2 mg Intravenous Given 06/15/20 1901)  oxyCODONE (Oxy IR/ROXICODONE) immediate release tablet 15 mg (15 mg Oral Given 06/15/20 2052)  ketorolac (TORADOL) 15 MG/ML injection 15 mg (15 mg Intravenous Given 06/15/20 1830)  sodium chloride 0.9 % bolus 1,000 mL (0 mLs Intravenous Stopped 06/15/20 2053)    ED Course  I have reviewed the triage vital signs and the nursing notes.  Pertinent labs & imaging results that were available during my care of the patient were reviewed by me and considered in my medical decision making (see chart for details).    MDM Rules/Calculators/A&P                         Kara [name] is a 34 y.o. female with pertinent past medical history of sickle cell anemia with previous history of sickle cell crisis that presents the emergency department today for sickle cell crisis.  Patient denies any chest pain, shortness of breath.  Patient is having pain in normal sickle cell distribution.  No concerns for acute chest syndrome or aplastic crisis.  Myalgias with normal range of motion to all extremities, no signs of septic joint.  Normal neuro exam, normal gait.  No fevers, chills,  paresthesias, weakness.  CBC and CMP without any acute abnormalities.  Leukocytosis and anemia at baseline.  Covid swab pending.  Chest x-ray without any acute cardiopulmonary disease.  EKG without any signs of ischemia or arrhythmia. After reassessment patient states that pain has improved. Shared decision making, patient does not want to come into the hospital today for further treatment.  Patient states that she will see PCP tomorrow.  Expressed that she can find Covid results on MyChart.  Patient states that she does not want to wait for these at this time.  Doubt need for further emergent work up at this time. I explained the diagnosis and have given explicit precautions to return to the ER including for any other new or worsening symptoms. The patient understands and accepts the medical plan as it's been dictated and I have answered their questions. Discharge instructions concerning home care and prescriptions have been given. The patient is STABLE and is discharged to home in good condition.   Final Clinical Impression(s) / ED Diagnoses Final diagnoses:  Sickle cell  pain crisis [facility])    Rx / DC Orders ED Discharge Orders    None       Farrel Gordon, PA-C 06/15/20 2120    Alvira Monday, MD 06/16/20 [phone]

## 2020-06-15 NOTE — ED Triage Notes (Signed)
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 Patient denies N/V/D. Denies chest pain or SOB

## 2020-06-15 NOTE — ED Notes (Signed)
This Clinical research associate assisted patient back from the restroom. Pushed patient in a chair. Pt stated she was feeling weak. Nurse advised patient not to walk back to room.

## 2020-06-15 NOTE — Discharge Instructions (Addendum)
You were seen today for sickle cell pain crisis, I want you to follow-up with your primary care tomorrow since you do not want to be admitted into the ER.  Keep taking your breakthrough pain medications as prescribed.  Your Covid results will be on my chart.  Use the attached instructions.

## 2020-06-15 NOTE — Telephone Encounter (Signed)
Patient called, requesting to come to the day hospital due to pain starting in the "foot, up the leg to the buttocks to my private part" on the right side of the body rated at 8/10. Denied chest pain, fever, diarrhea, abdominal pain, nausea/vomitting. Endorsed "warm" body and cough with phlegm that started three days ago.  Admitted to having means of transportation without driving self after treatment. Last took 30 mg of oxycodone and 20 mg of oxycontin at 05:00 am today. Per provider, patient should go to the ER to rule out other illnesses. Patient told to continue to take pain medications as prescribed. Patient also asked to set up an appointment with the primary care provider as early as tomorrow with Colonel Bald. FNP. Patient notified, verbalized understanding.

## 2020-06-22 LAB — DRUG SCREEN 764883 11+OXYCO+ALC+CRT-BUND
Amphetamines, Urine: NEGATIVE ng/mL
BENZODIAZ UR QL: NEGATIVE ng/mL
Barbiturate: NEGATIVE ng/mL
Creatinine: 104.2 mg/dL (ref 20.0–300.0)
Ethanol: NEGATIVE %
Meperidine: NEGATIVE ng/mL
Methadone Screen, Urine: NEGATIVE ng/mL
OPIATE SCREEN URINE: NEGATIVE ng/mL
Phencyclidine: NEGATIVE ng/mL
Propoxyphene: NEGATIVE ng/mL
Tramadol Ur: NEGATIVE ng/mL
pH, Urine: 7.7 (ref 4.5–8.9)

## 2020-06-22 LAB — COCAINE CONF, UR
Benzoylecgonine GC/MS Conf: 651 ng/mL
Cocaine Metab Quant, Ur: POSITIVE — AB

## 2020-06-22 LAB — OXYCODONE/OXYMORPHONE, CONFIRM
OXYCODONE/OXYMORPH: POSITIVE — AB
OXYCODONE: 496 ng/mL
OXYCODONE: POSITIVE — AB
OXYMORPHONE (GC/MS): 1202 ng/mL
OXYMORPHONE: POSITIVE — AB

## 2020-06-22 LAB — CANNABINOID CONFIRMATION, UR
CANNABINOIDS: POSITIVE — AB
Carboxy THC GC/MS Conf: 55 ng/mL

## 2020-06-23 ENCOUNTER — Telehealth: Payer: Self-pay | Admitting: Family Medicine

## 2020-06-25 ENCOUNTER — Other Ambulatory Visit: Payer: Self-pay | Admitting: Family Medicine

## 2020-06-25 DIAGNOSIS — R825 Elevated urine levels of drugs, medicaments and biological substances: Secondary | ICD-10-CM

## 2020-06-28 ENCOUNTER — Ambulatory Visit: Payer: Medicare Other

## 2020-06-28 ENCOUNTER — Other Ambulatory Visit: Payer: Self-pay

## 2020-06-28 DIAGNOSIS — R825 Elevated urine levels of drugs, medicaments and biological substances: Secondary | ICD-10-CM

## 2020-06-28 NOTE — Telephone Encounter (Signed)
This has been resolved

## 2020-07-06 LAB — DRUG SCREEN 764883 11+OXYCO+ALC+CRT-BUND
Amphetamines, Urine: NEGATIVE ng/mL
BENZODIAZ UR QL: NEGATIVE ng/mL
Barbiturate: NEGATIVE ng/mL
Cocaine (Metabolite): NEGATIVE ng/mL
Creatinine: 114.7 mg/dL (ref 20.0–300.0)
Ethanol: NEGATIVE %
Meperidine: NEGATIVE ng/mL
Methadone Screen, Urine: NEGATIVE ng/mL
OPIATE SCREEN URINE: NEGATIVE ng/mL
Phencyclidine: NEGATIVE ng/mL
Propoxyphene: NEGATIVE ng/mL
Tramadol: NEGATIVE ng/mL
pH, Urine: 6.9 (ref 4.5–8.9)

## 2020-07-06 LAB — OXYCODONE/OXYMORPHONE, CONFIRM
OXYCODONE/OXYMORPH: POSITIVE — AB
OXYCODONE: NEGATIVE
OXYMORPHONE (GC/MS): 1186 ng/mL
OXYMORPHONE: POSITIVE — AB

## 2020-07-06 LAB — CANNABINOID CONFIRMATION, UR: CANNABINOIDS: NEGATIVE

## 2020-07-12 ENCOUNTER — Other Ambulatory Visit: Payer: Self-pay

## 2020-07-12 ENCOUNTER — Ambulatory Visit: Payer: Medicare Other

## 2020-07-12 DIAGNOSIS — R825 Elevated urine levels of drugs, medicaments and biological substances: Secondary | ICD-10-CM

## 2020-07-14 ENCOUNTER — Encounter: Payer: Self-pay | Admitting: Family Medicine

## 2020-07-19 ENCOUNTER — Telehealth (INDEPENDENT_AMBULATORY_CARE_PROVIDER_SITE_OTHER): Payer: Medicare Other | Admitting: Family Medicine

## 2020-07-19 DIAGNOSIS — N390 Urinary tract infection, site not specified: Secondary | ICD-10-CM

## 2020-07-19 DIAGNOSIS — G8929 Other chronic pain: Secondary | ICD-10-CM | POA: Diagnosis not present

## 2020-07-19 DIAGNOSIS — Z09 Encounter for follow-up examination after completed treatment for conditions other than malignant neoplasm: Secondary | ICD-10-CM

## 2020-07-19 DIAGNOSIS — D571 Sickle-cell disease without crisis: Secondary | ICD-10-CM | POA: Diagnosis not present

## 2020-07-19 DIAGNOSIS — F419 Anxiety disorder, unspecified: Secondary | ICD-10-CM

## 2020-07-19 DIAGNOSIS — F1190 Opioid use, unspecified, uncomplicated: Secondary | ICD-10-CM

## 2020-07-19 LAB — DRUG SCREEN 764883 11+OXYCO+ALC+CRT-BUND
Amphetamines, Urine: NEGATIVE ng/mL
BENZODIAZ UR QL: NEGATIVE ng/mL
Barbiturate: NEGATIVE ng/mL
Cocaine (Metabolite): NEGATIVE ng/mL
Creatinine: 59.2 mg/dL (ref 20.0–300.0)
Ethanol: NEGATIVE %
Meperidine: NEGATIVE ng/mL
Methadone Screen, Urine: NEGATIVE ng/mL
OPIATE SCREEN URINE: NEGATIVE ng/mL
Phencyclidine: NEGATIVE ng/mL
Propoxyphene: NEGATIVE ng/mL
Tramadol: NEGATIVE ng/mL
pH, Urine: 8.2 (ref 4.5–8.9)

## 2020-07-19 LAB — OXYCODONE/OXYMORPHONE, CONFIRM
OXYCODONE/OXYMORPH: POSITIVE — AB
OXYCODONE: NEGATIVE
OXYMORPHONE (GC/MS): 457 ng/mL
OXYMORPHONE: POSITIVE — AB

## 2020-07-19 LAB — CANNABINOID CONFIRMATION, UR: CANNABINOIDS: NEGATIVE

## 2020-07-19 MED ORDER — OXYCODONE HCL 15 MG PO TABS: 15.0000 mg | ORAL_TABLET | ORAL | 0 refills | Status: DC | PRN

## 2020-07-19 MED ORDER — OXYCODONE HCL ER 10 MG PO T12A: 10.0000 mg | EXTENDED_RELEASE_TABLET | Freq: Three times a day (TID) | ORAL | 0 refills | Status: DC | PRN

## 2020-07-19 MED ORDER — CEPHALEXIN 500 MG PO CAPS: 500.0000 mg | ORAL_CAPSULE | Freq: Two times a day (BID) | ORAL | 0 refills | Status: AC

## 2020-07-19 NOTE — Progress Notes (Signed)
Virtual Visit via Telephone Note  I connected with Kara [name] on 07/22/20 at  3:00 PM EDT by telephone and verified that I am speaking with the correct person using two identifiers.   I discussed the limitations, risks, security and privacy concerns of performing an evaluation and management service by telephone and the availability of in person appointments. I also discussed with the patient that there may be a patient responsible charge related to this service. The patient expressed understanding and agreed to proceed.   Televisit Today Patient Location: Home Provider Location: Office   History of Present Illness:  Past Surgical History:  Procedure Laterality Date  . DILATION AND CURETTAGE OF UTERUS    . GALLBLADDER SURGERY      Social History   Socioeconomic History  . Marital status: Single    Spouse name: Not on file  . Number of children: Not on file  . Years of education: Not on file  . Highest education level: Not on file  Occupational History  . Not on file  Tobacco Use  . Smoking status: Current Every Day Smoker  . Smokeless tobacco: Never Used  Vaping Use  . Vaping Use: Never used  Substance and Sexual Activity  . Alcohol use: No  . Drug use: Yes    Types: Marijuana  . Sexual activity: Yes    Birth control/protection: None  Other Topics Concern  . Not on file  Social History Narrative  . Not on file   Social Determinants of Health   Financial Resource Strain:   . Difficulty of Paying Living Expenses: Not on file  Food Insecurity:   . Worried About Programme researcher, broadcasting/film/video in the Last Year: Not on file  . Ran Out of Food in the Last Year: Not on file  Transportation Needs:   . Lack of Transportation (Medical): Not on file  . Lack of Transportation (Non-Medical): Not on file  Physical Activity:   . Days of Exercise per Week: Not on file  . Minutes of Exercise per Session: Not on file  Stress:   . Feeling of Stress : Not on file  Social  Connections:   . Frequency of Communication with Friends and Family: Not on file  . Frequency of Social Gatherings with Friends and Family: Not on file  . Attends Religious Services: Not on file  . Active Member of Clubs or Organizations: Not on file  . Attends Banker Meetings: Not on file  . Marital Status: Not on file  Intimate Partner Violence:   . Fear of Current or Ex-Partner: Not on file  . Emotionally Abused: Not on file  . Physically Abused: Not on file  . Sexually Abused: Not on file    Family History  Adopted: Yes    Past Medical History:  Diagnosis Date  . Arthritis   . Chronic migraine   . GERD (gastroesophageal reflux disease)   . Hx of cholecystectomy 2015  . Pulmonary hypertension (HCC)   . Sickle cell anemia (HCC)   . Tendinitis    left elbow    Allergies  Allergen Reactions  . Kiwi Extract Hives  . Morphine And Related Hives  . Paroxetine Hives    Current Outpatient Medications on File Prior to Visit  Medication Sig Dispense Refill  . butalbital-acetaminophen-caffeine (FIORICET) 50-325-40 MG tablet Take 1 tablet by mouth every 6 (six) hours as needed for headache. 14 tablet 0  . cetirizine (ZYRTEC) 10 MG tablet Take 1 tablet (  10 mg total) by mouth daily. 30 tablet 11  . Ensure Plus (ENSURE PLUS) LIQD Take 237 mLs by mouth 3 (three) times daily between meals. 237 mL 6  . folic acid (FOLVITE) 1 MG tablet Take 1 tablet (1 mg total) by mouth daily. 30 tablet 11  . methocarbamol (ROBAXIN) 500 MG tablet Take 1 tablet (500 mg total) by mouth in the morning and at bedtime. 60 tablet 6  . naproxen (NAPROSYN) 500 MG tablet Take 1 tablet (500 mg total) by mouth 2 (two) times daily with a meal. 60 tablet 3  . omeprazole (PRILOSEC) 40 MG capsule Take 1 capsule (40 mg total) by mouth daily. 30 capsule 11  . Vitamin D, Ergocalciferol, (DRISDOL) 1.25 MG (50000 UNIT) CAPS capsule Take 1 capsule (50,000 Units total) by mouth every 7 (seven) days. 5 capsule 2   . Benzocaine 20 % OINT Apply 1 application topically 4 (four) times daily as needed. (Patient not taking: Reported on 04/13/2020) 28 g 2  . fluticasone (FLONASE) 50 MCG/ACT nasal spray Place 2 sprays into both nostrils daily. (Patient not taking: Reported on 07/19/2020) 16 g 0  . hydrOXYzine (ATARAX/VISTARIL) 10 MG tablet Take 10 mg by mouth 2 (two) times daily. (Patient not taking: Reported on 07/19/2020)    . ibuprofen (ADVIL) 200 MG tablet Take 800 mg by mouth every 6 (six) hours as needed for moderate pain. (Patient not taking: Reported on 07/19/2020)     No current facility-administered medications on file prior to visit.    Current Status: Since her last office visit, she is doing well with no complaints. She states that She has pain in her arms and legs. She rates her pain today at 5/10. She has not had a hospital visit for Sickle Cell Crisis since 06/15/2020 where she was treated and discharged the same day. She is currently taking all medications as prescribed and staying well hydrated. She reports occasional nausea, constipation, dizziness and headaches. She denies fevers, chills, fatigue, recent infections, weight loss, and night sweats. She has not had any visual changes, and falls. No chest pain, heart palpitations, cough and shortness of breath reported. Denies GI problems such as vomiting, diarrhea, and constipation. She has no reports of blood in stools, dysuria and hematuria. Her anxiety is stable today. She denies suicidal ideations, homicidal ideations, or auditory hallucinations. She is taking all medications as prescribed.   Observations/Objective:  Telephone Visit   Assessment and Plan:  1. Hb-SS disease without crisis [facility]) She is doing well today r/t her chronic pain management. She will continue to take pain medications as prescribed; will continue to avoid extreme heat and cold; will continue to eat a healthy diet and drink at least 64 ounces of water daily; continue stool  softener as needed; will avoid colds and flu; will continue to get plenty of sleep and rest; will continue to avoid high stressful situations and remain infection free; will continue Folic Acid 1 mg daily to avoid sickle cell crisis. Continue to follow up with Hematologist as needed.  - oxyCODONE (OXYCONTIN) 10 mg 12 hr tablet; Take 1 tablet (10 mg total) by mouth every 8 (eight) hours as needed.  Dispense: 90 tablet; Refill: 0 - oxyCODONE (ROXICODONE) 15 MG immediate release tablet; Take 1 tablet (15 mg total) by mouth every 4 (four) hours as needed for up to 15 days.  Dispense: 90 tablet; Refill: 0  2. Other chronic pain - oxyCODONE (OXYCONTIN) 10 mg 12 hr tablet; Take 1 tablet (10  mg total) by mouth every 8 (eight) hours as needed.  Dispense: 90 tablet; Refill: 0 - oxyCODONE (ROXICODONE) 15 MG immediate release tablet; Take 1 tablet (15 mg total) by mouth every 4 (four) hours as needed for up to 15 days.  Dispense: 90 tablet; Refill: 0  3. Chronic, continuous use of opioids - oxyCODONE (OXYCONTIN) 10 mg 12 hr tablet; Take 1 tablet (10 mg total) by mouth every 8 (eight) hours as needed.  Dispense: 90 tablet; Refill: 0 - oxyCODONE (ROXICODONE) 15 MG immediate release tablet; Take 1 tablet (15 mg total) by mouth every 4 (four) hours as needed for up to 15 days.  Dispense: 90 tablet; Refill: 0  4. Urinary tract infection without hematuria, site unspecified - cephALEXin (KEFLEX) 500 MG capsule; Take 1 capsule (500 mg total) by mouth 2 (two) times daily for 7 days.  Dispense: 14 capsule; Refill: 0  5. Anxiety Stable today.   6. Follow up She will follow up in 2 months. .  Meds ordered this encounter  Medications  . cephALEXin (KEFLEX) 500 MG capsule    Sig: Take 1 capsule (500 mg total) by mouth 2 (two) times daily for 7 days.    Dispense:  14 capsule    Refill:  0  . oxyCODONE (OXYCONTIN) 10 mg 12 hr tablet    Sig: Take 1 tablet (10 mg total) by mouth every 8 (eight) hours as needed.     Dispense:  90 tablet    Refill:  0    Dose frequency change on 05/28/2020. Thank you.    Order Specific Question:   Supervising Provider    Answer:   Kara Angst [id number]  . oxyCODONE (ROXICODONE) 15 MG immediate release tablet    Sig: Take 1 tablet (15 mg total) by mouth every 4 (four) hours as needed for up to 15 days.    Dispense:  90 tablet    Refill:  0    Order Specific Question:   Supervising Provider    Answer:   Kara Angst [id number]    No orders of the defined types were placed in this encounter.   Referral Orders  No referral(s) requested today    Raliegh Ip,  MSN, FNP-BC [facility]/Internal Medicine/Sickle Cell Center [facility] [address] [place], [place] [address] [phone] fax    I discussed the assessment and treatment plan with the patient. The patient was provided an opportunity to ask questions and all were answered. The patient agreed with the plan and demonstrated an understanding of the instructions.   The patient was advised to call back or seek an in-person evaluation if the symptoms worsen or if the condition fails to improve as anticipated.  I provided 20 minutes of non-face-to-face time during this encounter.   [name], FNP

## 2020-07-22 ENCOUNTER — Encounter: Payer: Self-pay | Admitting: Family Medicine

## 2020-07-28 ENCOUNTER — Other Ambulatory Visit: Payer: Self-pay

## 2020-07-28 ENCOUNTER — Telehealth (HOSPITAL_COMMUNITY): Payer: Self-pay

## 2020-07-28 ENCOUNTER — Encounter (HOSPITAL_COMMUNITY): Payer: Self-pay | Admitting: Internal Medicine

## 2020-07-28 ENCOUNTER — Inpatient Hospital Stay (HOSPITAL_COMMUNITY)
Admission: AD | Admit: 2020-07-28 | Discharge: 2020-07-30 | DRG: 812 | Disposition: A | Discharge status: Home / Self Care | Payer: Medicare Other | Source: Ambulatory Visit | Attending: Internal Medicine | Admitting: Internal Medicine

## 2020-07-28 DIAGNOSIS — D72829 Elevated white blood cell count, unspecified: Secondary | ICD-10-CM | POA: Diagnosis present

## 2020-07-28 DIAGNOSIS — D5700 Hb-SS disease with crisis, unspecified: Principal | ICD-10-CM | POA: Diagnosis present

## 2020-07-28 DIAGNOSIS — K219 Gastro-esophageal reflux disease without esophagitis: Secondary | ICD-10-CM | POA: Diagnosis present

## 2020-07-28 DIAGNOSIS — Z885 Allergy status to narcotic agent status: Secondary | ICD-10-CM

## 2020-07-28 DIAGNOSIS — G43909 Migraine, unspecified, not intractable, without status migrainosus: Secondary | ICD-10-CM | POA: Diagnosis present

## 2020-07-28 DIAGNOSIS — F411 Generalized anxiety disorder: Secondary | ICD-10-CM | POA: Diagnosis present

## 2020-07-28 DIAGNOSIS — F1190 Opioid use, unspecified, uncomplicated: Secondary | ICD-10-CM | POA: Diagnosis not present

## 2020-07-28 DIAGNOSIS — F17200 Nicotine dependence, unspecified, uncomplicated: Secondary | ICD-10-CM | POA: Diagnosis present

## 2020-07-28 DIAGNOSIS — G894 Chronic pain syndrome: Secondary | ICD-10-CM | POA: Diagnosis present

## 2020-07-28 DIAGNOSIS — D638 Anemia in other chronic diseases classified elsewhere: Secondary | ICD-10-CM | POA: Diagnosis present

## 2020-07-28 DIAGNOSIS — Z20822 Contact with and (suspected) exposure to covid-19: Secondary | ICD-10-CM | POA: Diagnosis present

## 2020-07-28 DIAGNOSIS — I272 Pulmonary hypertension, unspecified: Secondary | ICD-10-CM | POA: Diagnosis present

## 2020-07-28 LAB — COMPREHENSIVE METABOLIC PANEL
ALT: 14 U/L (ref 0–44)
AST: 30 U/L (ref 15–41)
Albumin: 4.3 g/dL (ref 3.5–5.0)
Alkaline Phosphatase: 57 U/L (ref 38–126)
Anion gap: 10 (ref 5–15)
BUN: <5 mg/dL — ABNORMAL LOW (ref 6–20)
CO2: 24 mmol/L (ref 22–32)
Calcium: 9.1 mg/dL (ref 8.9–10.3)
Chloride: 99 mmol/L (ref 98–111)
Creatinine, Ser: 0.39 mg/dL — ABNORMAL LOW (ref 0.44–1.00)
GFR calc Af Amer: >60 mL/min (ref >60)
GFR calc non Af Amer: >60 mL/min (ref >60)
Glucose, Bld: 95 mg/dL (ref 70–99)
Potassium: 3.4 mmol/L — ABNORMAL LOW (ref 3.5–5.1)
Sodium: 133 mmol/L — ABNORMAL LOW (ref 135–145)
Total Bilirubin: 4.8 mg/dL — ABNORMAL HIGH (ref 0.3–1.2)
Total Protein: 8.3 g/dL — ABNORMAL HIGH (ref 6.5–8.1)

## 2020-07-28 LAB — CBC WITH DIFFERENTIAL/PLATELET
Abs Immature Granulocytes: 0.18 10*3/uL — ABNORMAL HIGH (ref 0.00–0.07)
Basophils Absolute: 0.1 10*3/uL (ref 0.0–0.1)
Basophils Relative: 1 %
Eosinophils Absolute: 0.2 10*3/uL (ref 0.0–0.5)
Eosinophils Relative: 1 %
HCT: 23.2 % — ABNORMAL LOW (ref 36.0–46.0)
Hemoglobin: 8.1 g/dL — ABNORMAL LOW (ref 12.0–15.0)
Immature Granulocytes: 1 %
Lymphocytes Relative: 39 %
Lymphs Abs: 6.5 10*3/uL — ABNORMAL HIGH (ref 0.7–4.0)
MCH: 34.6 pg — ABNORMAL HIGH (ref 26.0–34.0)
MCHC: 34.9 g/dL (ref 30.0–36.0)
MCV: 99.1 fL (ref 80.0–100.0)
Monocytes Absolute: 1.5 10*3/uL — ABNORMAL HIGH (ref 0.1–1.0)
Monocytes Relative: 9 %
Neutro Abs: 8.4 10*3/uL — ABNORMAL HIGH (ref 1.7–7.7)
Neutrophils Relative %: 49 %
Platelets: 325 10*3/uL (ref 150–400)
RBC: 2.34 MIL/uL — ABNORMAL LOW (ref 3.87–5.11)
RDW: 23.9 % — ABNORMAL HIGH (ref 11.5–15.5)
WBC: 16.8 10*3/uL — ABNORMAL HIGH (ref 4.0–10.5)
nRBC: 1.2 % — ABNORMAL HIGH (ref 0.0–0.2)

## 2020-07-28 LAB — PREGNANCY, URINE: Preg Test, Ur: NEGATIVE

## 2020-07-28 LAB — RETICULOCYTES
Immature Retic Fract: 46.7 % — ABNORMAL HIGH (ref 2.3–15.9)
RBC.: 2.32 MIL/uL — ABNORMAL LOW (ref 3.87–5.11)
Retic Count, Absolute: 354 10*3/uL — ABNORMAL HIGH (ref 19.0–186.0)
Retic Ct Pct: 15.1 % — ABNORMAL HIGH (ref 0.4–3.1)

## 2020-07-28 MED ORDER — KETOROLAC TROMETHAMINE 15 MG/ML IJ SOLN
15.0000 mg | Freq: Four times a day (QID) | INTRAMUSCULAR | Status: DC
  Administered 2020-07-29 – 2020-07-30 (×6): 15 mg via INTRAVENOUS
  Filled 2020-07-28 (×7): qty 1

## 2020-07-28 MED ORDER — METHOCARBAMOL 500 MG PO TABS
500.0000 mg | ORAL_TABLET | Freq: Two times a day (BID) | ORAL | Status: DC
  Administered 2020-07-28 – 2020-07-30 (×4): 500 mg via ORAL
  Filled 2020-07-28 (×4): qty 1

## 2020-07-28 MED ORDER — SENNOSIDES-DOCUSATE SODIUM 8.6-50 MG PO TABS
1.0000 | ORAL_TABLET | Freq: Two times a day (BID) | ORAL | Status: DC
  Administered 2020-07-28 – 2020-07-30 (×4): 1 via ORAL
  Filled 2020-07-28 (×4): qty 1

## 2020-07-28 MED ORDER — SODIUM CHLORIDE 0.9% FLUSH: 9.0000 mL | INTRAVENOUS | Status: DC | PRN

## 2020-07-28 MED ORDER — POLYETHYLENE GLYCOL 3350 17 G PO PACK
17.0000 g | PACK | Freq: Every day | ORAL | Status: DC | PRN
  Administered 2020-07-29 – 2020-07-30 (×2): 17 g via ORAL
  Filled 2020-07-28 (×2): qty 1

## 2020-07-28 MED ORDER — ENOXAPARIN SODIUM 40 MG/0.4ML ~~LOC~~ SOLN
40.0000 mg | SUBCUTANEOUS | Status: DC
  Administered 2020-07-28: 40 mg via SUBCUTANEOUS
  Filled 2020-07-28 (×2): qty 0.4

## 2020-07-28 MED ORDER — PANTOPRAZOLE SODIUM 40 MG PO TBEC
40.0000 mg | DELAYED_RELEASE_TABLET | Freq: Every day | ORAL | Status: DC
  Administered 2020-07-28 – 2020-07-30 (×3): 40 mg via ORAL
  Filled 2020-07-28 (×3): qty 1

## 2020-07-28 MED ORDER — SODIUM CHLORIDE 0.45 % IV SOLN: INTRAVENOUS | Status: DC

## 2020-07-28 MED ORDER — NALOXONE HCL 0.4 MG/ML IJ SOLN: 0.4000 mg | INTRAMUSCULAR | Status: DC | PRN

## 2020-07-28 MED ORDER — OXYCODONE HCL ER 10 MG PO T12A
10.0000 mg | EXTENDED_RELEASE_TABLET | Freq: Two times a day (BID) | ORAL | Status: DC
  Administered 2020-07-28 – 2020-07-30 (×4): 10 mg via ORAL
  Filled 2020-07-28 (×4): qty 1

## 2020-07-28 MED ORDER — FLUTICASONE PROPIONATE 50 MCG/ACT NA SUSP: 2.0000 | Freq: Every day | NASAL | Status: DC

## 2020-07-28 MED ORDER — ACETAMINOPHEN 500 MG PO TABS
1000.0000 mg | ORAL_TABLET | Freq: Once | ORAL | Status: AC
  Administered 2020-07-28: 1000 mg via ORAL
  Filled 2020-07-28: qty 2

## 2020-07-28 MED ORDER — LORATADINE 10 MG PO TABS
10.0000 mg | ORAL_TABLET | Freq: Every day | ORAL | Status: DC
  Administered 2020-07-29: 10 mg via ORAL
  Filled 2020-07-28 (×2): qty 1

## 2020-07-28 MED ORDER — DIPHENHYDRAMINE HCL 25 MG PO CAPS: 25.0000 mg | ORAL_CAPSULE | ORAL | Status: DC | PRN

## 2020-07-28 MED ORDER — KETOROLAC TROMETHAMINE 30 MG/ML IJ SOLN
15.0000 mg | Freq: Once | INTRAMUSCULAR | Status: AC
  Administered 2020-07-28: 15 mg via INTRAVENOUS
  Filled 2020-07-28: qty 1

## 2020-07-28 MED ORDER — HYDROMORPHONE 1 MG/ML IV SOLN
INTRAVENOUS | Status: DC
  Administered 2020-07-28: 7.5 mg via INTRAVENOUS
  Administered 2020-07-28: 2 mg via INTRAVENOUS
  Administered 2020-07-28: 30 mg via INTRAVENOUS
  Administered 2020-07-29: 2.5 mg via INTRAVENOUS
  Administered 2020-07-29: 2 mg via INTRAVENOUS
  Administered 2020-07-29: 0.8 mg via INTRAVENOUS
  Administered 2020-07-29: 2.5 mg via INTRAVENOUS
  Filled 2020-07-28: qty 30

## 2020-07-28 MED ORDER — ONDANSETRON HCL 4 MG/2ML IJ SOLN: 4.0000 mg | Freq: Four times a day (QID) | INTRAMUSCULAR | Status: DC | PRN

## 2020-07-28 MED ORDER — FOLIC ACID 1 MG PO TABS
1.0000 mg | ORAL_TABLET | Freq: Every day | ORAL | Status: DC
  Administered 2020-07-28 – 2020-07-30 (×3): 1 mg via ORAL
  Filled 2020-07-28 (×3): qty 1

## 2020-07-28 MED ORDER — SIMETHICONE 80 MG PO CHEW
80.0000 mg | CHEWABLE_TABLET | Freq: Four times a day (QID) | ORAL | Status: DC | PRN
  Administered 2020-07-28: 80 mg via ORAL
  Filled 2020-07-28: qty 1

## 2020-07-28 NOTE — H&P (Signed)
H&P  Patient Demographics:  Kara [name], is a 34 y.o. female  MRN: [id number]   DOB - [birth year]  Admit Date - 07/28/2020  Outpatient Primary MD for the patient is [name], FNP      HPI:  Kara [name] is a 34 year old female with a medical history significant for sickle cell disease, chronic pain syndrome, opiate dependence and tolerance, history of anemia of chronic disease, and history of generalized anxiety disorder presented to sickle cell day clinic complaining of generalized pain that is consistent with her typical pain crisis.  Patient attributes pain crisis to increased stressors at home.  She has been under an increased amount of stress personally that has triggered her pain crisis.  Pain has been mostly uncontrolled by home medications.  She last had oxycodone and OxyContin this a.m. without sustained relief.  Pain intensity is 10/10 characterized as constant and throbbing.  Patient has had no sick contacts, recent travel or exposure to COVID-19 infection.  She denies any fever, chills, shortness of breath.  No headache, urinary symptoms, nausea, vomiting, or diarrhea.  Sickle cell day clinic course:  Patient admitted to sickle cell day infusion center for pain management and extended observation. Vital signs show: BP 101/63 (BP Location: Right Arm)   Pulse 68   Temp 97.8 F (36.6 C) (Oral)   Resp 10   SpO2 98% .  WBCs elevated at 16.8.  Patient is currently afebrile.  Hemoglobin 8.1, appears to be consistent with patient's baseline.  Total bilirubin elevated at 4.8.  Urine pregnancy negative.  COVID-19 test negative.  All other lab values are largely within normal limits.  Patient's pain persists despite IV Dilaudid PCA, IV fluids, and IV Toradol.  Patient admitted to MedSurg for further management of pain crisis.    Review of systems:  In addition to the HPI above, patient reports Review of Systems  Constitutional: Negative for chills and fever.  HENT: Negative.    Eyes: Negative.   Cardiovascular: Negative.   Gastrointestinal: Negative.   Genitourinary: Negative.   Musculoskeletal: Positive for back pain and joint pain.  Skin: Negative.   Neurological: Negative.   Psychiatric/Behavioral: The patient is nervous/anxious.    A full 10 point Review of Systems was done, except as stated above, all other Review of Systems were negative.  With Past History of the following :   Past Medical History:  Diagnosis Date  . Arthritis   . Chronic migraine   . GERD (gastroesophageal reflux disease)   . Hx of cholecystectomy 2015  . Pulmonary hypertension (HCC)   . Sickle cell anemia (HCC)   . Tendinitis    left elbow      Past Surgical History:  Procedure Laterality Date  . DILATION AND CURETTAGE OF UTERUS    . GALLBLADDER SURGERY       Social History:   Social History   Tobacco Use  . Smoking status: Current Every Day Smoker  . Smokeless tobacco: Never Used  Substance Use Topics  . Alcohol use: No     Lives - At home   Family History :   Family History  Adopted: Yes     Home Medications:   Prior to Admission medications   Medication Sig Start Date End Date Taking? Authorizing Provider  butalbital-acetaminophen-caffeine (FIORICET) 50-325-40 MG tablet Take 1 tablet by mouth every 6 (six) hours as needed for headache. 06/11/20  Yes [name], FNP  cetirizine (ZYRTEC) 10 MG tablet Take 1 tablet (10 mg  total) by mouth daily. 04/14/20  Yes [name], FNP  Ensure Plus (ENSURE PLUS) LIQD Take 237 mLs by mouth 3 (three) times daily between meals. 08/26/19  Yes [name], FNP  folic acid (FOLVITE) 1 MG tablet Take 1 tablet (1 mg total) by mouth daily. 04/14/20  Yes [name], FNP  methocarbamol (ROBAXIN) 500 MG tablet Take 1 tablet (500 mg total) by mouth in the morning and at bedtime. 04/14/20  Yes [name], FNP  naproxen (NAPROSYN) 500 MG tablet Take 1 tablet (500 mg total) by mouth 2 (two) times daily  with a meal. 07/14/19  Yes [name], FNP  omeprazole (PRILOSEC) 40 MG capsule Take 1 capsule (40 mg total) by mouth daily. 04/14/20  Yes [name], FNP  oxyCODONE (ROXICODONE) 15 MG immediate release tablet Take 1 tablet (15 mg total) by mouth every 4 (four) hours as needed for up to 15 days. 07/19/20 08/03/20 Yes [name], FNP  Vitamin D, Ergocalciferol, (DRISDOL) 1.25 MG (50000 UNIT) CAPS capsule Take 1 capsule (50,000 Units total) by mouth every 7 (seven) days. 04/14/20  Yes [name], FNP  Benzocaine 20 % OINT Apply 1 application topically 4 (four) times daily as needed. Patient not taking: Reported on 04/13/2020 04/02/20   Barbette Merino, NP  fluticasone [facility]) 50 MCG/ACT nasal spray Place 2 sprays into both nostrils daily. Patient not taking: Reported on 07/19/2020 02/20/19   Menshew, Charlesetta Ivory, PA-C  hydrOXYzine (ATARAX/VISTARIL) 10 MG tablet Take 10 mg by mouth 2 (two) times daily. Patient not taking: Reported on 07/19/2020 02/20/20   [provider]  ibuprofen (ADVIL) 200 MG tablet Take 800 mg by mouth every 6 (six) hours as needed for moderate pain. Patient not taking: Reported on 07/19/2020    [provider]  oxyCODONE (OXYCONTIN) 10 mg 12 hr tablet Take 1 tablet (10 mg total) by mouth every 8 (eight) hours as needed. 07/19/20 08/18/20  [name], FNP     Allergies:   Allergies  Allergen Reactions  . Kiwi Extract Hives  . Morphine And Related Hives  . Paroxetine Hives     Physical Exam:   Vitals:   Vitals:   07/28/20 1300 07/28/20 1551  BP: (!) 113/58 109/60  Pulse: 87 84  Resp: 16 13  Temp:  97.8 F (36.6 C)  SpO2: 99% 94%    Physical Exam: Constitutional: Patient appears well-developed and well-nourished. Not in obvious distress. HENT: Normocephalic, atraumatic, External right and left ear normal. Oropharynx is clear and moist.  Eyes: Conjunctivae and EOM are normal. PERRLA, no scleral icterus. Neck: Normal  ROM. Neck supple. No JVD. No tracheal deviation. No thyromegaly. CVS: RRR, S1/S2 +, no murmurs, no gallops, no carotid bruit.  Pulmonary: Effort and breath sounds normal, no stridor, rhonchi, wheezes, rales.  Abdominal: Soft. BS +, no distension, tenderness, rebound or guarding.  Musculoskeletal: Normal range of motion. No edema and no tenderness.  Lymphadenopathy: No lymphadenopathy noted, cervical, inguinal or axillary Neuro: Alert. Normal reflexes, muscle tone coordination. No cranial nerve deficit. Skin: Skin is warm and dry. No rash noted. Not diaphoretic. No erythema. No pallor. Psychiatric: Normal mood and affect. Behavior, judgment, thought content normal.   Data Review:   CBC Recent Labs  Lab 07/28/20 1224  WBC 16.8*  HGB 8.1*  HCT 23.2*  PLT 325  MCV 99.1  MCH 34.6*  MCHC 34.9  RDW 23.9*  LYMPHSABS 6.5*  MONOABS 1.5*  EOSABS  0.2  BASOSABS 0.1   ------------------------------------------------------------------------------------------------------------------  Chemistries  Recent Labs  Lab 07/28/20 1224  NA 133*  K 3.4*  CL 99  CO2 24  GLUCOSE 95  BUN <5*  CREATININE 0.39*  CALCIUM 9.1  AST 30  ALT 14  ALKPHOS 57  BILITOT 4.8*   ------------------------------------------------------------------------------------------------------------------ CrCl cannot be calculated (Unknown ideal weight.). ------------------------------------------------------------------------------------------------------------------ No results for input(s): TSH, T4TOTAL, T3FREE, THYROIDAB in the last 72 hours.  Invalid input(s): FREET3  Coagulation profile No results for input(s): INR, PROTIME in the last 168 hours. ------------------------------------------------------------------------------------------------------------------- No results for input(s): DDIMER in the last 72  hours. -------------------------------------------------------------------------------------------------------------------  Cardiac Enzymes No results for input(s): CKMB, TROPONINI, MYOGLOBIN in the last 168 hours.  Invalid input(s): CK ------------------------------------------------------------------------------------------------------------------ No results found for: BNP  ---------------------------------------------------------------------------------------------------------------  Urinalysis    Component Value Date/Time   COLORURINE YELLOW 04/05/2020 1400   APPEARANCEUR CLEAR 04/05/2020 1400   LABSPEC 1.010 04/05/2020 1400   PHURINE 6.0 04/05/2020 1400   GLUCOSEU NEGATIVE 04/05/2020 1400   HGBUR MODERATE (A) 04/05/2020 1400   BILIRUBINUR small 06/11/2020 1217   KETONESUR NEGATIVE 04/05/2020 1400   PROTEINUR Positive (A) 06/11/2020 1217   PROTEINUR NEGATIVE 04/05/2020 1400   UROBILINOGEN >=8.0 (A) 06/11/2020 1217   NITRITE negative 06/11/2020 1217   NITRITE NEGATIVE 04/05/2020 1400   LEUKOCYTESUR Negative 06/11/2020 1217   LEUKOCYTESUR NEGATIVE 04/05/2020 1400    ----------------------------------------------------------------------------------------------------------------   Imaging Results:    No results found.   Assessment & Plan:  Principal Problem:   Sickle cell pain crisis (HCC) Active Problems:   Leukocytosis   Pulmonary hypertension (HCC)   Chronic pain syndrome   Anemia of chronic disease  Sickle cell disease with pain crisis: Admit to MedSurg. Continue IV fluids, 0.45% saline at 100 mL/h. Continue IV Dilaudid PCA with settings of 0.5 mg, 10-minute lockout, and 3 mg/h. Toradol 15 mg IV every 6 hours for total of 5 days. Continue OxyContin 10 mg every 12 hours.  Hold oxycodone, use PCA. Supplemental oxygen as needed. Reevaluate pain scale regularly and monitor vital signs closely. Pain will be reevaluated in context of function and relationship to  baseline as patient's care progresses.  Leukocytosis: WBC 16.8.  Patient is afebrile without any signs of infection or inflammation.  COVID-19 test negative.  Urine culture and urinalysis pending.  Continue to follow.  Repeat CBC in a.m.  Sickle cell anemia: Patient's hemoglobin is 8.1, consistent with her baseline.  No clinical indication for blood transfusion at this time.  Continue to monitor closely.  Chronic pain syndrome: Continue home medications.  Generalized anxiety disorder: Patient is generally anxious, she has been experiencing increased stress due to family constraints.  Contacted Abigail Butts, LCSW for further evaluation.  Patient is currently not on medications for generalized anxiety.  She denies any suicidal or homicidal ideations.  Continue to monitor closely.   DVT Prophylaxis: Subcut Lovenox and SCDs  AM Labs Ordered, also please review Full Orders  Family Communication: Admission, patient's condition and plan of care including tests being ordered have been discussed with the patient who indicate understanding and agree with the plan and Code Status.  Code Status: Full Code  Consults called: None    Admission status: Inpatient    Time spent in minutes : 35 minutes  Nolon Nations  APRN, MSN, FNP-C Patient Care [facility] [address] [place], [place] [address] [phone]  07/28/2020 at 4:19 PM

## 2020-07-28 NOTE — Discharge Instructions (Signed)
Sickle Cell Anemia, Adult  Sickle cell anemia is a condition where your red blood cells are shaped like sickles. Red blood cells carry oxygen through the body. Sickle-shaped cells do not live as long as normal red blood cells. They also clump together and block blood from flowing through the blood vessels. This prevents the body from getting enough oxygen. Sickle cell anemia causes organ damage and pain. It also increases the risk of infection. Follow these instructions at home: Medicines  Take over-the-counter and prescription medicines only as told by your doctor.  If you were prescribed an antibiotic medicine, take it as told by your doctor. Do not stop taking the antibiotic even if you start to feel better.  If you develop a fever, do not take medicines to lower the fever right away. Tell your doctor about the fever. Managing pain, stiffness, and swelling  Try these methods to help with pain: ? Use a heating pad. ? Take a warm bath. ? Distract yourself, such as by watching TV. Eating and drinking  Drink enough fluid to keep your pee (urine) clear or pale yellow. Drink more in hot weather and during exercise.  Limit or avoid alcohol.  Eat a healthy diet. Eat plenty of fruits, vegetables, whole grains, and lean protein.  Take vitamins and supplements as told by your doctor. Traveling  When traveling, keep these with you: ? Your medical information. ? The names of your doctors. ? Your medicines.  If you need to take an airplane, talk to your doctor first. Activity  Rest often.  Avoid exercises that make your heart beat much faster, such as jogging. General instructions  Do not use products that have nicotine or tobacco, such as cigarettes and e-cigarettes. If you need help quitting, ask your doctor.  Consider wearing a medical alert bracelet.  Avoid being in high places (high altitudes), such as mountains.  Avoid very hot or cold temperatures.  Avoid places where the  temperature changes a lot.  Keep all follow-up visits as told by your doctor. This is important. Contact a doctor if:  A joint hurts.  Your feet or hands hurt or swell.  You feel tired (fatigued). Get help right away if:  You have symptoms of infection. These include: ? Fever. ? Chills. ? Being very tired. ? Irritability. ? Poor eating. ? Throwing up (vomiting).  You feel dizzy or faint.  You have new stomach pain, especially on the left side.  You have a an erection (priapism) that lasts more than 4 hours.  You have numbness in your arms or legs.  You have a hard time moving your arms or legs.  You have trouble talking.  You have pain that does not go away when you take medicine.  You are short of breath.  You are breathing fast.  You have a long-term cough.  You have pain in your chest.  You have a bad headache.  You have a stiff neck.  Your stomach looks bloated even though you did not eat much.  Your skin is pale.  You suddenly cannot see well. Summary  Sickle cell anemia is a condition where your red blood cells are shaped like sickles.  Follow your doctor's advice on ways to manage pain, food to eat, activities to do, and steps to take for safe travel.  Get medical help right away if you have any signs of infection, such as a fever. This information is not intended to replace advice given to you by   your health care provider. Make sure you discuss any questions you have with your health care provider. Document Revised: 02/28/2019 Document Reviewed: 12/12/2016 Elsevier Patient Education  2020 [employer]  

## 2020-07-28 NOTE — H&P (Signed)
[facility] History and Physical   Date: 07/28/2020  Patient name: Kara [name] Medical record number: [id number] Date of birth: 24-Jun-1986 Age: 34 y.o. Gender: female PCP: [name], FNP  Attending physician: Quentin Angst, MD  Chief Complaint: Sickle cell pain  History of Present Illness: Kara [name] is a 34 year old female with a medical history significant for sickle cell disease, chronic pain syndrome, opiate dependence and tolerance, history of anemia of chronic disease, and history of generalized anxiety disorder presents complaining of generalized pain that is consistent with her typical pain crisis.  Patient attributes pain crisis to increased stressors at home.  She has been under an increased amount of stress personally that has triggered her pain crisis.  Pain has been mostly uncontrolled by home medications.  She last had oxycodone and OxyContin this a.m. without sustained relief.  Pain intensity is 10/10 characterized as constant and throbbing.  Patient has had no sick contacts, recent travel or exposure to COVID-19 infection.  She denies any fever, chills, shortness of breath.  No headache, urinary symptoms, nausea, vomiting, or diarrhea.  Meds: Medications Prior to Admission  Medication Sig Dispense Refill Last Dose  . butalbital-acetaminophen-caffeine (FIORICET) 50-325-40 MG tablet Take 1 tablet by mouth every 6 (six) hours as needed for headache. 14 tablet 0 Past Week at Unknown time  . cetirizine (ZYRTEC) 10 MG tablet Take 1 tablet (10 mg total) by mouth daily. 30 tablet 11 07/27/2020 at Unknown time  . Ensure Plus (ENSURE PLUS) LIQD Take 237 mLs by mouth 3 (three) times daily between meals. 237 mL 6 Past Week at Unknown time  . folic acid (FOLVITE) 1 MG tablet Take 1 tablet (1 mg total) by mouth daily. 30 tablet 11 Past Week at Unknown time  . methocarbamol (ROBAXIN) 500 MG tablet Take 1 tablet (500 mg total) by mouth in the morning and at  bedtime. 60 tablet 6 Past Month at Unknown time  . naproxen (NAPROSYN) 500 MG tablet Take 1 tablet (500 mg total) by mouth 2 (two) times daily with a meal. 60 tablet 3 Past Month at Unknown time  . omeprazole (PRILOSEC) 40 MG capsule Take 1 capsule (40 mg total) by mouth daily. 30 capsule 11 Past Week at Unknown time  . oxyCODONE (ROXICODONE) 15 MG immediate release tablet Take 1 tablet (15 mg total) by mouth every 4 (four) hours as needed for up to 15 days. 90 tablet 0 07/28/2020 at Unknown time  . Vitamin D, Ergocalciferol, (DRISDOL) 1.25 MG (50000 UNIT) CAPS capsule Take 1 capsule (50,000 Units total) by mouth every 7 (seven) days. 5 capsule 2 Past Month at Unknown time  . Benzocaine 20 % OINT Apply 1 application topically 4 (four) times daily as needed. (Patient not taking: Reported on 04/13/2020) 28 g 2   . fluticasone (FLONASE) 50 MCG/ACT nasal spray Place 2 sprays into both nostrils daily. (Patient not taking: Reported on 07/19/2020) 16 g 0   . hydrOXYzine (ATARAX/VISTARIL) 10 MG tablet Take 10 mg by mouth 2 (two) times daily. (Patient not taking: Reported on 07/19/2020)     . ibuprofen (ADVIL) 200 MG tablet Take 800 mg by mouth every 6 (six) hours as needed for moderate pain. (Patient not taking: Reported on 07/19/2020)     . oxyCODONE (OXYCONTIN) 10 mg 12 hr tablet Take 1 tablet (10 mg total) by mouth every 8 (eight) hours as needed. 90 tablet 0     Allergies: Kiwi extract, Morphine and related, and Paroxetine  Past Medical History:  Diagnosis Date  . Arthritis   . Chronic migraine   . GERD (gastroesophageal reflux disease)   . Hx of cholecystectomy 2015  . Pulmonary hypertension (HCC)   . Sickle cell anemia (HCC)   . Tendinitis    left elbow   Past Surgical History:  Procedure Laterality Date  . DILATION AND CURETTAGE OF UTERUS    . GALLBLADDER SURGERY     Family History  Adopted: Yes   Social History   Socioeconomic History  . Marital status: Single    Spouse name: Not on  file  . Number of children: Not on file  . Years of education: Not on file  . Highest education level: Not on file  Occupational History  . Not on file  Tobacco Use  . Smoking status: Current Every Day Smoker  . Smokeless tobacco: Never Used  Vaping Use  . Vaping Use: Never used  Substance and Sexual Activity  . Alcohol use: No  . Drug use: Yes    Types: Marijuana  . Sexual activity: Yes    Birth control/protection: None  Other Topics Concern  . Not on file  Social History Narrative  . Not on file   Social Determinants of Health   Financial Resource Strain:   . Difficulty of Paying Living Expenses: Not on file  Food Insecurity:   . Worried About Programme researcher, broadcasting/film/videounning Out of Food in the Last Year: Not on file  . Ran Out of Food in the Last Year: Not on file  Transportation Needs:   . Lack of Transportation (Medical): Not on file  . Lack of Transportation (Non-Medical): Not on file  Physical Activity:   . Days of Exercise per Week: Not on file  . Minutes of Exercise per Session: Not on file  Stress:   . Feeling of Stress : Not on file  Social Connections:   . Frequency of Communication with Friends and Family: Not on file  . Frequency of Social Gatherings with Friends and Family: Not on file  . Attends Religious Services: Not on file  . Active Member of Clubs or Organizations: Not on file  . Attends BankerClub or Organization Meetings: Not on file  . Marital Status: Not on file  Intimate Partner Violence:   . Fear of Current or Ex-Partner: Not on file  . Emotionally Abused: Not on file  . Physically Abused: Not on file  . Sexually Abused: Not on file   Review of Systems  Constitutional: Negative for chills and fever.  HENT: Negative.   Eyes: Negative.   Respiratory: Negative.   Cardiovascular: Negative.   Gastrointestinal: Negative for constipation and diarrhea.  Genitourinary: Negative.   Musculoskeletal: Positive for back pain and joint pain.  Skin: Negative.   Neurological:  Negative.   Psychiatric/Behavioral: Negative.     Physical Exam: Blood pressure 109/60, pulse 84, temperature 97.8 F (36.6 C), temperature source Oral, resp. rate 13, SpO2 94 %. Physical Exam Constitutional:      Appearance: Normal appearance.  HENT:     Head: Normocephalic.     Mouth/Throat:     Mouth: Mucous membranes are moist.  Eyes:     Pupils: Pupils are equal, round, and reactive to light.  Cardiovascular:     Rate and Rhythm: Normal rate and regular rhythm.     Pulses: Normal pulses.     Heart sounds: Normal heart sounds.  Pulmonary:     Effort: Pulmonary effort is normal.  Breath sounds: Normal breath sounds.  Abdominal:     General: Abdomen is flat. Bowel sounds are normal.  Skin:    General: Skin is warm.  Neurological:     General: No focal deficit present.     Mental Status: She is alert.  Psychiatric:        Mood and Affect: Mood normal.        Thought Content: Thought content normal.        Judgment: Judgment normal.      Lab results: Results for orders placed or performed during the hospital encounter of 07/28/20 (from the past 24 hour(s))  CBC with Differential/Platelet     Status: Abnormal   Collection Time: 07/28/20 12:24 PM  Result Value Ref Range   WBC 16.8 (H) 4.0 - 10.5 K/uL   RBC 2.34 (L) 3.87 - 5.11 MIL/uL   Hemoglobin 8.1 (L) 12.0 - 15.0 g/dL   HCT 38.7 (L) 36 - 46 %   MCV 99.1 80.0 - 100.0 fL   MCH 34.6 (H) 26.0 - 34.0 pg   MCHC 34.9 30.0 - 36.0 g/dL   RDW 56.4 (H) 33.2 - 95.1 %   Platelets 325 150 - 400 K/uL   nRBC 1.2 (H) 0.0 - 0.2 %   Neutrophils Relative % 49 %   Neutro Abs 8.4 (H) 1.7 - 7.7 K/uL   Lymphocytes Relative 39 %   Lymphs Abs 6.5 (H) 0.7 - 4.0 K/uL   Monocytes Relative 9 %   Monocytes Absolute 1.5 (H) 0 - 1 K/uL   Eosinophils Relative 1 %   Eosinophils Absolute 0.2 0 - 0 K/uL   Basophils Relative 1 %   Basophils Absolute 0.1 0 - 0 K/uL   Immature Granulocytes 1 %   Abs Immature Granulocytes 0.18 (H) 0.00 -  0.07 K/uL   Polychromasia PRESENT    Sickle Cells MARKED    Target Cells PRESENT   Reticulocytes     Status: Abnormal   Collection Time: 07/28/20 12:24 PM  Result Value Ref Range   Retic Ct Pct 15.1 (H) 0.4 - 3.1 %   RBC. 2.32 (L) 3.87 - 5.11 MIL/uL   Retic Count, Absolute 354.0 (H) 19.0 - 186.0 K/uL   Immature Retic Fract 46.7 (H) 2.3 - 15.9 %  Comprehensive metabolic panel     Status: Abnormal   Collection Time: 07/28/20 12:24 PM  Result Value Ref Range   Sodium 133 (L) 135 - 145 mmol/L   Potassium 3.4 (L) 3.5 - 5.1 mmol/L   Chloride 99 98 - 111 mmol/L   CO2 24 22 - 32 mmol/L   Glucose, Bld 95 70 - 99 mg/dL   BUN <5 (L) 6 - 20 mg/dL   Creatinine, Ser 8.84 (L) 0.44 - 1.00 mg/dL   Calcium 9.1 8.9 - 16.6 mg/dL   Total Protein 8.3 (H) 6.5 - 8.1 g/dL   Albumin 4.3 3.5 - 5.0 g/dL   AST 30 15 - 41 U/L   ALT 14 0 - 44 U/L   Alkaline Phosphatase 57 38 - 126 U/L   Total Bilirubin 4.8 (H) 0.3 - 1.2 mg/dL   GFR calc non Af Amer >60 >60 mL/min   GFR calc Af Amer >60 >60 mL/min   Anion gap 10 5 - 15  Pregnancy, urine     Status: None   Collection Time: 07/28/20 12:24 PM  Result Value Ref Range   Preg Test, Ur NEGATIVE NEGATIVE    Imaging results:  No  results found.   Assessment & Plan: Patient admitted to sickle cell day infusion center for management of pain crisis.  Patient is opiate tolerant Initiate IV dilaudid PCA. Settings of 0.5 mg, 10 minute lockout and 3 mg per hour IV fluids, 0.45% saline at 100 ml/hr Toradol 15 mg IV times one dose Tylenol 1000 mg by mouth times one dose Review CBC with differential, complete metabolic panel, and reticulocytes as results become available.  Pain intensity will be reevaluated in context of functioning and relationship to baseline as care progress If pain intensity remains elevated and/or sudden change in hemodynamic stability transition to inpatient services for higher level of care.       Nolon Nations  APRN, MSN,  FNP-C Patient Care [facility] [address] [place], [place] [address] [phone]  07/28/2020, 4:07 PM

## 2020-07-28 NOTE — Progress Notes (Signed)
Patient admitted to the day infusion hospital for sickle cell pain crisis. Patient initially reported generalized pain rated 7/10. For pain management, patient placed on Dilaudid PCA, given 1000 mg Tylenol, 15 mg Toradol and hydrated with IV fluids. Patient's pain remained elevated and provider placed orders to admit patient to inpatient unit. Report called to [facility] on [address]. Patient transferred to [address] in wheelchair. Vital signs wnl. Patient alert, oriented and stable at transfer.

## 2020-07-28 NOTE — BH Specialist Note (Signed)
Integrated Behavioral Health Case Management Referral Note  07/28/2020 Name: Kara [name] MRN: [id number] DOB: [birth year] Kara [name] is a 34 y.o. year old female who sees Kara Locks, FNP for primary care. LCSW was consulted to assess patient's needs and assist the patient with social stressors and community resources.  Interpreter: No.   Interpreter Name & Language: none  Assessment: Patient experiencing relationship dysfunction. Related stress has likely contributed to current sickle cell pain. Patient in needs of community resources to assist with this issue. Provided resources and brief supportive counseling at bedside. Brief mindfulness and relaxation exercise.   Review of patient status, including review of consultants reports, relevant laboratory and other test results, and collaboration with appropriate care team members and the patient's provider was performed as part of comprehensive patient evaluation and provision of services.    SDOH (Social Determinants of Health) assessments performed: No    Goals Addressed   None      Follow up Plan: 1. CSW available from clinic as needed   Kara Butts, LCSW [facility] [phone]

## 2020-07-28 NOTE — Telephone Encounter (Signed)
Pt called c/o of sickle cell crisis.  Negative for fever, CP, abdominal pain, or diarrhea.  Covid screen negative. Pt has transportation.  Informed to come before 12 Noon.  Pt verbalized understanding and agreed to this plan.

## 2020-07-29 DIAGNOSIS — D72829 Elevated white blood cell count, unspecified: Secondary | ICD-10-CM

## 2020-07-29 LAB — SARS CORONAVIRUS 2 (TAT 6-24 HRS): SARS Coronavirus 2: NEGATIVE

## 2020-07-29 LAB — BASIC METABOLIC PANEL
Anion gap: 6 (ref 5–15)
BUN: 5 mg/dL — ABNORMAL LOW (ref 6–20)
CO2: 25 mmol/L (ref 22–32)
Calcium: 8.5 mg/dL — ABNORMAL LOW (ref 8.9–10.3)
Chloride: 102 mmol/L (ref 98–111)
Creatinine, Ser: 0.4 mg/dL — ABNORMAL LOW (ref 0.44–1.00)
GFR calc Af Amer: >60 mL/min (ref >60)
GFR calc non Af Amer: >60 mL/min (ref >60)
Glucose, Bld: 110 mg/dL — ABNORMAL HIGH (ref 70–99)
Potassium: 3.4 mmol/L — ABNORMAL LOW (ref 3.5–5.1)
Sodium: 133 mmol/L — ABNORMAL LOW (ref 135–145)

## 2020-07-29 LAB — URINALYSIS, ROUTINE W REFLEX MICROSCOPIC
Bilirubin Urine: NEGATIVE
Glucose, UA: NEGATIVE mg/dL
Hgb urine dipstick: NEGATIVE
Ketones, ur: NEGATIVE mg/dL
Leukocytes,Ua: NEGATIVE
Nitrite: NEGATIVE
Protein, ur: NEGATIVE mg/dL
Specific Gravity, Urine: 1.01 (ref 1.005–1.030)
pH: 5 (ref 5.0–8.0)

## 2020-07-29 LAB — CBC
HCT: 19.7 % — ABNORMAL LOW (ref 36.0–46.0)
Hemoglobin: 7 g/dL — ABNORMAL LOW (ref 12.0–15.0)
MCH: 35.2 pg — ABNORMAL HIGH (ref 26.0–34.0)
MCHC: 35.5 g/dL (ref 30.0–36.0)
MCV: 99 fL (ref 80.0–100.0)
Platelets: 266 10*3/uL (ref 150–400)
RBC: 1.99 MIL/uL — ABNORMAL LOW (ref 3.87–5.11)
RDW: 22.2 % — ABNORMAL HIGH (ref 11.5–15.5)
WBC: 11.1 10*3/uL — ABNORMAL HIGH (ref 4.0–10.5)
nRBC: 1.9 % — ABNORMAL HIGH (ref 0.0–0.2)

## 2020-07-29 LAB — HIV ANTIBODY (ROUTINE TESTING W REFLEX): HIV Screen 4th Generation wRfx: NONREACTIVE

## 2020-07-29 MED ORDER — OXYCODONE HCL 5 MG PO TABS: 15.0000 mg | ORAL_TABLET | ORAL | Status: DC | PRN

## 2020-07-29 MED ORDER — HYDROMORPHONE 1 MG/ML IV SOLN
INTRAVENOUS | Status: DC
  Administered 2020-07-29: 30 mg via INTRAVENOUS
  Administered 2020-07-29: 2.5 mg via INTRAVENOUS
  Administered 2020-07-30: 3 mg via INTRAVENOUS
  Administered 2020-07-30: 0.5 mg via INTRAVENOUS
  Administered 2020-07-30: 2.5 mg via INTRAVENOUS
  Filled 2020-07-29: qty 30

## 2020-07-29 MED ORDER — ENSURE ENLIVE PO LIQD
237.0000 mL | Freq: Two times a day (BID) | ORAL | Status: DC
  Administered 2020-07-29 – 2020-07-30 (×2): 237 mL via ORAL

## 2020-07-29 MED ORDER — HYDROXYZINE HCL 25 MG PO TABS: 25.0000 mg | ORAL_TABLET | Freq: Three times a day (TID) | ORAL | Status: DC | PRN

## 2020-07-29 NOTE — Plan of Care (Signed)
  Problem: Self-Care: Goal: Ability to incorporate actions that prevent/reduce pain crisis will improve 07/29/2020 2016 by Brett Albino, RN Outcome: Progressing 07/29/2020 2016 by Brett Albino, RN Outcome: Progressing   Problem: Sensory: Goal: Pain level will decrease with appropriate interventions 07/29/2020 2016 by Brett Albino, RN Outcome: Progressing 07/29/2020 2016 by Brett Albino, RN Outcome: Progressing

## 2020-07-29 NOTE — Progress Notes (Signed)
Initial Nutrition Assessment  INTERVENTION:   -Recommend weight pt for admission -Ensure Enlive po BID, each supplement provides 350 kcal and 20 grams of protein  NUTRITION DIAGNOSIS:   Inadequate oral intake related to  (sickle cell pain crisis) as evidenced by per patient/family report.  GOAL:   Patient will meet greater than or equal to 90% of their needs  MONITOR:   PO intake, Supplement acceptance, Labs, Weight trends, I & O's  REASON FOR ASSESSMENT:   Malnutrition Screening Tool    ASSESSMENT:   34 year old female with a medical history significant for sickle cell disease, chronic pain syndrome, opiate dependence and tolerance, history of anemia of chronic disease, and history of generalized anxiety disorder presents complaining of generalized pain that is consistent with her typical pain crisis.  Patient reporting some nausea PTA r/t pain crisis. Will order Ensure supplements (Ensure Plus listed in home medications).  Per weight records, pt has lost 7 lbs since 5/25. Last recorded weight is 116 lbs on 7/27. No weight documented for this admission.  Medications: Folic acid, Senokot Labs reviewed: Low Na, K  NUTRITION - FOCUSED PHYSICAL EXAM:  Unable to complete  Diet Order:   Diet Order            Diet regular Room service appropriate? Yes  Diet effective now                 EDUCATION NEEDS:   No education needs have been identified at this time  Skin:  Skin Assessment: Reviewed RN Assessment  Last BM:  9/7  Height:   Ht Readings from Last 1 Encounters:  06/15/20 5\' 4"  (1.626 m)    Weight:   Wt Readings from Last 1 Encounters:  06/15/20 53.1 kg    BMI:  There is no height or weight on file to calculate BMI.  Estimated Nutritional Needs:   Kcal:  1400-1600  Protein:  65-75g  Fluid:  1.6L/day  06/17/20, MS, RD, LDN Inpatient Clinical Dietitian Contact information available via Amion
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
## 2020-07-29 NOTE — Progress Notes (Signed)
Subjective: Kara [name] is a 34 year old female with a medical history significant for sickle cell disease, chronic pain syndrome, opiate dependence and tolerance, history of anemia of chronic disease, and history of generalized anxiety disorder was admitted for sickle cell crisis. Patient says that pain has improved some overnight.  Pain intensity is 6/10 primarily to low back and lower extremities.  Patient continues to endorse some anxiety including excessive worrying.  She denies any headache, dizziness, urinary symptoms, nausea, vomiting, or diarrhea.  Patient denies any suicidal or homicidal intent. Objective:  Vital signs in last 24 hours:  Vitals:   07/29/20 1014 07/29/20 1017 07/29/20 1200 07/29/20 1439  BP: 108/63   (!) 94/48  Pulse: 77   66  Resp: 14  16 15   Temp: 98.5 F (36.9 C)   98.3 F (36.8 C)  TempSrc: Oral   Oral  SpO2: 99%  100% 100%  Weight:  54.6 kg      Intake/Output from previous day:  No intake or output data in the 24 hours ending 07/29/20 1647  Physical Exam: General: Alert, awake, oriented x3, in no acute distress.  HEENT: [place]/AT PEERL, EOMI Neck: Trachea midline,  no masses, no thyromegal,y no JVD, no carotid bruit OROPHARYNX:  Moist, No exudate/ erythema/lesions.  Heart: Regular rate and rhythm, without murmurs, rubs, gallops, PMI non-displaced, no heaves or thrills on palpation.  Lungs: Clear to auscultation, no wheezing or rhonchi noted. No increased vocal fremitus resonant to percussion  Abdomen: Soft, nontender, nondistended, positive bowel sounds, no masses no hepatosplenomegaly noted..  Neuro: No focal neurological deficits noted cranial nerves II through XII grossly intact. DTRs 2+ bilaterally upper and lower extremities. Strength 5 out of 5 in bilateral upper and lower extremities. Musculoskeletal: No warm swelling or erythema around joints, no spinal tenderness noted. Psychiatric: Patient alert and oriented x3, good insight and cognition, good  recent to remote recall. Lymph node survey: No cervical axillary or inguinal lymphadenopathy noted.  Lab Results:  Basic Metabolic Panel:    Component Value Date/Time   NA 133 (L) 07/29/2020 0605   NA 138 07/14/2019 1128   K 3.4 (L) 07/29/2020 0605   CL 102 07/29/2020 0605   CO2 25 07/29/2020 0605   BUN 5 (L) 07/29/2020 0605   BUN 7 07/14/2019 1128   CREATININE 0.40 (L) 07/29/2020 0605   GLUCOSE 110 (H) 07/29/2020 0605   CALCIUM 8.5 (L) 07/29/2020 0605   CBC:    Component Value Date/Time   WBC 11.1 (H) 07/29/2020 0605   HGB 7.0 (L) 07/29/2020 0605   HGB 9.1 (L) 07/14/2019 1128   HCT 19.7 (L) 07/29/2020 0605   HCT 27.8 (L) 07/14/2019 1128   PLT 266 07/29/2020 0605   PLT 435 07/14/2019 1128   MCV 99.0 07/29/2020 0605   MCV 103 (H) 07/14/2019 1128   NEUTROABS 8.4 (H) 07/28/2020 1224   NEUTROABS 5.0 07/14/2019 1128   LYMPHSABS 6.5 (H) 07/28/2020 1224   LYMPHSABS 7.3 (H) 07/14/2019 1128   MONOABS 1.5 (H) 07/28/2020 1224   EOSABS 0.2 07/28/2020 1224   EOSABS 0.3 07/14/2019 1128   BASOSABS 0.1 07/28/2020 1224   BASOSABS 0.1 07/14/2019 1128    Recent Results (from the past 240 hour(s))  SARS CORONAVIRUS 2 (TAT 6-24 HRS) Nasopharyngeal Nasopharyngeal Swab     Status: None   Collection Time: 07/28/20  5:55 PM   Specimen: Nasopharyngeal Swab  Result Value Ref Range Status   SARS Coronavirus 2 NEGATIVE NEGATIVE Final    Comment: (NOTE) SARS-CoV-2  target nucleic acids are NOT DETECTED.  The SARS-CoV-2 RNA is generally detectable in upper and lower respiratory specimens during the acute phase of infection. Negative results do not preclude SARS-CoV-2 infection, do not rule out co-infections with other pathogens, and should not be used as the sole basis for treatment or other patient management decisions. Negative results must be combined with clinical observations, patient history, and epidemiological information. The expected result is Negative.  Fact Sheet for  Patients: HairSlick.no  Fact Sheet for Healthcare Providers: quierodirigir.com  This test is not yet approved or cleared by the [place] FDA and  has been authorized for detection and/or diagnosis of SARS-CoV-2 by FDA under an Emergency Use Authorization (EUA). This EUA will remain  in effect (meaning this test can be used) for the duration of the COVID-19 declaration under Se ction 564(b)(1) of the Act, 21 U.S.C. section 360bbb-3(b)(1), unless the authorization is terminated or revoked sooner.  Performed at [facility], [address], [place], [place] [address]     Studies/Results: No results found.  Medications: Scheduled Meds: . enoxaparin (LOVENOX) injection  40 mg Subcutaneous Q24H  . feeding supplement (ENSURE ENLIVE)  237 mL Oral BID BM  . folic acid  1 mg Oral Daily  . HYDROmorphone   Intravenous Q4H  . ketorolac  15 mg Intravenous Q6H  . loratadine  10 mg Oral Daily  . methocarbamol  500 mg Oral BID  . oxyCODONE  10 mg Oral Q12H  . pantoprazole  40 mg Oral Daily  . senna-docusate  1 tablet Oral BID   Continuous Infusions: . sodium chloride 20 mL/hr at 07/29/20 1331   PRN Meds:.diphenhydrAMINE, hydrOXYzine, naloxone **AND** sodium chloride flush, ondansetron (ZOFRAN) IV, oxyCODONE, polyethylene glycol, simethicone  Consultants:  None  Procedures:  None  Antibiotics:  None  Assessment/Plan: Principal Problem:   Sickle cell pain crisis (HCC) Active Problems:   Leukocytosis   Pulmonary hypertension (HCC)   Chronic pain syndrome   Anemia of chronic disease  Sickle cell disease with pain crisis: Decrease IV fluids to KVO.  Continue IV Dilaudid PCA, decrease settings to 0.5 mg, 10-minute lockout, and 2 mg/h. Oxycodone 15 mg every 4 hours as needed for severe breakthrough pain Toradol 15 mg IV every 6 hours for total of 5 days. Supplemental oxygen as needed, monitor vital signs closely,  and reevaluate pain scale regularly.  Leukocytosis: WBCs improving.  Patient continues to be afebrile without any signs of infection or inflammation.  Follow CBC in a.m.  Sickle cell anemia: Hemoglobin decreased to 7.0 g/dL, which is below patient's baseline.  Blood transfusion is not indicated today.  Repeat CBC and type and screen in a.m.  If hemoglobin is less than 7.0 g/dL, transfuse 1 unit PRBCs.  Chronic pain syndrome: Continue home medications  Generalized anxiety disorder:  Patient continues to endorse some anxiety.  She says that it is improved some overnight.  She denies any suicidal homicidal ideations.  Continue to monitor closely.   Code Status: Full Code Family Communication: N/A Disposition Plan: Not yet ready for discharge  Caden Fatica [name]  APRN, MSN, FNP-C Patient Care Center [facility] [address] [place], [place] [address] [phone]  If 5PM-8AM, please contact night-coverage.  07/29/2020, 4:47 PM  LOS: 1 day

## 2020-07-30 ENCOUNTER — Telehealth: Payer: Self-pay | Admitting: Family Medicine

## 2020-07-30 ENCOUNTER — Other Ambulatory Visit: Payer: Self-pay | Admitting: Family Medicine

## 2020-07-30 DIAGNOSIS — F1190 Opioid use, unspecified, uncomplicated: Secondary | ICD-10-CM

## 2020-07-30 DIAGNOSIS — G8929 Other chronic pain: Secondary | ICD-10-CM

## 2020-07-30 DIAGNOSIS — D571 Sickle-cell disease without crisis: Secondary | ICD-10-CM

## 2020-07-30 LAB — CBC
HCT: 21.1 % — ABNORMAL LOW (ref 36.0–46.0)
Hemoglobin: 7.3 g/dL — ABNORMAL LOW (ref 12.0–15.0)
MCH: 34.3 pg — ABNORMAL HIGH (ref 26.0–34.0)
MCHC: 34.6 g/dL (ref 30.0–36.0)
MCV: 99.1 fL (ref 80.0–100.0)
Platelets: 348 10*3/uL (ref 150–400)
RBC: 2.13 MIL/uL — ABNORMAL LOW (ref 3.87–5.11)
RDW: 21 % — ABNORMAL HIGH (ref 11.5–15.5)
WBC: 12.1 10*3/uL — ABNORMAL HIGH (ref 4.0–10.5)
nRBC: 2.2 % — ABNORMAL HIGH (ref 0.0–0.2)

## 2020-07-30 LAB — URINE CULTURE: Culture: NO GROWTH

## 2020-07-30 MED ORDER — OXYCODONE HCL 15 MG PO TABS
15.0000 mg | ORAL_TABLET | ORAL | 0 refills | Status: DC | PRN
Start: 2020-08-02 — End: 2020-08-13

## 2020-07-30 NOTE — Discharge Summary (Signed)
Physician Discharge Summary  Kara [name] YJE:[id number] DOB: 29-May-1986 DOA: 07/28/2020  PCP: [name], FNP  Admit date: 07/28/2020  Discharge date: 07/30/2020  Discharge Diagnoses:  Principal Problem:   Sickle cell pain crisis (HCC) Active Problems:   Leukocytosis   Pulmonary hypertension (HCC)   Chronic pain syndrome   Anemia of chronic disease   Discharge Condition: Stable  Disposition:   Follow-up Information    [name], FNP Follow up in 1 week(s).   Specialty: Family Medicine Contact information: [address] [place] [address] [phone]              Pt is discharged home in good condition and is to follow up with [name], FNP in 2 weeks to have labs evaluated. Kara [name] is instructed to increase activity slowly and balance with rest for the next few days, and use prescribed medication to complete treatment of pain  Diet: Regular Wt Readings from Last 3 Encounters:  07/29/20 54.6 kg  06/15/20 53.1 kg  06/11/20 53.1 kg    History of present illness:    Hospital Course:  Sickle cell disease with pain crisis:  Patient was admitted for sickle cell pain crisis and managed appropriately with IVF, IV Dilaudid via PCA and IV Toradol, as well as other adjunct therapies per sickle cell pain management protocols.  Patient's pain is well controlled on current medication regimen.  Current pain intensity is 3/10, she feels as if she can manage at home on current medication regimen.  Patient is currently not on disease modifying agent, she has infrequent sickle cell pain crisis.  Patient has a history of generalized anxiety disorder patient.  Patient is currently not on medication for this problem.  Patient has been evaluated by social worker during this hospital visit.  Patient will schedule appointment with Kara Butts, LCSW within 1 week.  Patient is currently not having any suicidal or homicidal thoughts.  Patient  is alert, oriented, and ambulating without assistance.    Patient was therefore discharged home today in a hemodynamically stable condition.   Kara [name] will follow-up with PCP within 2 weeks of this discharge. Kara [name] was counseled extensively about nonpharmacologic means of pain management, patient verbalized understanding and was appreciative of  the care received during this admission.   We discussed the need for good hydration, monitoring of hydration status, avoidance of heat, cold, stress, and infection triggers.  Patient advised to continue folic acid 1 mg daily.  She is not on any disease modifying agents at this time, advised to discuss with PCP.  Patient was reminded of the need to seek medical attention immediately if any symptom of bleeding, anemia, or infection occurs.  Discharge Exam: Vitals:   07/30/20 0800 07/30/20 0939  BP:  (!) 97/55  Pulse:  66  Resp: 16 16  Temp:  98.6 F (37 C)  SpO2: 100% 100%   Vitals:   07/30/20 0405 07/30/20 0424 07/30/20 0800 07/30/20 0939  BP:  114/62  (!) 97/55  Pulse:  77  66  Resp: 12 14 16 16   Temp:  98.6 F (37 C)  98.6 F (37 C)  TempSrc:  Oral  Oral  SpO2: 100% 100% 100% 100%  Weight:        General appearance : Awake, alert, not in any distress. Speech Clear. Not toxic looking HEENT: Atraumatic and Normocephalic, pupils equally reactive to light and accomodation Neck: Supple, no JVD. No cervical lymphadenopathy.  Chest: Good air  entry bilaterally, no added sounds  CVS: S1 S2 regular, no murmurs.  Abdomen: Bowel sounds present, Non tender and not distended with no gaurding, rigidity or rebound. Extremities: B/L Lower Ext shows no edema, both legs are warm to touch Neurology: Awake alert, and oriented X 3, CN II-XII intact, Non focal Skin: No Rash  Discharge Instructions  Discharge Instructions    Discharge patient   Complete by: As directed    Discharge disposition: 01-Home or Self Care   Discharge patient date: 07/30/2020      Allergies as of 07/30/2020      Reactions   Kiwi Extract Hives   Morphine And Related Hives   Paroxetine Hives      Medication List    TAKE these medications   Benzocaine 20 % Oint Apply 1 application topically 4 (four) times daily as needed.   butalbital-acetaminophen-caffeine 50-325-40 MG tablet Commonly known as: FIORICET Take 1 tablet by mouth every 6 (six) hours as needed for headache.   cetirizine 10 MG tablet Commonly known as: ZYRTEC Take 1 tablet (10 mg total) by mouth daily. What changed:   when to take this  reasons to take this   Ensure Plus Liqd Take 237 mLs by mouth 3 (three) times daily between meals. What changed:   when to take this  reasons to take this   folic acid 1 MG tablet Commonly known as: FOLVITE Take 1 tablet (1 mg total) by mouth daily.   methocarbamol 500 MG tablet Commonly known as: ROBAXIN Take 1 tablet (500 mg total) by mouth in the morning and at bedtime. What changed:   when to take this  reasons to take this   naproxen 500 MG tablet Commonly known as: NAPROSYN Take 1 tablet (500 mg total) by mouth 2 (two) times daily with a meal. What changed:   when to take this  reasons to take this   omeprazole 40 MG capsule Commonly known as: PRILOSEC Take 1 capsule (40 mg total) by mouth daily.   oxyCODONE 10 mg 12 hr tablet Commonly known as: OxyCONTIN Take 1 tablet (10 mg total) by mouth every 8 (eight) hours as needed. What changed: reasons to take this   oxyCODONE 15 MG immediate release tablet Commonly known as: ROXICODONE Take 1 tablet (15 mg total) by mouth every 4 (four) hours as needed for up to 15 days. What changed: Another medication with the same name was changed. Make sure you understand how and when to take each.   Vitamin D (Ergocalciferol) 1.25 MG (50000 UNIT) Caps capsule Commonly known as: DRISDOL Take 1 capsule (50,000 Units total) by mouth every 7 (seven) days.       The results of  significant diagnostics from this hospitalization (including imaging, microbiology, ancillary and laboratory) are listed below for reference.    Significant Diagnostic Studies: No results found.  Microbiology: Recent Results (from the past 240 hour(s))  SARS CORONAVIRUS 2 (TAT 6-24 HRS) Nasopharyngeal Nasopharyngeal Swab     Status: None   Collection Time: 07/28/20  5:55 PM   Specimen: Nasopharyngeal Swab  Result Value Ref Range Status   SARS Coronavirus 2 NEGATIVE NEGATIVE Final    Comment: (NOTE) SARS-CoV-2 target nucleic acids are NOT DETECTED.  The SARS-CoV-2 RNA is generally detectable in upper and lower respiratory specimens during the acute phase of infection. Negative results do not preclude SARS-CoV-2 infection, do not rule out co-infections with other pathogens, and should not be used as the sole basis for treatment or  other patient management decisions. Negative results must be combined with clinical observations, patient history, and epidemiological information. The expected result is Negative.  Fact Sheet for Patients: HairSlick.no  Fact Sheet for Healthcare Providers: quierodirigir.com  This test is not yet approved or cleared by the [place] FDA and  has been authorized for detection and/or diagnosis of SARS-CoV-2 by FDA under an Emergency Use Authorization (EUA). This EUA will remain  in effect (meaning this test can be used) for the duration of the COVID-19 declaration under Se ction 564(b)(1) of the Act, 21 U.S.C. section 360bbb-3(b)(1), unless the authorization is terminated or revoked sooner.  Performed at [facility], [address] [place], [place] [address]      Labs: Basic Metabolic Panel: Recent Labs  Lab 07/28/20 1224 07/29/20 0605  NA 133* 133*  K 3.4* 3.4*  CL 99 102  CO2 24 25  GLUCOSE 95 110*  BUN <5* 5*  CREATININE 0.39* 0.40*  CALCIUM 9.1 8.5*   Liver Function  Tests: Recent Labs  Lab 07/28/20 1224  AST 30  ALT 14  ALKPHOS 57  BILITOT 4.8*  PROT 8.3*  ALBUMIN 4.3   No results for input(s): LIPASE, AMYLASE in the last 168 hours. No results for input(s): AMMONIA in the last 168 hours. CBC: Recent Labs  Lab 07/28/20 1224 07/29/20 0605 07/30/20 1113  WBC 16.8* 11.1* 12.1*  NEUTROABS 8.4*  --   --   HGB 8.1* 7.0* 7.3*  HCT 23.2* 19.7* 21.1*  MCV 99.1 99.0 99.1  PLT 325 266 348   Cardiac Enzymes: No results for input(s): CKTOTAL, CKMB, CKMBINDEX, TROPONINI in the last 168 hours. BNP: Invalid input(s): POCBNP CBG: No results for input(s): GLUCAP in the last 168 hours.  Time coordinating discharge: 35 minutes  Signed:  Nolon Nations  APRN, MSN, FNP-C Patient Care [facility] [address] [place], [place] [address] [phone]  [facility] 07/30/2020, 12:53 PM

## 2020-07-30 NOTE — Care Management Important Message (Signed)
Important Message  Patient Details IM Letter given to the Patient Name: Kara [name] MRN: [id number] Date of Birth: [birth year]   Medicare Important Message Given:  Yes     Caren Macadam 07/30/2020, 11:10 AM

## 2020-08-02 NOTE — Telephone Encounter (Signed)
Sent to Provider 

## 2020-08-10 ENCOUNTER — Ambulatory Visit: Payer: Medicare Other | Admitting: Family Medicine

## 2020-08-13 ENCOUNTER — Other Ambulatory Visit: Payer: Self-pay | Admitting: Family Medicine

## 2020-08-13 ENCOUNTER — Telehealth: Payer: Self-pay | Admitting: Family Medicine

## 2020-08-13 DIAGNOSIS — F1190 Opioid use, unspecified, uncomplicated: Secondary | ICD-10-CM

## 2020-08-13 DIAGNOSIS — D571 Sickle-cell disease without crisis: Secondary | ICD-10-CM

## 2020-08-13 DIAGNOSIS — G8929 Other chronic pain: Secondary | ICD-10-CM

## 2020-08-13 MED ORDER — OXYCODONE HCL 15 MG PO TABS
15.0000 mg | ORAL_TABLET | ORAL | 0 refills | Status: DC | PRN
Start: 2020-08-16 — End: 2020-08-26

## 2020-08-13 MED ORDER — OXYCODONE HCL ER 10 MG PO T12A: 10.0000 mg | EXTENDED_RELEASE_TABLET | Freq: Three times a day (TID) | ORAL | 0 refills | Status: DC | PRN

## 2020-08-16 ENCOUNTER — Ambulatory Visit: Payer: Medicare Other | Admitting: Family Medicine

## 2020-08-16 NOTE — Telephone Encounter (Signed)
Sent to provider 

## 2020-08-23 ENCOUNTER — Telehealth: Payer: Self-pay | Admitting: Family Medicine

## 2020-08-24 NOTE — Telephone Encounter (Signed)
Sent to provider 

## 2020-08-26 ENCOUNTER — Other Ambulatory Visit: Payer: Self-pay | Admitting: Family Medicine

## 2020-08-26 ENCOUNTER — Telehealth: Payer: Self-pay | Admitting: Family Medicine

## 2020-08-26 DIAGNOSIS — G8929 Other chronic pain: Secondary | ICD-10-CM

## 2020-08-26 DIAGNOSIS — D571 Sickle-cell disease without crisis: Secondary | ICD-10-CM

## 2020-08-26 DIAGNOSIS — F419 Anxiety disorder, unspecified: Secondary | ICD-10-CM

## 2020-08-26 DIAGNOSIS — F1190 Opioid use, unspecified, uncomplicated: Secondary | ICD-10-CM

## 2020-08-26 MED ORDER — OXYCODONE HCL 15 MG PO TABS
15.0000 mg | ORAL_TABLET | ORAL | 0 refills | Status: DC | PRN
Start: 2020-08-30 — End: 2020-09-03

## 2020-08-27 ENCOUNTER — Ambulatory Visit (INDEPENDENT_AMBULATORY_CARE_PROVIDER_SITE_OTHER): Payer: Medicare Other | Admitting: Family Medicine

## 2020-08-27 ENCOUNTER — Emergency Department (HOSPITAL_COMMUNITY): Payer: Medicare Other

## 2020-08-27 ENCOUNTER — Other Ambulatory Visit: Payer: Self-pay

## 2020-08-27 ENCOUNTER — Encounter: Payer: Self-pay | Admitting: Family Medicine

## 2020-08-27 ENCOUNTER — Inpatient Hospital Stay (HOSPITAL_COMMUNITY)
Admission: EM | Admit: 2020-08-27 | Discharge: 2020-09-03 | DRG: 811 | Disposition: A | Discharge status: Home / Self Care | Payer: Medicare Other | Attending: Internal Medicine | Admitting: Internal Medicine

## 2020-08-27 ENCOUNTER — Encounter (HOSPITAL_COMMUNITY): Payer: Self-pay | Admitting: Emergency Medicine

## 2020-08-27 VITALS — BP 105/54 | HR 99 | Temp 98.4°F | Resp 17 | Ht 64.0 in | Wt 122.0 lb

## 2020-08-27 DIAGNOSIS — Z885 Allergy status to narcotic agent status: Secondary | ICD-10-CM | POA: Diagnosis not present

## 2020-08-27 DIAGNOSIS — Z91018 Allergy to other foods: Secondary | ICD-10-CM | POA: Diagnosis not present

## 2020-08-27 DIAGNOSIS — L538 Other specified erythematous conditions: Secondary | ICD-10-CM | POA: Diagnosis not present

## 2020-08-27 DIAGNOSIS — Z888 Allergy status to other drugs, medicaments and biological substances status: Secondary | ICD-10-CM | POA: Diagnosis not present

## 2020-08-27 DIAGNOSIS — I272 Pulmonary hypertension, unspecified: Secondary | ICD-10-CM | POA: Diagnosis present

## 2020-08-27 DIAGNOSIS — J189 Pneumonia, unspecified organism: Secondary | ICD-10-CM | POA: Diagnosis present

## 2020-08-27 DIAGNOSIS — G8929 Other chronic pain: Secondary | ICD-10-CM | POA: Diagnosis not present

## 2020-08-27 DIAGNOSIS — I959 Hypotension, unspecified: Secondary | ICD-10-CM | POA: Diagnosis present

## 2020-08-27 DIAGNOSIS — F1120 Opioid dependence, uncomplicated: Secondary | ICD-10-CM | POA: Diagnosis present

## 2020-08-27 DIAGNOSIS — Z20822 Contact with and (suspected) exposure to covid-19: Secondary | ICD-10-CM | POA: Diagnosis present

## 2020-08-27 DIAGNOSIS — Z09 Encounter for follow-up examination after completed treatment for conditions other than malignant neoplasm: Secondary | ICD-10-CM

## 2020-08-27 DIAGNOSIS — D5700 Hb-SS disease with crisis, unspecified: Secondary | ICD-10-CM

## 2020-08-27 DIAGNOSIS — D5701 Hb-SS disease with acute chest syndrome: Principal | ICD-10-CM | POA: Diagnosis present

## 2020-08-27 DIAGNOSIS — D571 Sickle-cell disease without crisis: Secondary | ICD-10-CM

## 2020-08-27 DIAGNOSIS — F1190 Opioid use, unspecified, uncomplicated: Secondary | ICD-10-CM | POA: Diagnosis not present

## 2020-08-27 DIAGNOSIS — F419 Anxiety disorder, unspecified: Secondary | ICD-10-CM

## 2020-08-27 DIAGNOSIS — G894 Chronic pain syndrome: Secondary | ICD-10-CM | POA: Diagnosis present

## 2020-08-27 DIAGNOSIS — Z79899 Other long term (current) drug therapy: Secondary | ICD-10-CM | POA: Diagnosis not present

## 2020-08-27 DIAGNOSIS — F17200 Nicotine dependence, unspecified, uncomplicated: Secondary | ICD-10-CM | POA: Diagnosis present

## 2020-08-27 DIAGNOSIS — F411 Generalized anxiety disorder: Secondary | ICD-10-CM | POA: Diagnosis present

## 2020-08-27 DIAGNOSIS — R609 Edema, unspecified: Secondary | ICD-10-CM | POA: Diagnosis not present

## 2020-08-27 DIAGNOSIS — D72829 Elevated white blood cell count, unspecified: Secondary | ICD-10-CM | POA: Diagnosis present

## 2020-08-27 DIAGNOSIS — Z9049 Acquired absence of other specified parts of digestive tract: Secondary | ICD-10-CM | POA: Diagnosis not present

## 2020-08-27 DIAGNOSIS — Z791 Long term (current) use of non-steroidal anti-inflammatories (NSAID): Secondary | ICD-10-CM

## 2020-08-27 DIAGNOSIS — M79605 Pain in left leg: Secondary | ICD-10-CM

## 2020-08-27 DIAGNOSIS — K219 Gastro-esophageal reflux disease without esophagitis: Secondary | ICD-10-CM | POA: Diagnosis present

## 2020-08-27 DIAGNOSIS — M7989 Other specified soft tissue disorders: Secondary | ICD-10-CM

## 2020-08-27 LAB — COMPREHENSIVE METABOLIC PANEL
ALT: 21 U/L (ref 0–44)
AST: 86 U/L — ABNORMAL HIGH (ref 15–41)
Albumin: 4.2 g/dL (ref 3.5–5.0)
Alkaline Phosphatase: 57 U/L (ref 38–126)
Anion gap: 8 (ref 5–15)
BUN: 6 mg/dL (ref 6–20)
CO2: 24 mmol/L (ref 22–32)
Calcium: 8.4 mg/dL — ABNORMAL LOW (ref 8.9–10.3)
Chloride: 101 mmol/L (ref 98–111)
Creatinine, Ser: <0.3 mg/dL — ABNORMAL LOW (ref 0.44–1.00)
Glucose, Bld: 96 mg/dL (ref 70–99)
Potassium: 4.6 mmol/L (ref 3.5–5.1)
Sodium: 133 mmol/L — ABNORMAL LOW (ref 135–145)
Total Bilirubin: 9.4 mg/dL — ABNORMAL HIGH (ref 0.3–1.2)
Total Protein: 7.7 g/dL (ref 6.5–8.1)

## 2020-08-27 LAB — CBC WITH DIFFERENTIAL/PLATELET
Abs Immature Granulocytes: 0.6 10*3/uL — ABNORMAL HIGH (ref 0.00–0.07)
Basophils Absolute: 0.2 10*3/uL — ABNORMAL HIGH (ref 0.0–0.1)
Basophils Relative: 1 %
Eosinophils Absolute: 0.3 10*3/uL (ref 0.0–0.5)
Eosinophils Relative: 1 %
HCT: 24 % — ABNORMAL LOW (ref 36.0–46.0)
Hemoglobin: 8.1 g/dL — ABNORMAL LOW (ref 12.0–15.0)
Immature Granulocytes: 2 %
Lymphocytes Relative: 33 %
Lymphs Abs: 9.6 10*3/uL — ABNORMAL HIGH (ref 0.7–4.0)
MCH: 34.2 pg — ABNORMAL HIGH (ref 26.0–34.0)
MCHC: 33.8 g/dL (ref 30.0–36.0)
MCV: 101.3 fL — ABNORMAL HIGH (ref 80.0–100.0)
Monocytes Absolute: 1.6 10*3/uL — ABNORMAL HIGH (ref 0.1–1.0)
Monocytes Relative: 6 %
Neutro Abs: 16.8 10*3/uL — ABNORMAL HIGH (ref 1.7–7.7)
Neutrophils Relative %: 57 %
Platelets: 288 10*3/uL (ref 150–400)
RBC: 2.37 MIL/uL — ABNORMAL LOW (ref 3.87–5.11)
RDW: 24 % — ABNORMAL HIGH (ref 11.5–15.5)
WBC: 29 10*3/uL — ABNORMAL HIGH (ref 4.0–10.5)
nRBC: 0.5 % — ABNORMAL HIGH (ref 0.0–0.2)

## 2020-08-27 LAB — RETICULOCYTES
Immature Retic Fract: 34.5 % — ABNORMAL HIGH (ref 2.3–15.9)
RBC.: 2.37 MIL/uL — ABNORMAL LOW (ref 3.87–5.11)
Retic Count, Absolute: 647 10*3/uL — ABNORMAL HIGH (ref 19.0–186.0)
Retic Ct Pct: 14.7 % — ABNORMAL HIGH (ref 0.4–3.1)

## 2020-08-27 LAB — I-STAT BETA HCG BLOOD, ED (MC, WL, AP ONLY): I-stat hCG, quantitative: <5 m[IU]/mL (ref <5)

## 2020-08-27 LAB — D-DIMER, QUANTITATIVE: D-Dimer, Quant: 1.14 ug/mL-FEU — ABNORMAL HIGH (ref 0.00–0.50)

## 2020-08-27 MED ORDER — DEXTROSE-NACL 5-0.45 % IV SOLN: INTRAVENOUS | Status: DC

## 2020-08-27 MED ORDER — ONDANSETRON HCL 4 MG/2ML IJ SOLN
4.0000 mg | Freq: Four times a day (QID) | INTRAMUSCULAR | Status: DC | PRN
  Administered 2020-08-31: 4 mg via INTRAVENOUS
  Filled 2020-08-27: qty 2

## 2020-08-27 MED ORDER — SODIUM CHLORIDE 0.9 % IV SOLN
2.0000 g | INTRAVENOUS | Status: DC
  Administered 2020-08-28 – 2020-08-30 (×3): 2 g via INTRAVENOUS
  Filled 2020-08-27 (×3): qty 2

## 2020-08-27 MED ORDER — KETOROLAC TROMETHAMINE 15 MG/ML IJ SOLN
15.0000 mg | INTRAMUSCULAR | Status: AC
  Administered 2020-08-27: 15 mg via INTRAVENOUS
  Filled 2020-08-27: qty 1

## 2020-08-27 MED ORDER — SODIUM CHLORIDE 0.9% FLUSH: 9.0000 mL | INTRAVENOUS | Status: DC | PRN

## 2020-08-27 MED ORDER — IOHEXOL 350 MG/ML SOLN
100.0000 mL | Freq: Once | INTRAVENOUS | Status: AC | PRN
  Administered 2020-08-27: 100 mL via INTRAVENOUS

## 2020-08-27 MED ORDER — SODIUM CHLORIDE 0.9 % IV SOLN
500.0000 mg | Freq: Once | INTRAVENOUS | Status: AC
  Administered 2020-08-27: 500 mg via INTRAVENOUS
  Filled 2020-08-27: qty 500

## 2020-08-27 MED ORDER — SENNOSIDES-DOCUSATE SODIUM 8.6-50 MG PO TABS
1.0000 | ORAL_TABLET | Freq: Two times a day (BID) | ORAL | Status: DC
  Administered 2020-08-27 – 2020-09-03 (×9): 1 via ORAL
  Filled 2020-08-27 (×10): qty 1

## 2020-08-27 MED ORDER — HYDROMORPHONE 1 MG/ML IV SOLN
INTRAVENOUS | Status: DC
  Administered 2020-08-28: 30 mg via INTRAVENOUS
  Administered 2020-08-28: 3.18 mg via INTRAVENOUS
  Administered 2020-08-28: 2.12 mg via INTRAVENOUS
  Administered 2020-08-28: 4.24 mg via INTRAVENOUS
  Administered 2020-08-28: 2.12 mL via INTRAVENOUS
  Administered 2020-08-28: 3.71 mL via INTRAVENOUS
  Administered 2020-08-28: 30 mg via INTRAVENOUS
  Administered 2020-08-29: 3.18 mg via INTRAVENOUS
  Administered 2020-08-29 (×2): 5.3 mg via INTRAVENOUS
  Administered 2020-08-29: 3.38 mL via INTRAVENOUS
  Administered 2020-08-29 – 2020-08-30 (×2): 30 mg via INTRAVENOUS
  Administered 2020-08-30 (×2): 3.18 mg via INTRAVENOUS
  Administered 2020-08-30: 1.06 mg via INTRAVENOUS
  Administered 2020-08-31: 7.42 mg via INTRAVENOUS
  Filled 2020-08-27 (×3): qty 30

## 2020-08-27 MED ORDER — SODIUM CHLORIDE 0.9 % IV SOLN
500.0000 mg | INTRAVENOUS | Status: DC
  Administered 2020-08-28 – 2020-08-29 (×2): 500 mg via INTRAVENOUS
  Filled 2020-08-27 (×2): qty 500

## 2020-08-27 MED ORDER — POLYETHYLENE GLYCOL 3350 17 G PO PACK
17.0000 g | PACK | Freq: Every day | ORAL | Status: DC | PRN
  Administered 2020-08-28 – 2020-08-30 (×2): 17 g via ORAL
  Filled 2020-08-27 (×3): qty 1

## 2020-08-27 MED ORDER — HYDROMORPHONE HCL 2 MG/ML IJ SOLN
2.0000 mg | INTRAMUSCULAR | Status: AC
  Administered 2020-08-27: 2 mg via INTRAVENOUS
  Filled 2020-08-27: qty 1

## 2020-08-27 MED ORDER — NALOXONE HCL 0.4 MG/ML IJ SOLN: 0.4000 mg | INTRAMUSCULAR | Status: DC | PRN

## 2020-08-27 MED ORDER — KETOROLAC TROMETHAMINE 15 MG/ML IJ SOLN
15.0000 mg | Freq: Four times a day (QID) | INTRAMUSCULAR | Status: AC
  Administered 2020-08-27 – 2020-08-28 (×5): 15 mg via INTRAVENOUS
  Filled 2020-08-27 (×5): qty 1

## 2020-08-27 MED ORDER — SODIUM CHLORIDE 0.9 % IV SOLN
1.0000 g | Freq: Once | INTRAVENOUS | Status: AC
  Administered 2020-08-27: 1 g via INTRAVENOUS
  Filled 2020-08-27: qty 10

## 2020-08-27 MED ORDER — ENOXAPARIN SODIUM 40 MG/0.4ML ~~LOC~~ SOLN
40.0000 mg | SUBCUTANEOUS | Status: DC
  Filled 2020-08-27: qty 0.4

## 2020-08-27 NOTE — ED Notes (Signed)
Patient appears to be very sedated-able to arouse verbally

## 2020-08-27 NOTE — Telephone Encounter (Signed)
Sent to provider 

## 2020-08-27 NOTE — H&P (Signed)
History and Physical    Mickelle Goupil KNL:[id number] DOB: 1986/09/20 DOA: 08/27/2020  PCP: [name], FNP  Patient coming from: Home.  Chief Complaint: Pain.  HPI: Kara [name] is a 34 y.o. female with history of sickle cell anemia presents to the ER with complaints of pain over the last 2 days.  Pain is particularly of the chest radiating to the back joints both upper and lower extremities which has been persistent despite taking her home medications.  Denies any nausea vomiting or diarrhea headache or any focal deficits.  ED Course: In the ER patient had a temperature of 99 F and was mildly hypoxic.  CT angiogram of the chest was showing features concerning for pneumonia versus acute chest syndrome.  EKG was showing sinus tachycardia.  Covid test was negative.  Lab work was significant for leukocytosis of 29,000 hemoglobin 8.1.  Patient was started on empiric antibiotics and pain relief medication admitted for sickle cell pain crisis with possible chest syndrome.  Review of Systems: As per HPI, rest all negative.   Past Medical History:  Diagnosis Date  . Arthritis   . Chronic migraine   . GERD (gastroesophageal reflux disease)   . Hx of cholecystectomy 2015  . Pulmonary hypertension (HCC)   . Sickle cell anemia (HCC)   . Tendinitis    left elbow    Past Surgical History:  Procedure Laterality Date  . DILATION AND CURETTAGE OF UTERUS    . GALLBLADDER SURGERY       reports that she has been smoking. She has never used smokeless tobacco. She reports current drug use. Drug: Marijuana. She reports that she does not drink alcohol.  Allergies  Allergen Reactions  . Kiwi Extract Hives  . Morphine And Related Hives  . Paroxetine Hives    Family History  Adopted: Yes    Prior to Admission medications   Medication Sig Start Date End Date Taking? Authorizing Provider  butalbital-acetaminophen-caffeine (FIORICET) 50-325-40 MG tablet Take 1 tablet by  mouth every 6 (six) hours as needed for headache. 06/11/20  Yes [name], FNP  cetirizine (ZYRTEC) 10 MG tablet Take 1 tablet (10 mg total) by mouth daily. Patient taking differently: Take 10 mg by mouth daily as needed for allergies.  04/14/20  Yes [name], FNP  Ensure Plus (ENSURE PLUS) LIQD Take 237 mLs by mouth 3 (three) times daily between meals. Patient taking differently: Take 237 mLs by mouth daily as needed (meal replacement).  08/26/19  Yes [name], FNP  folic acid (FOLVITE) 1 MG tablet Take 1 tablet (1 mg total) by mouth daily. 04/14/20  Yes [name], FNP  hydrOXYzine (ATARAX/VISTARIL) 10 MG tablet Take 10 mg by mouth in the morning and at bedtime.   Yes [provider]  methocarbamol (ROBAXIN) 500 MG tablet Take 1 tablet (500 mg total) by mouth in the morning and at bedtime. Patient taking differently: Take 500 mg by mouth in the morning, at noon, in the evening, and at bedtime.  04/14/20  Yes [name], FNP  naproxen (NAPROSYN) 500 MG tablet Take 1 tablet (500 mg total) by mouth 2 (two) times daily with a meal. Patient taking differently: Take 500 mg by mouth in the morning and at bedtime.  07/14/19  Yes [name], FNP  omeprazole (PRILOSEC) 40 MG capsule Take 1 capsule (40 mg total) by mouth daily. 04/14/20  Yes [name], FNP  oxyCODONE (OXYCONTIN) 10 mg  12 hr tablet Take 1 tablet (10 mg total) by mouth every 8 (eight) hours as needed. Patient taking differently: Take 10 mg by mouth every 4 (four) hours as needed (pain).  08/18/20 09/17/20 Yes [name], FNP  oxyCODONE (ROXICODONE) 15 MG immediate release tablet Take 1 tablet (15 mg total) by mouth every 4 (four) hours as needed for up to 15 days. Patient taking differently: Take 15 mg by mouth every 4 (four) hours as needed for pain.  08/30/20 09/14/20 Yes [name], FNP  Benzocaine 20 % OINT Apply 1 application topically 4 (four) times daily as  needed. Patient not taking: Reported on 04/13/2020 04/02/20   Barbette Merino, NP  Vitamin D, Ergocalciferol, (DRISDOL) 1.25 MG (50000 UNIT) CAPS capsule Take 1 capsule (50,000 Units total) by mouth every 7 (seven) days. Patient taking differently: Take 50,000 Units by mouth every 7 (seven) days. Mondays 04/14/20   [name], FNP    Physical Exam: Constitutional: Moderately built and nourished. Vitals:   08/27/20 2100 08/27/20 2200 08/27/20 2215 08/27/20 2245  BP: 117/62 (!) 96/52 (!) 95/54 (!) 111/54  Pulse: 93 82 78 66  Resp: 13 17 13 14   Temp:      TempSrc:      SpO2: 95% 100% 100% 100%  Weight:       Eyes: Anicteric no pallor. ENMT: No discharge from the ears eyes nose or mouth. Neck: No mass felt.  No neck rigidity. Respiratory: No rhonchi or crepitations. Cardiovascular: S1-S2 heard. Abdomen: Soft nontender bowel sounds present. Musculoskeletal: No edema. Skin: No rash. Neurologic: Alert awake oriented to time place and person.  Moves all extremities. Psychiatric: Appears normal.  Normal affect.   Labs on Admission: I have personally reviewed following labs and imaging studies  CBC: Recent Labs  Lab 08/27/20 1605  WBC 29.0*  NEUTROABS 16.8*  HGB 8.1*  HCT 24.0*  MCV 101.3*  PLT 288   Basic Metabolic Panel: Recent Labs  Lab 08/27/20 1605  NA 133*  K 4.6  CL 101  CO2 24  GLUCOSE 96  BUN 6  CREATININE <0.30*  CALCIUM 8.4*   GFR: CrCl cannot be calculated (This lab value cannot be used to calculate CrCl because it is not a number: <0.30). Liver Function Tests: Recent Labs  Lab 08/27/20 1605  AST 86*  ALT 21  ALKPHOS 57  BILITOT 9.4*  PROT 7.7  ALBUMIN 4.2   No results for input(s): LIPASE, AMYLASE in the last 168 hours. No results for input(s): AMMONIA in the last 168 hours. Coagulation Profile: No results for input(s): INR, PROTIME in the last 168 hours. Cardiac Enzymes: No results for input(s): CKTOTAL, CKMB, CKMBINDEX, TROPONINI in  the last 168 hours. BNP (last 3 results) No results for input(s): PROBNP in the last 8760 hours. HbA1C: No results for input(s): HGBA1C in the last 72 hours. CBG: No results for input(s): GLUCAP in the last 168 hours. Lipid Profile: No results for input(s): CHOL, HDL, LDLCALC, TRIG, CHOLHDL, LDLDIRECT in the last 72 hours. Thyroid Function Tests: No results for input(s): TSH, T4TOTAL, FREET4, T3FREE, THYROIDAB in the last 72 hours. Anemia Panel: Recent Labs    08/27/20 1605  RETICCTPCT 14.7*   Urine analysis:    Component Value Date/Time   COLORURINE AMBER (A) 07/28/2020 1715   APPEARANCEUR CLEAR 07/28/2020 1715   LABSPEC 1.010 07/28/2020 1715   PHURINE 5.0 07/28/2020 1715   GLUCOSEU NEGATIVE 07/28/2020 1715   HGBUR NEGATIVE 07/28/2020 1715  BILIRUBINUR NEGATIVE 07/28/2020 1715   BILIRUBINUR small 06/11/2020 1217   KETONESUR NEGATIVE 07/28/2020 1715   PROTEINUR NEGATIVE 07/28/2020 1715   UROBILINOGEN >=8.0 (A) 06/11/2020 1217   NITRITE NEGATIVE 07/28/2020 1715   LEUKOCYTESUR NEGATIVE 07/28/2020 1715   Sepsis Labs: @LABRCNTIP (procalcitonin:4,lacticidven:4) )No results found for this or any previous visit (from the past 240 hour(s)).   Radiological Exams on Admission: DG Chest 2 View  Result Date: 08/27/2020 CLINICAL DATA:  Pain, sickle cell crisis EXAM: CHEST - 2 VIEW COMPARISON:  06/15/2020 FINDINGS: Enlargement of cardiac silhouette with pulmonary vascular congestion. Hazy infiltrates in the mid to upper lungs question pneumonia or edema. No pleural effusion or pneumothorax. Osseous structures unremarkable. IMPRESSION: Hazy infiltrates in mid to upper lungs question edema or pneumonia. Electronically Signed   By: 06/17/2020 M.D.   On: 08/27/2020 17:05    EKG: Independently reviewed.  Sinus tachycardia.  Assessment/Plan Principal Problem:   Sickle cell pain crisis (HCC) Active Problems:   Pulmonary hypertension (HCC)    1. Sickle cell pain crisis with possible  developing acute chest syndrome for which patient has been started on antibiotics Dilaudid PCA IV fluids closely monitor in telemetry.  Toradol. 2. Anemia secondary to sickle cell disease -hemoglobin appears to be at baseline follow CBC. 3. Leukocytosis could be reactionary however since patient has possibility of acute chest syndrome and possible pneumonia on empiric antibiotics at this time.  Since patient has sickle cell pain crisis with possible acute chest syndrome and is on Dilaudid PCA will need close monitoring for any further worsening in inpatient status.   DVT prophylaxis: Lovenox. Code Status: Full code. Family Communication: Discussed with patient. Disposition Plan: Home. Consults called: None. Admission status: Inpatient.   10/27/2020 MD [facility] Pager [phone].  If 7PM-7AM, please contact night-coverage [email or web address] Password [facility]  08/27/2020, 11:24 PM

## 2020-08-27 NOTE — ED Provider Notes (Signed)
[facility]-EMERGENCY DEPT Provider Note   CSN: [id number] Arrival date & time: 08/27/20  1501     History Chief Complaint  Patient presents with  . Sickle Cell Pain Crisis    Linday Rhodes is a 34 y.o. female.  Patient is a 34 year old female with history of sickle cell anemia who presents with a sickle cell pain crises.  She says it started yesterday and got worse today.  She has been taking her home medications without improvement in symptoms.  She describes pain in her chest and all over.  She says this is a typical pain crises for her.  She denies any shortness of breath other than her baseline shortness of breath.  She states that she is a smoker and always has a little bit of shortness of breath.  She is not on home oxygen.  She denies any fevers or other recent illnesses.        Past Medical History:  Diagnosis Date  . Arthritis   . Chronic migraine   . GERD (gastroesophageal reflux disease)   . Hx of cholecystectomy 2015  . Pulmonary hypertension (HCC)   . Sickle cell anemia (HCC)   . Tendinitis    left elbow    Patient Active Problem List   Diagnosis Date Noted  . Abscess of right buttock 04/17/2020  . 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 Sickle cell anemia with pain (HCC) 12/08/2019  . Decreased appetite 08/28/2019  . Chronic pain of right knee 08-06-2019  . Gastroesophageal reflux disease without esophagitis 2019/08/06  . Anxiety 08-06-19  . Death of family member 08-06-2019  . Chronic, continuous use of opioids August 06, 2019  . Headache, new daily persistent ([facility]) 07/04/2019  . Sickle cell anemia (HCC) 07/01/2019  . Chronic pain syndrome 12/27/2018  . Anemia of chronic disease 12/27/2018  . Pulmonary hypertension (HCC) 04/04/2018  . Sickle cell disease (HCC) 04/04/2018  . Arthritis 04/04/2018  . Chest pain on breathing   . Leukocytosis 02/06/2018  . Sickle cell crisis (HCC) 02/05/2018  . Sickle cell pain crisis (HCC)  10/29/2017    Past Surgical History:  Procedure Laterality Date  . DILATION AND CURETTAGE OF UTERUS    . GALLBLADDER SURGERY       OB History    Gravida  1   Para      Term      Preterm      AB  1   Living        SAB  1   TAB      Ectopic      Multiple      Live Births              Family History  Adopted: Yes    Social History   Tobacco Use  . Smoking status: Current Every Day Smoker  . Smokeless tobacco: Never Used  Vaping Use  . Vaping Use: Never used  Substance Use Topics  . Alcohol use: No  . Drug use: Yes    Types: Marijuana    Home Medications Prior to Admission medications   Medication Sig Start Date End Date Taking? Authorizing Provider  butalbital-acetaminophen-caffeine (FIORICET) 50-325-40 MG tablet Take 1 tablet by mouth every 6 (six) hours as needed for headache. 06/11/20  Yes [name], FNP  cetirizine (ZYRTEC) 10 MG tablet Take 1 tablet (10 mg total) by mouth daily. Patient taking differently: Take 10 mg by mouth daily as needed for allergies.  04/14/20  Yes [name], FNP  Ensure Plus (ENSURE PLUS) LIQD Take 237 mLs by mouth 3 (three) times daily between meals. Patient taking differently: Take 237 mLs by mouth daily as needed (meal replacement).  08/26/19  Yes [name], FNP  folic acid (FOLVITE) 1 MG tablet Take 1 tablet (1 mg total) by mouth daily. 04/14/20  Yes [name], FNP  hydrOXYzine (ATARAX/VISTARIL) 10 MG tablet Take 10 mg by mouth in the morning and at bedtime.   Yes [provider]  methocarbamol (ROBAXIN) 500 MG tablet Take 1 tablet (500 mg total) by mouth in the morning and at bedtime. Patient taking differently: Take 500 mg by mouth in the morning, at noon, in the evening, and at bedtime.  04/14/20  Yes [name], FNP  naproxen (NAPROSYN) 500 MG tablet Take 1 tablet (500 mg total) by mouth 2 (two) times daily with a meal. Patient taking differently: Take 500 mg by mouth in  the morning and at bedtime.  07/14/19  Yes [name], FNP  omeprazole (PRILOSEC) 40 MG capsule Take 1 capsule (40 mg total) by mouth daily. 04/14/20  Yes [name], FNP  oxyCODONE (OXYCONTIN) 10 mg 12 hr tablet Take 1 tablet (10 mg total) by mouth every 8 (eight) hours as needed. Patient taking differently: Take 10 mg by mouth every 4 (four) hours as needed (pain).  08/18/20 09/17/20 Yes [name], FNP  oxyCODONE (ROXICODONE) 15 MG immediate release tablet Take 1 tablet (15 mg total) by mouth every 4 (four) hours as needed for up to 15 days. Patient taking differently: Take 15 mg by mouth every 4 (four) hours as needed for pain.  08/30/20 09/14/20 Yes [name], FNP  Benzocaine 20 % OINT Apply 1 application topically 4 (four) times daily as needed. Patient not taking: Reported on 04/13/2020 04/02/20   Barbette Merino, NP  Vitamin D, Ergocalciferol, (DRISDOL) 1.25 MG (50000 UNIT) CAPS capsule Take 1 capsule (50,000 Units total) by mouth every 7 (seven) days. Patient taking differently: Take 50,000 Units by mouth every 7 (seven) days. Mondays 04/14/20   [name], FNP    Allergies    Kiwi extract, Morphine and related, and Paroxetine  Review of Systems   Review of Systems  Constitutional: Negative for chills, diaphoresis, fatigue and fever.  HENT: Negative for congestion, rhinorrhea and sneezing.   Eyes: Negative.   Respiratory: Negative for cough, chest tightness and shortness of breath.   Cardiovascular: Positive for chest pain. Negative for leg swelling.  Gastrointestinal: Negative for abdominal pain, blood in stool, diarrhea, nausea and vomiting.  Genitourinary: Negative for difficulty urinating, flank pain, frequency and hematuria.  Musculoskeletal: Positive for arthralgias. Negative for back pain.  Skin: Negative for rash.  Neurological: Negative for dizziness, speech difficulty, weakness, numbness and headaches.    Physical Exam Updated Vital  Signs BP (!) 111/54   Pulse 66   Temp 99 F (37.2 C) (Oral)   Resp 14   Wt 53.1 kg   LMP 08/09/2020 (Approximate)   SpO2 100%   BMI 20.08 kg/m   Physical Exam Constitutional:      Appearance: She is well-developed.  HENT:     Head: Normocephalic and atraumatic.  Eyes:     Pupils: Pupils are equal, round, and reactive to light.  Cardiovascular:     Rate and Rhythm: Normal rate and regular rhythm.     Heart sounds: Normal heart sounds.  Pulmonary:  Effort: Pulmonary effort is normal. No respiratory distress.     Breath sounds: Normal breath sounds. No wheezing or rales.  Chest:     Chest wall: No tenderness.  Abdominal:     General: Bowel sounds are normal.     Palpations: Abdomen is soft.     Tenderness: There is no abdominal tenderness. There is no guarding or rebound.  Musculoskeletal:        General: Normal range of motion.     Cervical back: Normal range of motion and neck supple.     Comments: No joint swelling or erythema  Lymphadenopathy:     Cervical: No cervical adenopathy.  Skin:    General: Skin is warm and dry.     Findings: No rash.  Neurological:     Mental Status: She is alert and oriented to person, place, and time.     ED Results / Procedures / Treatments   Labs (all labs ordered are listed, but only abnormal results are displayed) Labs Reviewed  COMPREHENSIVE METABOLIC PANEL - Abnormal; Notable for the following components:      Result Value   Sodium 133 (*)    Creatinine, Ser <0.30 (*)    Calcium 8.4 (*)    AST 86 (*)    Total Bilirubin 9.4 (*)    All other components within normal limits  CBC WITH DIFFERENTIAL/PLATELET - Abnormal; Notable for the following components:   WBC 29.0 (*)    RBC 2.37 (*)    Hemoglobin 8.1 (*)    HCT 24.0 (*)    MCV 101.3 (*)    MCH 34.2 (*)    RDW 24.0 (*)    nRBC 0.5 (*)    Neutro Abs 16.8 (*)    Lymphs Abs 9.6 (*)    Monocytes Absolute 1.6 (*)    Basophils Absolute 0.2 (*)    Abs Immature  Granulocytes 0.60 (*)    All other components within normal limits  RETICULOCYTES - Abnormal; Notable for the following components:   Retic Ct Pct 14.7 (*)    RBC. 2.37 (*)    Retic Count, Absolute 647.0 (*)    Immature Retic Fract 34.5 (*)    All other components within normal limits  D-DIMER, QUANTITATIVE (NOT AT [facility]) - Abnormal; Notable for the following components:   D-Dimer, Quant 1.14 (*)    All other components within normal limits  RESPIRATORY PANEL BY RT PCR (FLU A&B, COVID)  I-STAT BETA HCG BLOOD, ED ([facility], [facility], [facility] ONLY)    EKG EKG Interpretation  Date/Time:  Friday August 27 2020 15:16:31 EDT Ventricular Rate:  101 PR Interval:    QRS Duration: 84 QT Interval:  357 QTC Calculation: 463 R Axis:   74 Text Interpretation: Sinus tachycardia Right atrial enlargement Nonspecific T abnormalities, lateral leads Baseline wander in lead(s) V3 Partial missing lead(s): V3 12 Lead; Mason-Likar since last tracing no significant change Confirmed by Rolan Bucco [phone]) on 08/27/2020 4:29:49 PM   Radiology DG Chest 2 View  Result Date: 08/27/2020 CLINICAL DATA:  Pain, sickle cell crisis EXAM: CHEST - 2 VIEW COMPARISON:  06/15/2020 FINDINGS: Enlargement of cardiac silhouette with pulmonary vascular congestion. Hazy infiltrates in the mid to upper lungs question pneumonia or edema. No pleural effusion or pneumothorax. Osseous structures unremarkable. IMPRESSION: Hazy infiltrates in mid to upper lungs question edema or pneumonia. Electronically Signed   By: Ulyses Southward M.D.   On: 08/27/2020 17:05    Procedures Procedures (including critical care time)  Medications Ordered in ED Medications  dextrose 5 %-0.45 % sodium chloride infusion ( Intravenous New Bag/Given 08/27/20 1707)  azithromycin (ZITHROMAX) 500 mg in sodium chloride 0.9 % 250 mL IVPB (has no administration in time range)  iohexol (OMNIPAQUE) 350 MG/ML injection 100 mL (has no administration in time range)  ketorolac  (TORADOL) 15 MG/ML injection 15 mg (15 mg Intravenous Given 08/27/20 1707)  HYDROmorphone (DILAUDID) injection 2 mg (2 mg Intravenous Given 08/27/20 1748)  HYDROmorphone (DILAUDID) injection 2 mg (2 mg Intravenous Given 08/27/20 1827)  cefTRIAXone (ROCEPHIN) 1 g in sodium chloride 0.9 % 100 mL IVPB (1 g Intravenous New Bag/Given 08/27/20 2225)    ED Course  I have reviewed the triage vital signs and the nursing notes.  Pertinent labs & imaging results that were available during my care of the patient were reviewed by me and considered in my medical decision making (see chart for details).    MDM Rules/Calculators/A&P                          Patient is a 34 year old female who presents with sickle cell pain crises.  Pain protocol was started and she is feeling much better.  She denies any current shortness of breath.  Her chest x-ray is clear without any evidence of acute chest syndrome.  Her WBC count is elevated.  Her chest x-ray shows possible infiltrate versus edema.  She has some mild hypoxia.  She will drop down to about 89 to 90% on room air and has been placed on 2 L/min of oxygen.  Her symptoms are most consistent with either pneumonia or acute chest syndrome.  Her D-dimer is mildly elevated.  CT chest is pending.  I spoke with Dr. Toniann FailKakrakandy who will admit the patient for further treatment.      Final Clinical Impression(s) / ED Diagnoses Final diagnoses:  Sickle cell pain crisis [facility](HCC)    Rx / DC Orders ED Discharge Orders    None       Rolan BuccoBelfi, Eleftheria Taborn, MD 08/27/20 2314

## 2020-08-27 NOTE — ED Triage Notes (Addendum)
Per pt, states she is having a sickle cell crisis-complaining of CP since last night-patient appears to be medicated, slow to respond, lethargy

## 2020-08-28 ENCOUNTER — Encounter (HOSPITAL_COMMUNITY): Payer: Self-pay | Admitting: Internal Medicine

## 2020-08-28 DIAGNOSIS — D5700 Hb-SS disease with crisis, unspecified: Secondary | ICD-10-CM | POA: Diagnosis not present

## 2020-08-28 LAB — TROPONIN I (HIGH SENSITIVITY)
Troponin I (High Sensitivity): 8 ng/L (ref <18)
Troponin I (High Sensitivity): 10 ng/L (ref <18)

## 2020-08-28 LAB — RESPIRATORY PANEL BY RT PCR (FLU A&B, COVID)
Influenza A by PCR: NEGATIVE
Influenza B by PCR: NEGATIVE
SARS Coronavirus 2 by RT PCR: NEGATIVE

## 2020-08-28 NOTE — Progress Notes (Signed)
°   08/28/20 0841  Assess: MEWS Score  Temp 98.1 F (36.7 C)  BP (!) 80/42  Pulse Rate 65  Resp 20  SpO2 100 %  Assess: MEWS Score  MEWS Temp 0  MEWS Systolic 2  MEWS Pulse 0  MEWS RR 0  MEWS LOC 0  MEWS Score 2  MEWS Score Color Yellow  Assess: if the MEWS score is Yellow or Red  Were vital signs taken at a resting state? Yes  Focused Assessment No change from prior assessment  Early Detection of Sepsis Score *See Row Information* Low  MEWS guidelines implemented *See Row Information* Yes  Treat  MEWS Interventions Escalated (See documentation below)  Take Vital Signs  Increase Vital Sign Frequency  Yellow: Q 2hr X 2 then Q 4hr X 2, if remains yellow, continue Q 4hrs  Escalate  MEWS: Escalate Yellow: discuss with charge nurse/RN and consider discussing with provider and RRT  Notify: Charge Nurse/RN  Name of Charge Nurse/RN Notified Shon Hale  Date Charge Nurse/RN Notified 08/28/20  Time Charge Nurse/RN Notified 1540  Notify: Provider  Provider Name/Title Dr. Mikeal [facility]  Date Provider Notified 08/28/20  Time Provider Notified [phone]  Notification Type Page  Notification Reason Other (Comment) (low BP; yellow MEWS)  Response No new orders  Date of Provider Response 08/28/20  Time of Provider Response 1000

## 2020-08-28 NOTE — Progress Notes (Signed)
Subjective: Patient is a 34 year old with known history of sickle cell disease admitted yesterday with acute sickle cell crisis and early acute chest syndrome.  She is still having some leukocytosis fever and shortness of breath.  She also has pain at 9 out of 10.  Has been using Dilaudid PCA Toradol IV fluids and antibiotics.  Objective: Vital signs in last 24 hours: Temp:  [98.4 F (36.9 C)-99 F (37.2 C)] 98.4 F (36.9 C) (10/09 0506) Pulse Rate:  [65-106] 70 (10/09 0524) Resp:  [10-18] 14 (10/09 0400) BP: (89-122)/(51-75) 91/52 (10/09 0524) SpO2:  [90 %-100 %] 98 % (10/09 0506) Weight:  [53.1 kg-55.3 kg] 54.4 kg (10/09 0051) Weight change:  Last BM Date: 08/27/20  Intake/Output from previous day: No intake/output data recorded. Intake/Output this shift: No intake/output data recorded.  General appearance: alert, cooperative, appears stated age and no distress Neck: no adenopathy, no carotid bruit, no JVD, supple, symmetrical, trachea midline and thyroid not enlarged, symmetric, no tenderness/mass/nodules Back: symmetric, no curvature. ROM normal. No CVA tenderness. Resp: diminished breath sounds bilaterally and rales bilaterally Cardio: regular rate and rhythm, S1, S2 normal, no murmur, click, rub or gallop GI: soft, non-tender; bowel sounds normal; no masses,  no organomegaly Extremities: extremities normal, atraumatic, no cyanosis or edema Pulses: 2+ and symmetric Skin: Skin color, texture, turgor normal. No rashes or lesions Neurologic: Grossly normal  Lab Results: Recent Labs    08/27/20 1605  WBC 29.0*  HGB 8.1*  HCT 24.0*  PLT 288   BMET Recent Labs    08/27/20 1605  NA 133*  K 4.6  CL 101  CO2 24  GLUCOSE 96  BUN 6  CREATININE <0.30*  CALCIUM 8.4*    Studies/Results: DG Chest 2 View  Result Date: 08/27/2020 CLINICAL DATA:  Pain, sickle cell crisis EXAM: CHEST - 2 VIEW COMPARISON:  06/15/2020 FINDINGS: Enlargement of cardiac silhouette with  pulmonary vascular congestion. Hazy infiltrates in the mid to upper lungs question pneumonia or edema. No pleural effusion or pneumothorax. Osseous structures unremarkable. IMPRESSION: Hazy infiltrates in mid to upper lungs question edema or pneumonia. Electronically Signed   By: Ulyses Southward M.D.   On: 08/27/2020 17:05   CT Angio Chest PE W/Cm &/Or Wo Cm  Result Date: 08/27/2020 CLINICAL DATA:  PE suspected, positive D-dimer, sickle cell crisis EXAM: CT ANGIOGRAPHY CHEST WITH CONTRAST TECHNIQUE: Multidetector CT imaging of the chest was performed using the standard protocol during bolus administration of intravenous contrast. Multiplanar CT image reconstructions and MIPs were obtained to evaluate the vascular anatomy. CONTRAST:  OMNIPAQUE IOHEXOL 350 MG/ML SOLN COMPARISON:  Chest radiograph 08/27/2020 FINDINGS: Cardiovascular: Satisfactory opacification the pulmonary arteries to the segmental level. No pulmonary artery filling defects are identified. Central pulmonary arteries are normal caliber. Normal heart size. No pericardial effusion. The aortic root is suboptimally assessed given cardiac pulsation artifact. The aorta is normal caliber. No acute luminal abnormality of the imaged aorta. No periaortic stranding or hemorrhage. Shared origin of the brachiocephalic and left common carotid arteries. Proximal great vessels are otherwise unremarkable. No major venous abnormalities. Mediastinum/Nodes: Soft tissue attenuation in the anterior mediastinum, may reflect thymic remnant in a patient of this age. No mediastinal fluid or gas. Normal thyroid gland and thoracic inlet. No acute abnormality of the trachea or esophagus. No worrisome mediastinal, hilar or axillary adenopathy. Lungs/Pleura: Some peripheral ground-glass opacity seen in the anti dependent portions of the lungs could reflect developing airspace disease or atypical infection with dependent atelectatic changes posteriorly. No  pneumothorax or  visible effusion. No convincing CT features of edema. No worrisome pulmonary nodules or masses. Upper Abdomen: Partially calcified, auto infarcted spleen is noted. Prior cholecystectomy. No acute abnormalities present in the visualized portions of the upper abdomen. Musculoskeletal: Inferior endplate deformity T7 and T11 with a pincer type deformity of T10, could reflect prior compression fractures versus developing vertebral manifestation sickle cell disease, typically a result of microvascular endplate infarct. No other acute or worrisome osseous abnormalities. No suspicious chest wall lesions. Review of the MIP images confirms the above findings. IMPRESSION: 1. No evidence of pulmonary embolism. 2. Some peripheral ground-glass opacity in the anti dependent portions of the lungs could reflect developing airspace disease or atypical infection with dependent atelectatic changes posteriorly. Etiology considerations including acute chest syndrome as well as viral etiologies including COVID-19. 3. Partially calcified, auto infarcted spleen. Compatible with typical sequela of sickle cell disease. 4. Inferior endplate deformity T7 and T11 with a pincer type deformity of T10, could reflect prior compression fractures versus developing vertebral manifestations sickle cell disease, typically the result of microvascular endplate infarct. 5. Soft tissue attenuation in the anterior mediastinum, may reflect thymic remnant in a patient of this age. Electronically Signed   By: Kreg Shropshire M.D.   On: 08/27/2020 23:37    Medications: I have reviewed the patient's current medications.  Assessment/Plan: 34 year old female admitted with sickle cell crisis and possible acute chest syndrome.  #1 acute chest syndrome: Secondary to sickle cell crisis.  Patient will be admitted.  Initiated on IV antibiotics.  Also Dilaudid IV fluids and other treatments.  #2 sickle cell painful crisis: Continue with treatment as above with  Dilaudid PCA and fluids.  #3 leukocytosis: Secondary to crisis and possible acute chest syndrome.  Continue treatment.  #4 mild hypotension: Aggressively hydrate.  LOS: 1 day   Bryn Saline,LAWAL 08/28/2020, 8:29 AM

## 2020-08-29 DIAGNOSIS — D5700 Hb-SS disease with crisis, unspecified: Secondary | ICD-10-CM | POA: Diagnosis not present

## 2020-08-29 MED ORDER — KETOROLAC TROMETHAMINE 15 MG/ML IJ SOLN
15.0000 mg | Freq: Four times a day (QID) | INTRAMUSCULAR | Status: AC
  Administered 2020-08-29 – 2020-08-31 (×5): 15 mg via INTRAVENOUS
  Filled 2020-08-29 (×5): qty 1

## 2020-08-29 NOTE — Progress Notes (Signed)
Subjective: Patient is doing much better today.  Pain is down to 6 out of 10.  She is breathing better as.  Oxygen sats 100% currently on 2 L.  She has some pleuritic chest pain but largely better and no new complaint.  Objective: Vital signs in last 24 hours: Temp:  [98 F (36.7 C)-98.9 F (37.2 C)] 98.1 F (36.7 C) (10/10 2139) Pulse Rate:  [68-81] 81 (10/10 2139) Resp:  [12-20] 20 (10/10 2139) BP: (90-104)/(48-60) 104/54 (10/10 2139) SpO2:  [97 %-100 %] 100 % (10/10 2139) Weight change:  Last BM Date: 08/29/20  Intake/Output from previous day: 10/09 0701 - 10/10 0700 In: 2665.3 [P.O.:240; I.V.:2425.3] Out: -  Intake/Output this shift: No intake/output data recorded.  General appearance: alert, cooperative, appears stated age and no distress Neck: no adenopathy, no carotid bruit, no JVD, supple, symmetrical, trachea midline and thyroid not enlarged, symmetric, no tenderness/mass/nodules Back: symmetric, no curvature. ROM normal. No CVA tenderness. Resp: diminished breath sounds bilaterally and rales bilaterally Cardio: regular rate and rhythm, S1, S2 normal, no murmur, click, rub or gallop GI: soft, non-tender; bowel sounds normal; no masses,  no organomegaly Extremities: extremities normal, atraumatic, no cyanosis or edema Pulses: 2+ and symmetric Skin: Skin color, texture, turgor normal. No rashes or lesions Neurologic: Grossly normal  Lab Results: Recent Labs    08/27/20 1605  WBC 29.0*  HGB 8.1*  HCT 24.0*  PLT 288   BMET Recent Labs    08/27/20 1605  NA 133*  K 4.6  CL 101  CO2 24  GLUCOSE 96  BUN 6  CREATININE <0.30*  CALCIUM 8.4*    Studies/Results: CT Angio Chest PE W/Cm &/Or Wo Cm  Result Date: 08/27/2020 CLINICAL DATA:  PE suspected, positive D-dimer, sickle cell crisis EXAM: CT ANGIOGRAPHY CHEST WITH CONTRAST TECHNIQUE: Multidetector CT imaging of the chest was performed using the standard protocol during bolus administration of intravenous  contrast. Multiplanar CT image reconstructions and MIPs were obtained to evaluate the vascular anatomy. CONTRAST:  OMNIPAQUE IOHEXOL 350 MG/ML SOLN COMPARISON:  Chest radiograph 08/27/2020 FINDINGS: Cardiovascular: Satisfactory opacification the pulmonary arteries to the segmental level. No pulmonary artery filling defects are identified. Central pulmonary arteries are normal caliber. Normal heart size. No pericardial effusion. The aortic root is suboptimally assessed given cardiac pulsation artifact. The aorta is normal caliber. No acute luminal abnormality of the imaged aorta. No periaortic stranding or hemorrhage. Shared origin of the brachiocephalic and left common carotid arteries. Proximal great vessels are otherwise unremarkable. No major venous abnormalities. Mediastinum/Nodes: Soft tissue attenuation in the anterior mediastinum, may reflect thymic remnant in a patient of this age. No mediastinal fluid or gas. Normal thyroid gland and thoracic inlet. No acute abnormality of the trachea or esophagus. No worrisome mediastinal, hilar or axillary adenopathy. Lungs/Pleura: Some peripheral ground-glass opacity seen in the anti dependent portions of the lungs could reflect developing airspace disease or atypical infection with dependent atelectatic changes posteriorly. No pneumothorax or visible effusion. No convincing CT features of edema. No worrisome pulmonary nodules or masses. Upper Abdomen: Partially calcified, auto infarcted spleen is noted. Prior cholecystectomy. No acute abnormalities present in the visualized portions of the upper abdomen. Musculoskeletal: Inferior endplate deformity T7 and T11 with a pincer type deformity of T10, could reflect prior compression fractures versus developing vertebral manifestation sickle cell disease, typically a result of microvascular endplate infarct. No other acute or worrisome osseous abnormalities. No suspicious chest wall lesions. Review of the MIP images  confirms the above findings.  IMPRESSION: 1. No evidence of pulmonary embolism. 2. Some peripheral ground-glass opacity in the anti dependent portions of the lungs could reflect developing airspace disease or atypical infection with dependent atelectatic changes posteriorly. Etiology considerations including acute chest syndrome as well as viral etiologies including COVID-19. 3. Partially calcified, auto infarcted spleen. Compatible with typical sequela of sickle cell disease. 4. Inferior endplate deformity T7 and T11 with a pincer type deformity of T10, could reflect prior compression fractures versus developing vertebral manifestations sickle cell disease, typically the result of microvascular endplate infarct. 5. Soft tissue attenuation in the anterior mediastinum, may reflect thymic remnant in a patient of this age. Electronically Signed   By: Kreg Shropshire M.D.   On: 08/27/2020 23:37    Medications: I have reviewed the patient's current medications.  Assessment/Plan: 34 year old female admitted with sickle cell crisis and possible acute chest syndrome.  #1 acute chest syndrome: Secondary to sickle cell crisis.  Patient doing better at the moment.  Has been on Rocephin and Zithromax for community-acquired pneumonia.  Acute chest syndrome treatment ongoing.  We will continue treatment and monitor.  Also Dilaudid IV fluids and other treatments.  #2 sickle cell painful crisis: Continue with treatment as above with Dilaudid PCA, Toradol and fluids.  #3 leukocytosis: Secondary to crisis and possible acute chest syndrome.  Continue treatment.  #4 mild hypotension: Aggressively hydrate.  LOS: 2 days   Tennis Mckinnon,LAWAL 08/29/2020, 10:39 PM

## 2020-08-30 DIAGNOSIS — D5700 Hb-SS disease with crisis, unspecified: Secondary | ICD-10-CM | POA: Diagnosis not present

## 2020-08-30 LAB — CBC WITH DIFFERENTIAL/PLATELET
Abs Immature Granulocytes: 0.1 10*3/uL — ABNORMAL HIGH (ref 0.00–0.07)
Basophils Absolute: 0.1 10*3/uL (ref 0.0–0.1)
Basophils Relative: 0 %
Eosinophils Absolute: 0.5 10*3/uL (ref 0.0–0.5)
Eosinophils Relative: 4 %
HCT: 16 % — ABNORMAL LOW (ref 36.0–46.0)
Hemoglobin: 5.6 g/dL — CL (ref 12.0–15.0)
Immature Granulocytes: 1 %
Lymphocytes Relative: 62 %
Lymphs Abs: 7 10*3/uL — ABNORMAL HIGH (ref 0.7–4.0)
MCH: 33.7 pg (ref 26.0–34.0)
MCHC: 35 g/dL (ref 30.0–36.0)
MCV: 96.4 fL (ref 80.0–100.0)
Monocytes Absolute: 0.8 10*3/uL (ref 0.1–1.0)
Monocytes Relative: 7 %
Neutro Abs: 2.9 10*3/uL (ref 1.7–7.7)
Neutrophils Relative %: 26 %
Platelets: 258 10*3/uL (ref 150–400)
RBC: 1.66 MIL/uL — ABNORMAL LOW (ref 3.87–5.11)
RDW: 22.6 % — ABNORMAL HIGH (ref 11.5–15.5)
WBC: 11.3 10*3/uL — ABNORMAL HIGH (ref 4.0–10.5)
nRBC: 1.5 % — ABNORMAL HIGH (ref 0.0–0.2)

## 2020-08-30 LAB — COMPREHENSIVE METABOLIC PANEL
ALT: 11 U/L (ref 0–44)
AST: 23 U/L (ref 15–41)
Albumin: 3.2 g/dL — ABNORMAL LOW (ref 3.5–5.0)
Alkaline Phosphatase: 44 U/L (ref 38–126)
Anion gap: 6 (ref 5–15)
BUN: <5 mg/dL — ABNORMAL LOW (ref 6–20)
CO2: 24 mmol/L (ref 22–32)
Calcium: 8.4 mg/dL — ABNORMAL LOW (ref 8.9–10.3)
Chloride: 105 mmol/L (ref 98–111)
Creatinine, Ser: 0.35 mg/dL — ABNORMAL LOW (ref 0.44–1.00)
GFR, Estimated: >60 mL/min (ref >60)
Glucose, Bld: 104 mg/dL — ABNORMAL HIGH (ref 70–99)
Potassium: 3.8 mmol/L (ref 3.5–5.1)
Sodium: 135 mmol/L (ref 135–145)
Total Bilirubin: 6 mg/dL — ABNORMAL HIGH (ref 0.3–1.2)
Total Protein: 6.5 g/dL (ref 6.5–8.1)

## 2020-08-30 LAB — HEMOGLOBIN AND HEMATOCRIT, BLOOD
HCT: 26.3 % — ABNORMAL LOW (ref 36.0–46.0)
Hemoglobin: 9.4 g/dL — ABNORMAL LOW (ref 12.0–15.0)

## 2020-08-30 LAB — PREPARE RBC (CROSSMATCH)

## 2020-08-30 MED ORDER — SODIUM CHLORIDE 0.9 % IV SOLN
25.0000 mg | Freq: Once | INTRAVENOUS | Status: AC
  Administered 2020-08-30: 25 mg via INTRAVENOUS
  Filled 2020-08-30: qty 25

## 2020-08-30 MED ORDER — AZITHROMYCIN 250 MG PO TABS
500.0000 mg | ORAL_TABLET | Freq: Every day | ORAL | Status: DC
  Administered 2020-08-30: 500 mg via ORAL
  Filled 2020-08-30: qty 2

## 2020-08-30 MED ORDER — ACETAMINOPHEN 325 MG PO TABS
650.0000 mg | ORAL_TABLET | Freq: Once | ORAL | Status: AC
  Administered 2020-08-30: 650 mg via ORAL
  Filled 2020-08-30: qty 2

## 2020-08-30 MED ORDER — SODIUM CHLORIDE 0.9% IV SOLUTION: Freq: Once | INTRAVENOUS | Status: AC

## 2020-08-30 NOTE — Progress Notes (Signed)
PHARMACIST - PHYSICIAN COMMUNICATION DR:   Hyman Hopes CONCERNING: Antibiotic IV to Oral Route Change Policy  RECOMMENDATION: This patient is receiving azithromycin by the intravenous route.  Based on criteria approved by the Pharmacy and Therapeutics Committee, the antibiotic(s) is/are being converted to the equivalent oral dose form(s).   DESCRIPTION: These criteria include:  Patient being treated for a respiratory tract infection, urinary tract infection, cellulitis or clostridium difficile associated diarrhea if on metronidazole  The patient is not neutropenic and does not exhibit a GI malabsorption state  The patient is eating (either orally or via tube) and/or has been taking other orally administered medications for a least 24 hours  The patient is improving clinically and has a Tmax < 100.5  If you have questions about this conversion, please contact the Pharmacy Department  []   [phone] )  ( [phone] []   [phone] )  [facility] []   [phone] )  [facility] []   [phone] )  [facility] [x]   [phone] )  [facility]   ( [phone], PharmD, BCPS 08/30/2020 8:27 AM

## 2020-08-30 NOTE — Progress Notes (Signed)
Subjective: Kara [name] is a 34 year old female with a medical history significant for sickle cell disease, chronic pain syndrome, opiate dependence and tolerance and history of anemia of chronic disease was admitted for CAP vs. Acute chest syndrome in the setting of sickle cell pain crisis.   Today, patient's hemoglobin is 5.6, which is markedly decreased from patient's baseline of 8-9 g/dL.  Patient denies any dizziness, shortness of breath, heart palpitations, urinary symptoms, nausea, vomiting, or diarrhea.  Patient says that pain has improved overnight.  Pain intensity is 6/10 primarily to mid chest and low back.  She has no new complaints on today.  Patient has been afebrile overnight.  Objective:  Vital signs in last 24 hours:  Vitals:   08/30/20 1134 08/30/20 1237 08/30/20 1256 08/30/20 1300  BP: 126/74 103/60  113/73  Pulse: 90 69  75  Resp: 16 19 15 20   Temp: 99.1 F (37.3 C) 98.8 F (37.1 C)  98 F (36.7 C)  TempSrc: Oral Oral    SpO2: 97% 96% 100% 100%  Weight:      Height:        Intake/Output from previous day:   Intake/Output Summary (Last 24 hours) at 08/30/2020 1346 Last data filed at 08/30/2020 0400 Gross per 24 hour  Intake 2343.74 ml  Output --  Net 2343.74 ml    Physical Exam: General: Alert, awake, oriented x3, in no acute distress.  HEENT: [place]/AT PEERL, EOMI Neck: Trachea midline,  no masses, no thyromegal,y no JVD, no carotid bruit OROPHARYNX:  Moist, No exudate/ erythema/lesions.  Heart: Regular rate and rhythm, without murmurs, rubs, gallops, PMI non-displaced, no heaves or thrills on palpation.  Lungs: Clear to auscultation, no wheezing or rhonchi noted. No increased vocal fremitus resonant to percussion  Abdomen: Soft, nontender, nondistended, positive bowel sounds, no masses no hepatosplenomegaly noted..  Neuro: No focal neurological deficits noted cranial nerves II through XII grossly intact. DTRs 2+ bilaterally upper and lower extremities.  Strength 5 out of 5 in bilateral upper and lower extremities. Musculoskeletal: No warm swelling or erythema around joints, no spinal tenderness noted. Psychiatric: Patient alert and oriented x3, good insight and cognition, good recent to remote recall. Lymph node survey: No cervical axillary or inguinal lymphadenopathy noted.  Lab Results:  Basic Metabolic Panel:    Component Value Date/Time   NA 135 08/30/2020 0508   NA 138 07/14/2019 1128   K 3.8 08/30/2020 0508   CL 105 08/30/2020 0508   CO2 24 08/30/2020 0508   BUN <5 (L) 08/30/2020 0508   BUN 7 07/14/2019 1128   CREATININE 0.35 (L) 08/30/2020 0508   GLUCOSE 104 (H) 08/30/2020 0508   CALCIUM 8.4 (L) 08/30/2020 0508   CBC:    Component Value Date/Time   WBC 11.3 (H) 08/30/2020 0508   HGB 5.6 (LL) 08/30/2020 0508   HGB 9.1 (L) 07/14/2019 1128   HCT 16.0 (L) 08/30/2020 0508   HCT 27.8 (L) 07/14/2019 1128   PLT 258 08/30/2020 0508   PLT 435 07/14/2019 1128   MCV 96.4 08/30/2020 0508   MCV 103 (H) 07/14/2019 1128   NEUTROABS 2.9 08/30/2020 0508   NEUTROABS 5.0 07/14/2019 1128   LYMPHSABS 7.0 (H) 08/30/2020 0508   LYMPHSABS 7.3 (H) 07/14/2019 1128   MONOABS 0.8 08/30/2020 0508   EOSABS 0.5 08/30/2020 0508   EOSABS 0.3 07/14/2019 1128   BASOSABS 0.1 08/30/2020 0508   BASOSABS 0.1 07/14/2019 1128    Recent Results (from the past 240 hour(s))  Respiratory Panel by RT PCR (Flu A&B, Covid) - Nasopharyngeal Swab     Status: None   Collection Time: 08/27/20 11:57 PM   Specimen: Nasopharyngeal Swab  Result Value Ref Range Status   SARS Coronavirus 2 by RT PCR NEGATIVE NEGATIVE Final    Comment: (NOTE) SARS-CoV-2 target nucleic acids are NOT DETECTED.  The SARS-CoV-2 RNA is generally detectable in upper respiratoy specimens during the acute phase of infection. The lowest concentration of SARS-CoV-2 viral copies this assay can detect is 131 copies/mL. A negative result does not preclude SARS-Cov-2 infection and should not  be used as the sole basis for treatment or other patient management decisions. A negative result may occur with  improper specimen collection/handling, submission of specimen other than nasopharyngeal swab, presence of viral mutation(s) within the areas targeted by this assay, and inadequate number of viral copies (<131 copies/mL). A negative result must be combined with clinical observations, patient history, and epidemiological information. The expected result is Negative.  Fact Sheet for Patients:  https://www.moore.com/  Fact Sheet for Healthcare Providers:  https://www.young.biz/  This test is no t yet approved or cleared by the [place] FDA and  has been authorized for detection and/or diagnosis of SARS-CoV-2 by FDA under an Emergency Use Authorization (EUA). This EUA will remain  in effect (meaning this test can be used) for the duration of the COVID-19 declaration under Section 564(b)(1) of the Act, 21 U.S.C. section 360bbb-3(b)(1), unless the authorization is terminated or revoked sooner.     Influenza A by PCR NEGATIVE NEGATIVE Final   Influenza B by PCR NEGATIVE NEGATIVE Final    Comment: (NOTE) The Xpert Xpress SARS-CoV-2/FLU/RSV assay is intended as an aid in  the diagnosis of influenza from Nasopharyngeal swab specimens and  should not be used as a sole basis for treatment. Nasal washings and  aspirates are unacceptable for Xpert Xpress SARS-CoV-2/FLU/RSV  testing.  Fact Sheet for Patients: https://www.moore.com/  Fact Sheet for Healthcare Providers: https://www.young.biz/  This test is not yet approved or cleared by the [place] FDA and  has been authorized for detection and/or diagnosis of SARS-CoV-2 by  FDA under an Emergency Use Authorization (EUA). This EUA will remain  in effect (meaning this test can be used) for the duration of the  Covid-19 declaration under Section  564(b)(1) of the Act, 21  U.S.C. section 360bbb-3(b)(1), unless the authorization is  terminated or revoked. Performed at [facility], [address], [place], [place] [address]     Studies/Results: No results found.  Medications: Scheduled Meds: . azithromycin  500 mg Oral QHS  . enoxaparin (LOVENOX) injection  40 mg Subcutaneous Q24H  . HYDROmorphone   Intravenous Q4H  . ketorolac  15 mg Intravenous Q6H  . senna-docusate  1 tablet Oral BID   Continuous Infusions: . cefTRIAXone (ROCEPHIN)  IV 2 g (08/29/20 2128)  . dextrose 5 % and 0.45% NaCl 125 mL/hr at 08/30/20 0800   PRN Meds:.naloxone **AND** sodium chloride flush, ondansetron (ZOFRAN) IV, polyethylene glycol  Consultants:  None  Procedures:  None  Antibiotics:  IV Rocephin  Zithromax on reactive baby aspirin only   Assessment/Plan: Principal Problem:   Sickle cell pain crisis (HCC) Active Problems:   Pulmonary hypertension (HCC)  CAP vs. acute chest syndrome: Patient has been afebrile overnight.  Continue IV Rocephin and Zithromax. Continue supportive care  Sickle cell pain crisis: Continue IV Dilaudid PCA, settings decreased on today.  IV Toradol 15 mg every 6 hours for total  of 5 days. Decrease IV fluids to [facility] Monitor vital signs closely, reevaluate pain scale regularly as needed  Leukocytosis: Improving, patient continues to be afebrile.  Continue IV antibiotics.  Repeat CBC in a.m.  Sickle cell anemia: Hemoglobin decreased to 5.6, transfused 2 units PRBCs.  Continue to follow closely.  Repeat CBC in a.m.  Chronic pain syndrome: Continue home medications  Mild hypotension: Patient aggressively hydrated.  Improved overnight.  Continue to monitor closely.   Code Status: Full Code Family Communication: N/A Disposition Plan: Not yet ready for discharge  Jalyssa Fleisher [name]  APRN, MSN, FNP-C [facility] [address] [place], [place] [address] [phone] If 5PM-8AM, please contact night-coverage.  08/30/2020, 1:46 PM  LOS: 3 days

## 2020-08-30 NOTE — Care Management Important Message (Signed)
Important Message  Patient Details IM Letter given to the Patient Name: Kara [name] MRN: [id number] Date of Birth: 1985-12-25   Medicare Important Message Given:  Yes     Caren Macadam 08/30/2020, 12:01 PM

## 2020-08-30 NOTE — Progress Notes (Addendum)
CRITICAL VALUE ALERT  Critical Value:  Hgb-5.6  Date & Time Notied:  08/30/20 0551  Provider Notified: Frederich Balding  Orders Received/Actions taken: type & screen. 2 units RBCs ordered

## 2020-08-31 DIAGNOSIS — D5700 Hb-SS disease with crisis, unspecified: Secondary | ICD-10-CM | POA: Diagnosis not present

## 2020-08-31 DIAGNOSIS — I272 Pulmonary hypertension, unspecified: Secondary | ICD-10-CM | POA: Diagnosis not present

## 2020-08-31 LAB — CBC
HCT: 25.3 % — ABNORMAL LOW (ref 36.0–46.0)
Hemoglobin: 8.8 g/dL — ABNORMAL LOW (ref 12.0–15.0)
MCH: 31.9 pg (ref 26.0–34.0)
MCHC: 34.8 g/dL (ref 30.0–36.0)
MCV: 91.7 fL (ref 80.0–100.0)
Platelets: 316 10*3/uL (ref 150–400)
RBC: 2.76 MIL/uL — ABNORMAL LOW (ref 3.87–5.11)
RDW: 22 % — ABNORMAL HIGH (ref 11.5–15.5)
WBC: 11.6 10*3/uL — ABNORMAL HIGH (ref 4.0–10.5)
nRBC: 1.8 % — ABNORMAL HIGH (ref 0.0–0.2)

## 2020-08-31 LAB — TYPE AND SCREEN
ABO/RH(D): A POS
Antibody Screen: NEGATIVE
Donor AG Type: NEGATIVE
Donor AG Type: NEGATIVE
Unit division: 0
Unit division: 0

## 2020-08-31 LAB — BPAM RBC
Blood Product Expiration Date: 202111012359
Blood Product Expiration Date: 202111022359
ISSUE DATE / TIME: 202110111242
ISSUE DATE / TIME: 202110111616
Unit Type and Rh: 5100
Unit Type and Rh: 5100

## 2020-08-31 MED ORDER — HYDROMORPHONE 1 MG/ML IV SOLN
INTRAVENOUS | Status: DC
  Administered 2020-08-31: 1.06 mg via INTRAVENOUS
  Administered 2020-09-01: 6.36 mg via INTRAVENOUS
  Administered 2020-09-01: 3.18 mg via INTRAVENOUS
  Administered 2020-09-01: 30 mg via INTRAVENOUS
  Administered 2020-09-02: 0 mg via INTRAVENOUS
  Administered 2020-09-02: 2.12 mg via INTRAVENOUS
  Filled 2020-08-31: qty 30

## 2020-08-31 MED ORDER — SALINE SPRAY 0.65 % NA SOLN
1.0000 | NASAL | Status: DC | PRN
  Filled 2020-08-31: qty 44

## 2020-08-31 MED ORDER — OXYCODONE HCL 5 MG PO TABS
15.0000 mg | ORAL_TABLET | ORAL | Status: DC | PRN
  Administered 2020-09-01 – 2020-09-03 (×8): 15 mg via ORAL
  Filled 2020-08-31 (×9): qty 3

## 2020-08-31 MED ORDER — KETOROLAC TROMETHAMINE 15 MG/ML IJ SOLN
15.0000 mg | Freq: Four times a day (QID) | INTRAMUSCULAR | Status: DC
  Administered 2020-08-31 – 2020-09-02 (×9): 15 mg via INTRAVENOUS
  Filled 2020-08-31 (×10): qty 1

## 2020-08-31 NOTE — Progress Notes (Signed)
Subjective: Kara [name] is a 34 year old female with a medical history significant for sickle cell disease, opiate dependence and tolerance, history of anemia of chronic disease, history of generalized anxiety was admitted for CAP versus acute syndrome in the setting of sickle cell pain crisis.  Patient is complaining of increased pain primarily to low back and lower extremities.  She says that pain has worsened overnight.  Pain intensity is 7/10. Today, patient's hemoglobin has improved to 8.8.  She is S/P 2 units PRBCs on 08/30/2020. She denies any headache, shortness of breath, chest pain, urinary symptoms, nausea, vomiting, or diarrhea.  Objective:  Vital signs in last 24 hours:  Vitals:   08/31/20 0200 08/31/20 0346 08/31/20 0507 08/31/20 0820  BP: 110/70  112/70   Pulse: 74  77   Resp: 17 11 20 14   Temp: 98.2 F (36.8 C)  98.1 F (36.7 C)   TempSrc: Oral  Oral   SpO2: 99% 100% 93% 96%  Weight:      Height:        Intake/Output from previous day:   Intake/Output Summary (Last 24 hours) at 08/31/2020 0904 Last data filed at 08/30/2020 2300 Gross per 24 hour  Intake 2950.75 ml  Output 400 ml  Net 2550.75 ml    Physical Exam: General: Alert, awake, oriented x3, in no acute distress.  HEENT: [place]/AT PEERL, EOMI Neck: Trachea midline,  no masses, no thyromegal,y no JVD, no carotid bruit OROPHARYNX:  Moist, No exudate/ erythema/lesions.  Heart: Regular rate and rhythm, without murmurs, rubs, gallops, PMI non-displaced, no heaves or thrills on palpation.  Lungs: Clear to auscultation, no wheezing or rhonchi noted. No increased vocal fremitus resonant to percussion  Abdomen: Soft, nontender, nondistended, positive bowel sounds, no masses no hepatosplenomegaly noted..  Neuro: No focal neurological deficits noted cranial nerves II through XII grossly intact. DTRs 2+ bilaterally upper and lower extremities. Strength 5 out of 5 in bilateral upper and lower  extremities. Musculoskeletal: No warm swelling or erythema around joints, no spinal tenderness noted. Psychiatric: Patient alert and oriented x3, good insight and cognition, good recent to remote recall. Lymph node survey: No cervical axillary or inguinal lymphadenopathy noted.  Lab Results:  Basic Metabolic Panel:    Component Value Date/Time   NA 135 08/30/2020 0508   NA 138 07/14/2019 1128   K 3.8 08/30/2020 0508   CL 105 08/30/2020 0508   CO2 24 08/30/2020 0508   BUN <5 (L) 08/30/2020 0508   BUN 7 07/14/2019 1128   CREATININE 0.35 (L) 08/30/2020 0508   GLUCOSE 104 (H) 08/30/2020 0508   CALCIUM 8.4 (L) 08/30/2020 0508   CBC:    Component Value Date/Time   WBC 11.6 (H) 08/31/2020 0739   HGB 8.8 (L) 08/31/2020 0739   HGB 9.1 (L) 07/14/2019 1128   HCT 25.3 (L) 08/31/2020 0739   HCT 27.8 (L) 07/14/2019 1128   PLT 316 08/31/2020 0739   PLT 435 07/14/2019 1128   MCV 91.7 08/31/2020 0739   MCV 103 (H) 07/14/2019 1128   NEUTROABS 2.9 08/30/2020 0508   NEUTROABS 5.0 07/14/2019 1128   LYMPHSABS 7.0 (H) 08/30/2020 0508   LYMPHSABS 7.3 (H) 07/14/2019 1128   MONOABS 0.8 08/30/2020 0508   EOSABS 0.5 08/30/2020 0508   EOSABS 0.3 07/14/2019 1128   BASOSABS 0.1 08/30/2020 0508   BASOSABS 0.1 07/14/2019 1128    Recent Results (from the past 240 hour(s))  Respiratory Panel by RT PCR (Flu A&B, Covid) - Nasopharyngeal Swab  Status: None   Collection Time: 08/27/20 11:57 PM   Specimen: Nasopharyngeal Swab  Result Value Ref Range Status   SARS Coronavirus 2 by RT PCR NEGATIVE NEGATIVE Final    Comment: (NOTE) SARS-CoV-2 target nucleic acids are NOT DETECTED.  The SARS-CoV-2 RNA is generally detectable in upper respiratoy specimens during the acute phase of infection. The lowest concentration of SARS-CoV-2 viral copies this assay can detect is 131 copies/mL. A negative result does not preclude SARS-Cov-2 infection and should not be used as the sole basis for treatment or other  patient management decisions. A negative result may occur with  improper specimen collection/handling, submission of specimen other than nasopharyngeal swab, presence of viral mutation(s) within the areas targeted by this assay, and inadequate number of viral copies (<131 copies/mL). A negative result must be combined with clinical observations, patient history, and epidemiological information. The expected result is Negative.  Fact Sheet for Patients:  https://www.moore.com/  Fact Sheet for Healthcare Providers:  https://www.young.biz/  This test is no t yet approved or cleared by the [place] FDA and  has been authorized for detection and/or diagnosis of SARS-CoV-2 by FDA under an Emergency Use Authorization (EUA). This EUA will remain  in effect (meaning this test can be used) for the duration of the COVID-19 declaration under Section 564(b)(1) of the Act, 21 U.S.C. section 360bbb-3(b)(1), unless the authorization is terminated or revoked sooner.     Influenza A by PCR NEGATIVE NEGATIVE Final   Influenza B by PCR NEGATIVE NEGATIVE Final    Comment: (NOTE) The Xpert Xpress SARS-CoV-2/FLU/RSV assay is intended as an aid in  the diagnosis of influenza from Nasopharyngeal swab specimens and  should not be used as a sole basis for treatment. Nasal washings and  aspirates are unacceptable for Xpert Xpress SARS-CoV-2/FLU/RSV  testing.  Fact Sheet for Patients: https://www.moore.com/  Fact Sheet for Healthcare Providers: https://www.young.biz/  This test is not yet approved or cleared by the [place] FDA and  has been authorized for detection and/or diagnosis of SARS-CoV-2 by  FDA under an Emergency Use Authorization (EUA). This EUA will remain  in effect (meaning this test can be used) for the duration of the  Covid-19 declaration under Section 564(b)(1) of the Act, 21  U.S.C. section  360bbb-3(b)(1), unless the authorization is  terminated or revoked. Performed at [facility], [address] [place], [place] [address]     Studies/Results: No results found.  Medications: Scheduled Meds: . enoxaparin (LOVENOX) injection  40 mg Subcutaneous Q24H  . HYDROmorphone   Intravenous Q4H  . ketorolac  15 mg Intravenous Q6H  . senna-docusate  1 tablet Oral BID   Continuous Infusions: PRN Meds:.naloxone **AND** sodium chloride flush, ondansetron (ZOFRAN) IV, polyethylene glycol, sodium chloride  Consultants:  None  Procedures:  None  Antibiotics:  None  Assessment/Plan: Principal Problem:   Sickle cell pain crisis (HCC) Active Problems:   Pulmonary hypertension (HCC)  CAP vs. Acute chest syndrome:  Patient continues to be afebrile.  Discontinue IV Rocephin and Zithromax.  Continue supportive care.  Wean oxygen as tolerated.  Sickle cell pain crisis: Continue IV Dilaudid PCA IV Toradol 15 mg every 6 hours for total of 5 days. IV fluids to [facility] Oxycodone 15 mg every 4 hours as needed for severe breakthrough pain  Leukocytosis: Improving.  Stable.  Patient continues to be afebrile.  IV antibiotics discontinued.  Follow CBC in a.m.  Sickle cell anemia: Today, hemoglobin is 8.8, consistent with patient's baseline.  Patient is s/p 2 units PRBC.  Continue to follow closely.  Repeat CBC in a.m.  Chronic pain syndrome: Continue home medications    Code Status: Full Code Family Communication: N/A Disposition Plan: Not yet ready for discharge  Desyre Calma [name]  APRN, MSN, FNP-C [facility] [address] [place], [place] [address] [phone]  If 5PM-7AM, please contact night-coverage.  08/31/2020, 9:04 AM  LOS: 4 days

## 2020-08-31 NOTE — Progress Notes (Signed)
Assessment unchanged, agree with current plan of care. Pain controlled with PCA, Toradol and oral meds. Will continue to monitor. SRP, RN

## 2020-09-01 ENCOUNTER — Inpatient Hospital Stay (HOSPITAL_COMMUNITY): Payer: Medicare Other

## 2020-09-01 DIAGNOSIS — L538 Other specified erythematous conditions: Secondary | ICD-10-CM | POA: Diagnosis not present

## 2020-09-01 DIAGNOSIS — M7989 Other specified soft tissue disorders: Secondary | ICD-10-CM

## 2020-09-01 DIAGNOSIS — M79605 Pain in left leg: Secondary | ICD-10-CM

## 2020-09-01 DIAGNOSIS — R609 Edema, unspecified: Secondary | ICD-10-CM

## 2020-09-01 DIAGNOSIS — D5700 Hb-SS disease with crisis, unspecified: Secondary | ICD-10-CM | POA: Diagnosis not present

## 2020-09-01 DIAGNOSIS — I272 Pulmonary hypertension, unspecified: Secondary | ICD-10-CM | POA: Diagnosis not present

## 2020-09-01 DIAGNOSIS — G894 Chronic pain syndrome: Secondary | ICD-10-CM

## 2020-09-01 NOTE — Progress Notes (Signed)
Pr c/o pain,swelling and bruise to left thigh. Pt denies injury. MD made aware. No new orders at this time.

## 2020-09-01 NOTE — Progress Notes (Addendum)
Subjective: Kara [name] is a 34 year old female with history of anemia of chronic disease, history of generalized anxiety was admitted for CAP vs acute chest syndrome in the setting of sickle cell pain crisis.  Patient continues to complain of pain primarily to low back and lower extremities.  Patient has a new complaint of swelling, pain, and warmth to left thigh.  She denies any recent injury.  She denies any headache, chest pain, shortness of breath, urinary symptoms, nausea, vomiting, or diarrhea.  Oxygen saturation is 100% on RA.  Objective:  Vital signs in last 24 hours:  Vitals:   09/01/20 0522 09/01/20 0533 09/01/20 0800 09/01/20 0900  BP:  131/81  125/73  Pulse:  63  64  Resp: 20 16 18 18   Temp:  98.6 F (37 C)  98.5 F (36.9 C)  TempSrc:  Oral  Oral  SpO2: 100% 100% 100% 100%  Weight:      Height:        Intake/Output from previous day:   Intake/Output Summary (Last 24 hours) at 09/01/2020 1137 Last data filed at 09/01/2020 0600 Gross per 24 hour  Intake 0 ml  Output --  Net 0 ml    Physical Exam: General: Alert, awake, oriented x3, in no acute distress.  HEENT: [place]/AT PEERL, EOMI Neck: Trachea midline,  no masses, no thyromegal,y no JVD, no carotid bruit OROPHARYNX:  Moist, No exudate/ erythema/lesions.  Heart: Regular rate and rhythm, without murmurs, rubs, gallops, PMI non-displaced, no heaves or thrills on palpation.  Lungs: Clear to auscultation, no wheezing or rhonchi noted. No increased vocal fremitus resonant to percussion  Abdomen: Soft, nontender, nondistended, positive bowel sounds, no masses no hepatosplenomegaly noted..  Neuro: No focal neurological deficits noted cranial nerves II through XII grossly intact. DTRs 2+ bilaterally upper and lower extremities. Strength 5 out of 5 in bilateral upper and lower extremities. Musculoskeletal: No warm swelling or erythema around joints, no spinal tenderness noted. Psychiatric: Patient alert and oriented x3,  good insight and cognition, good recent to remote recall. Lymph node survey: No cervical axillary or inguinal lymphadenopathy noted.  Lab Results:  Basic Metabolic Panel:    Component Value Date/Time   NA 135 08/30/2020 0508   NA 138 07/14/2019 1128   K 3.8 08/30/2020 0508   CL 105 08/30/2020 0508   CO2 24 08/30/2020 0508   BUN <5 (L) 08/30/2020 0508   BUN 7 07/14/2019 1128   CREATININE 0.35 (L) 08/30/2020 0508   GLUCOSE 104 (H) 08/30/2020 0508   CALCIUM 8.4 (L) 08/30/2020 0508   CBC:    Component Value Date/Time   WBC 11.6 (H) 08/31/2020 0739   HGB 8.8 (L) 08/31/2020 0739   HGB 9.1 (L) 07/14/2019 1128   HCT 25.3 (L) 08/31/2020 0739   HCT 27.8 (L) 07/14/2019 1128   PLT 316 08/31/2020 0739   PLT 435 07/14/2019 1128   MCV 91.7 08/31/2020 0739   MCV 103 (H) 07/14/2019 1128   NEUTROABS 2.9 08/30/2020 0508   NEUTROABS 5.0 07/14/2019 1128   LYMPHSABS 7.0 (H) 08/30/2020 0508   LYMPHSABS 7.3 (H) 07/14/2019 1128   MONOABS 0.8 08/30/2020 0508   EOSABS 0.5 08/30/2020 0508   EOSABS 0.3 07/14/2019 1128   BASOSABS 0.1 08/30/2020 0508   BASOSABS 0.1 07/14/2019 1128    Recent Results (from the past 240 hour(s))  Respiratory Panel by RT PCR (Flu A&B, Covid) - Nasopharyngeal Swab     Status: None   Collection Time: 08/27/20 11:57 PM  Specimen: Nasopharyngeal Swab  Result Value Ref Range Status   SARS Coronavirus 2 by RT PCR NEGATIVE NEGATIVE Final    Comment: (NOTE) SARS-CoV-2 target nucleic acids are NOT DETECTED.  The SARS-CoV-2 RNA is generally detectable in upper respiratoy specimens during the acute phase of infection. The lowest concentration of SARS-CoV-2 viral copies this assay can detect is 131 copies/mL. A negative result does not preclude SARS-Cov-2 infection and should not be used as the sole basis for treatment or other patient management decisions. A negative result may occur with  improper specimen collection/handling, submission of specimen other than  nasopharyngeal swab, presence of viral mutation(s) within the areas targeted by this assay, and inadequate number of viral copies (<131 copies/mL). A negative result must be combined with clinical observations, patient history, and epidemiological information. The expected result is Negative.  Fact Sheet for Patients:  https://www.moore.com/  Fact Sheet for Healthcare Providers:  https://www.young.biz/  This test is no t yet approved or cleared by the [place] FDA and  has been authorized for detection and/or diagnosis of SARS-CoV-2 by FDA under an Emergency Use Authorization (EUA). This EUA will remain  in effect (meaning this test can be used) for the duration of the COVID-19 declaration under Section 564(b)(1) of the Act, 21 U.S.C. section 360bbb-3(b)(1), unless the authorization is terminated or revoked sooner.     Influenza A by PCR NEGATIVE NEGATIVE Final   Influenza B by PCR NEGATIVE NEGATIVE Final    Comment: (NOTE) The Xpert Xpress SARS-CoV-2/FLU/RSV assay is intended as an aid in  the diagnosis of influenza from Nasopharyngeal swab specimens and  should not be used as a sole basis for treatment. Nasal washings and  aspirates are unacceptable for Xpert Xpress SARS-CoV-2/FLU/RSV  testing.  Fact Sheet for Patients: https://www.moore.com/  Fact Sheet for Healthcare Providers: https://www.young.biz/  This test is not yet approved or cleared by the [place] FDA and  has been authorized for detection and/or diagnosis of SARS-CoV-2 by  FDA under an Emergency Use Authorization (EUA). This EUA will remain  in effect (meaning this test can be used) for the duration of the  Covid-19 declaration under Section 564(b)(1) of the Act, 21  U.S.C. section 360bbb-3(b)(1), unless the authorization is  terminated or revoked. Performed at [facility], [address], [place], [place] [address]     Studies/Results: No results found.  Medications: Scheduled Meds: . HYDROmorphone   Intravenous Q4H  . ketorolac  15 mg Intravenous Q6H  . senna-docusate  1 tablet Oral BID   Continuous Infusions: PRN Meds:.naloxone **AND** sodium chloride flush, ondansetron (ZOFRAN) IV, oxyCODONE, polyethylene glycol, sodium chloride  Consultants:  None  Procedures:  None  Antibiotics:  None   Assessment/Plan: Principal Problem:   Sickle cell pain crisis (HCC) Active Problems:   Leukocytosis   Pulmonary hypertension (HCC)   Chronic pain syndrome   Sickle cell disease vs. Acute chest syndrome:  Patient continues to be afebrile.  Oxygen saturation 100% on RA.  IV antibiotics were discontinued on 08/31/2020.  Continue supportive care.  Ambulating pulse ox.  Sickle cell pain crisis: Weaning IV Dilaudid PCA. Continue IV Toradol 15 mg every 6 hours for total of 5 days Continue IV fluids at [facility] Oxycodone 15 mg every 4 hours as needed for severe breakthrough pain Encourage patient to mobilize today.  Leukocytosis: Improving.  Stable.  Patient continues to be afebrile.  Continue to follow CBC.  Sickle cell anemia: Hemoglobin is stable and consistent with patient's baseline.  Patient is s/p 2 units PRBCs.  Continue to follow closely.  Repeat CBC in a.m.  Chronic pain syndrome: Continue home medications  Left lower extremity swelling and pain: Left posterior thigh warm and tender to palpation.  Will review venous Dopplers as results become available.   Code Status: Full Code Family Communication: N/A Disposition Plan: Not yet ready for discharge    Gerry Blanchfield [name]  APRN, MSN, FNP-C [facility] [address] [place], [place] [address] [phone]  If 5PM-8AM, please contact night-coverage.  09/01/2020, 11:37 AM  LOS: 5 days

## 2020-09-01 NOTE — Progress Notes (Signed)
Lower extremity venous LT study completed.   Please see CV Proc for preliminary results.   Letizia Hook, RDMS  

## 2020-09-02 DIAGNOSIS — D5700 Hb-SS disease with crisis, unspecified: Secondary | ICD-10-CM | POA: Diagnosis not present

## 2020-09-02 LAB — BASIC METABOLIC PANEL
Anion gap: 8 (ref 5–15)
BUN: 7 mg/dL (ref 6–20)
CO2: 24 mmol/L (ref 22–32)
Calcium: 8.6 mg/dL — ABNORMAL LOW (ref 8.9–10.3)
Chloride: 107 mmol/L (ref 98–111)
Creatinine, Ser: 0.53 mg/dL (ref 0.44–1.00)
GFR, Estimated: >60 mL/min (ref >60)
Glucose, Bld: 96 mg/dL (ref 70–99)
Potassium: 3.7 mmol/L (ref 3.5–5.1)
Sodium: 139 mmol/L (ref 135–145)

## 2020-09-02 LAB — CBC
HCT: 26.4 % — ABNORMAL LOW (ref 36.0–46.0)
Hemoglobin: 9.4 g/dL — ABNORMAL LOW (ref 12.0–15.0)
MCH: 31.8 pg (ref 26.0–34.0)
MCHC: 35.6 g/dL (ref 30.0–36.0)
MCV: 89.2 fL (ref 80.0–100.0)
Platelets: 346 10*3/uL (ref 150–400)
RBC: 2.96 MIL/uL — ABNORMAL LOW (ref 3.87–5.11)
RDW: 20.7 % — ABNORMAL HIGH (ref 11.5–15.5)
WBC: 9.9 10*3/uL (ref 4.0–10.5)
nRBC: 0.8 % — ABNORMAL HIGH (ref 0.0–0.2)

## 2020-09-02 MED ORDER — OXYCODONE HCL ER 10 MG PO T12A
10.0000 mg | EXTENDED_RELEASE_TABLET | Freq: Two times a day (BID) | ORAL | Status: DC
  Administered 2020-09-02 – 2020-09-03 (×3): 10 mg via ORAL
  Filled 2020-09-02 (×3): qty 1

## 2020-09-02 MED ORDER — HYDROMORPHONE HCL 1 MG/ML IJ SOLN
1.0000 mg | Freq: Four times a day (QID) | INTRAMUSCULAR | Status: DC | PRN
  Administered 2020-09-02 – 2020-09-03 (×2): 1 mg via INTRAVENOUS
  Filled 2020-09-02 (×3): qty 1

## 2020-09-02 NOTE — Care Management Important Message (Signed)
Important Message  Patient Details IM Letter given to the Patient Name: Happy Begeman MRN: [id number] Date of Birth: 1986/05/22   Medicare Important Message Given:  Yes     Caren Macadam 09/02/2020, 10:54 AM

## 2020-09-02 NOTE — Plan of Care (Signed)
°  Problem: Clinical Measurements: Goal: Will remain free from infection Outcome: Progressing Goal: Diagnostic test results will improve Outcome: Progressing   Problem: Activity: Goal: Risk for activity intolerance will decrease Outcome: Progressing   Problem: Elimination: Goal: Will not experience complications related to bowel motility Outcome: Progressing   Problem: Pain Managment: Goal: General experience of comfort will improve Outcome: Progressing

## 2020-09-02 NOTE — Progress Notes (Signed)
Subjective: Kara [name] is a 34 year old female with a medical history significant for sickle cell disease, chronic pain syndrome, opiate dependence and tolerance, generalized anxiety, and history of anemia of chronic disease was admitted for CAP versus acute chest syndrome in the setting of sickle cell pain crisis.  Patient continues to complain of pain primarily to low back and lower extremities.  Lower extremity Dopplers done yesterday were negative for any deep vein thrombosis.  She denies any headache, chest pain, shortness of breath, urinary symptoms, nausea, vomiting, or diarrhea.  Objective:  Vital signs in last 24 hours:  Vitals:   09/02/20 0356 09/02/20 0800 09/02/20 1002 09/02/20 1201  BP:   118/70   Pulse:   72   Resp: 16 16 18 19   Temp:   98.7 F (37.1 C)   TempSrc:   Oral   SpO2: 100% 100% 100% 96%  Weight:      Height:        Intake/Output from previous day:   Intake/Output Summary (Last 24 hours) at 09/02/2020 1308 Last data filed at 09/01/2020 1600 Gross per 24 hour  Intake 30 ml  Output --  Net 30 ml    Physical Exam: General: Alert, awake, oriented x3, in no acute distress.  HEENT: [place]/AT PEER[place], EOMI Neck: Trachea midline,  no masses, no thyromegal,y no JVD, no carotid bruit OROPHARYNX:  Moist, No exudate/ erythema/lesions.  Heart: Regular rate and rhythm, without murmurs, rubs, gallops, PMI non-displaced, no heaves or thrills on palpation.  Lungs: Clear to auscultation, no wheezing or rhonchi noted. No increased vocal fremitus resonant to percussion  Abdomen: Soft, nontender, nondistended, positive bowel sounds, no masses no hepatosplenomegaly noted..  Neuro: No focal neurological deficits noted cranial nerves II through XII grossly intact. DTRs 2+ bilaterally upper and lower extremities. Strength 5 out of 5 in bilateral upper and lower extremities. Musculoskeletal: No warm swelling or erythema around joints, no spinal tenderness noted. Psychiatric:  Patient alert and oriented x3, good insight and cognition, good recent to remote recall. Lymph node survey: No cervical axillary or inguinal lymphadenopathy noted.  Lab Results:  Basic Metabolic Panel:    Component Value Date/Time   NA 139 09/02/2020 0643   NA 138 07/14/2019 1128   K 3.7 09/02/2020 0643   CL 107 09/02/2020 0643   CO2 24 09/02/2020 0643   BUN 7 09/02/2020 0643   BUN 7 07/14/2019 1128   CREATININE 0.53 09/02/2020 0643   GLUCOSE 96 09/02/2020 0643   CALCIUM 8.6 (L) 09/02/2020 0643   CBC:    Component Value Date/Time   WBC 9.9 09/02/2020 0643   HGB 9.4 (L) 09/02/2020 0643   HGB 9.1 (L) 07/14/2019 1128   HCT 26.4 (L) 09/02/2020 0643   HCT 27.8 (L) 07/14/2019 1128   PLT 346 09/02/2020 0643   PLT 435 07/14/2019 1128   MCV 89.2 09/02/2020 0643   MCV 103 (H) 07/14/2019 1128   NEUTROABS 2.9 08/30/2020 0508   NEUTROABS 5.0 07/14/2019 1128   LYMPHSABS 7.0 (H) 08/30/2020 0508   LYMPHSABS 7.3 (H) 07/14/2019 1128   MONOABS 0.8 08/30/2020 0508   EOSABS 0.5 08/30/2020 0508   EOSABS 0.3 07/14/2019 1128   BASOSABS 0.1 08/30/2020 0508   BASOSABS 0.1 07/14/2019 1128    Recent Results (from the past 240 hour(s))  Respiratory Panel by RT PCR (Flu A&B, Covid) - Nasopharyngeal Swab     Status: None   Collection Time: 08/27/20 11:57 PM   Specimen: Nasopharyngeal Swab  Result Value Ref Range  Status   SARS Coronavirus 2 by RT PCR NEGATIVE NEGATIVE Final    Comment: (NOTE) SARS-CoV-2 target nucleic acids are NOT DETECTED.  The SARS-CoV-2 RNA is generally detectable in upper respiratoy specimens during the acute phase of infection. The lowest concentration of SARS-CoV-2 viral copies this assay can detect is 131 copies/mL. A negative result does not preclude SARS-Cov-2 infection and should not be used as the sole basis for treatment or other patient management decisions. A negative result may occur with  improper specimen collection/handling, submission of specimen  other than nasopharyngeal swab, presence of viral mutation(s) within the areas targeted by this assay, and inadequate number of viral copies (<131 copies/mL). A negative result must be combined with clinical observations, patient history, and epidemiological information. The expected result is Negative.  Fact Sheet for Patients:  https://www.moore.com/  Fact Sheet for Healthcare Providers:  https://www.young.biz/  This test is no t yet approved or cleared by the [place] FDA and  has been authorized for detection and/or diagnosis of SARS-CoV-2 by FDA under an Emergency Use Authorization (EUA). This EUA will remain  in effect (meaning this test can be used) for the duration of the COVID-19 declaration under Section 564(b)(1) of the Act, 21 U.S.C. section 360bbb-3(b)(1), unless the authorization is terminated or revoked sooner.     Influenza A by PCR NEGATIVE NEGATIVE Final   Influenza B by PCR NEGATIVE NEGATIVE Final    Comment: (NOTE) The Xpert Xpress SARS-CoV-2/FLU/RSV assay is intended as an aid in  the diagnosis of influenza from Nasopharyngeal swab specimens and  should not be used as a sole basis for treatment. Nasal washings and  aspirates are unacceptable for Xpert Xpress SARS-CoV-2/FLU/RSV  testing.  Fact Sheet for Patients: https://www.moore.com/  Fact Sheet for Healthcare Providers: https://www.young.biz/  This test is not yet approved or cleared by the [place] FDA and  has been authorized for detection and/or diagnosis of SARS-CoV-2 by  FDA under an Emergency Use Authorization (EUA). This EUA will remain  in effect (meaning this test can be used) for the duration of the  Covid-19 declaration under Section 564(b)(1) of the Act, 21  U.S.C. section 360bbb-3(b)(1), unless the authorization is  terminated or revoked. Performed at [facility], [address] [place], [place] [address]     Studies/Results: VAS [place] LOWER EXTREMITY VENOUS (DVT)  Result Date: 09/01/2020  Lower Venous DVTStudy Indications: Erythema, edema, warmth to left thigh, bruise.  Comparison Study: No prior studies. Performing Technologist: Jean Rosenthal  Examination Guidelines: A complete evaluation includes B-mode imaging, spectral Doppler, color Doppler, and power Doppler as needed of all accessible portions of each vessel. Bilateral testing is considered an integral part of a complete examination. Limited examinations for reoccurring indications may be performed as noted. The reflux portion of the exam is performed with the patient in reverse Trendelenburg.  +-----+---------------+---------+-----------+----------+--------------+ RIGHTCompressibilityPhasicitySpontaneityPropertiesThrombus Aging +-----+---------------+---------+-----------+----------+--------------+ CFV  Full           Yes      Yes                                 +-----+---------------+---------+-----------+----------+--------------+   +---------+---------------+---------+-----------+----------+--------------+ LEFT     CompressibilityPhasicitySpontaneityPropertiesThrombus Aging +---------+---------------+---------+-----------+----------+--------------+ CFV      Full           Yes      Yes                                 +---------+---------------+---------+-----------+----------+--------------+  SFJ      Full                                                        +---------+---------------+---------+-----------+----------+--------------+ FV Prox  Full                                                        +---------+---------------+---------+-----------+----------+--------------+ FV Mid   Full                                                        +---------+---------------+---------+-----------+----------+--------------+ FV DistalFull                                                         +---------+---------------+---------+-----------+----------+--------------+ PFV      Full                                                        +---------+---------------+---------+-----------+----------+--------------+ POP      Full           Yes      Yes                                 +---------+---------------+---------+-----------+----------+--------------+ PTV      Full                                                        +---------+---------------+---------+-----------+----------+--------------+ PERO     Full                                                        +---------+---------------+---------+-----------+----------+--------------+     Summary: RIGHT: - No evidence of common femoral vein obstruction.  LEFT: - There is no evidence of deep vein thrombosis in the lower extremity.  - No cystic structure found in the popliteal fossa.  *See table(s) above for measurements and observations.    Preliminary     Medications: Scheduled Meds: . ketorolac  15 mg Intravenous Q6H  . oxyCODONE  10 mg Oral Q12H  . senna-docusate  1 tablet Oral BID   Continuous Infusions: PRN Meds:.HYDROmorphone (DILAUDID) injection, oxyCODONE, polyethylene glycol, sodium chloride  Consultants:  None  Procedures:  None  Antibiotics:  None  Assessment/Plan: Principal Problem:  Sickle cell pain crisis (HCC) Active Problems:   Leukocytosis   Pulmonary hypertension (HCC)   Chronic pain syndrome   Pain and swelling of left lower extremity  CAP vs. Acute chest syndrome:  Patient continues to be afebrile.  Oxygen saturation 100% on RA.  Patient monitored closely without antibiotics.  Continue supportive care.  Oxygen saturation maintained above 90% with ambulation.  Sickle cell pain crisis: Discontinue IV Dilaudid PCA.  OxyContin 10 mg every 12 hours for greater pain control. Oxycodone 15 mg every 4 hours as needed for severe breakthrough pain Dilaudid 1 mg  IV every 6 hours as needed [facility] encouraged to [facility] today.  Leukocytosis: Stable.  Patient continues to be afebrile.  Continue to follow CBC closely.  Sickle cell anemia: Hemoglobin stable and consistent with patient's baseline.  Patient is s/p 2 units PRBCs. Repeat CBC in a.m.  Chronic pain syndrome: Continue home medications    Code Status: Full Code Family Communication: N/A Disposition Plan: Not yet ready for discharge.  Possible discharge in a.m.  Nolon NationsLachina Moore Vishaal Strollo  APRN, MSN, FNP-C Patient Care [facility] [address]  , [place] [address] [phone]  If 5PM-8AM, please contact night-coverage.  09/02/2020, 1:08 PM  LOS: 6 days

## 2020-09-02 NOTE — Care Management Important Message (Signed)
Important Message  Patient Details  IM Letter given to the Patient Name: Kara [name] MRN: [id number] Date of Birth: 11/16/1986   Medicare Important Message Given:  Yes     Caren Macadam 09/02/2020, 10:53 AM

## 2020-09-02 NOTE — Plan of Care (Signed)
  Problem: Clinical Measurements: Goal: Will remain free from infection Outcome: Progressing Goal: Diagnostic test results will improve Outcome: Progressing   Problem: Nutrition: Goal: Adequate nutrition will be maintained Outcome: Progressing   Problem: Pain Managment: Goal: General experience of comfort will improve Outcome: Progressing   

## 2020-09-03 DIAGNOSIS — D571 Sickle-cell disease without crisis: Secondary | ICD-10-CM | POA: Diagnosis not present

## 2020-09-03 DIAGNOSIS — D5700 Hb-SS disease with crisis, unspecified: Secondary | ICD-10-CM | POA: Diagnosis not present

## 2020-09-03 DIAGNOSIS — G894 Chronic pain syndrome: Secondary | ICD-10-CM | POA: Diagnosis not present

## 2020-09-03 MED ORDER — OXYCODONE HCL 15 MG PO TABS: 15.0000 mg | ORAL_TABLET | ORAL | 0 refills | Status: DC | PRN

## 2020-09-03 NOTE — Progress Notes (Signed)
Pt called back stating that her medication was not at the pharmacy the AVS indicated. Hollis NP called and she stated that she would call and try to get Rx taken care of. Hollis NP did state that patient maybe in "lock out" and may have to go through PCP for Rx. Patient called back and provided update and next steps if the Rx is considered "lock out". Pt verbalized understanding.

## 2020-09-03 NOTE — Discharge Summary (Signed)
Physician Discharge Summary  Kara [name] TIW:[id number] DOB: 06/16/1986 DOA: 08/27/2020  PCP: Kara Locks, FNP  Admit date: 08/27/2020  Discharge date: 09/03/2020  Discharge Diagnoses:  Principal Problem:   Sickle cell pain crisis (HCC) Active Problems:   Leukocytosis   Pulmonary hypertension (HCC)   Chronic pain syndrome   Pain and swelling of left lower extremity   Discharge Condition: Stable  Disposition:   Follow-up Information    Kara Locks, FNP Follow up.   Specialty: Family Medicine Contact information: [address] [place] [address] [phone]              Pt is discharged home in good condition and is to follow up with Kara Locks, FNP in 1 week to have labs evaluated. Kara [name] is instructed to increase activity slowly and balance with rest for the next few days, and use prescribed medication to complete treatment of pain  Diet: Regular Wt Readings from Last 3 Encounters:  08/28/20 54.4 kg  08/27/20 55.3 kg  07/29/20 54.6 kg    History of present illness:  Kara [name] is a 34 year old female with a medical history significant for sickle cell disease, chronic pain syndrome, opiate dependence and tolerance, and history of anemia of chronic disease presented to the ER with complaints of pain over the past 2 days.  Pain is particularly of the chest radiating to back, joints of both upper and lower extremities, which has been persistent despite taking her home medications.  Patient denies any nausea, vomiting, diarrhea, headache, or any other focal deficits.  ER course: In the ER, patient had a temperature of 99.5 F and was mildly hypoxic.  CT angiogram of the chest with showing features concerning for pneumonia versus acute chest syndrome.  EKG showed sinus tachycardia.  COVID-19 test was negative.  Lab work was significant for leukocytosis of 29,000 and hemoglobin of 8.1.  Patient was started on empiric  antibiotics and pain relief medications.  Admitted for sickle cell pain crisis with possible acute chest syndrome  Hospital Course:  Sickle cell disease with pain crisis:  Patient was admitted for sickle cell pain crisis and managed appropriately with IVF, IV Dilaudid via PCA and IV Toradol, as well as other adjunct therapies per sickle cell pain management protocols.  IV Dilaudid PCA weaned appropriately.  Patient transition to home medications including OxyContin 10 mg every 12 hours and oxycodone 15 mg every 4 hours as needed.  Patient is due for oxycodone prescription, oxycodone 15 mg #90 was sent to patient's pharmacy.  Prior to prescribing opiate medications, PDMP was reviewed.  No inconsistencies noted.  Pain intensity is 5/10.  Patient is requesting discharge home.  Community-acquired pneumonia vs. acute chest syndrome: On admission, CT angiogram showed some peripheral groundglass opacity and the antidependent portions of the lungs that could have possibly reflect a developing airspace disease or atypical infection with dependent atelectatic changes posteriorly.  This etiology could be considered to be acute chest syndrome.  Patient was treated empirically with IV antibiotics.  She has been afebrile.  Leukocytosis resolved.  Also, weaned to room air.  Oxygen saturation currently 100% on room air.  Patient is ambulating without any assistance.  No further antibiotic therapy needed  Sickle cell anemia: Hemoglobin decreased to 5.6, below patient's baseline of 7-8 g/dL.  Patient was transfused 2 units PRBCs without complication.  Hemoglobin is currently 9.4.  Patient advised to follow-up with PCP to repeat CBC with differential in 1 week.  Also, advised to follow-up for medication management.  Patient was therefore discharged home today in a hemodynamically stable condition.   Shabnam will follow-up with PCP within 1 week of this discharge. Tamari was counseled extensively about nonpharmacologic  means of pain management, patient verbalized understanding and was appreciative of  the care received during this admission.   We discussed the need for good hydration, monitoring of hydration status, avoidance of heat, cold, stress, and infection triggers.  Discussed the importance of taking folic acid consistently.  Patient is not on any other disease modifying agent.  May warrant hematology referral.  Patient was reminded of the need to seek medical attention immediately if any symptom of bleeding, anemia, or infection occurs.  Discharge Exam: Vitals:   09/03/20 0231 09/03/20 0545  BP: 121/64 124/77  Pulse: (!) 57 60  Resp: 18 20  Temp: 98.7 F (37.1 C) 98.6 F (37 C)  SpO2: 95% 97%   Vitals:   09/02/20 1732 09/02/20 2224 09/03/20 0231 09/03/20 0545  BP: 118/73 132/68 121/64 124/77  Pulse: 76 (!) 58 (!) 57 60  Resp: 16 18 18 20   Temp:  98.3 F (36.8 C) 98.7 F (37.1 C) 98.6 F (37 C)  TempSrc:  Oral Oral Oral  SpO2: 92% 96% 95% 97%  Weight:      Height:        General appearance : Awake, alert, not in any distress. Speech Clear. Not toxic looking HEENT: Atraumatic and Normocephalic, pupils equally reactive to light and accomodation Neck: Supple, no JVD. No cervical lymphadenopathy.  Chest: Good air entry bilaterally, no added sounds  CVS: S1 S2 regular, no murmurs.  Abdomen: Bowel sounds present, Non tender and not distended with no gaurding, rigidity or rebound. Extremities: B/L Lower Ext shows no edema, both legs are warm to touch Neurology: Awake alert, and oriented X 3, CN II-XII intact, Non focal Skin: No Rash  Discharge Instructions  Discharge Instructions    Discharge patient   Complete by: As directed    Discharge disposition: 01-Home or Self Care   Discharge patient date: 09/03/2020     Allergies as of 09/03/2020      Reactions   Kiwi Extract Hives   Morphine And Related Hives   Paroxetine Hives      Medication List    TAKE these medications    Benzocaine 20 % Oint Apply 1 application topically 4 (four) times daily as needed.   butalbital-acetaminophen-caffeine 50-325-40 MG tablet Commonly known as: FIORICET Take 1 tablet by mouth every 6 (six) hours as needed for headache.   cetirizine 10 MG tablet Commonly known as: ZYRTEC Take 1 tablet (10 mg total) by mouth daily. What changed:   when to take this  reasons to take this   Ensure Plus Liqd Take 237 mLs by mouth 3 (three) times daily between meals. What changed:   when to take this  reasons to take this   folic acid 1 MG tablet Commonly known as: FOLVITE Take 1 tablet (1 mg total) by mouth daily.   hydrOXYzine 10 MG tablet Commonly known as: ATARAX/VISTARIL Take 10 mg by mouth in the morning and at bedtime.   methocarbamol 500 MG tablet Commonly known as: ROBAXIN Take 1 tablet (500 mg total) by mouth in the morning and at bedtime. What changed: when to take this   naproxen 500 MG tablet Commonly known as: NAPROSYN Take 1 tablet (500 mg total) by mouth 2 (two) times daily with a meal. What changed:  when to take this   omeprazole 40 MG capsule Commonly known as: PRILOSEC Take 1 capsule (40 mg total) by mouth daily.   oxyCODONE 10 mg 12 hr tablet Commonly known as: OxyCONTIN Take 1 tablet (10 mg total) by mouth every 8 (eight) hours as needed. What changed:   when to take this  reasons to take this   oxyCODONE 15 MG immediate release tablet Commonly known as: ROXICODONE Take 1 tablet (15 mg total) by mouth every 4 (four) hours as needed for up to 15 days. What changed: reasons to take this   Vitamin D (Ergocalciferol) 1.25 MG (50000 UNIT) Caps capsule Commonly known as: DRISDOL Take 1 capsule (50,000 Units total) by mouth every 7 (seven) days. What changed: additional instructions       The results of significant diagnostics from this hospitalization (including imaging, microbiology, ancillary and laboratory) are listed below for  reference.    Significant Diagnostic Studies: DG Chest 2 View  Result Date: 08/27/2020 CLINICAL DATA:  Pain, sickle cell crisis EXAM: CHEST - 2 VIEW COMPARISON:  06/15/2020 FINDINGS: Enlargement of cardiac silhouette with pulmonary vascular congestion. Hazy infiltrates in the mid to upper lungs question pneumonia or edema. No pleural effusion or pneumothorax. Osseous structures unremarkable. IMPRESSION: Hazy infiltrates in mid to upper lungs question edema or pneumonia. Electronically Signed   By: Ulyses Southward M.D.   On: 08/27/2020 17:05   CT Angio Chest PE W/Cm &/Or Wo Cm  Result Date: 08/27/2020 CLINICAL DATA:  PE suspected, positive D-dimer, sickle cell crisis EXAM: CT ANGIOGRAPHY CHEST WITH CONTRAST TECHNIQUE: Multidetector CT imaging of the chest was performed using the standard protocol during bolus administration of intravenous contrast. Multiplanar CT image reconstructions and MIPs were obtained to evaluate the vascular anatomy. CONTRAST:  OMNIPAQUE IOHEXOL 350 MG/ML SOLN COMPARISON:  Chest radiograph 08/27/2020 FINDINGS: Cardiovascular: Satisfactory opacification the pulmonary arteries to the segmental level. No pulmonary artery filling defects are identified. Central pulmonary arteries are normal caliber. Normal heart size. No pericardial effusion. The aortic root is suboptimally assessed given cardiac pulsation artifact. The aorta is normal caliber. No acute luminal abnormality of the imaged aorta. No periaortic stranding or hemorrhage. Shared origin of the brachiocephalic and left common carotid arteries. Proximal great vessels are otherwise unremarkable. No major venous abnormalities. Mediastinum/Nodes: Soft tissue attenuation in the anterior mediastinum, may reflect thymic remnant in a patient of this age. No mediastinal fluid or gas. Normal thyroid gland and thoracic inlet. No acute abnormality of the trachea or esophagus. No worrisome mediastinal, hilar or axillary adenopathy.  Lungs/Pleura: Some peripheral ground-glass opacity seen in the anti dependent portions of the lungs could reflect developing airspace disease or atypical infection with dependent atelectatic changes posteriorly. No pneumothorax or visible effusion. No convincing CT features of edema. No worrisome pulmonary nodules or masses. Upper Abdomen: Partially calcified, auto infarcted spleen is noted. Prior cholecystectomy. No acute abnormalities present in the visualized portions of the upper abdomen. Musculoskeletal: Inferior endplate deformity T7 and T11 with a pincer type deformity of T10, could reflect prior compression fractures versus developing vertebral manifestation sickle cell disease, typically a result of microvascular endplate infarct. No other acute or worrisome osseous abnormalities. No suspicious chest wall lesions. Review of the MIP images confirms the above findings. IMPRESSION: 1. No evidence of pulmonary embolism. 2. Some peripheral ground-glass opacity in the anti dependent portions of the lungs could reflect developing airspace disease or atypical infection with dependent atelectatic changes posteriorly. Etiology considerations including acute chest syndrome as well  as viral etiologies including COVID-19. 3. Partially calcified, auto infarcted spleen. Compatible with typical sequela of sickle cell disease. 4. Inferior endplate deformity T7 and T11 with a pincer type deformity of T10, could reflect prior compression fractures versus developing vertebral manifestations sickle cell disease, typically the result of microvascular endplate infarct. 5. Soft tissue attenuation in the anterior mediastinum, may reflect thymic remnant in a patient of this age. Electronically Signed   By: Kreg Shropshire M.D.   On: 08/27/2020 23:37   VAS [place] LOWER EXTREMITY VENOUS (DVT)  Result Date: 09/02/2020  Lower Venous DVTStudy Indications: Erythema, edema, warmth to left thigh, bruise.  Comparison Study: No prior studies.  Performing Technologist: Jean Rosenthal  Examination Guidelines: A complete evaluation includes B-mode imaging, spectral Doppler, color Doppler, and power Doppler as needed of all accessible portions of each vessel. Bilateral testing is considered an integral part of a complete examination. Limited examinations for reoccurring indications may be performed as noted. The reflux portion of the exam is performed with the patient in reverse Trendelenburg.  +-----+---------------+---------+-----------+----------+--------------+  RIGHT Compressibility Phasicity Spontaneity Properties Thrombus Aging  +-----+---------------+---------+-----------+----------+--------------+  CFV   Full            Yes       Yes                                    +-----+---------------+---------+-----------+----------+--------------+   +---------+---------------+---------+-----------+----------+--------------+  LEFT      Compressibility Phasicity Spontaneity Properties Thrombus Aging  +---------+---------------+---------+-----------+----------+--------------+  CFV       Full            Yes       Yes                                    +---------+---------------+---------+-----------+----------+--------------+  SFJ       Full                                                             +---------+---------------+---------+-----------+----------+--------------+  FV Prox   Full                                                             +---------+---------------+---------+-----------+----------+--------------+  FV Mid    Full                                                             +---------+---------------+---------+-----------+----------+--------------+  FV Distal Full                                                             +---------+---------------+---------+-----------+----------+--------------+  PFV       Full                                                              +---------+---------------+---------+-----------+----------+--------------+  POP       Full            Yes       Yes                                    +---------+---------------+---------+-----------+----------+--------------+  PTV       Full                                                             +---------+---------------+---------+-----------+----------+--------------+  PERO      Full                                                             +---------+---------------+---------+-----------+----------+--------------+     Summary: RIGHT: - No evidence of common femoral vein obstruction.  LEFT: - There is no evidence of deep vein thrombosis in the lower extremity.  - No cystic structure found in the popliteal fossa.  *See table(s) above for measurements and observations. Electronically signed by Lemar Livings MD on 09/02/2020 at 2:11:43 PM.    Final     Microbiology: Recent Results (from the past 240 hour(s))  Respiratory Panel by RT PCR (Flu A&B, Covid) - Nasopharyngeal Swab     Status: None   Collection Time: 08/27/20 11:57 PM   Specimen: Nasopharyngeal Swab  Result Value Ref Range Status   SARS Coronavirus 2 by RT PCR NEGATIVE NEGATIVE Final    Comment: (NOTE) SARS-CoV-2 target nucleic acids are NOT DETECTED.  The SARS-CoV-2 RNA is generally detectable in upper respiratoy specimens during the acute phase of infection. The lowest concentration of SARS-CoV-2 viral copies this assay can detect is 131 copies/mL. A negative result does not preclude SARS-Cov-2 infection and should not be used as the sole basis for treatment or other patient management decisions. A negative result may occur with  improper specimen collection/handling, submission of specimen other than nasopharyngeal swab, presence of viral mutation(s) within the areas targeted by this assay, and inadequate number of viral copies (<131 copies/mL). A negative result must be combined with clinical observations, patient history,  and epidemiological information. The expected result is Negative.  Fact Sheet for Patients:  https://www.moore.com/  Fact Sheet for Healthcare Providers:  https://www.young.biz/  This test is no t yet approved or cleared by the [place] FDA and  has been authorized for detection and/or diagnosis of SARS-CoV-2 by FDA under an Emergency Use Authorization (EUA). This EUA will remain  in effect (meaning this test can be used) for the duration of the COVID-19 declaration under Section 564(b)(1) of the Act, 21 U.S.C. section 360bbb-3(b)(1), unless the authorization is  terminated or revoked sooner.     Influenza A by PCR NEGATIVE NEGATIVE Final   Influenza B by PCR NEGATIVE NEGATIVE Final    Comment: (NOTE) The Xpert Xpress SARS-CoV-2/FLU/RSV assay is intended as an aid in  the diagnosis of influenza from Nasopharyngeal swab specimens and  should not be used as a sole basis for treatment. Nasal washings and  aspirates are unacceptable for Xpert Xpress SARS-CoV-2/FLU/RSV  testing.  Fact Sheet for Patients: https://www.moore.com/https://www.fda.gov/media/142436/download  Fact Sheet for Healthcare Providers: https://www.young.biz/https://www.fda.gov/media/142435/download  This test is not yet approved or cleared by the [place] FDA and  has been authorized for detection and/or diagnosis of SARS-CoV-2 by  FDA under an Emergency Use Authorization (EUA). This EUA will remain  in effect (meaning this test can be used) for the duration of the  Covid-19 declaration under Section 564(b)(1) of the Act, 21  U.S.C. section 360bbb-3(b)(1), unless the authorization is  terminated or revoked. Performed at [facility], [address] [place], [place] [address]      Labs: Basic Metabolic Panel: Recent Labs  Lab 08/27/20 1605 08/27/20 1605 08/30/20 0508 09/02/20 0643  NA 133*  --  135 139  K 4.6   < > 3.8 3.7  CL 101  --  105 107  CO2 24  --  24 24  GLUCOSE 96   --  104* 96  BUN 6  --  <5* 7  CREATININE <0.30*  --  0.35* 0.53  CALCIUM 8.4*  --  8.4* 8.6*   < > = values in this interval not displayed.   Liver Function Tests: Recent Labs  Lab 08/27/20 1605 08/30/20 0508  AST 86* 23  ALT 21 11  ALKPHOS 57 44  BILITOT 9.4* 6.0*  PROT 7.7 6.5  ALBUMIN 4.2 3.2*   No results for input(s): LIPASE, AMYLASE in the last 168 hours. No results for input(s): AMMONIA in the last 168 hours. CBC: Recent Labs  Lab 08/27/20 1605 08/30/20 0508 08/30/20 2118 08/31/20 0739 09/02/20 0643  WBC 29.0* 11.3*  --  11.6* 9.9  NEUTROABS 16.8* 2.9  --   --   --   HGB 8.1* 5.6* 9.4* 8.8* 9.4*  HCT 24.0* 16.0* 26.3* 25.3* 26.4*  MCV 101.3* 96.4  --  91.7 89.2  PLT 288 258  --  316 346   Cardiac Enzymes: No results for input(s): CKTOTAL, CKMB, CKMBINDEX, TROPONINI in the last 168 hours. BNP: Invalid input(s): POCBNP CBG: No results for input(s): GLUCAP in the last 168 hours.  Time coordinating discharge: 35 minutes  Signed:  Nolon NationsLachina Moore Ameah Chanda  APRN, MSN, FNP-C [facility] [address]  [place], [place] [address] [phone]   [facility] 09/03/2020, 9:37 AM

## 2020-09-15 ENCOUNTER — Telehealth: Payer: Self-pay | Admitting: Family Medicine

## 2020-09-16 ENCOUNTER — Other Ambulatory Visit: Payer: Self-pay | Admitting: Nurse Practitioner

## 2020-09-16 DIAGNOSIS — G8929 Other chronic pain: Secondary | ICD-10-CM

## 2020-09-16 DIAGNOSIS — F1190 Opioid use, unspecified, uncomplicated: Secondary | ICD-10-CM

## 2020-09-16 DIAGNOSIS — D571 Sickle-cell disease without crisis: Secondary | ICD-10-CM

## 2020-09-16 MED ORDER — OXYCODONE HCL 15 MG PO TABS: 15.0000 mg | ORAL_TABLET | ORAL | 0 refills | Status: DC | PRN

## 2020-09-16 NOTE — Telephone Encounter (Signed)
Oxycodone sent NP Bradly Chris I will allow you to send in the Oxycontin Thanks

## 2020-09-17 ENCOUNTER — Encounter: Payer: Self-pay | Admitting: Family Medicine

## 2020-09-21 ENCOUNTER — Other Ambulatory Visit: Payer: Self-pay | Admitting: Family Medicine

## 2020-09-21 ENCOUNTER — Telehealth: Payer: Self-pay | Admitting: Family Medicine

## 2020-09-21 DIAGNOSIS — G8929 Other chronic pain: Secondary | ICD-10-CM

## 2020-09-21 DIAGNOSIS — D571 Sickle-cell disease without crisis: Secondary | ICD-10-CM

## 2020-09-21 DIAGNOSIS — F1190 Opioid use, unspecified, uncomplicated: Secondary | ICD-10-CM

## 2020-09-21 MED ORDER — OXYCODONE HCL ER 10 MG PO T12A: 10.0000 mg | EXTENDED_RELEASE_TABLET | Freq: Three times a day (TID) | ORAL | 0 refills | Status: DC | PRN

## 2020-09-23 NOTE — Telephone Encounter (Signed)
Sent to provider 

## 2020-09-24 ENCOUNTER — Ambulatory Visit: Payer: Medicare Other | Admitting: Family Medicine

## 2020-09-29 ENCOUNTER — Telehealth: Payer: Self-pay | Admitting: Family Medicine

## 2020-09-29 ENCOUNTER — Other Ambulatory Visit: Payer: Self-pay | Admitting: Family Medicine

## 2020-09-29 DIAGNOSIS — G8929 Other chronic pain: Secondary | ICD-10-CM

## 2020-09-29 DIAGNOSIS — F1190 Opioid use, unspecified, uncomplicated: Secondary | ICD-10-CM

## 2020-09-29 DIAGNOSIS — D571 Sickle-cell disease without crisis: Secondary | ICD-10-CM

## 2020-09-29 MED ORDER — OXYCODONE HCL 15 MG PO TABS: 15.0000 mg | ORAL_TABLET | ORAL | 0 refills | Status: DC | PRN

## 2020-09-30 NOTE — Telephone Encounter (Signed)
Sent to provider 

## 2020-10-04 ENCOUNTER — Other Ambulatory Visit: Payer: Self-pay

## 2020-10-04 ENCOUNTER — Ambulatory Visit (INDEPENDENT_AMBULATORY_CARE_PROVIDER_SITE_OTHER): Payer: Medicare Other | Admitting: Family Medicine

## 2020-10-04 ENCOUNTER — Encounter: Payer: Self-pay | Admitting: Family Medicine

## 2020-10-04 VITALS — BP 110/62 | HR 79 | Temp 97.7°F | Resp 16 | Ht 63.0 in | Wt 120.0 lb

## 2020-10-04 DIAGNOSIS — Z09 Encounter for follow-up examination after completed treatment for conditions other than malignant neoplasm: Secondary | ICD-10-CM | POA: Diagnosis not present

## 2020-10-04 DIAGNOSIS — D5700 Hb-SS disease with crisis, unspecified: Secondary | ICD-10-CM

## 2020-10-04 DIAGNOSIS — G8929 Other chronic pain: Secondary | ICD-10-CM

## 2020-10-04 DIAGNOSIS — F1190 Opioid use, unspecified, uncomplicated: Secondary | ICD-10-CM | POA: Diagnosis not present

## 2020-10-04 DIAGNOSIS — D571 Sickle-cell disease without crisis: Secondary | ICD-10-CM

## 2020-10-04 DIAGNOSIS — F419 Anxiety disorder, unspecified: Secondary | ICD-10-CM

## 2020-10-04 NOTE — Progress Notes (Signed)
[facility]    Established Patient Office Visit  Subjective:  Patient ID: Kara MullingSherry Elyse [name], female    DOB: August 11, 1986  Age: 34 y.o. MRN: [id number]  CC:  Chief Complaint  Patient presents with  . Follow-up  . Leg Pain    x3days  . Headache    x2days    HPI Kara MullingSherry Elyse Lusty is a 34 year old female presents for Hospital Follow Up today.    Patient Active Problem List   Diagnosis Date Noted  . Pain and swelling of left lower extremity 09/01/2020  . Abscess of right buttock 04/17/2020  . 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 Sickle cell anemia with pain (HCC) 12/08/2019  . Decreased appetite 08/28/2019  . Chronic pain of right knee 07/15/2019  . Gastroesophageal reflux disease without esophagitis 07/15/2019  . Anxiety 07/15/2019  . Death of family member 07/15/2019  . Chronic, continuous use of opioids 07/15/2019  . Headache, new daily persistent ([facility]) 07/04/2019  . 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 Chronic pain syndrome 12/27/2018  . Anemia of chronic disease 12/27/2018  . Pulmonary hypertension (HCC) 04/04/2018  . Sickle cell disease (HCC) 04/04/2018  . Arthritis 04/04/2018  . Chest pain on breathing   . Leukocytosis 02/06/2018  . Sickle cell crisis (HCC) 02/05/2018  . Sickle cell pain crisis (HCC) 10/29/2017   Current Status: Since her last office visit, she is doing well with no complaints. She states that she has pain in her legs. She rates her pain today at 5/10. She has not had a hospital visit for Sickle Cell Crisis since 08/27/2020 where she was treated and discharged the same day. She is currently taking all medications as prescribed and staying well hydrated. She reports occasional nausea, constipation, dizziness and headaches. She denies fevers, chills, recent infections, weight loss, and night sweats. She has not had any visual changes, and falls. No chest pain, heart palpitations, cough and  shortness of breath reported. Denies GI problems such as diarrhea. She has no reports of blood in stools, dysuria and hematuria. No depression or anxiety reported today. She is taking all medications as prescribed.   Past Medical History:  Diagnosis Date  . Arthritis   . Chronic migraine   . GERD (gastroesophageal reflux disease)   . Hx of cholecystectomy 2015  . Pulmonary hypertension (HCC)   . Sickle cell anemia (HCC)   . Tendinitis    left elbow    Past Surgical History:  Procedure Laterality Date  . DILATION AND CURETTAGE OF UTERUS    . GALLBLADDER SURGERY      Family History  Adopted: Yes    Social History   Socioeconomic History  . Marital status: Single    Spouse name: Not on file  . Number of children: Not on file  . Years of education: Not on file  . Highest education level: Not on file  Occupational History  . Not on file  Tobacco Use  . Smoking status: Current Every Day Smoker  . Smokeless tobacco: Never Used  Vaping Use  . Vaping Use: Never used  Substance and Sexual Activity  . Alcohol use: No  . Drug use: Yes    Types: Marijuana  . Sexual activity: Yes    Birth control/protection: None  Other Topics Concern  . Not on file  Social History Narrative  . Not on file   Social Determinants of Health   Financial Resource Strain:   .  Difficulty of Paying Living Expenses: Not on file  Food Insecurity:   . Worried About Programme researcher, broadcasting/film/video in the Last Year: Not on file  . Ran Out of Food in the Last Year: Not on file  Transportation Needs:   . Lack of Transportation (Medical): Not on file  . Lack of Transportation (Non-Medical): Not on file  Physical Activity:   . Days of Exercise per Week: Not on file  . Minutes of Exercise per Session: Not on file  Stress:   . Feeling of Stress : Not on file  Social Connections:   . Frequency of Communication with Friends and Family: Not on file  . Frequency of Social Gatherings with Friends and Family: Not on  file  . Attends Religious Services: Not on file  . Active Member of Clubs or Organizations: Not on file  . Attends Banker Meetings: Not on file  . Marital Status: Not on file  Intimate Partner Violence:   . Fear of Current or Ex-Partner: Not on file  . Emotionally Abused: Not on file  . Physically Abused: Not on file  . Sexually Abused: Not on file    Outpatient Medications Prior to Visit  Medication Sig Dispense Refill  . Benzocaine 20 % OINT Apply 1 application topically 4 (four) times daily as needed. 28 g 2  . butalbital-acetaminophen-caffeine (FIORICET) 50-325-40 MG tablet Take 1 tablet by mouth every 6 (six) hours as needed for headache. 14 tablet 0  . cetirizine (ZYRTEC) 10 MG tablet Take 1 tablet (10 mg total) by mouth daily. (Patient taking differently: Take 10 mg by mouth daily as needed for allergies. ) 30 tablet 11  . Ensure Plus (ENSURE PLUS) LIQD Take 237 mLs by mouth 3 (three) times daily between meals. (Patient taking differently: Take 237 mLs by mouth daily as needed (meal replacement). ) 237 mL 6  . folic acid (FOLVITE) 1 MG tablet Take 1 tablet (1 mg total) by mouth daily. 30 tablet 11  . hydrOXYzine (ATARAX/VISTARIL) 10 MG tablet Take 10 mg by mouth in the morning and at bedtime.    . methocarbamol (ROBAXIN) 500 MG tablet Take 1 tablet (500 mg total) by mouth in the morning and at bedtime. (Patient taking differently: Take 500 mg by mouth in the morning, at noon, in the evening, and at bedtime. ) 60 tablet 6  . naproxen (NAPROSYN) 500 MG tablet Take 1 tablet (500 mg total) by mouth 2 (two) times daily with a meal. (Patient taking differently: Take 500 mg by mouth in the morning and at bedtime. ) 60 tablet 3  . omeprazole (PRILOSEC) 40 MG capsule Take 1 capsule (40 mg total) by mouth daily. 30 capsule 11  . oxyCODONE (OXYCONTIN) 10 mg 12 hr tablet Take 1 tablet (10 mg total) by mouth every 8 (eight) hours as needed. 90 tablet 0  . oxyCODONE (ROXICODONE) 15 MG  immediate release tablet Take 1 tablet (15 mg total) by mouth every 4 (four) hours as needed for up to 15 days. 90 tablet 0  . Vitamin D, Ergocalciferol, (DRISDOL) 1.25 MG (50000 UNIT) CAPS capsule Take 1 capsule (50,000 Units total) by mouth every 7 (seven) days. (Patient taking differently: Take 50,000 Units by mouth every 7 (seven) days. Mondays) 5 capsule 2   No facility-administered medications prior to visit.    Allergies  Allergen Reactions  . Kiwi Extract Hives  . Morphine And Related Hives  . Paroxetine Hives    ROS Review of  Systems  Constitutional: Negative.   HENT: Negative.   Eyes: Negative.   Respiratory: Negative.   Cardiovascular: Negative.   Gastrointestinal: Positive for constipation (occasional ) and nausea (occasional ).  Endocrine: Negative.   Genitourinary: Negative.   Musculoskeletal: Positive for arthralgias (occasional ).  Skin: Negative.   Allergic/Immunologic: Negative.   Neurological: Positive for dizziness (occasional ) and headaches (occasional ).  Hematological: Negative.   Psychiatric/Behavioral: Negative.      Objective:    Physical Exam Vitals and nursing note reviewed.  Constitutional:      Appearance: She is well-developed.  HENT:     Head: Normocephalic and atraumatic.     Mouth/Throat:     Mouth: Mucous membranes are moist.     Pharynx: Oropharynx is clear.  Cardiovascular:     Rate and Rhythm: Normal rate and regular rhythm.     Pulses: Normal pulses.     Heart sounds: Normal heart sounds.  Pulmonary:     Effort: Pulmonary effort is normal.     Breath sounds: Normal breath sounds.  Abdominal:     General: Bowel sounds are normal.     Palpations: Abdomen is soft.  Musculoskeletal:        General: Normal range of motion.     Cervical back: Normal range of motion and neck supple.  Skin:    General: Skin is warm and dry.  Neurological:     General: No focal deficit present.     Mental Status: She is alert and oriented to  person, place, and time.  Psychiatric:        Mood and Affect: Mood normal.        Behavior: Behavior normal.        Thought Content: Thought content normal.        Judgment: Judgment normal.     BP 110/62 (BP Location: Left Arm, Patient Position: Sitting, Cuff Size: Normal)   Pulse 79   Temp 97.7 F (36.5 C)   Resp 16   Ht 5\' 3"  (1.6 m)   Wt 120 lb (54.4 kg)   LMP 09/27/2020   SpO2 100%   BMI 21.26 kg/m  Wt Readings from Last 3 Encounters:  10/04/20 120 lb (54.4 kg)  08/28/20 119 lb 14.4 oz (54.4 kg)  08/27/20 122 lb (55.3 kg)     Health Maintenance Due  Topic Date Due  . Hepatitis C Screening  Never done  . COVID-19 Vaccine (1) Never done  . TETANUS/TDAP  Never done  . PAP SMEAR-Modifier  Never done  . INFLUENZA VACCINE  06/20/2020    There are no preventive care reminders to display for this patient.  No results found for: TSH Lab Results  Component Value Date   WBC 9.9 09/02/2020   HGB 9.4 (L) 09/02/2020   HCT 26.4 (L) 09/02/2020   MCV 89.2 09/02/2020   PLT 346 09/02/2020   Lab Results  Component Value Date   NA 139 09/02/2020   K 3.7 09/02/2020   CO2 24 09/02/2020   GLUCOSE 96 09/02/2020   BUN 7 09/02/2020   CREATININE 0.53 09/02/2020   BILITOT 6.0 (H) 08/30/2020   ALKPHOS 44 08/30/2020   AST 23 08/30/2020   ALT 11 08/30/2020   PROT 6.5 08/30/2020   ALBUMIN 3.2 (L) 08/30/2020   CALCIUM 8.6 (L) 09/02/2020   ANIONGAP 8 09/02/2020   No results found for: CHOL No results found for: HDL No results found for: LDLCALC No results found for: TRIG No  results found for: CHOLHDL No results found for: ZOXW9U  Assessment & Plan:   1. Hospital discharge follow-up  2. Sickle cell crisis [facility]) She is doing well today r/t her chronic pain management. She will continue to take pain medications as prescribed; will continue to avoid extreme heat and cold; will continue to eat a healthy diet and drink at least 64 ounces of water daily; continue stool  softener as needed; will avoid colds and flu; will continue to get plenty of sleep and rest; will continue to avoid high stressful situations and remain infection free; will continue Folic Acid 1 mg daily to avoid sickle cell crisis. Continue to follow up with Hematologist as needed.   3. Hb-SS disease without crisis (HCC) - Urinalysis Dipstick  4. Chronic, continuous use of opioids  5. Other chronic pain  6. Anxiety We will send her new Psych referral.   7. Follow up She will follow up in 2 months.  She will be traveling to [place] on Monday or Tuesday for the Holidays.    No orders of the defined types were placed in this encounter.   Orders Placed This Encounter  Procedures  . Urinalysis Dipstick    Referral Orders  No referral(s) requested today    Raliegh Ip,  MSN, FNP-BC [facility] [address] [place], [place] [address] [phone]- fax   Problem List Items Addressed This Visit      Other   Anxiety   Chronic, continuous use of opioids   Hb-SS disease without crisis [facility])   Relevant Orders   Urinalysis Dipstick   Sickle cell crisis [facility])    Other Visit Diagnoses    Hospital discharge follow-up    -  Primary   Other chronic pain       Follow up          No orders of the defined types were placed in this encounter.   Follow-up: No follow-ups on file.    [name], FNP

## 2020-10-06 ENCOUNTER — Encounter: Payer: Self-pay | Admitting: Family Medicine

## 2020-10-06 ENCOUNTER — Telehealth: Payer: Self-pay | Admitting: Family Medicine

## 2020-10-08 NOTE — Telephone Encounter (Signed)
Sent to provider 

## 2020-10-10 ENCOUNTER — Encounter: Payer: Self-pay | Admitting: Family Medicine

## 2020-10-12 ENCOUNTER — Other Ambulatory Visit: Payer: Self-pay | Admitting: Family Medicine

## 2020-10-12 DIAGNOSIS — G8929 Other chronic pain: Secondary | ICD-10-CM

## 2020-10-12 DIAGNOSIS — F1190 Opioid use, unspecified, uncomplicated: Secondary | ICD-10-CM

## 2020-10-12 DIAGNOSIS — D571 Sickle-cell disease without crisis: Secondary | ICD-10-CM

## 2020-10-12 DIAGNOSIS — F419 Anxiety disorder, unspecified: Secondary | ICD-10-CM

## 2020-10-12 MED ORDER — OXYCODONE HCL ER 10 MG PO T12A: 10.0000 mg | EXTENDED_RELEASE_TABLET | Freq: Three times a day (TID) | ORAL | 0 refills | Status: DC | PRN

## 2020-10-12 MED ORDER — OXYCODONE HCL 15 MG PO TABS: 15.0000 mg | ORAL_TABLET | ORAL | 0 refills | Status: DC | PRN

## 2020-10-27 ENCOUNTER — Other Ambulatory Visit: Payer: Self-pay | Admitting: Family Medicine

## 2020-10-27 ENCOUNTER — Telehealth: Payer: Self-pay | Admitting: Family Medicine

## 2020-10-27 DIAGNOSIS — G8929 Other chronic pain: Secondary | ICD-10-CM

## 2020-10-27 DIAGNOSIS — F1190 Opioid use, unspecified, uncomplicated: Secondary | ICD-10-CM

## 2020-10-27 DIAGNOSIS — D571 Sickle-cell disease without crisis: Secondary | ICD-10-CM

## 2020-10-27 MED ORDER — OXYCODONE HCL 15 MG PO TABS: 15.0000 mg | ORAL_TABLET | ORAL | 0 refills | Status: DC | PRN

## 2020-10-27 NOTE — Telephone Encounter (Signed)
Done

## 2020-10-29 ENCOUNTER — Telehealth: Payer: Self-pay | Admitting: Family Medicine

## 2020-10-29 ENCOUNTER — Other Ambulatory Visit: Payer: Self-pay | Admitting: Family Medicine

## 2020-10-29 DIAGNOSIS — D571 Sickle-cell disease without crisis: Secondary | ICD-10-CM

## 2020-10-29 DIAGNOSIS — G8929 Other chronic pain: Secondary | ICD-10-CM

## 2020-10-29 DIAGNOSIS — F1190 Opioid use, unspecified, uncomplicated: Secondary | ICD-10-CM

## 2020-10-29 MED ORDER — OXYCODONE HCL ER 10 MG PO T12A: 10.0000 mg | EXTENDED_RELEASE_TABLET | Freq: Three times a day (TID) | ORAL | 0 refills | Status: DC | PRN

## 2020-10-29 NOTE — Telephone Encounter (Signed)
Done

## 2020-11-09 ENCOUNTER — Other Ambulatory Visit: Payer: Self-pay | Admitting: Family Medicine

## 2020-11-10 ENCOUNTER — Telehealth: Payer: Self-pay | Admitting: Family Medicine

## 2020-11-11 NOTE — Telephone Encounter (Signed)
Rx for Oxy-IR previously sent to pharmacy. We will re-assess continual need for Vitamin D supplement at her next office visit.

## 2020-11-16 ENCOUNTER — Other Ambulatory Visit: Payer: Self-pay | Admitting: Family Medicine

## 2020-11-16 ENCOUNTER — Encounter: Payer: Self-pay | Admitting: Family Medicine

## 2020-11-16 DIAGNOSIS — F1190 Opioid use, unspecified, uncomplicated: Secondary | ICD-10-CM

## 2020-11-16 DIAGNOSIS — G8929 Other chronic pain: Secondary | ICD-10-CM

## 2020-11-16 DIAGNOSIS — D571 Sickle-cell disease without crisis: Secondary | ICD-10-CM

## 2020-11-16 MED ORDER — OXYCODONE HCL 15 MG PO TABS: 15.0000 mg | ORAL_TABLET | ORAL | 0 refills | Status: DC | PRN

## 2020-11-26 ENCOUNTER — Telehealth: Payer: Self-pay | Admitting: Family Medicine

## 2020-11-27 ENCOUNTER — Other Ambulatory Visit: Payer: Self-pay | Admitting: Family Medicine

## 2020-11-27 DIAGNOSIS — G8929 Other chronic pain: Secondary | ICD-10-CM

## 2020-11-27 DIAGNOSIS — D571 Sickle-cell disease without crisis: Secondary | ICD-10-CM

## 2020-11-27 DIAGNOSIS — F1190 Opioid use, unspecified, uncomplicated: Secondary | ICD-10-CM

## 2020-11-27 MED ORDER — OXYCODONE HCL 15 MG PO TABS: 15.0000 mg | ORAL_TABLET | ORAL | 0 refills | Status: DC | PRN

## 2020-11-27 MED ORDER — NAPROXEN 500 MG PO TABS: 500.0000 mg | ORAL_TABLET | Freq: Two times a day (BID) | ORAL | 3 refills | Status: DC

## 2020-11-27 NOTE — Telephone Encounter (Signed)
Done

## 2020-11-29 ENCOUNTER — Encounter: Payer: Self-pay | Admitting: Family Medicine

## 2020-11-30 ENCOUNTER — Encounter: Payer: Self-pay | Admitting: Family Medicine

## 2020-11-30 NOTE — Telephone Encounter (Signed)
Message routed to PCP Stroud, Natalie M, FNP . Please Advise.  

## 2020-12-01 ENCOUNTER — Telehealth (HOSPITAL_COMMUNITY): Payer: Self-pay | Admitting: General Practice

## 2020-12-01 ENCOUNTER — Encounter (HOSPITAL_COMMUNITY): Payer: Self-pay | Admitting: Family Medicine

## 2020-12-01 ENCOUNTER — Encounter: Payer: Self-pay | Admitting: Family Medicine

## 2020-12-01 ENCOUNTER — Telehealth (INDEPENDENT_AMBULATORY_CARE_PROVIDER_SITE_OTHER): Payer: Medicare Other | Admitting: Family Medicine

## 2020-12-01 ENCOUNTER — Inpatient Hospital Stay (HOSPITAL_COMMUNITY)
Admission: AD | Admit: 2020-12-01 | Discharge: 2020-12-07 | DRG: 812 | Disposition: A | Discharge status: Home / Self Care | Payer: Medicare Other | Source: Ambulatory Visit | Attending: Internal Medicine | Admitting: Internal Medicine

## 2020-12-01 ENCOUNTER — Other Ambulatory Visit: Payer: Self-pay

## 2020-12-01 VITALS — Ht 63.0 in | Wt 120.0 lb

## 2020-12-01 DIAGNOSIS — Z888 Allergy status to other drugs, medicaments and biological substances status: Secondary | ICD-10-CM

## 2020-12-01 DIAGNOSIS — K219 Gastro-esophageal reflux disease without esophagitis: Secondary | ICD-10-CM | POA: Diagnosis present

## 2020-12-01 DIAGNOSIS — D72829 Elevated white blood cell count, unspecified: Secondary | ICD-10-CM | POA: Diagnosis present

## 2020-12-01 DIAGNOSIS — F419 Anxiety disorder, unspecified: Secondary | ICD-10-CM | POA: Diagnosis present

## 2020-12-01 DIAGNOSIS — G894 Chronic pain syndrome: Secondary | ICD-10-CM | POA: Diagnosis present

## 2020-12-01 DIAGNOSIS — F1120 Opioid dependence, uncomplicated: Secondary | ICD-10-CM | POA: Diagnosis present

## 2020-12-01 DIAGNOSIS — G43909 Migraine, unspecified, not intractable, without status migrainosus: Secondary | ICD-10-CM | POA: Diagnosis present

## 2020-12-01 DIAGNOSIS — F1190 Opioid use, unspecified, uncomplicated: Secondary | ICD-10-CM | POA: Diagnosis not present

## 2020-12-01 DIAGNOSIS — G8929 Other chronic pain: Secondary | ICD-10-CM

## 2020-12-01 DIAGNOSIS — D5700 Hb-SS disease with crisis, unspecified: Secondary | ICD-10-CM | POA: Diagnosis not present

## 2020-12-01 DIAGNOSIS — Z09 Encounter for follow-up examination after completed treatment for conditions other than malignant neoplasm: Secondary | ICD-10-CM

## 2020-12-01 DIAGNOSIS — F411 Generalized anxiety disorder: Secondary | ICD-10-CM | POA: Diagnosis present

## 2020-12-01 DIAGNOSIS — Z20822 Contact with and (suspected) exposure to covid-19: Secondary | ICD-10-CM | POA: Diagnosis present

## 2020-12-01 DIAGNOSIS — D638 Anemia in other chronic diseases classified elsewhere: Secondary | ICD-10-CM | POA: Diagnosis present

## 2020-12-01 DIAGNOSIS — F17210 Nicotine dependence, cigarettes, uncomplicated: Secondary | ICD-10-CM | POA: Diagnosis present

## 2020-12-01 DIAGNOSIS — Z885 Allergy status to narcotic agent status: Secondary | ICD-10-CM

## 2020-12-01 LAB — COMPREHENSIVE METABOLIC PANEL
ALT: 30 U/L (ref 0–44)
AST: 53 U/L — ABNORMAL HIGH (ref 15–41)
Albumin: 4.4 g/dL (ref 3.5–5.0)
Alkaline Phosphatase: 73 U/L (ref 38–126)
Anion gap: 9 (ref 5–15)
BUN: 6 mg/dL (ref 6–20)
CO2: 26 mmol/L (ref 22–32)
Calcium: 9.2 mg/dL (ref 8.9–10.3)
Chloride: 103 mmol/L (ref 98–111)
Creatinine, Ser: 0.44 mg/dL (ref 0.44–1.00)
GFR, Estimated: >60 mL/min (ref >60)
Glucose, Bld: 112 mg/dL — ABNORMAL HIGH (ref 70–99)
Potassium: 3.8 mmol/L (ref 3.5–5.1)
Sodium: 138 mmol/L (ref 135–145)
Total Bilirubin: 4.7 mg/dL — ABNORMAL HIGH (ref 0.3–1.2)
Total Protein: 8 g/dL (ref 6.5–8.1)

## 2020-12-01 LAB — RESP PANEL BY RT PCR (RSV, FLU A&B, COVID)
Influenza A by PCR: NEGATIVE
Influenza B by PCR: NEGATIVE
Respiratory Syncytial Virus by PCR: NEGATIVE
SARS Coronavirus 2 by RT PCR: NEGATIVE

## 2020-12-01 LAB — CBC WITH DIFFERENTIAL/PLATELET
Abs Immature Granulocytes: 0.19 10*3/uL — ABNORMAL HIGH (ref 0.00–0.07)
Basophils Absolute: 0.1 10*3/uL (ref 0.0–0.1)
Basophils Relative: 1 %
Eosinophils Absolute: 0.4 10*3/uL (ref 0.0–0.5)
Eosinophils Relative: 2 %
HCT: 26.1 % — ABNORMAL LOW (ref 36.0–46.0)
Hemoglobin: 8.8 g/dL — ABNORMAL LOW (ref 12.0–15.0)
Immature Granulocytes: 1 %
Lymphocytes Relative: 48 %
Lymphs Abs: 8 10*3/uL — ABNORMAL HIGH (ref 0.7–4.0)
MCH: 33.7 pg (ref 26.0–34.0)
MCHC: 33.7 g/dL (ref 30.0–36.0)
MCV: 100 fL (ref 80.0–100.0)
Monocytes Absolute: 1.3 10*3/uL — ABNORMAL HIGH (ref 0.1–1.0)
Monocytes Relative: 8 %
Neutro Abs: 6.5 10*3/uL (ref 1.7–7.7)
Neutrophils Relative %: 40 %
Platelets: 375 10*3/uL (ref 150–400)
RBC: 2.61 MIL/uL — ABNORMAL LOW (ref 3.87–5.11)
RDW: 25.8 % — ABNORMAL HIGH (ref 11.5–15.5)
WBC: 16.5 10*3/uL — ABNORMAL HIGH (ref 4.0–10.5)
nRBC: 2.8 % — ABNORMAL HIGH (ref 0.0–0.2)

## 2020-12-01 LAB — URINALYSIS, COMPLETE (UACMP) WITH MICROSCOPIC
Bilirubin Urine: NEGATIVE
Glucose, UA: NEGATIVE mg/dL
Hgb urine dipstick: NEGATIVE
Ketones, ur: NEGATIVE mg/dL
Leukocytes,Ua: NEGATIVE
Nitrite: NEGATIVE
Protein, ur: NEGATIVE mg/dL
Specific Gravity, Urine: 1.013 (ref 1.005–1.030)
pH: 5 (ref 5.0–8.0)

## 2020-12-01 LAB — RETICULOCYTES
Immature Retic Fract: 31.4 % — ABNORMAL HIGH (ref 2.3–15.9)
RBC.: 2.45 MIL/uL — ABNORMAL LOW (ref 3.87–5.11)
Retic Count, Absolute: 623 10*3/uL — ABNORMAL HIGH (ref 19.0–186.0)
Retic Ct Pct: 25.4 % — ABNORMAL HIGH (ref 0.4–3.1)

## 2020-12-01 LAB — PREGNANCY, URINE: Preg Test, Ur: NEGATIVE

## 2020-12-01 MED ORDER — SODIUM CHLORIDE 0.9% FLUSH: 9.0000 mL | INTRAVENOUS | Status: DC | PRN

## 2020-12-01 MED ORDER — ENSURE ENLIVE PO LIQD
237.0000 mL | Freq: Two times a day (BID) | ORAL | Status: DC
  Administered 2020-12-02 – 2020-12-07 (×5): 237 mL via ORAL

## 2020-12-01 MED ORDER — ONDANSETRON HCL 4 MG/2ML IJ SOLN: 4.0000 mg | Freq: Four times a day (QID) | INTRAMUSCULAR | Status: DC | PRN

## 2020-12-01 MED ORDER — HYDROMORPHONE 1 MG/ML IV SOLN
INTRAVENOUS | Status: DC
Start: 2020-12-01 — End: 2020-12-07
  Administered 2020-12-01: 1.5 mg via INTRAVENOUS
  Administered 2020-12-01: 5.5 mg via INTRAVENOUS
  Administered 2020-12-01: 30 mg via INTRAVENOUS
  Administered 2020-12-02: 2 mg via INTRAVENOUS
  Administered 2020-12-02: 7 mg via INTRAVENOUS
  Administered 2020-12-02: 3.5 mg via INTRAVENOUS
  Administered 2020-12-02: 30 mg via INTRAVENOUS
  Administered 2020-12-03: 2 mg via INTRAVENOUS
  Administered 2020-12-03: 4 mg via INTRAVENOUS
  Administered 2020-12-03: 3 mg via INTRAVENOUS
  Administered 2020-12-03: 5.5 mg via INTRAVENOUS
  Administered 2020-12-03: 3 mg via INTRAVENOUS
  Administered 2020-12-03: 30 mg via INTRAVENOUS
  Administered 2020-12-03: 7.5 mg via INTRAVENOUS
  Administered 2020-12-04: 3 mg via INTRAVENOUS
  Administered 2020-12-04: 4 mg via INTRAVENOUS
  Administered 2020-12-04: 5 mg via INTRAVENOUS
  Administered 2020-12-04: 1.5 mg via INTRAVENOUS
  Administered 2020-12-04: 2.5 mg via INTRAVENOUS
  Administered 2020-12-04: 2 mg via INTRAVENOUS
  Administered 2020-12-05: 8 mg via INTRAVENOUS
  Administered 2020-12-05: 3 mg via INTRAVENOUS
  Administered 2020-12-05: 30 mg via INTRAVENOUS
  Administered 2020-12-05: 0.5 mg via INTRAVENOUS
  Administered 2020-12-05: 0 mg via INTRAVENOUS
  Administered 2020-12-05: 2 mg via INTRAVENOUS
  Administered 2020-12-06: 4 mg via INTRAVENOUS
  Administered 2020-12-06: 2.5 mg via INTRAVENOUS
  Administered 2020-12-06 (×4): 2 mg via INTRAVENOUS
  Administered 2020-12-07: 30 mg via INTRAVENOUS
  Administered 2020-12-07: 1.5 mg via INTRAVENOUS
  Filled 2020-12-01 (×5): qty 30

## 2020-12-01 MED ORDER — ACETAMINOPHEN 500 MG PO TABS
1000.0000 mg | ORAL_TABLET | Freq: Once | ORAL | Status: AC
  Administered 2020-12-01: 1000 mg via ORAL
  Filled 2020-12-01: qty 2

## 2020-12-01 MED ORDER — SODIUM CHLORIDE 0.45 % IV SOLN
INTRAVENOUS | Status: DC
Start: 2020-12-01 — End: 2020-12-01

## 2020-12-01 MED ORDER — DIPHENHYDRAMINE HCL 25 MG PO CAPS
25.0000 mg | ORAL_CAPSULE | ORAL | Status: DC | PRN
  Filled 2020-12-01: qty 1

## 2020-12-01 MED ORDER — ENOXAPARIN SODIUM 40 MG/0.4ML ~~LOC~~ SOLN
40.0000 mg | SUBCUTANEOUS | Status: DC
  Filled 2020-12-01 (×2): qty 0.4

## 2020-12-01 MED ORDER — SENNOSIDES-DOCUSATE SODIUM 8.6-50 MG PO TABS
1.0000 | ORAL_TABLET | Freq: Two times a day (BID) | ORAL | Status: DC
  Administered 2020-12-01 – 2020-12-07 (×7): 1 via ORAL
  Filled 2020-12-01 (×11): qty 1

## 2020-12-01 MED ORDER — PANTOPRAZOLE SODIUM 40 MG PO TBEC
40.0000 mg | DELAYED_RELEASE_TABLET | Freq: Every day | ORAL | Status: DC
  Administered 2020-12-01 – 2020-12-05 (×5): 40 mg via ORAL
  Filled 2020-12-01 (×6): qty 1

## 2020-12-01 MED ORDER — POLYETHYLENE GLYCOL 3350 17 G PO PACK
17.0000 g | PACK | Freq: Every day | ORAL | Status: DC | PRN
  Administered 2020-12-01 – 2020-12-06 (×6): 17 g via ORAL
  Filled 2020-12-01 (×6): qty 1

## 2020-12-01 MED ORDER — KETOROLAC TROMETHAMINE 30 MG/ML IJ SOLN
15.0000 mg | Freq: Once | INTRAMUSCULAR | Status: AC
  Administered 2020-12-01: 15 mg via INTRAVENOUS
  Filled 2020-12-01: qty 1

## 2020-12-01 MED ORDER — SODIUM CHLORIDE 0.45 % IV SOLN: INTRAVENOUS | Status: DC

## 2020-12-01 MED ORDER — FOLIC ACID 1 MG PO TABS
1.0000 mg | ORAL_TABLET | Freq: Every day | ORAL | Status: DC
  Administered 2020-12-01 – 2020-12-07 (×7): 1 mg via ORAL
  Filled 2020-12-01 (×7): qty 1

## 2020-12-01 MED ORDER — NALOXONE HCL 0.4 MG/ML IJ SOLN: 0.4000 mg | INTRAMUSCULAR | Status: DC | PRN

## 2020-12-01 MED ORDER — KETOROLAC TROMETHAMINE 15 MG/ML IJ SOLN
15.0000 mg | Freq: Four times a day (QID) | INTRAMUSCULAR | Status: AC
  Administered 2020-12-01 – 2020-12-06 (×20): 15 mg via INTRAVENOUS
  Filled 2020-12-01 (×20): qty 1

## 2020-12-01 NOTE — Progress Notes (Signed)
Virtual Visit via Telephone Note  I connected with Kara [name] on 12/01/20 at 10:20 AM EST by telephone and verified that I am speaking with the correct person using two identifiers.  Location: Patient: Home Provider: Office   I discussed the limitations, risks, security and privacy concerns of performing an evaluation and management service by telephone and the availability of in person appointments. I also discussed with the patient that there may be a patient responsible charge related to this service. The patient expressed understanding and agreed to proceed.   History of Present Illness:  Patient Active Problem List   Diagnosis Date Noted  . Pain and swelling of left lower extremity 09/01/2020  . Abscess of right buttock 04/17/2020  . 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 Sickle cell anemia with pain (HCC) 12/08/2019  . Decreased appetite 08/28/2019  . Chronic pain of right knee August 05, 2019  . Gastroesophageal reflux disease without esophagitis 08-05-2019  . Anxiety 08-05-2019  . Death of family member 08-05-19  . Chronic, continuous use of opioids 08-05-19  . Headache, new daily persistent ([facility]) 07/04/2019  . 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 Chronic pain syndrome 12/27/2018  . Anemia of chronic disease 12/27/2018  . Pulmonary hypertension (HCC) 04/04/2018  . Sickle cell disease (HCC) 04/04/2018  . Arthritis 04/04/2018  . Chest pain on breathing   . Leukocytosis 02/06/2018  . Sickle cell crisis (HCC) 02/05/2018  . Sickle cell pain crisis (HCC) 10/29/2017    Current Status: Since her last office visit, she is doing well with no complaints. She states that she has pain in all joints, with increased pain in her ankles, X 1 week now. She rates her pain today at 8/10. She has not had a hospital visit for Sickle Cell Crisis since 08/27/2020 where she was treated and discharged the same day. She is currently taking all medications as prescribed and  staying well hydrated. She reports occasional nausea, constipation, dizziness and headaches. She last took pain medications this morning. She denies fevers, chills, fatigue, recent infections, weight loss, and night sweats. She has not had any headaches, visual changes, and falls. No chest pain, heart palpitations, cough and shortness of breath reported. Denies GI problems such as vomiting, and diarrhea. She has no reports of blood in stools, dysuria and hematuria. Moderate anxiety today r/t pain crisis. She is taking all medications as prescribed.   Observations/Objective:  Telephone Virtual Visit   Assessment and Plan:  1. Sickle cell crisis [facility]) [facility] for [facility]Fs and possible IV pain medications and anti-emetics to manage her current pain crisis. After discharge she will continue to take pain medications as prescribed; will continue to avoid extreme heat and cold; will continue to eat a healthy diet and drink at least 64 ounces of water daily; continue stool softener as needed; will avoid colds and flu; will continue to get plenty of sleep and rest; will continue to avoid high stressful situations and remain infection free; will continue Folic Acid 1 mg daily to avoid sickle cell crisis. Continue to follow up with Hematologist as needed.   2. Chronic, continuous use of opioids  3. Other chronic pain  4. Anxiety  5. Follow up She will schedule follow up appointment.   No orders of the defined types were placed in this encounter.   No orders of the defined types were placed in this encounter.   Referral Orders  No referral(s) requested today  Raliegh Ip, MSN, ANE, FNP-BC [facility]/Internal Medicine/[facility] [address] [place], [place] [address] [phone] fax     I discussed the assessment and treatment plan with the patient. The patient was  provided an opportunity to ask questions and all were answered. The patient agreed with the plan and demonstrated an understanding of the instructions.   The patient was advised to call back or seek an in-person evaluation if the symptoms worsen or if the condition fails to improve as anticipated.  I provided 20 minutes of non-face-to-face time during this encounter.   [name], FNP

## 2020-12-01 NOTE — Discharge Instructions (Signed)
Sickle Cell Anemia, Adult  Sickle cell anemia is a condition where your red blood cells are shaped like sickles. Red blood cells carry oxygen through the body. Sickle-shaped cells do not live as long as normal red blood cells. They also clump together and block blood from flowing through the blood vessels. This prevents the body from getting enough oxygen. Sickle cell anemia causes organ damage and pain. It also increases the risk of infection. Follow these instructions at home: Medicines  Take over-the-counter and prescription medicines only as told by your doctor.  If you were prescribed an antibiotic medicine, take it as told by your doctor. Do not stop taking the antibiotic even if you start to feel better.  If you develop a fever, do not take medicines to lower the fever right away. Tell your doctor about the fever. Managing pain, stiffness, and swelling  Try these methods to help with pain: ? Use a heating pad. ? Take a warm bath. ? Distract yourself, such as by watching TV. Eating and drinking  Drink enough fluid to keep your pee (urine) clear or pale yellow. Drink more in hot weather and during exercise.  Limit or avoid alcohol.  Eat a healthy diet. Eat plenty of fruits, vegetables, whole grains, and lean protein.  Take vitamins and supplements as told by your doctor. Traveling  When traveling, keep these with you: ? Your medical information. ? The names of your doctors. ? Your medicines.  If you need to take an airplane, talk to your doctor first. Activity  Rest often.  Avoid exercises that make your heart beat much faster, such as jogging. General instructions  Do not use products that have nicotine or tobacco, such as cigarettes and e-cigarettes. If you need help quitting, ask your doctor.  Consider wearing a medical alert bracelet.  Avoid being in high places (high altitudes), such as mountains.  Avoid very hot or cold temperatures.  Avoid places where the  temperature changes a lot.  Keep all follow-up visits as told by your doctor. This is important. Contact a doctor if:  A joint hurts.  Your feet or hands hurt or swell.  You feel tired (fatigued). Get help right away if:  You have symptoms of infection. These include: ? Fever. ? Chills. ? Being very tired. ? Irritability. ? Poor eating. ? Throwing up (vomiting).  You feel dizzy or faint.  You have new stomach pain, especially on the left side.  You have a an erection (priapism) that lasts more than 4 hours.  You have numbness in your arms or legs.  You have a hard time moving your arms or legs.  You have trouble talking.  You have pain that does not go away when you take medicine.  You are short of breath.  You are breathing fast.  You have a long-term cough.  You have pain in your chest.  You have a bad headache.  You have a stiff neck.  Your stomach looks bloated even though you did not eat much.  Your skin is pale.  You suddenly cannot see well. Summary  Sickle cell anemia is a condition where your red blood cells are shaped like sickles.  Follow your doctor's advice on ways to manage pain, food to eat, activities to do, and steps to take for safe travel.  Get medical help right away if you have any signs of infection, such as a fever. This information is not intended to replace advice given to you by   your health care provider. Make sure you discuss any questions you have with your health care provider. Document Revised: 04/01/2020 Document Reviewed: 04/01/2020 Elsevier Patient Education  2021 [employer]  

## 2020-12-01 NOTE — Telephone Encounter (Signed)
Patient came to the lobby, requesting to come to the day hospital due to generalized pain rated at 8/10. Denied chest pain, fever, diarrhea, abdominal pain, nausea/vomitting. Screened negative for Covid-19 symptoms. Admitted to having means of transportation without driving self after treatment. Last took 15 mg of oxycodone at 03:00 am today. Per provider, patient can come to the day hospital for treatment. Patient notified, verbalized understanding.

## 2020-12-01 NOTE — Progress Notes (Signed)
Patient admitted to the day hospital for treatment of sickle cell pain crisis. Patient reported generalized pain rated 8/10. Patient placed on Dilaudid PCA, given PO tylenol, IV Toradol and hydrated with IV fluids. Transfered to [place] 6 east room 07. Report was given to [facility]. Pain on transfer was 9/10. Alert, oriented and transported in a wheelchair.

## 2020-12-02 DIAGNOSIS — D5700 Hb-SS disease with crisis, unspecified: Secondary | ICD-10-CM | POA: Diagnosis not present

## 2020-12-02 LAB — BASIC METABOLIC PANEL
Anion gap: 8 (ref 5–15)
BUN: 6 mg/dL (ref 6–20)
CO2: 25 mmol/L (ref 22–32)
Calcium: 8.6 mg/dL — ABNORMAL LOW (ref 8.9–10.3)
Chloride: 102 mmol/L (ref 98–111)
Creatinine, Ser: 0.42 mg/dL — ABNORMAL LOW (ref 0.44–1.00)
GFR, Estimated: >60 mL/min (ref >60)
Glucose, Bld: 104 mg/dL — ABNORMAL HIGH (ref 70–99)
Potassium: 4 mmol/L (ref 3.5–5.1)
Sodium: 135 mmol/L (ref 135–145)

## 2020-12-02 LAB — CBC
HCT: 22.4 % — ABNORMAL LOW (ref 36.0–46.0)
Hemoglobin: 7.7 g/dL — ABNORMAL LOW (ref 12.0–15.0)
MCH: 34.2 pg — ABNORMAL HIGH (ref 26.0–34.0)
MCHC: 34.4 g/dL (ref 30.0–36.0)
MCV: 99.6 fL (ref 80.0–100.0)
Platelets: 296 10*3/uL (ref 150–400)
RBC: 2.25 MIL/uL — ABNORMAL LOW (ref 3.87–5.11)
RDW: 24.9 % — ABNORMAL HIGH (ref 11.5–15.5)
WBC: 13 10*3/uL — ABNORMAL HIGH (ref 4.0–10.5)
nRBC: 3.3 % — ABNORMAL HIGH (ref 0.0–0.2)

## 2020-12-02 LAB — TYPE AND SCREEN
ABO/RH(D): A POS
Antibody Screen: NEGATIVE

## 2020-12-02 MED ORDER — OXYCODONE HCL 5 MG PO TABS
15.0000 mg | ORAL_TABLET | ORAL | Status: DC | PRN
  Administered 2020-12-02 – 2020-12-07 (×15): 15 mg via ORAL
  Filled 2020-12-02 (×15): qty 3

## 2020-12-02 MED ORDER — LIP MEDEX EX OINT
TOPICAL_OINTMENT | CUTANEOUS | Status: AC
  Filled 2020-12-02: qty 7

## 2020-12-02 NOTE — Progress Notes (Signed)
Initial Nutrition Assessment  INTERVENTION:   -Ensure Enlive po BID, each supplement provides 350 kcal and 20 grams of protein  NUTRITION DIAGNOSIS:   Inadequate oral intake related to poor appetite as evidenced by per patient/family report.  GOAL:   Patient will meet greater than or equal to 90% of their needs  MONITOR:   PO intake,Supplement acceptance,Labs,Weight trends,I & O's  REASON FOR ASSESSMENT:   Malnutrition Screening Tool    ASSESSMENT:   35 y.o. female with a medical history significant for sickle cell disease, chronic pain syndrome, opiate dependence and tolerance, history of anemia of chronic disease, and history of generalized anxiety disorder presented to the infusion clinic with complaints of generalized pain that is consistent with previous sickle cell pain crisis.  Patient reports decreased appetite d/t sickle cell pain crisis. Pt drinks Ensure supplements at home. Has been ordered.  Per weight records, weight is stable.   Labs reviewed. Medications: Folic acid, Senokot  NUTRITION - FOCUSED PHYSICAL EXAM:  Unable to complete  Diet Order:   Diet Order            Diet regular Room service appropriate? Yes  Diet effective now                 EDUCATION NEEDS:   No education needs have been identified at this time  Skin:  Skin Assessment: Reviewed RN Assessment  Last BM:  1/12  Height:   Ht Readings from Last 1 Encounters:  12/01/20 5\' 3"  (1.6 m)    Weight:   Wt Readings from Last 1 Encounters:  12/01/20 54.4 kg   BMI: 20.4 kg/m^2  Estimated Nutritional Needs:   Kcal:  1450-1650  Protein:  65-75g  Fluid:  1.6L/day  01/29/21, MS, RD, LDN Inpatient Clinical Dietitian Contact information available via Amion

## 2020-12-02 NOTE — Progress Notes (Signed)
Subjective: Kara [name] is a 35 year old female with a medical history significant for sickle cell disease, chronic pain syndrome, opiate dependence and tolerance, and history of anemia of chronic disease that was admitted with sickle cell pain crisis. Patient says that pain intensity continues to be elevated on today.  She rates pain as 8/10 primarily to bilateral lower extremities.  Patient denies headache, fatigue, dizziness, chest pain, shortness of breath, nausea, vomiting, or diarrhea.  Objective:  Vital signs in last 24 hours:  Vitals:   12/02/20 0140 12/02/20 0401 12/02/20 0541 12/02/20 1004  BP: (!) 93/47  (!) 99/46 (!) 114/56  Pulse: 68  67 74  Resp: 14 18 16 12   Temp: 98.5 F (36.9 C)  98.4 F (36.9 C) 98.5 F (36.9 C)  TempSrc: Oral  Oral Oral  SpO2: 90% 98% 96% 97%    Intake/Output from previous day:   Intake/Output Summary (Last 24 hours) at 12/02/2020 1155 Last data filed at 12/02/2020 0345 Gross per 24 hour  Intake 1953.31 ml  Output 450 ml  Net 1503.31 ml    Physical Exam: General: Alert, awake, oriented x3, in no acute distress.  HEENT: [place]/AT PEERL, EOMI Neck: Trachea midline,  no masses, no thyromegal,y no JVD, no carotid bruit OROPHARYNX:  Moist, No exudate/ erythema/lesions.  Heart: Regular rate and rhythm, without murmurs, rubs, gallops, PMI non-displaced, no heaves or thrills on palpation.  Lungs: Clear to auscultation, no wheezing or rhonchi noted. No increased vocal fremitus resonant to percussion  Abdomen: Soft, nontender, nondistended, positive bowel sounds, no masses no hepatosplenomegaly noted..  Neuro: No focal neurological deficits noted cranial nerves II through XII grossly intact. DTRs 2+ bilaterally upper and lower extremities. Strength 5 out of 5 in bilateral upper and lower extremities. Musculoskeletal: No warm swelling or erythema around joints, no spinal tenderness noted. Psychiatric: Patient alert and oriented x3, good insight and  cognition, good recent to remote recall. Lymph node survey: No cervical axillary or inguinal lymphadenopathy noted.  Lab Results:  Basic Metabolic Panel:    Component Value Date/Time   NA 135 12/02/2020 0642   NA 138 07/14/2019 1128   K 4.0 12/02/2020 0642   CL 102 12/02/2020 0642   CO2 25 12/02/2020 0642   BUN 6 12/02/2020 0642   BUN 7 07/14/2019 1128   CREATININE 0.42 (L) 12/02/2020 0642   GLUCOSE 104 (H) 12/02/2020 0642   CALCIUM 8.6 (L) 12/02/2020 0642   CBC:    Component Value Date/Time   WBC 13.0 (H) 12/02/2020 0642   HGB 7.7 (L) 12/02/2020 0642   HGB 9.1 (L) 07/14/2019 1128   HCT 22.4 (L) 12/02/2020 0642   HCT 27.8 (L) 07/14/2019 1128   PLT 296 12/02/2020 0642   PLT 435 07/14/2019 1128   MCV 99.6 12/02/2020 0642   MCV 103 (H) 07/14/2019 1128   NEUTROABS 6.5 12/01/2020 1205   NEUTROABS 5.0 07/14/2019 1128   LYMPHSABS 8.0 (H) 12/01/2020 1205   LYMPHSABS 7.3 (H) 07/14/2019 1128   MONOABS 1.3 (H) 12/01/2020 1205   EOSABS 0.4 12/01/2020 1205   EOSABS 0.3 07/14/2019 1128   BASOSABS 0.1 12/01/2020 1205   BASOSABS 0.1 07/14/2019 1128    Recent Results (from the past 240 hour(s))  Resp Panel by RT PCR (RSV, Flu A&B, Covid) -     Status: None   Collection Time: 12/01/20  5:28 PM  Result Value Ref Range Status   SARS Coronavirus 2 by RT PCR NEGATIVE NEGATIVE Final   Influenza A by PCR NEGATIVE  NEGATIVE Final   Influenza B by PCR NEGATIVE NEGATIVE Final   Respiratory Syncytial Virus by PCR NEGATIVE NEGATIVE Final    Comment: Performed at [facility], [address], [place], [place] [address]    Studies/Results: No results found.  Medications: Scheduled Meds: . enoxaparin (LOVENOX) injection  40 mg Subcutaneous Q24H  . feeding supplement  237 mL Oral BID BM  . folic acid  1 mg Oral Daily  . HYDROmorphone   Intravenous Q4H  . ketorolac  15 mg Intravenous Q6H  . pantoprazole  40 mg Oral Daily  . senna-docusate  1 tablet Oral BID    Continuous Infusions: . sodium chloride 75 mL/hr at 12/02/20 0345   PRN Meds:.diphenhydrAMINE, naloxone **AND** sodium chloride flush, ondansetron (ZOFRAN) IV, oxyCODONE, polyethylene glycol  Consultants:  None  Procedures:  None  Antibiotics:  None  Assessment/Plan: Principal Problem:   Sickle cell pain crisis (HCC) Active Problems:   Leukocytosis   Chronic pain syndrome   Anxiety   Chronic, continuous use of opioids   Sickle cell disease with pain crisis: Continue IV Dilaudid PCA without any changes in settings today Resume oxycodone 15 mg every 4 hours as needed for severe breakthrough pain Continue Toradol 15 mg IV every 6 hours for total of 5 days Continue [facility] fluids at [facility] Monitor vital signs very closely, reevaluate pain scale regularly, and supplemental oxygen as needed  Chronic pain syndrome: Continue home medications  Leukocytosis: WBCs 13.0, improved from previous.  More than likely secondary to vaso-occlusive pain crisis.  Continue to follow closely without antibiotics.  Labs in AM.  Sickle cell anemia: Hemoglobin decreased to 7.7 from 8.8 on yesterday.  Baseline is 8-9 g/dL.  No blood transfusion warranted at this time.  Continue to follow closely.  Code Status: Full Code Family Communication: N/A Disposition Plan: Not yet ready for discharge  Kara [name] [name]  APRN, MSN, FNP-C [facility] [address] [place], [place] [address] [phone]  If 5PM-8AM, please contact night-coverage.  12/02/2020, 11:55 AM  LOS: 1 day

## 2020-12-02 NOTE — H&P (Signed)
H&P  Patient Demographics:  Kara [name], is a 35 y.o. female  MRN: [id number]   DOB - [birth year]  Admit Date - 12/01/2020  Outpatient Primary MD for the patient is [name], FNP      HPI:   Kara [name]  is a 35 y.o. female with a medical history significant for sickle cell disease, chronic pain syndrome, opiate dependence and tolerance, history of anemia of chronic disease, and history of generalized anxiety disorder presented to the infusion clinic with complaints of generalized pain that is consistent with previous sickle cell pain crisis.  Patient states the pain intensity has been elevated over the past week and unrelieved by home medications.  She attributes pain crisis to starting a new online exercise routine.  Patient consistently exercise 4 days in a row and states that her lower extremities started to swell on the fourth day of exercise.  She states that she has been taking oxycodone consistently without sustained relief.  Current pain intensity is 9/10 characterized as constant and aching.  Patient states that she has increased pain to feet bilaterally.  She denies any recent falls or injuries.  No fever, chills, urinary symptoms, nausea, vomiting, or diarrhea.  No chest pain or shortness of breath.  No sick contacts, recent travel, or known exposure to COVID-19 infection.  Sickle cell day clinic course: Vital signs show: Temperature 98.1 F, blood pressure 108/61, pulse 94, respirations 16, and oxygen saturation 99% on RA.  Complete blood count shows WBC 16.5, hemoglobin 8.8, and platelets 375,000.  Complete metabolic panel show: AST 53 and total bilirubin elevated at 4.7.  SARS COVID-19 pending.  Pain persists despite IV Dilaudid PCA, IV fluids, IV Toradol, and Tylenol.  Patient admitted to MedSurg for further management acute sickle cell pain crisis.   Review of systems:  In addition to the HPI above, patient reports No fever or chills No Headache, No changes with  vision or hearing No problems swallowing food or liquids No chest pain, cough or shortness of breath No abdominal pain, No nausea or vomiting, Bowel movements are regular No blood in stool or urine No dysuria No new skin rashes or bruises No new joints pains-aches No new weakness, tingling, numbness in any extremity No recent weight gain or loss No polyuria, polydypsia or polyphagia No significant Mental Stressors  A full 10 point Review of Systems was done, except as stated above, all other Review of Systems were negative.  With Past History of the following :   Past Medical History:  Diagnosis Date  . Arthritis   . Chronic migraine   . GERD (gastroesophageal reflux disease)   . Hx of cholecystectomy 2015  . Pulmonary hypertension (HCC)   . Sickle cell anemia (HCC)   . Tendinitis    left elbow      Past Surgical History:  Procedure Laterality Date  . DILATION AND CURETTAGE OF UTERUS    . GALLBLADDER SURGERY       Social History:   Social History   Tobacco Use  . Smoking status: Current Every Day Smoker    Packs/day: 0.50  . Smokeless tobacco: Never Used  Substance Use Topics  . Alcohol use: No     Lives - At home   Family History :   Family History  Adopted: Yes     Home Medications:   Prior to Admission medications   Medication Sig Start Date End Date Taking? Authorizing Provider  butalbital-acetaminophen-caffeine (FIORICET) 50-325-40 MG tablet Take 1  tablet by mouth every 6 (six) hours as needed for headache. 06/11/20  Yes [name], FNP  cetirizine (ZYRTEC) 10 MG tablet Take 1 tablet (10 mg total) by mouth daily. Patient taking differently: Take 10 mg by mouth daily as needed for allergies. 04/14/20  Yes [name], FNP  Ensure Plus (ENSURE PLUS) LIQD Take 237 mLs by mouth 3 (three) times daily between meals. Patient taking differently: Take 237 mLs by mouth daily as needed (meal replacement). 08/26/19  Yes [name], FNP  folic  acid (FOLVITE) 1 MG tablet Take 1 tablet (1 mg total) by mouth daily. 04/14/20  Yes [name], FNP  hydrOXYzine (ATARAX/VISTARIL) 10 MG tablet Take 10 mg by mouth in the morning and at bedtime.   Yes [provider]  methocarbamol (ROBAXIN) 500 MG tablet Take 1 tablet (500 mg total) by mouth in the morning and at bedtime. Patient taking differently: Take 500 mg by mouth in the morning, at noon, in the evening, and at bedtime. 04/14/20  Yes [name], FNP  naproxen (NAPROSYN) 500 MG tablet Take 1 tablet (500 mg total) by mouth 2 (two) times daily with a meal. 11/27/20  Yes [name], FNP  omeprazole (PRILOSEC) 40 MG capsule Take 1 capsule (40 mg total) by mouth daily. 04/14/20  Yes [name], FNP  oxyCODONE (ROXICODONE) 15 MG immediate release tablet Take 1 tablet (15 mg total) by mouth every 4 (four) hours as needed for up to 15 days. 11/30/20 12/15/20 Yes [name], FNP  Vitamin D, Ergocalciferol, (DRISDOL) 1.25 MG (50000 UNIT) CAPS capsule Take 1 capsule (50,000 Units total) by mouth every 7 (seven) days. Patient taking differently: Take 50,000 Units by mouth every 7 (seven) days. Mondays 04/14/20  Yes [name], FNP  Benzocaine 20 % OINT Apply 1 application topically 4 (four) times daily as needed. 04/02/20   Barbette Merino, NP     Allergies:   Allergies  Allergen Reactions  . Kiwi Extract Hives  . Morphine And Related Hives  . Paroxetine Hives     Physical Exam:   Vitals:   Vitals:   12/02/20 0401 12/02/20 0541  BP:  (!) 99/46  Pulse:  67  Resp: 18 16  Temp:  98.4 F (36.9 C)  SpO2: 98% 96%    Physical Exam: Constitutional: Patient appears well-developed and well-nourished. Not in obvious distress. HENT: Normocephalic, atraumatic, External right and left ear normal. Oropharynx is clear and moist.  Eyes: Conjunctivae and EOM are normal. PERRLA, no scleral icterus. Neck: Normal ROM. Neck supple. No JVD. No tracheal deviation.  No thyromegaly. CVS: RRR, S1/S2 +, no murmurs, no gallops, no carotid bruit.  Pulmonary: Effort and breath sounds normal, no stridor, rhonchi, wheezes, rales.  Abdominal: Soft. BS +, no distension, tenderness, rebound or guarding.  Musculoskeletal: Normal range of motion. No edema and no tenderness.  Lymphadenopathy: No lymphadenopathy noted, cervical, inguinal or axillary Neuro: Alert. Normal reflexes, muscle tone coordination. No cranial nerve deficit. Skin: Skin is warm and dry. No rash noted. Not diaphoretic. No erythema. No pallor. Psychiatric: Normal mood and affect. Behavior, judgment, thought content normal.   Data Review:   CBC Recent Labs  Lab 12/01/20 1205 12/02/20 0642  WBC 16.5* 13.0*  HGB 8.8* 7.7*  HCT 26.1* 22.4*  PLT 375 296  MCV 100.0 99.6  MCH 33.7 34.2*  MCHC 33.7 34.4  RDW 25.8* 24.9*  LYMPHSABS 8.0*  --   MONOABS 1.3*  --  EOSABS 0.4  --   BASOSABS 0.1  --    ------------------------------------------------------------------------------------------------------------------  Chemistries  Recent Labs  Lab 12/01/20 1205 12/02/20 0642  NA 138 135  K 3.8 4.0  CL 103 102  CO2 26 25  GLUCOSE 112* 104*  BUN 6 6  CREATININE 0.44 0.42*  CALCIUM 9.2 8.6*  AST 53*  --   ALT 30  --   ALKPHOS 73  --   BILITOT 4.7*  --    ------------------------------------------------------------------------------------------------------------------ estimated creatinine clearance is 82 mL/min (A) (by C-G formula based on SCr of 0.42 mg/dL (L)). ------------------------------------------------------------------------------------------------------------------ No results for input(s): TSH, T4TOTAL, T3FREE, THYROIDAB in the last 72 hours.  Invalid input(s): FREET3  Coagulation profile No results for input(s): INR, PROTIME in the last 168 hours. ------------------------------------------------------------------------------------------------------------------- No results  for input(s): DDIMER in the last 72 hours. -------------------------------------------------------------------------------------------------------------------  Cardiac Enzymes No results for input(s): CKMB, TROPONINI, MYOGLOBIN in the last 168 hours.  Invalid input(s): CK ------------------------------------------------------------------------------------------------------------------ No results found for: BNP  ---------------------------------------------------------------------------------------------------------------  Urinalysis    Component Value Date/Time   COLORURINE AMBER (A) 12/01/2020 2125   APPEARANCEUR HAZY (A) 12/01/2020 2125   LABSPEC 1.013 12/01/2020 2125   PHURINE 5.0 12/01/2020 2125   GLUCOSEU NEGATIVE 12/01/2020 2125   HGBUR NEGATIVE 12/01/2020 2125   BILIRUBINUR NEGATIVE 12/01/2020 2125   BILIRUBINUR small 06/11/2020 1217   KETONESUR NEGATIVE 12/01/2020 2125   PROTEINUR NEGATIVE 12/01/2020 2125   UROBILINOGEN >=8.0 (A) 06/11/2020 1217   NITRITE NEGATIVE 12/01/2020 2125   LEUKOCYTESUR NEGATIVE 12/01/2020 2125    ----------------------------------------------------------------------------------------------------------------   Imaging Results:    No results found.   Assessment & Plan:  Principal Problem:   Sickle cell pain crisis (HCC) Active Problems:   Leukocytosis   Chronic pain syndrome   Anxiety   Chronic, continuous use of opioids  Sickle cell disease with pain crisis: Pain persists despite pain management and extended evaluation and the sickle cell day infusion clinic.  Patient admitted to MedSurg.  We will continue IV Dilaudid PCA with settings of 0.5 mg, 10-minute lockout, and 3 mg/h. Toradol 15 mg IV every 6 hours for total of 5 days IV fluids, 0.45% saline at 75 mL/h Hold home oxycodone, use PCA Dilaudid.  Will transition to oral pain medication as pain improves. Monitor vital signs very closely, reevaluate pain scale regularly,  supplemental oxygen as needed. Patient will be reevaluated for pain in the context of function and relationship to baseline as her care progresses.  Chronic pain syndrome: Resume home medications  Leukocytosis: WBC 16.5.  Patient is afebrile without any signs of infection or inflammation.  COVID-19 pending.  Urinalysis and urine culture pending.  Continue to follow closely without antibiotics.  Repeat CBC in AM.  Sickle cell anemia: Hemoglobin is 8.8, which appears to be consistent with patient's baseline.  There is no clinical indication for blood transfusion at this time.  Continue to follow closely.  Generalized anxiety disorder: Patient is currently not on medications for anxiety.  She has been lost to follow-up with psychiatry.  She denies any suicidal or homicidal intentions at this time.  Continue to monitor closely.   DVT Prophylaxis: Subcut Lovenox   AM Labs Ordered, also please review Full Orders  Family Communication: Admission, patient's condition and plan of care including tests being ordered have been discussed with the patient who indicate understanding and agree with the plan and Code Status.  Code Status: Full Code  Consults called: None    Admission status: Inpatient  Time spent in minutes : 35 minutes  Nolon NationsLachina Moore Vergie Zahm  APRN, MSN, [facility]NP-C [facility] [address]  [place], [place] [id number] [phone]  12/02/2020 at 8:38 AM

## 2020-12-03 ENCOUNTER — Ambulatory Visit: Payer: Medicare Other | Admitting: Family Medicine

## 2020-12-03 DIAGNOSIS — F1190 Opioid use, unspecified, uncomplicated: Secondary | ICD-10-CM

## 2020-12-03 DIAGNOSIS — D72829 Elevated white blood cell count, unspecified: Secondary | ICD-10-CM

## 2020-12-03 DIAGNOSIS — G894 Chronic pain syndrome: Secondary | ICD-10-CM

## 2020-12-03 DIAGNOSIS — D5700 Hb-SS disease with crisis, unspecified: Secondary | ICD-10-CM | POA: Diagnosis not present

## 2020-12-03 DIAGNOSIS — F419 Anxiety disorder, unspecified: Secondary | ICD-10-CM | POA: Diagnosis not present

## 2020-12-03 LAB — URINE CULTURE: Culture: <10000 — AB

## 2020-12-03 NOTE — Care Management Important Message (Signed)
Important Message  Patient Details IM letter given to the Patient. Name: Citlally Captain MRN: [id number] Date of Birth: 1986/09/25   Medicare Important Message Given:  Yes     Caren Macadam 12/03/2020, 11:21 AM

## 2020-12-03 NOTE — Progress Notes (Signed)
Subjective: Kara [name] is a 35 year old female with a medical history significant for sickle cell disease, chronic pain syndrome, opiate dependence and tolerance, and history of anemia of chronic disease that was admitted with sickle cell pain crisis. Patient has no new complaints on today.  She continues to have pain to low back, upper and lower extremities.  Pain intensity is 7/10.  She denies any headache, fatigue, dizziness, chest pain, shortness of breath, nausea, vomiting, or diarrhea.  Objective:  Vital signs in last 24 hours:  Vitals:   12/03/20 0430 12/03/20 0500 12/03/20 0800 12/03/20 1103  BP:  (!) 100/58  (!) 105/53  Pulse:  71  83  Resp: 18 18 16 15   Temp:  99.3 F (37.4 C)  98.5 F (36.9 C)  TempSrc:    Oral  SpO2: 98% 98% 100% 100%    Intake/Output from previous day:   Intake/Output Summary (Last 24 hours) at 12/03/2020 1305 Last data filed at 12/02/2020 1610 Gross per 24 hour  Intake 120 ml  Output --  Net 120 ml    Physical Exam: General: Alert, awake, oriented x3, in no acute distress.  HEENT: [place]/AT PEERL, EOMI Neck: Trachea midline,  no masses, no thyromegal,y no JVD, no carotid bruit OROPHARYNX:  Moist, No exudate/ erythema/lesions.  Heart: Regular rate and rhythm, without murmurs, rubs, gallops, PMI non-displaced, no heaves or thrills on palpation.  Lungs: Clear to auscultation, no wheezing or rhonchi noted. No increased vocal fremitus resonant to percussion  Abdomen: Soft, nontender, nondistended, positive bowel sounds, no masses no hepatosplenomegaly noted..  Neuro: No focal neurological deficits noted cranial nerves II through XII grossly intact. DTRs 2+ bilaterally upper and lower extremities. Strength 5 out of 5 in bilateral upper and lower extremities. Musculoskeletal: No warm swelling or erythema around joints, no spinal tenderness noted. Psychiatric: Patient alert and oriented x3, good insight and cognition, good recent to remote recall. Lymph  node survey: No cervical axillary or inguinal lymphadenopathy noted.  Lab Results:  Basic Metabolic Panel:    Component Value Date/Time   NA 135 12/02/2020 0642   NA 138 07/14/2019 1128   K 4.0 12/02/2020 0642   CL 102 12/02/2020 0642   CO2 25 12/02/2020 0642   BUN 6 12/02/2020 0642   BUN 7 07/14/2019 1128   CREATININE 0.42 (L) 12/02/2020 0642   GLUCOSE 104 (H) 12/02/2020 0642   CALCIUM 8.6 (L) 12/02/2020 0642   CBC:    Component Value Date/Time   WBC 13.0 (H) 12/02/2020 0642   HGB 7.7 (L) 12/02/2020 0642   HGB 9.1 (L) 07/14/2019 1128   HCT 22.4 (L) 12/02/2020 0642   HCT 27.8 (L) 07/14/2019 1128   PLT 296 12/02/2020 0642   PLT 435 07/14/2019 1128   MCV 99.6 12/02/2020 0642   MCV 103 (H) 07/14/2019 1128   NEUTROABS 6.5 12/01/2020 1205   NEUTROABS 5.0 07/14/2019 1128   LYMPHSABS 8.0 (H) 12/01/2020 1205   LYMPHSABS 7.3 (H) 07/14/2019 1128   MONOABS 1.3 (H) 12/01/2020 1205   EOSABS 0.4 12/01/2020 1205   EOSABS 0.3 07/14/2019 1128   BASOSABS 0.1 12/01/2020 1205   BASOSABS 0.1 07/14/2019 1128    Recent Results (from the past 240 hour(s))  Resp Panel by RT PCR (RSV, Flu A&B, Covid) -     Status: None   Collection Time: 12/01/20  5:28 PM  Result Value Ref Range Status   SARS Coronavirus 2 by RT PCR NEGATIVE NEGATIVE Final   Influenza A by PCR  NEGATIVE NEGATIVE Final   Influenza B by PCR NEGATIVE NEGATIVE Final   Respiratory Syncytial Virus by PCR NEGATIVE NEGATIVE Final    Comment: Performed at [facility], [address], [place], [place] [address]  Urine culture     Status: Abnormal   Collection Time: 12/01/20  8:45 PM   Specimen: Urine, Random  Result Value Ref Range Status   Specimen Description   Final    URINE, RANDOM Performed at [facility], [address] [place], [place] [address]    Special Requests   Final    NONE Performed at [facility], [address] [place], [place] [address]     Culture (A)  Final    <10,000 COLONIES/mL INSIGNIFICANT GROWTH Performed at [facility], [address], [place], [place] [address]    Report Status 12/03/2020 FINAL  Final    Studies/Results: No results found.  Medications: Scheduled Meds: . enoxaparin (LOVENOX) injection  40 mg Subcutaneous Q24H  . feeding supplement  237 mL Oral BID BM  . folic acid  1 mg Oral Daily  . HYDROmorphone   Intravenous Q4H  . ketorolac  15 mg Intravenous Q6H  . pantoprazole  40 mg Oral Daily  . senna-docusate  1 tablet Oral BID   Continuous Infusions: . sodium chloride 10 mL/hr at 12/02/20 1322   PRN Meds:.diphenhydrAMINE, naloxone **AND** sodium chloride flush, ondansetron (ZOFRAN) IV, oxyCODONE, polyethylene glycol  Consultants:  None  Procedures:  None  Antibiotics:  None  Assessment/Plan: Principal Problem:   Sickle cell pain crisis (HCC) Active Problems:   Leukocytosis   Chronic pain syndrome   Anxiety   Chronic, continuous use of opioids  Sickle cell disease with pain crisis: Continue IV Dilaudid PCA without any changes in settings Oxycodone 15 mg every 4 hours as needed for severe breakthrough pain Toradol 15 mg IV every 6 hours for total of 5 days Monitor vital signs very closely, reevaluate pain scale regularly, and supplemental oxygen as needed  Chronic pain syndrome: Continue home medications  Leukocytosis: WBC stable and consistent with patient's baseline.  More than likely secondary to vaso-occlusive pain crisis.  Continue to follow closely without antibiotics.  Labs in AM.  Sickle cell anemia: Hemoglobin continues to be stable.  No blood transfusion warranted at this time.  Follow labs in AM.    Code Status: Full Code Family Communication: N/A Disposition Plan: Not yet ready for discharge  Nolon Nations  APRN, MSN, FNP-C [facility] [address] [place], [place] [address] [phone] If 7PM-7AM,  please contact night-coverage.  12/03/2020, 1:05 PM  LOS: 2 days

## 2020-12-04 DIAGNOSIS — D5700 Hb-SS disease with crisis, unspecified: Secondary | ICD-10-CM | POA: Diagnosis not present

## 2020-12-04 LAB — CBC
HCT: 22.2 % — ABNORMAL LOW (ref 36.0–46.0)
Hemoglobin: 7.6 g/dL — ABNORMAL LOW (ref 12.0–15.0)
MCH: 33.6 pg (ref 26.0–34.0)
MCHC: 34.2 g/dL (ref 30.0–36.0)
MCV: 98.2 fL (ref 80.0–100.0)
Platelets: 333 10*3/uL (ref 150–400)
RBC: 2.26 MIL/uL — ABNORMAL LOW (ref 3.87–5.11)
RDW: 23.8 % — ABNORMAL HIGH (ref 11.5–15.5)
WBC: 10.9 10*3/uL — ABNORMAL HIGH (ref 4.0–10.5)
nRBC: 2 % — ABNORMAL HIGH (ref 0.0–0.2)

## 2020-12-04 NOTE — Progress Notes (Signed)
Subjective: 35 year old female admitted with sickle cell painful crisis.  Patient still complains of 8 out of 10 pain in the backs of legs.  Also complaining of persistent swelling of her legs.  No fever or chills no nausea vomiting or diarrhea.  She has been on the Dilaudid PCA with Toradol and IV fluids.  Objective: Vital signs in last 24 hours: Temp:  [98.2 F (36.8 C)-98.6 F (37 C)] 98.6 F (37 C) (01/15 0444) Pulse Rate:  [64-83] 67 (01/15 0444) Resp:  [14-16] 14 (01/15 0444) BP: (87-107)/(50-53) 87/53 (01/15 0444) SpO2:  [88 %-100 %] 90 % (01/15 0444) Weight:  [54.5 kg] 54.5 kg (01/14 2225) Weight change:  Last BM Date: 12/03/20  Intake/Output from previous day: 01/14 0701 - 01/15 0700 In: 650 [P.O.:440; I.V.:210] Out: -  Intake/Output this shift: No intake/output data recorded.  General appearance: alert, cooperative and no distress Neck: no adenopathy, no carotid bruit, no JVD, supple, symmetrical, trachea midline and thyroid not enlarged, symmetric, no tenderness/mass/nodules Back: symmetric, no curvature. ROM normal. No CVA tenderness. Resp: clear to auscultation bilaterally Cardio: regular rate and rhythm, S1, S2 normal, no murmur, click, rub or gallop GI: soft, non-tender; bowel sounds normal; no masses,  no organomegaly Extremities: extremities normal, atraumatic, no cyanosis or edema Skin: Skin color, texture, turgor normal. No rashes or lesions Neurologic: Grossly normal  Lab Results: Recent Labs    12/01/20 1205 12/02/20 0642  WBC 16.5* 13.0*  HGB 8.8* 7.7*  HCT 26.1* 22.4*  PLT 375 296   BMET Recent Labs    12/01/20 1205 12/02/20 0642  NA 138 135  K 3.8 4.0  CL 103 102  CO2 26 25  GLUCOSE 112* 104*  BUN 6 6  CREATININE 0.44 0.42*  CALCIUM 9.2 8.6*    Studies/Results: No results found.  Medications: I have reviewed the patient's current medications.  Assessment/Plan: 35 year old female admitted with sickle cell painful crisis.  #1  sickle cell painful crisis, patient will be maintained on current regimen.  Titration of the PCA.  Completed 5 days of Toradol.  Continue oxycodone.  #2 anemia of chronic disease: Continue monitoring H&H.  So far appears to be close to baseline.  #3 chronic pain syndrome: Continue the Roxicodone.    LOS: 3 days   Astor Gentle,LAWAL 12/04/2020, 8:21 AM

## 2020-12-05 NOTE — Progress Notes (Signed)
Subjective: Patient slowly improving down to 6 out of 10 today.  She has more leg swelling today.  Has not been moving around.  No fever or chills no nausea vomiting or diarrhea she has been on the Dilaudid PCA with Toradol and IV fluids.  Objective: Vital signs in last 24 hours: Temp:  [98 F (36.7 C)-99.3 F (37.4 C)] 98 F (36.7 C) (01/16 2121) Pulse Rate:  [71-85] 85 (01/16 2121) Resp:  [13-18] 15 (01/16 2121) BP: (93-111)/(45-65) 103/48 (01/16 2121) SpO2:  [92 %-100 %] 100 % (01/16 1939) Weight:  [58.8 kg] 58.8 kg (01/16 0550) Weight change: 4.3 kg Last BM Date: 12/03/20  Intake/Output from previous day: No intake/output data recorded. Intake/Output this shift: No intake/output data recorded.  General appearance: alert, cooperative and no distress Neck: no adenopathy, no carotid bruit, no JVD, supple, symmetrical, trachea midline and thyroid not enlarged, symmetric, no tenderness/mass/nodules Back: symmetric, no curvature. ROM normal. No CVA tenderness. Resp: clear to auscultation bilaterally Cardio: regular rate and rhythm, S1, S2 normal, no murmur, click, rub or gallop GI: soft, non-tender; bowel sounds normal; no masses,  no organomegaly Extremities: extremities normal, atraumatic, no cyanosis or edema Skin: Skin color, texture, turgor normal. No rashes or lesions Neurologic: Grossly normal  Lab Results: Recent Labs    12/04/20 0813  WBC 10.9*  HGB 7.6*  HCT 22.2*  PLT 333   BMET No results for input(s): NA, K, CL, CO2, GLUCOSE, BUN, CREATININE, CALCIUM in the last 72 hours.  Studies/Results: No results found.  Medications: I have reviewed the patient's current medications.  Assessment/Plan: 35 year old female admitted with sickle cell painful crisis.  #1 sickle cell painful crisis, slowly improving but not at goal.  Patient will be maintained on current regimen.  Titration of the PCA.  Completed 5 days of Toradol.  Continue oxycodone.  #2 anemia of  chronic disease: Continue monitoring H&H.  So far appears to be close to baseline.  #3 chronic pain syndrome: Continue the Roxicodone.    LOS: 4 days   Savhanna Sliva,LAWAL 12/05/2020, 10:05 PM

## 2020-12-06 LAB — COMPREHENSIVE METABOLIC PANEL
ALT: 20 U/L (ref 0–44)
AST: 41 U/L (ref 15–41)
Albumin: 3.7 g/dL (ref 3.5–5.0)
Alkaline Phosphatase: 59 U/L (ref 38–126)
Anion gap: 9 (ref 5–15)
BUN: 5 mg/dL — ABNORMAL LOW (ref 6–20)
CO2: 26 mmol/L (ref 22–32)
Calcium: 8.8 mg/dL — ABNORMAL LOW (ref 8.9–10.3)
Chloride: 104 mmol/L (ref 98–111)
Creatinine, Ser: 0.5 mg/dL (ref 0.44–1.00)
GFR, Estimated: >60 mL/min (ref >60)
Glucose, Bld: 94 mg/dL (ref 70–99)
Potassium: 4.4 mmol/L (ref 3.5–5.1)
Sodium: 139 mmol/L (ref 135–145)
Total Bilirubin: 3.7 mg/dL — ABNORMAL HIGH (ref 0.3–1.2)
Total Protein: 6.8 g/dL (ref 6.5–8.1)

## 2020-12-06 LAB — CBC WITH DIFFERENTIAL/PLATELET
Abs Immature Granulocytes: 0.09 10*3/uL — ABNORMAL HIGH (ref 0.00–0.07)
Basophils Absolute: 0.1 10*3/uL (ref 0.0–0.1)
Basophils Relative: 1 %
Eosinophils Absolute: 0.4 10*3/uL (ref 0.0–0.5)
Eosinophils Relative: 3 %
HCT: 18.4 % — ABNORMAL LOW (ref 36.0–46.0)
Hemoglobin: 6.5 g/dL — CL (ref 12.0–15.0)
Immature Granulocytes: 1 %
Lymphocytes Relative: 52 %
Lymphs Abs: 6.2 10*3/uL — ABNORMAL HIGH (ref 0.7–4.0)
MCH: 33.7 pg (ref 26.0–34.0)
MCHC: 35.3 g/dL (ref 30.0–36.0)
MCV: 95.3 fL (ref 80.0–100.0)
Monocytes Absolute: 1.2 10*3/uL — ABNORMAL HIGH (ref 0.1–1.0)
Monocytes Relative: 10 %
Neutro Abs: 3.8 10*3/uL (ref 1.7–7.7)
Neutrophils Relative %: 33 %
Platelets: 283 10*3/uL (ref 150–400)
RBC: 1.93 MIL/uL — ABNORMAL LOW (ref 3.87–5.11)
RDW: 21.9 % — ABNORMAL HIGH (ref 11.5–15.5)
WBC: 11.7 10*3/uL — ABNORMAL HIGH (ref 4.0–10.5)
nRBC: 1 % — ABNORMAL HIGH (ref 0.0–0.2)

## 2020-12-06 LAB — PREPARE RBC (CROSSMATCH)

## 2020-12-06 MED ORDER — SODIUM CHLORIDE 0.9% IV SOLUTION: Freq: Once | INTRAVENOUS | Status: AC

## 2020-12-06 NOTE — Progress Notes (Signed)
CRITICAL VALUE ALERT  Critical Value:  Hgb 6.5  Date & Time Notified: 12/06/20 2060  Provider Notified: will notify sickle cell team on rounds  Orders Received/Actions taken: tba

## 2020-12-06 NOTE — Progress Notes (Signed)
Patient ID: Kara [name], female   DOB: [birth year], 35 y.o.   MRN: [id number] Subjective: Kara [name] is a 35 year old female with a medical history significant for sickle cell disease, chronic pain syndrome, opiate dependence and tolerance, and history of anemia of chronic disease that was admitted with sickle cell pain crisis.  Patient has no new complaints today. But she continues to endorse significant pain in her lower back and lower extremities. She rates her pain at 6-7/10 this morning.  She however denies any fever, headache, dizziness, cough, chest pain, shortness of breath, nausea, vomiting or diarrhea. Patient hemoglobin has dropped significantly since admission but with no significant symptoms.  Objective:  Vital signs in last 24 hours:  Vitals:   12/06/20 0400 12/06/20 0538 12/06/20 0829 12/06/20 1034  BP:  (!) 97/58  (!) 100/59  Pulse:  77  86  Resp: 15 15 15 14   Temp:  99.2 F (37.3 C)  98.8 F (37.1 C)  TempSrc:  Oral  Oral  SpO2: 97% 97% 97% 92%  Weight:      Height:        Intake/Output from previous day:   Intake/Output Summary (Last 24 hours) at 12/06/2020 1244 Last data filed at 12/06/2020 0647 Gross per 24 hour  Intake 240 ml  Output --  Net 240 ml    Physical Exam: General: Alert, awake, oriented x3, in no acute distress.  HEENT: [place]/AT PEERL, EOMI Neck: Trachea midline,  no masses, no thyromegal,y no JVD, no carotid bruit OROPHARYNX:  Moist, No exudate/ erythema/lesions.  Heart: Regular rate and rhythm, without murmurs, rubs, gallops, PMI non-displaced, no heaves or thrills on palpation.  Lungs: Clear to auscultation, no wheezing or rhonchi noted. No increased vocal fremitus resonant to percussion  Abdomen: Soft, nontender, nondistended, positive bowel sounds, no masses no hepatosplenomegaly noted..  Neuro: No focal neurological deficits noted cranial nerves II through XII grossly intact. DTRs 2+ bilaterally upper and lower extremities. Strength  5 out of 5 in bilateral upper and lower extremities. Musculoskeletal: No warm swelling or erythema around joints, no spinal tenderness noted. Psychiatric: Patient alert and oriented x3, good insight and cognition, good recent to remote recall. Lymph node survey: No cervical axillary or inguinal lymphadenopathy noted.  Lab Results:  Basic Metabolic Panel:    Component Value Date/Time   NA 139 12/06/2020 0555   NA 138 07/14/2019 1128   K 4.4 12/06/2020 0555   CL 104 12/06/2020 0555   CO2 26 12/06/2020 0555   BUN 5 (L) 12/06/2020 0555   BUN 7 07/14/2019 1128   CREATININE 0.50 12/06/2020 0555   GLUCOSE 94 12/06/2020 0555   CALCIUM 8.8 (L) 12/06/2020 0555   CBC:    Component Value Date/Time   WBC 11.7 (H) 12/06/2020 0555   HGB 6.5 (LL) 12/06/2020 0555   HGB 9.1 (L) 07/14/2019 1128   HCT 18.4 (L) 12/06/2020 0555   HCT 27.8 (L) 07/14/2019 1128   PLT 283 12/06/2020 0555   PLT 435 07/14/2019 1128   MCV 95.3 12/06/2020 0555   MCV 103 (H) 07/14/2019 1128   NEUTROABS 3.8 12/06/2020 0555   NEUTROABS 5.0 07/14/2019 1128   LYMPHSABS 6.2 (H) 12/06/2020 0555   LYMPHSABS 7.3 (H) 07/14/2019 1128   MONOABS 1.2 (H) 12/06/2020 0555   EOSABS 0.4 12/06/2020 0555   EOSABS 0.3 07/14/2019 1128   BASOSABS 0.1 12/06/2020 0555   BASOSABS 0.1 07/14/2019 1128    Recent Results (from the past 240 hour(s))  Resp Panel  by RT PCR (RSV, Flu A&B, Covid) -     Status: None   Collection Time: 12/01/20  5:28 PM  Result Value Ref Range Status   SARS Coronavirus 2 by RT PCR NEGATIVE NEGATIVE Final   Influenza A by PCR NEGATIVE NEGATIVE Final   Influenza B by PCR NEGATIVE NEGATIVE Final   Respiratory Syncytial Virus by PCR NEGATIVE NEGATIVE Final    Comment: Performed at [facility], [address], [place], [place] [address]  Urine culture     Status: Abnormal   Collection Time: 12/01/20  8:45 PM   Specimen: Urine, Random  Result Value Ref Range Status   Specimen Description   Final     URINE, RANDOM Performed at [facility], [address] [place], [place] [address]    Special Requests   Final    NONE Performed at [facility], [address] [place], [place] [address]    Culture (A)  Final    <10,000 COLONIES/mL INSIGNIFICANT GROWTH Performed at [facility], [address] [place], [place] [address]    Report Status 12/03/2020 FINAL  Final    Studies/Results: No results found.  Medications: Scheduled Meds: . sodium chloride   Intravenous Once  . enoxaparin (LOVENOX) injection  40 mg Subcutaneous Q24H  . feeding supplement  237 mL Oral BID BM  . folic acid  1 mg Oral Daily  . HYDROmorphone   Intravenous Q4H  . ketorolac  15 mg Intravenous Q6H  . pantoprazole  40 mg Oral Daily  . senna-docusate  1 tablet Oral BID   Continuous Infusions: . sodium chloride 10 mL/hr at 12/02/20 1322   PRN Meds:.diphenhydrAMINE, naloxone **AND** sodium chloride flush, ondansetron (ZOFRAN) IV, oxyCODONE, polyethylene glycol  Consultants:  None  Procedures:  None  Antibiotics:  None  Assessment/Plan: Principal Problem:   Sickle cell pain crisis (HCC) Active Problems:   Leukocytosis   Chronic pain syndrome   Anxiety   Chronic, continuous use of opioids  1. Hb Sickle Cell Disease with crisis: Continue IVF at [facility], continue weight based Dilaudid PCA at current setting, continue IV Toradol 15 mg Q 6 H for total of 5 days, continue oral home pain medications for breakthrough pain, monitor vitals very closely, Re-evaluate pain scale regularly, 2 L of Oxygen by . 2. Leukocytosis: Very mild and stable since admission, no fever or any other symptoms or signs of infection or inflammation.  We will continue to monitor closely.  No clinical indication for antibiotics at this time. 3. Sickle Cell Anemia: Hemoglobin has dropped to 6.5 which is more than 2 points drop since admission, will transfuse patient with 1 unit of  packed red blood cells today.  We will repeat labs in a.m. 4. Chronic pain Syndrome: Continue home pain medications as ordered.  Code Status: Full Code Family Communication: N/A Disposition Plan: Not yet ready for discharge  Ivin Rosenbloom  If 7PM-7AM, please contact night-coverage.  12/06/2020, 12:44 PM  LOS: 5 days

## 2020-12-07 ENCOUNTER — Encounter: Payer: Self-pay | Admitting: Family Medicine

## 2020-12-07 DIAGNOSIS — D5700 Hb-SS disease with crisis, unspecified: Secondary | ICD-10-CM | POA: Diagnosis not present

## 2020-12-07 LAB — CBC WITH DIFFERENTIAL/PLATELET
Abs Immature Granulocytes: 0.06 10*3/uL (ref 0.00–0.07)
Basophils Absolute: 0.1 10*3/uL (ref 0.0–0.1)
Basophils Relative: 1 %
Eosinophils Absolute: 0.4 10*3/uL (ref 0.0–0.5)
Eosinophils Relative: 4 %
HCT: 22.5 % — ABNORMAL LOW (ref 36.0–46.0)
Hemoglobin: 7.9 g/dL — ABNORMAL LOW (ref 12.0–15.0)
Immature Granulocytes: 1 %
Lymphocytes Relative: 61 %
Lymphs Abs: 6.6 10*3/uL — ABNORMAL HIGH (ref 0.7–4.0)
MCH: 32 pg (ref 26.0–34.0)
MCHC: 35.1 g/dL (ref 30.0–36.0)
MCV: 91.1 fL (ref 80.0–100.0)
Monocytes Absolute: 1.1 10*3/uL — ABNORMAL HIGH (ref 0.1–1.0)
Monocytes Relative: 10 %
Neutro Abs: 2.5 10*3/uL (ref 1.7–7.7)
Neutrophils Relative %: 23 %
Platelets: 287 10*3/uL (ref 150–400)
RBC: 2.47 MIL/uL — ABNORMAL LOW (ref 3.87–5.11)
RDW: 21.7 % — ABNORMAL HIGH (ref 11.5–15.5)
WBC: 10.7 10*3/uL — ABNORMAL HIGH (ref 4.0–10.5)
nRBC: 0.9 % — ABNORMAL HIGH (ref 0.0–0.2)

## 2020-12-07 LAB — BPAM RBC
Blood Product Expiration Date: 202202072359
ISSUE DATE / TIME: 202201171355
Unit Type and Rh: 9500

## 2020-12-07 LAB — TYPE AND SCREEN
ABO/RH(D): A POS
Antibody Screen: NEGATIVE
Donor AG Type: NEGATIVE
Unit division: 0

## 2020-12-07 MED ORDER — OXYCODONE HCL ER 10 MG PO T12A
10.0000 mg | EXTENDED_RELEASE_TABLET | Freq: Two times a day (BID) | ORAL | Status: DC
  Administered 2020-12-07: 10 mg via ORAL
  Filled 2020-12-07: qty 1

## 2020-12-07 MED ORDER — OXYCODONE HCL 5 MG PO TABS
15.0000 mg | ORAL_TABLET | ORAL | Status: DC
  Administered 2020-12-07: 15 mg via ORAL
  Filled 2020-12-07: qty 3

## 2020-12-07 MED ORDER — HYDROMORPHONE 1 MG/ML IV SOLN: INTRAVENOUS | Status: DC

## 2020-12-07 MED ORDER — HYDROMORPHONE HCL 2 MG/ML IJ SOLN: 2.0000 mg | INTRAMUSCULAR | Status: DC | PRN

## 2020-12-07 NOTE — Care Management Important Message (Signed)
Important Message  Patient Details IM Letter given to the Patient. Name: Lachanda Buczek MRN: [id number] Date of Birth: 1986-07-08   Medicare Important Message Given:  Yes     Caren Macadam 12/07/2020, 10:47 AM

## 2020-12-07 NOTE — Progress Notes (Signed)
Patient given all discharge instructions. Patient states that she is still in some pain, but understands when to call the doctor. For her pain, and symptoms if hemoglobin were to drop.   Patient's IV taken out. Patient's neighbor came up to the floor and nurse tech wheeled patient down to private vehicle.

## 2020-12-07 NOTE — Progress Notes (Signed)
Nutrition Follow-up  INTERVENTION:   -Ensure Enlive po BID, each supplement provides 350 kcal and 20 grams of protein  NUTRITION DIAGNOSIS:   Inadequate oral intake related to poor appetite as evidenced by per patient/family report.  Improving.  GOAL:   Patient will meet greater than or equal to 90% of their needs  Progressing.  MONITOR:   PO intake,Supplement acceptance,Labs,Weight trends,I & O's  ASSESSMENT:   35 y.o. female with a medical history significant for sickle cell disease, chronic pain syndrome, opiate dependence and tolerance, history of anemia of chronic disease, and history of generalized anxiety disorder presented to the infusion clinic with complaints of generalized pain that is consistent with previous sickle cell pain crisis.  Patient consumed 100% of breakfast this morning. Accepted an Ensure today as well after declining for days.   Admission weight: 120 lbs. Last recorded weight 1/16: 129 lbs.  I/Os: +3.5L since admit  Labs reviewed. Medications: Folic acid, Senokot  Diet Order:   Diet Order            Diet regular Room service appropriate? Yes  Diet effective now                 EDUCATION NEEDS:   No education needs have been identified at this time  Skin:  Skin Assessment: Reviewed RN Assessment  Last BM:  1/17  Height:   Ht Readings from Last 1 Encounters:  12/03/20 5\' 3"  (1.6 m)    Weight:   Wt Readings from Last 1 Encounters:  12/05/20 58.8 kg   BMI:  Body mass index is 22.96 kg/m.  Estimated Nutritional Needs:   Kcal:  1450-1650  Protein:  65-75g  Fluid:  1.6L/day  12/07/20, MS, RD, LDN Inpatient Clinical Dietitian Contact information available via Amion

## 2020-12-07 NOTE — Discharge Summary (Signed)
Physician Discharge Summary  Kara [name] VQM:[id number] DOB: [birth year] DOA: 12/01/2020  PCP: [name], FNP  Admit date: 12/01/2020  Discharge date: 12/07/2020  Discharge Diagnoses:  Principal Problem:   Sickle cell pain crisis (HCC) Active Problems:   Leukocytosis   Chronic pain syndrome   Anxiety   Chronic, continuous use of opioids   Discharge Condition: Stable  Disposition:   Follow-up Information    [name], FNP Follow up.   Specialty: Family Medicine Contact information: [address] [place] [address] [phone]              Pt is discharged home in good condition and is to follow up with [name], FNP this week to have labs evaluated. Kara [name] is instructed to increase activity slowly and balance with rest for the next few days, and use prescribed medication to complete treatment of pain  Diet: Regular Wt Readings from Last 3 Encounters:  12/05/20 58.8 kg  12/01/20 54.4 kg  10/04/20 54.4 kg    History of present illness:  Kara [name]  is a 35 y.o. female with a medical history significant for sickle cell disease, chronic pain syndrome, opiate dependence and tolerance, history of anemia of chronic disease, and history of generalized anxiety disorder presented to the infusion clinic with complaints of generalized pain that is consistent with previous sickle cell pain crisis.  Patient states the pain intensity has been elevated over the past week and unrelieved by home medications.  She attributes pain crisis to starting a new online exercise routine.  Patient consistently exercise 4 days in a row and states that her lower extremities started to swell on the fourth day of exercise.  She states that she has been taking oxycodone consistently without sustained relief.  Current pain intensity is 9/10 characterized as constant and aching.  Patient states that she has increased pain to feet bilaterally.  She denies  any recent falls or injuries.  No fever, chills, urinary symptoms, nausea, vomiting, or diarrhea.  No chest pain or shortness of breath.  No sick contacts, recent travel, or known exposure to COVID-19 infection.  Sickle cell day clinic course: Vital signs show: Temperature 98.1 F, blood pressure 108/61, pulse 94, respirations 16, and oxygen saturation 99% on RA.  Complete blood count shows WBC 16.5, hemoglobin 8.8, and platelets 375,000.  Complete metabolic panel show: AST 53 and total bilirubin elevated at 4.7.  SARS COVID-19 pending.  Pain persists despite IV Dilaudid PCA, IV fluids, IV Toradol, and Tylenol.  Patient admitted to MedSurg for further management acute sickle cell pain crisis  Hospital Course:  Sickle cell disease with pain crisis:  Patient was admitted for sickle cell pain crisis and managed appropriately with IVF, IV Dilaudid via PCA and IV Toradol, as well as other adjunct therapies per sickle cell pain management protocols. Patient had significant generalized pain that was slow to respond to pain medication regimen. Pain intensity is 6/10 and she is requesting discharge home. Patient will resume home medications and follow-up with PCP for medication management.  Sickle cell anemia: Hemoglobin decreased to 6.5, which is below patient's baseline. She is status post 1 unit PRBCs on 12/06/20. Hemoglobin 7.9 prior to discharge. Patient advised to follow-up with PCP to repeat CBC with differential in 1 week. Patient is alert, oriented, and ambulating without assistance. She is afebrile and oxygen saturation is 100% on RA. Patient was therefore discharged home today in a hemodynamically stable condition.  Kara [name] will follow-up with PCP within 1 week of this discharge. Kara [name] was counseled extensively about nonpharmacologic means of pain management, patient verbalized understanding and was appreciative of  the care received during this admission.   We discussed the need for good  hydration, monitoring of hydration status, avoidance of heat, cold, stress, and infection triggers. We discussed the need to be adherent with taking home medications. Patient was reminded of the need to seek medical attention immediately if any symptom of bleeding, anemia, or infection occurs.  Discharge Exam: Vitals:   12/07/20 0710 12/07/20 0946  BP:  (!) 107/53  Pulse:  75  Resp: 16 14  Temp:  98.9 F (37.2 C)  SpO2: 98% 100%   Vitals:   12/07/20 0331 12/07/20 0604 12/07/20 0710 12/07/20 0946  BP:  (!) 111/56  (!) 107/53  Pulse:  65  75  Resp: 12 14 16 14   Temp:  98.6 F (37 C)  98.9 F (37.2 C)  TempSrc:  Oral  Oral  SpO2: 100% 97% 98% 100%  Weight:      Height:        General appearance : Awake, alert, not in any distress. Speech Clear. Not toxic looking HEENT: Atraumatic and Normocephalic, pupils equally reactive to light and accomodation Neck: Supple, no JVD. No cervical lymphadenopathy.  Chest: Good air entry bilaterally, no added sounds  CVS: S1 S2 regular, no murmurs.  Abdomen: Bowel sounds present, Non tender and not distended with no gaurding, rigidity or rebound. Extremities: B/L Lower Ext shows no edema, both legs are warm to touch Neurology: Awake alert, and oriented X 3, CN II-XII intact, Non focal Skin: No Rash  Discharge Instructions  Discharge Instructions    Discharge patient   Complete by: As directed    Discharge disposition: 01-Home or Self Care   Discharge patient date: 12/07/2020     Allergies as of 12/07/2020      Reactions   Kiwi Extract Hives   Morphine And Related Hives   Paroxetine Hives      Medication List    TAKE these medications   Benzocaine 20 % Oint Apply 1 application topically 4 (four) times daily as needed.   butalbital-acetaminophen-caffeine 50-325-40 MG tablet Commonly known as: FIORICET Take 1 tablet by mouth every 6 (six) hours as needed for headache.   cetirizine 10 MG tablet Commonly known as: ZYRTEC Take 1  tablet (10 mg total) by mouth daily. What changed:   when to take this  reasons to take this   Ensure Plus Liqd Take 237 mLs by mouth 3 (three) times daily between meals. What changed:   when to take this  reasons to take this   folic acid 1 MG tablet Commonly known as: FOLVITE Take 1 tablet (1 mg total) by mouth daily.   hydrOXYzine 10 MG tablet Commonly known as: ATARAX/VISTARIL Take 10 mg by mouth in the morning and at bedtime.   methocarbamol 500 MG tablet Commonly known as: ROBAXIN Take 1 tablet (500 mg total) by mouth in the morning and at bedtime. What changed: when to take this   naproxen 500 MG tablet Commonly known as: NAPROSYN Take 1 tablet (500 mg total) by mouth 2 (two) times daily with a meal.   omeprazole 40 MG capsule Commonly known as: PRILOSEC Take 1 capsule (40 mg total) by mouth daily.   oxyCODONE 15 MG immediate release tablet Commonly known as: ROXICODONE Take 1 tablet (15 mg total) by mouth every 4 (four) hours  as needed for up to 15 days.   Vitamin D (Ergocalciferol) 1.25 MG (50000 UNIT) Caps capsule Commonly known as: DRISDOL Take 1 capsule (50,000 Units total) by mouth every 7 (seven) days. What changed: additional instructions       The results of significant diagnostics from this hospitalization (including imaging, microbiology, ancillary and laboratory) are listed below for reference.    Significant Diagnostic Studies: No results found.  Microbiology: Recent Results (from the past 240 hour(s))  Resp Panel by RT PCR (RSV, Flu A&B, Covid) -     Status: None   Collection Time: 12/01/20  5:28 PM  Result Value Ref Range Status   SARS Coronavirus 2 by RT PCR NEGATIVE NEGATIVE Final   Influenza A by PCR NEGATIVE NEGATIVE Final   Influenza B by PCR NEGATIVE NEGATIVE Final   Respiratory Syncytial Virus by PCR NEGATIVE NEGATIVE Final    Comment: Performed at [facility], [address], [place], [place] [address]   Urine culture     Status: Abnormal   Collection Time: 12/01/20  8:45 PM   Specimen: Urine, Random  Result Value Ref Range Status   Specimen Description   Final    URINE, RANDOM Performed at [facility], [address], [place], [place] [address]    Special Requests   Final    NONE Performed at [facility], [address] [place], [place] [address]    Culture (A)  Final    <10,000 COLONIES/mL INSIGNIFICANT GROWTH Performed at [facility], [address] [place], [place] [address]    Report Status 12/03/2020 FINAL  Final     Labs: Basic Metabolic Panel: Recent Labs  Lab 12/01/20 1205 12/02/20 0642 12/06/20 0555  NA 138 135 139  K 3.8 4.0 4.4  CL 103 102 104  CO2 26 25 26   GLUCOSE 112* 104* 94  BUN 6 6 5*  CREATININE 0.44 0.42* 0.50  CALCIUM 9.2 8.6* 8.8*   Liver Function Tests: Recent Labs  Lab 12/01/20 1205 12/06/20 0555  AST 53* 41  ALT 30 20  ALKPHOS 73 59  BILITOT 4.7* 3.7*  PROT 8.0 6.8  ALBUMIN 4.4 3.7   No results for input(s): LIPASE, AMYLASE in the last 168 hours. No results for input(s): AMMONIA in the last 168 hours. CBC: Recent Labs  Lab 12/01/20 1205 12/02/20 0642 12/04/20 0813 12/06/20 0555 12/07/20 0559  WBC 16.5* 13.0* 10.9* 11.7* 10.7*  NEUTROABS 6.5  --   --  3.8 2.5  HGB 8.8* 7.7* 7.6* 6.5* 7.9*  HCT 26.1* 22.4* 22.2* 18.4* 22.5*  MCV 100.0 99.6 98.2 95.3 91.1  PLT 375 296 333 283 287   Cardiac Enzymes: No results for input(s): CKTOTAL, CKMB, CKMBINDEX, TROPONINI in the last 168 hours. BNP: Invalid input(s): POCBNP CBG: No results for input(s): GLUCAP in the last 168 hours.  Time coordinating discharge: 35 minutes  Signed:  12/09/20  APRN, MSN, FNP-C Patient Care [facility] [address] [place], [place] [phone]  [facility] 12/07/2020, 3:46 PM

## 2020-12-13 ENCOUNTER — Telehealth (INDEPENDENT_AMBULATORY_CARE_PROVIDER_SITE_OTHER): Payer: Medicare Other | Admitting: Family Medicine

## 2020-12-13 ENCOUNTER — Other Ambulatory Visit: Payer: Self-pay | Admitting: Family Medicine

## 2020-12-13 ENCOUNTER — Telehealth: Payer: Self-pay | Admitting: Family Medicine

## 2020-12-13 ENCOUNTER — Encounter: Payer: Self-pay | Admitting: Family Medicine

## 2020-12-13 DIAGNOSIS — D5700 Hb-SS disease with crisis, unspecified: Secondary | ICD-10-CM

## 2020-12-13 DIAGNOSIS — Z09 Encounter for follow-up examination after completed treatment for conditions other than malignant neoplasm: Secondary | ICD-10-CM

## 2020-12-13 DIAGNOSIS — G8929 Other chronic pain: Secondary | ICD-10-CM

## 2020-12-13 DIAGNOSIS — F1190 Opioid use, unspecified, uncomplicated: Secondary | ICD-10-CM

## 2020-12-13 DIAGNOSIS — D571 Sickle-cell disease without crisis: Secondary | ICD-10-CM

## 2020-12-13 DIAGNOSIS — F419 Anxiety disorder, unspecified: Secondary | ICD-10-CM

## 2020-12-13 MED ORDER — OXYCODONE HCL 15 MG PO TABS
15.0000 mg | ORAL_TABLET | ORAL | 0 refills | Status: DC | PRN
Start: 2020-12-13 — End: 2020-12-13

## 2020-12-13 MED ORDER — OXYCODONE HCL 15 MG PO TABS: 15.0000 mg | ORAL_TABLET | ORAL | 0 refills | Status: DC | PRN

## 2020-12-13 MED ORDER — OXYCODONE HCL ER 10 MG PO T12A: 10.0000 mg | EXTENDED_RELEASE_TABLET | Freq: Three times a day (TID) | ORAL | 0 refills | Status: DC | PRN

## 2020-12-13 NOTE — Telephone Encounter (Signed)
Done

## 2020-12-13 NOTE — Progress Notes (Signed)
Virtual Visit via Telephone Note  I connected with Kara [name] on 12/13/20 at  3:20 PM EST by telephone and verified that I am speaking with the correct person using two identifiers.  Location: Patient: Home Provider: Office   I discussed the limitations, risks, security and privacy concerns of performing an evaluation and management service by telephone and the availability of in person appointments. I also discussed with the patient that there may be a patient responsible charge related to this service. The patient expressed understanding and agreed to proceed.   History of Present Illness:  Patient Active Problem List   Diagnosis Date Noted  . Pain and swelling of left lower extremity 09/01/2020  . Abscess of right buttock 04/17/2020  . 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 Sickle cell anemia with pain (HCC) 12/08/2019  . Decreased appetite 08/28/2019  . Chronic pain of right knee Jul 21, 2019  . Gastroesophageal reflux disease without esophagitis 07/21/19  . Anxiety 2019-07-21  . Death of family member 07/21/19  . Chronic, continuous use of opioids 07-21-19  . Headache, new daily persistent ([facility]) 07/04/2019  . 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 Chronic pain syndrome 12/27/2018  . Anemia of chronic disease 12/27/2018  . Pulmonary hypertension (HCC) 04/04/2018  . Sickle cell disease (HCC) 04/04/2018  . Arthritis 04/04/2018  . Chest pain on breathing   . Leukocytosis 02/06/2018  . Sickle cell crisis (HCC) 02/05/2018  . Sickle cell pain crisis (HCC) 10/29/2017    Current Status: Since her last office visit, she has had a Hospital Admission for Sickle Cell Crisis. She states that she has pain in her legs. She rates her pain today at 5/10. She has not had a hospital visit for Sickle Cell Crisis since 12/01/2020 where she was treated and discharged 12/07/2020. She is currently taking all medications as prescribed and staying well hydrated. She reports  occasional nausea, constipation, dizziness and headaches. She denies fevers, chills, fatigue, recent infections, weight loss, and night sweats. She has not had any visual changes, and falls. No chest pain, heart palpitations, cough and shortness of breath reported. Denies GI problems such as vomiting, diarrhea. She has no reports of blood in stools, dysuria and hematuria. No depression or anxiety. She is taking all medications as prescribed.   Observations/Objective:  Telephone Virtual Visit   Assessment and Plan:  1. Hospital discharge follow-up  2. Sickle cell crisis (HCC) Resolved.  3. Hb-SS disease without crisis [facility]) She is doing well today r/t her chronic pain management. She will continue to take pain medications as prescribed; will continue to avoid extreme heat and cold; will continue to eat a healthy diet and drink at least 64 ounces of water daily; continue stool softener as needed; will avoid colds and flu; will continue to get plenty of sleep and rest; will continue to avoid high stressful situations and remain infection free; will continue Folic Acid 1 mg daily to avoid sickle cell crisis. Continue to follow up with Hematologist as needed.   4. Chronic, continuous use of opioids  5. Other chronic pain  6. Anxiety  7. Follow up She will follow up in 2 months.   No orders of the defined types were placed in this encounter.   No orders of the defined types were placed in this encounter.   Referral Orders  No referral(s) requested today    Raliegh Ip, MSN, ANE, FNP-BC [facility] Medicine/S[facility]le Cell Center [facility] [address]  [address]  [place], [place] [address] [phone] fax   I discussed the assessment and treatment plan with the patient. The patient was provided an opportunity to ask questions and all were answered. The patient agreed with the plan and demonstrated an understanding of the  instructions.   The patient was advised to call back or seek an in-person evaluation if the symptoms worsen or if the condition fails to improve as anticipated.  I provided 20 minutes of non-face-to-face time during this encounter.   [name], FNP

## 2020-12-13 NOTE — Progress Notes (Signed)
Needs refills on medications. 

## 2020-12-24 ENCOUNTER — Other Ambulatory Visit: Payer: Self-pay | Admitting: Family Medicine

## 2020-12-24 ENCOUNTER — Telehealth: Payer: Self-pay | Admitting: Family Medicine

## 2020-12-24 DIAGNOSIS — D571 Sickle-cell disease without crisis: Secondary | ICD-10-CM

## 2020-12-24 DIAGNOSIS — F1190 Opioid use, unspecified, uncomplicated: Secondary | ICD-10-CM

## 2020-12-24 DIAGNOSIS — G8929 Other chronic pain: Secondary | ICD-10-CM

## 2020-12-24 MED ORDER — OXYCODONE HCL 15 MG PO TABS
15.0000 mg | ORAL_TABLET | ORAL | 0 refills | Status: DC | PRN
Start: 2020-12-28 — End: 2021-01-07

## 2020-12-24 NOTE — Telephone Encounter (Signed)
Done

## 2021-01-06 ENCOUNTER — Telehealth: Payer: Self-pay | Admitting: Family Medicine

## 2021-01-06 NOTE — Telephone Encounter (Signed)
Pt request refills:  oxyCODONE (OXYCONTIN) 10 mg 12 hr tablet  oxyCODONE (ROXICODONE) 15 MG immediate release tablet  Pt ph [phone].

## 2021-01-07 ENCOUNTER — Other Ambulatory Visit: Payer: Self-pay | Admitting: Family Medicine

## 2021-01-07 DIAGNOSIS — G8929 Other chronic pain: Secondary | ICD-10-CM

## 2021-01-07 DIAGNOSIS — D571 Sickle-cell disease without crisis: Secondary | ICD-10-CM

## 2021-01-07 DIAGNOSIS — F1190 Opioid use, unspecified, uncomplicated: Secondary | ICD-10-CM

## 2021-01-07 MED ORDER — OXYCODONE HCL ER 10 MG PO T12A: 10.0000 mg | EXTENDED_RELEASE_TABLET | Freq: Three times a day (TID) | ORAL | 0 refills | Status: DC | PRN

## 2021-01-07 MED ORDER — OXYCODONE HCL 15 MG PO TABS: 15.0000 mg | ORAL_TABLET | ORAL | 0 refills | Status: DC | PRN

## 2021-01-18 ENCOUNTER — Encounter: Payer: Self-pay | Admitting: Family Medicine

## 2021-01-20 ENCOUNTER — Other Ambulatory Visit: Payer: Self-pay | Admitting: Nurse Practitioner

## 2021-01-20 DIAGNOSIS — D571 Sickle-cell disease without crisis: Secondary | ICD-10-CM

## 2021-01-20 DIAGNOSIS — G8929 Other chronic pain: Secondary | ICD-10-CM

## 2021-01-20 DIAGNOSIS — F1190 Opioid use, unspecified, uncomplicated: Secondary | ICD-10-CM

## 2021-01-21 ENCOUNTER — Telehealth: Payer: Self-pay

## 2021-01-21 NOTE — Telephone Encounter (Signed)
10 prior author. Oxycotine. 15 mg oxycodone  Med refills

## 2021-01-25 ENCOUNTER — Other Ambulatory Visit: Payer: Self-pay | Admitting: Family Medicine

## 2021-01-25 ENCOUNTER — Encounter: Payer: Self-pay | Admitting: Family Medicine

## 2021-01-25 ENCOUNTER — Telehealth: Payer: Self-pay

## 2021-01-25 DIAGNOSIS — G8929 Other chronic pain: Secondary | ICD-10-CM

## 2021-01-25 DIAGNOSIS — F1190 Opioid use, unspecified, uncomplicated: Secondary | ICD-10-CM

## 2021-01-25 DIAGNOSIS — D571 Sickle-cell disease without crisis: Secondary | ICD-10-CM

## 2021-01-25 MED ORDER — OXYCODONE HCL ER 10 MG PO T12A: 10.0000 mg | EXTENDED_RELEASE_TABLET | Freq: Three times a day (TID) | ORAL | 0 refills | Status: AC | PRN

## 2021-01-25 MED ORDER — OXYCODONE HCL 15 MG PO TABS: 15.0000 mg | ORAL_TABLET | ORAL | 0 refills | Status: DC | PRN

## 2021-01-25 NOTE — Progress Notes (Signed)
(  Key: [id number])Need help? Call us at [phone] Status Sent to [employer] The plan will fax you a determination, typically within 1 to 5 business days.  How do I follow up? Drug oxyCODONE HCl 10MG  tablets Form [facility] Prior Approval Request for Standard Drug Request Form Pharmacy Prior Approval Request for Standard Drug Request Form for [place] Medicaid and [place] Health Choice [phone]

## 2021-01-25 NOTE — Telephone Encounter (Signed)
Med refill due today oxycodone 15 mg  Med refill Oxycontin 10 mg

## 2021-01-25 NOTE — Progress Notes (Signed)
(  Key: [id number])Need help? Call us at [phone] Status Sent to [employer] The plan will fax you a determination, typically within 1 to 5 business days.  How do I follow up? Drug oxyCODONE HCl 15MG  tablets Form [facility] Prior Approval Request for Standard Drug Request Form Pharmacy Prior Approval Request for Standard Drug Request Form for [place] Medicaid and  Health Choice [phone]

## 2021-02-04 ENCOUNTER — Telehealth: Payer: Self-pay

## 2021-02-04 NOTE — Telephone Encounter (Signed)
Med refill Oxycodone 15 mg  Folic acid

## 2021-02-05 ENCOUNTER — Other Ambulatory Visit: Payer: Self-pay | Admitting: Family Medicine

## 2021-02-05 DIAGNOSIS — D571 Sickle-cell disease without crisis: Secondary | ICD-10-CM

## 2021-02-05 DIAGNOSIS — F1190 Opioid use, unspecified, uncomplicated: Secondary | ICD-10-CM

## 2021-02-05 DIAGNOSIS — G8929 Other chronic pain: Secondary | ICD-10-CM

## 2021-02-05 MED ORDER — FOLIC ACID 1 MG PO TABS
1.0000 mg | ORAL_TABLET | Freq: Every day | ORAL | 11 refills | Status: DC
Start: 2021-02-05 — End: 2021-07-05

## 2021-02-05 MED ORDER — OXYCODONE HCL 15 MG PO TABS: 15.0000 mg | ORAL_TABLET | ORAL | 0 refills | Status: DC | PRN

## 2021-02-09 ENCOUNTER — Ambulatory Visit (INDEPENDENT_AMBULATORY_CARE_PROVIDER_SITE_OTHER): Payer: Medicare Other | Admitting: Family Medicine

## 2021-02-09 ENCOUNTER — Other Ambulatory Visit: Payer: Self-pay

## 2021-02-09 ENCOUNTER — Encounter: Payer: Self-pay | Admitting: Family Medicine

## 2021-02-09 VITALS — BP 113/59 | HR 88 | Ht 63.0 in | Wt 123.0 lb

## 2021-02-09 DIAGNOSIS — G8929 Other chronic pain: Secondary | ICD-10-CM | POA: Diagnosis not present

## 2021-02-09 DIAGNOSIS — D571 Sickle-cell disease without crisis: Secondary | ICD-10-CM | POA: Diagnosis not present

## 2021-02-09 DIAGNOSIS — F419 Anxiety disorder, unspecified: Secondary | ICD-10-CM

## 2021-02-09 DIAGNOSIS — Z720 Tobacco use: Secondary | ICD-10-CM | POA: Diagnosis not present

## 2021-02-09 DIAGNOSIS — F1190 Opioid use, unspecified, uncomplicated: Secondary | ICD-10-CM | POA: Diagnosis not present

## 2021-02-09 DIAGNOSIS — Z09 Encounter for follow-up examination after completed treatment for conditions other than malignant neoplasm: Secondary | ICD-10-CM

## 2021-02-09 NOTE — Progress Notes (Signed)
[facility] Internal Medicine and Sickle Cell Care    Established Patient Office Visit  Subjective:  Patient ID: Kara [name], female    DOB: [birth year]  Age: 35 y.o. MRN: [id number]  CC:  Chief Complaint  Patient presents with  . Sickle Cell Anemia    HPI Kara [name] is a 35 year old female who presents for Follow Up today.   Patient Active Problem List   Diagnosis Date Noted  . Pain and swelling of left lower extremity 09/01/2020  . Abscess of right buttock 04/17/2020  . 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 Sickle cell anemia with pain (HCC) 12/08/2019  . Decreased appetite 08/28/2019  . Chronic pain of right knee 07/21/2019  . Gastroesophageal reflux disease without esophagitis Jul 21, 2019  . Anxiety 2019/07/21  . Death of family member 07/21/19  . Chronic, continuous use of opioids 07-21-19  . Headache, new daily persistent ([facility]) 07/04/2019  . 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 Chronic pain syndrome 12/27/2018  . Anemia of chronic disease 12/27/2018  . Pulmonary hypertension (HCC) 04/04/2018  . Sickle cell disease (HCC) 04/04/2018  . Arthritis 04/04/2018  . Chest pain on breathing   . Leukocytosis 02/06/2018  . Sickle cell crisis (HCC) 02/05/2018  . Sickle cell pain crisis (HCC) 10/29/2017   Current Status: Since her last office visit, she is doing well with no complaints. She states that she has chronic pain in her arms and legs. She rates her pain today at 5/10. She has not had a hospital visit for Sickle Cell Crisis since 12/01/2020 where she was treated and discharged 12/07/2020. [facility]. She is currently taking all medications as prescribed and staying well hydrated. She reports occasional nausea, constipation, dizziness and headaches. She last took pain medications this morning. She denies fevers, chills, fatigue, recent infections, weight loss, and night sweats. She has  not had any visual changes, and falls. No chest pain, heart palpitations, cough and shortness of breath reported. Denies GI problems such as vomiting, and diarrhea. She has no reports of blood in stools, dysuria and hematuria. No depression or anxiety today. She is taking all medications as prescribed. She is currently smoking 1/2-1 pack of cigarettes a day. Vape occasionally.   Past Medical History:  Diagnosis Date  . Arthritis   . Chronic migraine   . GERD (gastroesophageal reflux disease)   . Hx of cholecystectomy 2015  . Pulmonary hypertension (HCC)   . Sickle cell anemia (HCC)   . Tendinitis    left elbow    Past Surgical History:  Procedure Laterality Date  . DILATION AND CURETTAGE OF UTERUS    . GALLBLADDER SURGERY      Family History  Adopted: Yes    Social History   Socioeconomic History  . Marital status: Single    Spouse name: Not on file  . Number of children: Not on file  . Years of education: Not on file  . Highest education level: Not on file  Occupational History  . Not on file  Tobacco Use  . Smoking status: Current Every Day Smoker    Packs/day: 0.50  . Smokeless tobacco: Never Used  Vaping Use  . Vaping Use: Never used  Substance and Sexual Activity  . Alcohol use: No  . Drug use: Yes    Types: Marijuana  . Sexual activity: Yes    Birth control/protection: None  Other Topics Concern  . Not  on file  Social History Narrative  . Not on file   Social Determinants of Health   Financial Resource Strain: Not on file  Food Insecurity: Not on file  Transportation Needs: Not on file  Physical Activity: Not on file  Stress: Not on file  Social Connections: Not on file  Intimate Partner Violence: Not on file    Outpatient Medications Prior to Visit  Medication Sig Dispense Refill  . butalbital-acetaminophen-caffeine (FIORICET) 50-325-40 MG tablet Take 1 tablet by mouth every 6 (six) hours as needed for headache. 14 tablet 0  . cetirizine  (ZYRTEC) 10 MG tablet Take 1 tablet (10 mg total) by mouth daily. (Patient taking differently: Take 10 mg by mouth daily as needed for allergies.) 30 tablet 11  . folic acid (FOLVITE) 1 MG tablet Take 1 tablet (1 mg total) by mouth daily. 30 tablet 11  . naproxen (NAPROSYN) 500 MG tablet Take 1 tablet (500 mg total) by mouth 2 (two) times daily with a meal. 180 tablet 3  . omeprazole (PRILOSEC) 40 MG capsule Take 1 capsule (40 mg total) by mouth daily. 30 capsule 11  . oxyCODONE (ROXICODONE) 15 MG immediate release tablet Take 1 tablet (15 mg total) by mouth every 4 (four) hours as needed for up to 15 days. 90 tablet 0  . Vitamin D, Ergocalciferol, (DRISDOL) 1.25 MG (50000 UNIT) CAPS capsule Take 1 capsule (50,000 Units total) by mouth every 7 (seven) days. (Patient taking differently: Take 50,000 Units by mouth every 7 (seven) days. Mondays) 5 capsule 2  . Ensure Plus (ENSURE PLUS) LIQD Take 237 mLs by mouth 3 (three) times daily between meals. (Patient not taking: Reported on 02/09/2021) 237 mL 6  . hydrOXYzine (ATARAX/VISTARIL) 10 MG tablet Take 10 mg by mouth in the morning and at bedtime. (Patient not taking: Reported on 02/09/2021)    . methocarbamol (ROBAXIN) 500 MG tablet Take 1 tablet (500 mg total) by mouth in the morning and at bedtime. (Patient taking differently: Take 500 mg by mouth in the morning, at noon, in the evening, and at bedtime.) 60 tablet 6  . oxyCODONE (OXYCONTIN) 10 mg 12 hr tablet Take 1 tablet (10 mg total) by mouth every 8 (eight) hours as needed. (Patient not taking: Reported on 02/09/2021) 90 tablet 0  . Benzocaine 20 % OINT Apply 1 application topically 4 (four) times daily as needed. 28 g 2   No facility-administered medications prior to visit.    Allergies  Allergen Reactions  . Kiwi Extract Hives  . Morphine And Related Hives  . Paroxetine Hives    ROS Review of Systems  Constitutional: Negative.   HENT: Negative.   Eyes: Negative.   Respiratory: Negative.    Cardiovascular: Negative.   Gastrointestinal: Positive for abdominal distention, constipation (occasional ) and nausea (occasional).  Endocrine: Negative.   Genitourinary: Negative.   Musculoskeletal: Positive for arthralgias (generalized joint pain).  Skin: Negative.   Allergic/Immunologic: Negative.   Neurological: Positive for dizziness (occasional ) and headaches (occasional ).  Hematological: Negative.   Psychiatric/Behavioral: Negative.       Objective:    Physical Exam Vitals and nursing note reviewed.  Constitutional:      Appearance: Normal appearance.  HENT:     Head: Normocephalic and atraumatic.     Nose: Nose normal.     Mouth/Throat:     Mouth: Mucous membranes are moist.     Pharynx: Oropharynx is clear.  Cardiovascular:     Rate and Rhythm: Normal  rate and regular rhythm.     Pulses: Normal pulses.     Heart sounds: Normal heart sounds.  Pulmonary:     Effort: Pulmonary effort is normal.     Breath sounds: Normal breath sounds.  Abdominal:     General: Bowel sounds are normal.     Palpations: Abdomen is soft.  Musculoskeletal:        General: Normal range of motion.     Cervical back: Normal range of motion and neck supple.  Skin:    General: Skin is warm and dry.  Neurological:     General: No focal deficit present.     Mental Status: She is alert and oriented to person, place, and time.  Psychiatric:        Mood and Affect: Mood normal.        Behavior: Behavior normal.        Judgment: Judgment normal.     BP (!) 113/59   Pulse 88   Ht 5\' 3"  (1.6 m)   Wt 123 lb (55.8 kg)   SpO2 92%   BMI 21.79 kg/m  Wt Readings from Last 3 Encounters:  02/09/21 123 lb (55.8 kg)  12/05/20 129 lb 10.1 oz (58.8 kg)  12/01/20 120 lb (54.4 kg)     Health Maintenance Due  Topic Date Due  . Hepatitis C Screening  Never done  . COVID-19 Vaccine (1) Never done  . TETANUS/TDAP  Never done  . PAP SMEAR-Modifier  Never done  . INFLUENZA VACCINE   06/20/2020    There are no preventive care reminders to display for this patient.  No results found for: TSH Lab Results  Component Value Date   WBC 10.7 (H) 12/07/2020   HGB 7.9 (L) 12/07/2020   HCT 22.5 (L) 12/07/2020   MCV 91.1 12/07/2020   PLT 287 12/07/2020   Lab Results  Component Value Date   NA 139 12/06/2020   K 4.4 12/06/2020   CO2 26 12/06/2020   GLUCOSE 94 12/06/2020   BUN 5 (L) 12/06/2020   CREATININE 0.50 12/06/2020   BILITOT 3.7 (H) 12/06/2020   ALKPHOS 59 12/06/2020   AST 41 12/06/2020   ALT 20 12/06/2020   PROT 6.8 12/06/2020   ALBUMIN 3.7 12/06/2020   CALCIUM 8.8 (L) 12/06/2020   ANIONGAP 9 12/06/2020   No results found for: CHOL No results found for: HDL No results found for: LDLCALC No results found for: TRIG No results found for: CHOLHDL No results found for: 12/08/2020    Assessment & Plan:   1. Hb-SS disease without crisis [facility]) She is doing well today r/t her chronic pain management. She will continue to take pain medications as prescribed; will continue to avoid extreme heat and cold; will continue to eat a healthy diet and drink at least 64 ounces of water daily; continue stool softener as needed; will avoid colds and flu; will continue to get plenty of sleep and rest; will continue to avoid high stressful situations and remain infection free; will continue Folic Acid 1 mg daily to avoid sickle cell crisis. Continue to follow up with Hematologist as needed.   2. Chronic, continuous use of opioids  3. Other chronic pain  4. Tobacco use  5. Vapes nicotine containing substance Discussed health concerns with using vapes.   6. Anxiety  7. Follow up She will follow up in 2 months.   No orders of the defined types were placed in this encounter.   No orders  of the defined types were placed in this encounter.   Referral Orders  No referral(s) requested today    Raliegh IpNatalie Anyela Napierkowski, MSN, ANE, FNP-BC [facility] [address]  , [place] [address] [phone] fax   Problem List Items Addressed This Visit      Other   Anxiety   Chronic, continuous use of opioids   Hb-SS disease without crisis (HCC) - Primary    Other Visit Diagnoses    Other chronic pain       Tobacco use       Vapes nicotine containing substance       Follow up          No orders of the defined types were placed in this encounter.   Follow-up: No follow-ups on file.    Kallie LocksNatalie M Jahmere Bramel, FNP

## 2021-02-09 NOTE — Progress Notes (Signed)
(  Key: [id number])  Your information has been submitted to [employer]. To check for an updated outcome later, reopen this PA request from your dashboard.  If [employer] has not responded to your request within 24 hours, contact [employer] at [phone]. If you think there may be a problem with your PA request, use our live chat feature at the bottom right.

## 2021-02-15 ENCOUNTER — Encounter: Payer: Self-pay | Admitting: Family Medicine

## 2021-02-18 ENCOUNTER — Telehealth: Payer: Self-pay

## 2021-02-18 NOTE — Telephone Encounter (Signed)
Med refill Oxycodone 15 mg 

## 2021-02-19 ENCOUNTER — Other Ambulatory Visit: Payer: Self-pay | Admitting: Family Medicine

## 2021-02-19 DIAGNOSIS — G8929 Other chronic pain: Secondary | ICD-10-CM

## 2021-02-19 DIAGNOSIS — F1190 Opioid use, unspecified, uncomplicated: Secondary | ICD-10-CM

## 2021-02-19 DIAGNOSIS — D571 Sickle-cell disease without crisis: Secondary | ICD-10-CM

## 2021-02-19 MED ORDER — OXYCODONE HCL 15 MG PO TABS: 15.0000 mg | ORAL_TABLET | ORAL | 0 refills | Status: DC | PRN

## 2021-03-01 ENCOUNTER — Encounter: Payer: Self-pay | Admitting: Family Medicine

## 2021-03-01 ENCOUNTER — Other Ambulatory Visit: Payer: Self-pay

## 2021-03-01 ENCOUNTER — Ambulatory Visit (INDEPENDENT_AMBULATORY_CARE_PROVIDER_SITE_OTHER): Payer: Medicare Other | Admitting: Family Medicine

## 2021-03-01 VITALS — BP 112/69 | HR 82 | Ht 63.0 in | Wt 118.0 lb

## 2021-03-01 DIAGNOSIS — F1190 Opioid use, unspecified, uncomplicated: Secondary | ICD-10-CM

## 2021-03-01 DIAGNOSIS — D571 Sickle-cell disease without crisis: Secondary | ICD-10-CM

## 2021-03-01 DIAGNOSIS — F3130 Bipolar disorder, current episode depressed, mild or moderate severity, unspecified: Secondary | ICD-10-CM | POA: Insufficient documentation

## 2021-03-01 DIAGNOSIS — G8929 Other chronic pain: Secondary | ICD-10-CM

## 2021-03-01 DIAGNOSIS — F419 Anxiety disorder, unspecified: Secondary | ICD-10-CM

## 2021-03-01 DIAGNOSIS — Z09 Encounter for follow-up examination after completed treatment for conditions other than malignant neoplasm: Secondary | ICD-10-CM

## 2021-03-01 DIAGNOSIS — F4312 Post-traumatic stress disorder, chronic: Secondary | ICD-10-CM | POA: Insufficient documentation

## 2021-03-01 DIAGNOSIS — F449 Dissociative and conversion disorder, unspecified: Secondary | ICD-10-CM | POA: Insufficient documentation

## 2021-03-01 NOTE — Progress Notes (Signed)
[facility]   Established Patient Office Visit  Subjective:  Patient ID: Kara [name], female    DOB: 30-Aug-1986  Age: 35 y.o. MRN: [id number]  CC:  Chief Complaint  Patient presents with  . Sickle Cell Anemia    HPI Kara [name] is a 35 year old female who presents for Follow Up today.   Patient Active Problem List   Diagnosis Date Noted  . Chronic post-traumatic stress disorder (PTSD) 03/01/2021  . 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HCC) 03/01/2021  . 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  . Pain and swelling of left lower extremity 09/01/2020  . Abscess of right buttock 04/17/2020  . 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 Sickle cell anemia with pain (HCC) 12/08/2019  . Decreased appetite 08/28/2019  . Chronic pain of right knee Aug 10, 2019  . Gastroesophageal reflux disease without esophagitis August 10, 2019  . Anxiety 10-Aug-2019  . Death of family member 2019/08/10  . Chronic, continuous use of opioids 08/10/2019  . Headache, new daily persistent ([facility]) 07/04/2019  . 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 Chronic pain syndrome 12/27/2018  . Anemia of chronic disease 12/27/2018  . Pulmonary hypertension (HCC) 04/04/2018  . Sickle cell disease (HCC) 04/04/2018  . Arthritis 04/04/2018  . Chest pain on breathing   . Leukocytosis 02/06/2018  . Sickle cell crisis (HCC) 02/05/2018  . Sickle cell pain crisis (HCC) 10/29/2017  . Other social stressor 04/28/2017  . Mood disorder (HCC) 03/10/2017  . Complex care coordination 06/02/2016  . Tobacco abuse 07/07/2012  . Contraception 12/08/2010   Current Status: Since her last office visit, she is doing well with no complaints. She states that she has chronic pain in her arms and legs. She rates her pain today at 4-5/10. She has not had a hospital visit for Sickle Cell Crisis since 12/01/2020 where she was treated and discharged 12/07/2020. [facility]. She is currently taking all medications as prescribed and staying well hydrated. She reports occasional nausea, constipation, dizziness and headaches. She last took pain medications at this morning. She denies fevers, chills, recent infections, weight loss, and night sweats. She has not had any visual changes, and falls. No chest pain, heart palpitations, cough and shortness of breath reported. Denies GI problems such as vomiting, and diarrhea. She has no reports of blood in stools, dysuria and hematuria. No depression or anxiety reported today.  She is taking all medications as prescribed.   Past Medical History:  Diagnosis Date  . Arthritis   . Chronic migraine   . GERD (gastroesophageal reflux disease)   . Hx of cholecystectomy 2015  . Pulmonary hypertension (HCC)   . Sickle cell anemia (HCC)   . Tendinitis    left elbow    Past Surgical History:  Procedure Laterality Date  . DILATION AND CURETTAGE OF UTERUS    . GALLBLADDER SURGERY      Family History  Adopted: Yes    Social History   Socioeconomic History  . Marital status: Single    Spouse name: Not on file  . Number of children: Not on file  . Years of education: Not on file  . Highest education level: Not on file  Occupational History  . Not on file  Tobacco Use  . Smoking status: Current Every Day Smoker    Packs/day: 0.50  . Smokeless tobacco: Never Used  Vaping Use  . Vaping Use: Never used  Substance and Sexual Activity  . Alcohol use: No  . Drug use: Yes    Types: Marijuana  . Sexual activity: Yes    Birth control/protection: None  Other Topics Concern  . Not on file  Social History Narrative  . Not on file   Social Determinants of Health   Financial Resource Strain: Not on file  Food Insecurity: Not on file  Transportation Needs: Not on file  Physical Activity: Not on file  Stress: Not on file  Social Connections: Not on file  Intimate Partner  Violence: Not on file    Outpatient Medications Prior to Visit  Medication Sig Dispense Refill  . butalbital-acetaminophen-caffeine (FIORICET) 50-325-40 MG tablet Take 1 tablet by mouth every 6 (six) hours as needed for headache. 14 tablet 0  . cetirizine (ZYRTEC) 10 MG tablet Take 1 tablet (10 mg total) by mouth daily. (Patient taking differently: Take 10 mg by mouth daily as needed for allergies.) 30 tablet 11  . Ensure Plus (ENSURE PLUS) LIQD Take 237 mLs by mouth 3 (three) times daily between meals. (Patient not taking: Reported on 02/09/2021) 237 mL 6  . folic acid (FOLVITE) 1 MG tablet Take 1 tablet (1 mg total) by mouth daily. 30 tablet 11  . hydrOXYzine (ATARAX/VISTARIL) 10 MG tablet Take 10 mg by mouth in the morning and at bedtime. (Patient not taking: Reported on 02/09/2021)    . methocarbamol (ROBAXIN) 500 MG tablet Take 1 tablet (500 mg total) by mouth in the morning and at bedtime. (Patient taking differently: Take 500 mg by mouth in the morning, at noon, in the evening, and at bedtime.) 60 tablet 6  . naproxen (NAPROSYN) 500 MG tablet Take 1 tablet (500 mg total) by mouth 2 (two) times daily with a meal. 180 tablet 3  . omeprazole (PRILOSEC) 40 MG capsule Take 1 capsule (40 mg total) by mouth daily. 30 capsule 11  . oxyCODONE (ROXICODONE) 15 MG immediate release tablet Take 1 tablet (15 mg total) by mouth every 4 (four) hours as needed for up to 15 days. 90 tablet 0  . Vitamin D, Ergocalciferol, (DRISDOL) 1.25 MG (50000 UNIT) CAPS capsule Take 1 capsule (50,000 Units total) by mouth every 7 (seven) days. (Patient taking differently: Take 50,000 Units by mouth every 7 (seven) days. Mondays) 5 capsule 2   No facility-administered medications prior to visit.    Allergies  Allergen Reactions  . Kiwi Extract Hives  . Morphine And Related Hives  . Paroxetine Hives    ROS Review of Systems  Constitutional: Negative.   HENT: Negative.   Eyes: Negative.   Respiratory: Negative.    Cardiovascular: Negative.   Gastrointestinal: Positive for constipation (occasional ) and nausea (occasional ).  Endocrine: Negative.   Genitourinary: Negative.   Musculoskeletal: Positive for arthralgias (occasional ).  Skin: Negative.   Allergic/Immunologic: Negative.   Neurological: Positive for dizziness (occasional ) and headaches (occasional ).  Hematological: Negative.   Psychiatric/Behavioral: Negative.     Objective:    Physical Exam Vitals and nursing note reviewed.  Constitutional:      Appearance: Normal appearance.  HENT:     Head: Normocephalic and atraumatic.     Nose: Nose normal.     Mouth/Throat:     Mouth: Mucous membranes are moist.     Pharynx: Oropharynx is clear.  Cardiovascular:     Rate and Rhythm: Normal rate and regular rhythm.     Pulses: Normal pulses.     Heart sounds: Normal  heart sounds.  Pulmonary:     Effort: Pulmonary effort is normal.     Breath sounds: Normal breath sounds.  Abdominal:     General: Abdomen is flat. Bowel sounds are normal.     Palpations: Abdomen is soft.  Musculoskeletal:        General: Normal range of motion.     Cervical back: Normal range of motion and neck supple.  Skin:    General: Skin is warm and dry.  Neurological:     General: No focal deficit present.     Mental Status: She is alert and oriented to person, place, and time.  Psychiatric:        Mood and Affect: Mood normal.        Behavior: Behavior normal.        Thought Content: Thought content normal.        Judgment: Judgment normal.     BP 112/69   Pulse 82   Ht 5\' 3"  (1.6 m)   Wt 118 lb (53.5 kg)   LMP 02/18/2021   SpO2 98%   BMI 20.90 kg/m  Wt Readings from Last 3 Encounters:  03/01/21 118 lb (53.5 kg)  02/09/21 123 lb (55.8 kg)  12/05/20 129 lb 10.1 oz (58.8 kg)     Health Maintenance Due  Topic Date Due  . Hepatitis C Screening  Never done  . COVID-19 Vaccine (1) Never done  . TETANUS/TDAP  Never done  . PAP SMEAR-Modifier   Never done    There are no preventive care reminders to display for this patient.  No results found for: TSH Lab Results  Component Value Date   WBC 10.7 (H) 12/07/2020   HGB 7.9 (L) 12/07/2020   HCT 22.5 (L) 12/07/2020   MCV 91.1 12/07/2020   PLT 287 12/07/2020   Lab Results  Component Value Date   NA 139 12/06/2020   K 4.4 12/06/2020   CO2 26 12/06/2020   GLUCOSE 94 12/06/2020   BUN 5 (L) 12/06/2020   CREATININE 0.50 12/06/2020   BILITOT 3.7 (H) 12/06/2020   ALKPHOS 59 12/06/2020   AST 41 12/06/2020   ALT 20 12/06/2020   PROT 6.8 12/06/2020   ALBUMIN 3.7 12/06/2020   CALCIUM 8.8 (L) 12/06/2020   ANIONGAP 9 12/06/2020   No results found for: CHOL No results found for: HDL No results found for: LDLCALC No results found for: TRIG No results found for: CHOLHDL No results found for: [id number]    Assessment & Plan:   1. Hb-SS disease without crisis [facility](HCC) She is doing well today r/t her chronic pain management. She will continue to take pain medications as prescribed; will continue to avoid extreme heat and cold; will continue to eat a healthy diet and drink at least 64 ounces of water daily; continue stool softener as needed; will avoid colds and flu; will continue to get plenty of sleep and rest; will continue to avoid high stressful situations and remain infection free; will continue Folic Acid 1 mg daily to avoid sickle cell crisis. Continue to follow up with Hematologist as needed.   2. Other chronic pain  3. Chronic, continuous use of opioids  4. Anxiety Stable.  5. Follow up She will follow up as scheduled.   No orders of the defined types were placed in this encounter.  No orders of the defined types were placed in this encounter.  Referral Orders  No referral(s) requested today    Raliegh IpNatalie Dailah Opperman, MSN, ANE,  FNP-BC [facility] [address] [place], [place]  [id number] [phone]- fax    Problem List Items Addressed This Visit      Other   Anxiety   Chronic, continuous use of opioids   Hb-SS disease without crisis (HCC) - Primary    Other Visit Diagnoses    Other chronic pain       Follow up          No orders of the defined types were placed in this encounter.   Follow-up: No follow-ups on file.    [name], FNP

## 2021-03-08 ENCOUNTER — Telehealth: Payer: Self-pay

## 2021-03-08 ENCOUNTER — Other Ambulatory Visit: Payer: Self-pay | Admitting: Family Medicine

## 2021-03-08 DIAGNOSIS — F1190 Opioid use, unspecified, uncomplicated: Secondary | ICD-10-CM

## 2021-03-08 DIAGNOSIS — D571 Sickle-cell disease without crisis: Secondary | ICD-10-CM

## 2021-03-08 DIAGNOSIS — G8929 Other chronic pain: Secondary | ICD-10-CM

## 2021-03-08 MED ORDER — OXYCODONE HCL 15 MG PO TABS: 15.0000 mg | ORAL_TABLET | ORAL | 0 refills | Status: DC | PRN

## 2021-03-08 NOTE — Progress Notes (Signed)
Reviewed PDMP substance reporting system prior to prescribing opiate medications. No inconsistencies noted.   Meds ordered this encounter  Medications  . oxyCODONE (ROXICODONE) 15 MG immediate release tablet    Sig: Take 1 tablet (15 mg total) by mouth every 4 (four) hours as needed for up to 15 days.    Dispense:  90 tablet    Refill:  0    Please do not refill this medication prior to 02/22/2021. Thank you.    Order Specific Question:   Supervising Provider    Answer:   Quentin Angst [[id number]]    Nolon Nations  APRN, MSN, FNP-C [facility] [address] [place], [place] [address] [phone]

## 2021-03-08 NOTE — Telephone Encounter (Signed)
Med refill   Oxycodone 15 mg  Oxy 10 mg

## 2021-03-18 ENCOUNTER — Telehealth: Payer: Self-pay

## 2021-03-18 ENCOUNTER — Other Ambulatory Visit: Payer: Self-pay | Admitting: Family Medicine

## 2021-03-18 DIAGNOSIS — G8929 Other chronic pain: Secondary | ICD-10-CM

## 2021-03-18 DIAGNOSIS — D571 Sickle-cell disease without crisis: Secondary | ICD-10-CM

## 2021-03-18 DIAGNOSIS — F1190 Opioid use, unspecified, uncomplicated: Secondary | ICD-10-CM

## 2021-03-18 MED ORDER — OXYCODONE HCL 15 MG PO TABS
15.0000 mg | ORAL_TABLET | ORAL | 0 refills | Status: DC | PRN
Start: 2021-03-22 — End: 2021-04-01

## 2021-03-18 NOTE — Progress Notes (Signed)
Reviewed PDMP substance reporting system prior to prescribing opiate medications. No inconsistencies noted.   Meds ordered this encounter  Medications  . oxyCODONE (ROXICODONE) 15 MG immediate release tablet    Sig: Take 1 tablet (15 mg total) by mouth every 4 (four) hours as needed for up to 15 days.    Dispense:  90 tablet    Refill:  0    Order Specific Question:   Supervising Provider    Answer:   JEGEDE, OLUGBEMIGA E [[id number]]     Kara [name] Kara Munro  APRN, MSN, FNP-C [facility] [address]  [place], [place] [address] [phone]  

## 2021-03-18 NOTE — Telephone Encounter (Signed)
Med refill Oxycodone 15 mg 

## 2021-04-01 ENCOUNTER — Telehealth: Payer: Self-pay | Admitting: Family Medicine

## 2021-04-01 ENCOUNTER — Other Ambulatory Visit: Payer: Self-pay | Admitting: Family Medicine

## 2021-04-01 DIAGNOSIS — G8929 Other chronic pain: Secondary | ICD-10-CM

## 2021-04-01 DIAGNOSIS — D571 Sickle-cell disease without crisis: Secondary | ICD-10-CM

## 2021-04-01 DIAGNOSIS — F1190 Opioid use, unspecified, uncomplicated: Secondary | ICD-10-CM

## 2021-04-01 MED ORDER — OXYCODONE HCL 15 MG PO TABS: 15.0000 mg | ORAL_TABLET | ORAL | 0 refills | Status: DC | PRN

## 2021-04-01 NOTE — Progress Notes (Signed)
Reviewed PDMP substance reporting system prior to prescribing opiate medications. No inconsistencies noted.   Meds ordered this encounter  Medications  . oxyCODONE (ROXICODONE) 15 MG immediate release tablet    Sig: Take 1 tablet (15 mg total) by mouth every 4 (four) hours as needed for up to 15 days.    Dispense:  90 tablet    Refill:  0    Order Specific Question:   Supervising Provider    Answer:   JEGEDE, OLUGBEMIGA E [[id number]]     Kara Mungin Moore Kellina Dreese  APRN, MSN, FNP-C [facility] [address]  [place], [place] [address] [phone]  

## 2021-04-01 NOTE — Telephone Encounter (Signed)
Done

## 2021-04-12 ENCOUNTER — Encounter (HOSPITAL_COMMUNITY): Payer: Self-pay

## 2021-04-12 ENCOUNTER — Emergency Department (HOSPITAL_COMMUNITY)
Admission: EM | Admit: 2021-04-12 | Discharge: 2021-04-12 | Disposition: A | Discharge status: Home / Self Care | Payer: Medicare Other | Attending: Emergency Medicine | Admitting: Emergency Medicine

## 2021-04-12 ENCOUNTER — Other Ambulatory Visit: Payer: Self-pay

## 2021-04-12 DIAGNOSIS — I10 Essential (primary) hypertension: Secondary | ICD-10-CM | POA: Diagnosis not present

## 2021-04-12 DIAGNOSIS — R21 Rash and other nonspecific skin eruption: Secondary | ICD-10-CM | POA: Diagnosis present

## 2021-04-12 DIAGNOSIS — Z79899 Other long term (current) drug therapy: Secondary | ICD-10-CM | POA: Diagnosis not present

## 2021-04-12 DIAGNOSIS — F17200 Nicotine dependence, unspecified, uncomplicated: Secondary | ICD-10-CM | POA: Insufficient documentation

## 2021-04-12 MED ORDER — METHYLPREDNISOLONE SODIUM SUCC 125 MG IJ SOLR
125.0000 mg | Freq: Once | INTRAMUSCULAR | Status: AC
  Administered 2021-04-12: 125 mg via INTRAVENOUS
  Filled 2021-04-12: qty 2

## 2021-04-12 MED ORDER — FAMOTIDINE IN NACL 20-0.9 MG/50ML-% IV SOLN
20.0000 mg | Freq: Once | INTRAVENOUS | Status: AC
  Administered 2021-04-12: 20 mg via INTRAVENOUS
  Filled 2021-04-12: qty 50

## 2021-04-12 MED ORDER — HYDROMORPHONE HCL 1 MG/ML IJ SOLN
0.5000 mg | Freq: Once | INTRAMUSCULAR | Status: AC
Start: 2021-04-12 — End: 2021-04-12
  Administered 2021-04-12: 0.5 mg via INTRAVENOUS
  Filled 2021-04-12: qty 1

## 2021-04-12 MED ORDER — PREDNISONE 10 MG (21) PO TBPK: ORAL_TABLET | Freq: Every day | ORAL | 0 refills | Status: DC

## 2021-04-12 MED ORDER — DIPHENHYDRAMINE HCL 50 MG/ML IJ SOLN
12.5000 mg | Freq: Once | INTRAMUSCULAR | Status: AC
  Administered 2021-04-12: 12.5 mg via INTRAVENOUS
  Filled 2021-04-12: qty 1

## 2021-04-12 MED ORDER — FAMOTIDINE 20 MG PO TABS: 20.0000 mg | ORAL_TABLET | Freq: Two times a day (BID) | ORAL | 0 refills | Status: DC

## 2021-04-12 NOTE — Discharge Instructions (Addendum)
Take medications beginning tomorrow. Take your home pain medicine as prescribed. Return to the ER if you start to experience lip or tongue swelling, trouble breathing, trouble swallowing or chest pain.

## 2021-04-12 NOTE — ED Notes (Signed)
An After Visit Summary was printed and given to the patient. Discharge instructions given and no further questions at this time.  

## 2021-04-12 NOTE — ED Triage Notes (Signed)
Patient arrived Via POV.   Patient woke up about 7-8am this morning and you noticed hives on face, neck, and chest area. Patient states hives are starting to spread down arms. Patient also reports eye swelling and itching everywhere.   Pt took cetrizine and allegra at 8am this morning. Medication helped her eyes.   Patient reports no new items that could have caused allergic reaction.   A/ox4  Hx. SSC 6/10 pain now.

## 2021-04-12 NOTE — ED Provider Notes (Signed)
[facility]-EMERGENCY DEPT Provider Note   CSN: [id number] Arrival date & time: 04/12/21  1422     History No chief complaint on file.   Kara [name] is a 35 y.o. female with a past medical history of sickle cell anemia, GERD presenting to the ED for possible allergic reaction.  States that she woke up this morning with itchy hives throughout her entire face extending down to her torso.  This has worsened.  She tried taking Allegra and cetirizine with some improvement in her symptoms.  Cannot recall any incident that may have triggered this but she did drink a concoction of apple cider vinegar, baking soda, juice but these are all things she is had before.  Denies any lip or tongue swelling.  States that she has had similar allergic reactions in the past without any known trigger.  Denies any trouble breathing, chest pain, fever.  HPI     Past Medical History:  Diagnosis Date  . Arthritis   . Chronic migraine   . GERD (gastroesophageal reflux disease)   . Hx of cholecystectomy 2015  . Pulmonary hypertension (HCC)   . Sickle cell anemia (HCC)   . Tendinitis    left elbow    Patient Active Problem List   Diagnosis Date Noted  . Chronic post-traumatic stress disorder (PTSD) 03/01/2021  . 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HCC) 03/01/2021  . 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  . Pain and swelling of left lower extremity 09/01/2020  . Abscess of right buttock 04/17/2020  . 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 Sickle cell anemia with pain (HCC) 12/08/2019  . Decreased appetite 08/28/2019  . Chronic pain of right knee 07/23/2019  . Gastroesophageal reflux disease without esophagitis 07/23/19  . Anxiety July 23, 2019  . Death of family member 07/23/2019  . Chronic, continuous use of opioids 2019-07-23  . Headache, new daily persistent (NDPH) 07/04/2019  . 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 Chronic pain syndrome 12/27/2018  . Anemia  of chronic disease 12/27/2018  . Pulmonary hypertension (HCC) 04/04/2018  . Sickle cell disease (HCC) 04/04/2018  . Arthritis 04/04/2018  . Chest pain on breathing   . Leukocytosis 02/06/2018  . Sickle cell crisis (HCC) 02/05/2018  . Sickle cell pain crisis (HCC) 10/29/2017  . Other social stressor 04/28/2017  . Mood disorder (HCC) 03/10/2017  . Complex care coordination 06/02/2016  . Tobacco abuse 07/07/2012  . Contraception 12/08/2010    Past Surgical History:  Procedure Laterality Date  . DILATION AND CURETTAGE OF UTERUS    . GALLBLADDER SURGERY       OB History    Gravida  1   Para      Term      Preterm      AB  1   Living        SAB  1   IAB      Ectopic      Multiple      Live Births              Family History  Adopted: Yes    Social History   Tobacco Use  . Smoking status: Current Every Day Smoker    Packs/day: 0.50  . Smokeless tobacco: Never Used  Vaping Use  . Vaping Use: Never used  Substance Use Topics  . Alcohol use: No  . Drug use: Yes    Types: Marijuana    Home Medications Prior to Admission medications   Medication Sig Start  Date End Date Taking? Authorizing Provider  famotidine (PEPCID) 20 MG tablet Take 1 tablet (20 mg total) by mouth 2 (two) times daily. 04/12/21  Yes Ryonna Cimini, PA-C  predniSONE (STERAPRED UNI-PAK 21 TAB) 10 MG (21) TBPK tablet Take by mouth daily. Take 6 tabs by mouth daily  for 2 days, then 5 tabs for 2 days, then 4 tabs for 2 days, then 3 tabs for 2 days, 2 tabs for 2 days, then 1 tab by mouth daily for 2 days 04/12/21  Yes Niara Bunker, PA-C  butalbital-acetaminophen-caffeine (FIORICET) 50-325-40 MG tablet Take 1 tablet by mouth every 6 (six) hours as needed for headache. 06/11/20   [name], FNP  cetirizine (ZYRTEC) 10 MG tablet Take 1 tablet (10 mg total) by mouth daily. Patient taking differently: Take 10 mg by mouth daily as needed for allergies. 04/14/20   [name], FNP  Ensure  Plus (ENSURE PLUS) LIQD Take 237 mLs by mouth 3 (three) times daily between meals. Patient not taking: Reported on 02/09/2021 08/26/19   [name], FNP  folic acid (FOLVITE) 1 MG tablet Take 1 tablet (1 mg total) by mouth daily. 02/05/21   [name], FNP  hydrOXYzine (ATARAX/VISTARIL) 10 MG tablet Take 10 mg by mouth in the morning and at bedtime. Patient not taking: Reported on 02/09/2021    [provider]  methocarbamol (ROBAXIN) 500 MG tablet Take 1 tablet (500 mg total) by mouth in the morning and at bedtime. Patient taking differently: Take 500 mg by mouth in the morning, at noon, in the evening, and at bedtime. 04/14/20   [name], FNP  naproxen (NAPROSYN) 500 MG tablet Take 1 tablet (500 mg total) by mouth 2 (two) times daily with a meal. 11/27/20   [name], FNP  omeprazole (PRILOSEC) 40 MG capsule Take 1 capsule (40 mg total) by mouth daily. 04/14/20   [name], FNP  oxyCODONE (ROXICODONE) 15 MG immediate release tablet Take 1 tablet (15 mg total) by mouth every 4 (four) hours as needed for up to 15 days. 04/05/21 04/20/21  Massie Maroon, FNP  Vitamin D, Ergocalciferol, (DRISDOL) 1.25 MG (50000 UNIT) CAPS capsule Take 1 capsule (50,000 Units total) by mouth every 7 (seven) days. Patient taking differently: Take 50,000 Units by mouth every 7 (seven) days. Mondays 04/14/20   [name], FNP    Allergies    Kiwi extract, Morphine and related, and Paroxetine  Review of Systems   Review of Systems  Constitutional: Negative for appetite change, chills and fever.  HENT: Negative for ear pain, rhinorrhea, sneezing and sore throat.   Eyes: Negative for photophobia and visual disturbance.  Respiratory: Negative for cough, chest tightness, shortness of breath and wheezing.   Cardiovascular: Negative for chest pain and palpitations.  Gastrointestinal: Negative for abdominal pain, blood in stool, constipation, diarrhea, nausea and vomiting.   Genitourinary: Negative for dysuria, hematuria and urgency.  Musculoskeletal: Negative for myalgias.  Skin: Positive for rash.  Neurological: Negative for dizziness, weakness and light-headedness.    Physical Exam Updated Vital Signs BP 132/88   Pulse 84   Temp 99.4 F (37.4 C) (Oral)   Resp 18   LMP 03/22/2021   SpO2 98%   Physical Exam Vitals and nursing note reviewed.  Constitutional:      General: She is not in acute distress.    Appearance: She is well-developed.     Comments: No lip or tongue swelling.  She  speaking complete sentences without signs of respiratory distress.  No signs of angioedema or anaphylaxis  HENT:     Head: Normocephalic and atraumatic.     Nose: Nose normal.  Eyes:     General: No scleral icterus.       Left eye: No discharge.     Conjunctiva/sclera: Conjunctivae normal.  Cardiovascular:     Rate and Rhythm: Normal rate and regular rhythm.     Heart sounds: Normal heart sounds. No murmur heard. No friction rub. No gallop.   Pulmonary:     Effort: Pulmonary effort is normal. No respiratory distress.     Breath sounds: Normal breath sounds.  Abdominal:     General: Bowel sounds are normal. There is no distension.     Palpations: Abdomen is soft.     Tenderness: There is no abdominal tenderness. There is no guarding.  Musculoskeletal:        General: Normal range of motion.     Cervical back: Normal range of motion and neck supple.  Skin:    General: Skin is warm and dry.     Findings: Rash (Papular rash noted to torso and face.) present.  Neurological:     Mental Status: She is alert.     Motor: No abnormal muscle tone.     Coordination: Coordination normal.     ED Results / Procedures / Treatments   Labs (all labs ordered are listed, but only abnormal results are displayed) Labs Reviewed - No data to display  EKG None  Radiology No results found.  Procedures Procedures   Medications Ordered in ED Medications   methylPREDNISolone sodium succinate (SOLU-MEDROL) 125 mg/2 mL injection 125 mg (125 mg Intravenous Given 04/12/21 1602)  famotidine (PEPCID) IVPB 20 mg premix (0 mg Intravenous Stopped 04/12/21 1633)  diphenhydrAMINE (BENADRYL) injection 12.5 mg (12.5 mg Intravenous Given 04/12/21 1602)  HYDROmorphone (DILAUDID) injection 0.5 mg (0.5 mg Intravenous Given 04/12/21 1654)    ED Course  I have reviewed the triage vital signs and the nursing notes.  Pertinent labs & imaging results that were available during my care of the patient were reviewed by me and considered in my medical decision making (see chart for details).    MDM Rules/Calculators/A&P                          35 year old female presenting to the ED for possible allergic reaction.  Has itchy rash to her face and torso.  Unknown trigger.  No signs of angioedema or anaphylaxis.  Vital signs within normal limits.  Will treat with steroids and antihistamines and reassess.  4:48 PM Patient requesting pain medicine as she states that this is triggering her sickle cell pain.  Denies any shortness of breath.  She reports she is still having itching but I feel that visually her symptoms have improved.  Patient is concerned that her pain will worsen when she starts taking prednisone as this is happened in the past.  She does have pain medicine at home.  Informed her that if her pain does worsen she can come back to the ER for reevaluation and further pain control at the time but at this time it is difficult to anticipate this.  She continues to have no signs of angioedema or anaphylaxis.  I feel that she will benefit from continued steroid therapy as well as antihistamine therapy at home.  Advised to follow-up with her PCP. Return precautions given.  Patient is hemodynamically stable, in NAD, and able to ambulate in the ED. Evaluation does not show pathology that would require ongoing emergent intervention or inpatient treatment. I explained the  diagnosis to the patient. Pain has been managed and has no complaints prior to discharge. Patient is comfortable with above plan and is stable for discharge at this time. All questions were answered prior to disposition. Strict return precautions for returning to the ED were discussed. Encouraged follow up with PCP.   An After Visit Summary was printed and given to the patient.   Portions of this note were generated with Scientist, clinical (histocompatibility and immunogenetics). Dictation errors may occur despite best attempts at proofreading.  Final Clinical Impression(s) / ED Diagnoses Final diagnoses:  Rash and nonspecific skin eruption    Rx / DC Orders ED Discharge Orders         Ordered    predniSONE (STERAPRED UNI-PAK 21 TAB) 10 MG (21) TBPK tablet  Daily        04/12/21 1649    famotidine (PEPCID) 20 MG tablet  2 times daily        04/12/21 1649           Dietrich Pates, PA-C 04/12/21 1734    Benjiman Core, MD 04/13/21 1512

## 2021-04-15 ENCOUNTER — Telehealth: Payer: Self-pay

## 2021-04-15 NOTE — Telephone Encounter (Signed)
MED REFILL   Oxycodone 15 Oxycodone 10 mg

## 2021-04-16 ENCOUNTER — Emergency Department (HOSPITAL_COMMUNITY): Payer: Medicare Other

## 2021-04-16 ENCOUNTER — Other Ambulatory Visit: Payer: Self-pay

## 2021-04-16 ENCOUNTER — Inpatient Hospital Stay (HOSPITAL_COMMUNITY)
Admission: EM | Admit: 2021-04-16 | Discharge: 2021-04-23 | DRG: 812 | Disposition: A | Discharge status: Home / Self Care | Payer: Medicare Other | Attending: Internal Medicine | Admitting: Internal Medicine

## 2021-04-16 DIAGNOSIS — D5700 Hb-SS disease with crisis, unspecified: Secondary | ICD-10-CM | POA: Diagnosis not present

## 2021-04-16 DIAGNOSIS — Q8901 Asplenia (congenital): Secondary | ICD-10-CM

## 2021-04-16 DIAGNOSIS — D72829 Elevated white blood cell count, unspecified: Secondary | ICD-10-CM | POA: Diagnosis present

## 2021-04-16 DIAGNOSIS — D571 Sickle-cell disease without crisis: Secondary | ICD-10-CM

## 2021-04-16 DIAGNOSIS — I272 Pulmonary hypertension, unspecified: Secondary | ICD-10-CM | POA: Diagnosis present

## 2021-04-16 DIAGNOSIS — F1190 Opioid use, unspecified, uncomplicated: Secondary | ICD-10-CM

## 2021-04-16 DIAGNOSIS — D75838 Other thrombocytosis: Secondary | ICD-10-CM | POA: Diagnosis present

## 2021-04-16 DIAGNOSIS — K219 Gastro-esophageal reflux disease without esophagitis: Secondary | ICD-10-CM | POA: Diagnosis present

## 2021-04-16 DIAGNOSIS — F17210 Nicotine dependence, cigarettes, uncomplicated: Secondary | ICD-10-CM | POA: Diagnosis present

## 2021-04-16 DIAGNOSIS — Z20822 Contact with and (suspected) exposure to covid-19: Secondary | ICD-10-CM | POA: Diagnosis present

## 2021-04-16 DIAGNOSIS — G894 Chronic pain syndrome: Secondary | ICD-10-CM | POA: Diagnosis present

## 2021-04-16 DIAGNOSIS — D638 Anemia in other chronic diseases classified elsewhere: Secondary | ICD-10-CM | POA: Diagnosis present

## 2021-04-16 DIAGNOSIS — Z9049 Acquired absence of other specified parts of digestive tract: Secondary | ICD-10-CM

## 2021-04-16 DIAGNOSIS — G8929 Other chronic pain: Secondary | ICD-10-CM

## 2021-04-16 DIAGNOSIS — D72828 Other elevated white blood cell count: Secondary | ICD-10-CM | POA: Diagnosis present

## 2021-04-16 DIAGNOSIS — T380X5A Adverse effect of glucocorticoids and synthetic analogues, initial encounter: Secondary | ICD-10-CM | POA: Diagnosis present

## 2021-04-16 MED ORDER — DIPHENHYDRAMINE HCL 25 MG PO CAPS: 25.0000 mg | ORAL_CAPSULE | ORAL | Status: DC | PRN

## 2021-04-16 MED ORDER — HYDROMORPHONE HCL 2 MG/ML IJ SOLN
2.0000 mg | INTRAMUSCULAR | Status: AC
  Administered 2021-04-17 (×2): 2 mg via INTRAVENOUS
  Filled 2021-04-16 (×2): qty 1

## 2021-04-16 MED ORDER — HYDROMORPHONE HCL 2 MG/ML IJ SOLN
2.0000 mg | INTRAMUSCULAR | Status: AC
Start: 2021-04-17 — End: 2021-04-17
  Administered 2021-04-17: 2 mg via INTRAVENOUS
  Filled 2021-04-16: qty 1

## 2021-04-16 MED ORDER — ONDANSETRON HCL 4 MG/2ML IJ SOLN: 4.0000 mg | INTRAMUSCULAR | Status: DC | PRN

## 2021-04-16 MED ORDER — SODIUM CHLORIDE 0.45 % IV SOLN
INTRAVENOUS | Status: DC
  Administered 2021-04-19: 75 mL via INTRAVENOUS

## 2021-04-16 NOTE — ED Provider Notes (Signed)
[facility]-EMERGENCY DEPT Provider Note   CSN: [id number] Arrival date & time: 04/16/21  2233     History Chief Complaint  Patient presents with  . Sickle Cell Pain Crisis    Kara [name] is a 35 y.o. female.  35 yo F with a chief complaints of sickle cell pain crisis.  Going on for a few days.  Taking her home medications without improvement.  Having some chest discomfort with this.  Pain is typical of her sickle cell.  No fevers no cough no nausea vomiting or diarrhea.  She thinks that her recent steroid use was the cause of her symptoms.  The history is provided by the patient.  Sickle Cell Pain Crisis Location:  Diffuse Severity:  Severe Onset quality:  Gradual Duration:  2 days Similar to previous crisis episodes: yes   Timing:  Constant Progression:  Worsening Chronicity:  Recurrent Context comment:  Steroid use Relieved by:  Nothing Worsened by:  Nothing Ineffective treatments:  None tried Associated symptoms: no chest pain, no congestion, no fever, no headaches, no nausea, no shortness of breath, no vomiting and no wheezing        Past Medical History:  Diagnosis Date  . Arthritis   . Chronic migraine   . GERD (gastroesophageal reflux disease)   . Hx of cholecystectomy 2015  . Pulmonary hypertension (HCC)   . Sickle cell anemia (HCC)   . Tendinitis    left elbow    Patient Active Problem List   Diagnosis Date Noted  . Sickle cell anemia (HCC) 04/17/2021  . Chronic post-traumatic stress disorder (PTSD) 03/01/2021  . 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HCC) 03/01/2021  . 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  . Pain and swelling of left lower extremity 09/01/2020  . Abscess of right buttock 04/17/2020  . 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 Sickle cell anemia with pain (HCC) 12/08/2019  . Decreased appetite 08/28/2019  . Chronic pain of right knee 13-Aug-2019  . Gastroesophageal reflux disease without esophagitis August 13, 2019   . Anxiety August 13, 2019  . Death of family member 13-Aug-2019  . Chronic, continuous use of opioids Aug 13, 2019  . Headache, new daily persistent ([facility]) 07/04/2019  . 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 Chronic pain syndrome 12/27/2018  . Anemia of chronic disease 12/27/2018  . Pulmonary hypertension (HCC) 04/04/2018  . Sickle cell disease (HCC) 04/04/2018  . Arthritis 04/04/2018  . Chest pain on breathing   . Leukocytosis 02/06/2018  . Sickle cell crisis (HCC) 02/05/2018  . Sickle cell pain crisis (HCC) 10/29/2017  . Other social stressor 04/28/2017  . Mood disorder (HCC) 03/10/2017  . Complex care coordination 06/02/2016  . Tobacco abuse 07/07/2012  . Contraception 12/08/2010    Past Surgical History:  Procedure Laterality Date  . DILATION AND CURETTAGE OF UTERUS    . GALLBLADDER SURGERY       OB History    Gravida  1   Para      Term      Preterm      AB  1   Living        SAB  1   IAB      Ectopic      Multiple      Live Births              Family History  Adopted: Yes    Social History   Tobacco Use  . Smoking status: Current Every Day Smoker    Packs/day: 0.50  .  Smokeless tobacco: Never Used  Vaping Use  . Vaping Use: Never used  Substance Use Topics  . Alcohol use: No  . Drug use: Yes    Types: Marijuana    Home Medications Prior to Admission medications   Medication Sig Start Date End Date Taking? Authorizing Provider  butalbital-acetaminophen-caffeine (FIORICET) 50-325-40 MG tablet Take 1 tablet by mouth every 6 (six) hours as needed for headache. 06/11/20   [name], FNP  cetirizine (ZYRTEC) 10 MG tablet Take 1 tablet (10 mg total) by mouth daily. Patient taking differently: Take 10 mg by mouth daily as needed for allergies. 04/14/20   [name], FNP  Ensure Plus (ENSURE PLUS) LIQD Take 237 mLs by mouth 3 (three) times daily between meals. Patient not taking: Reported on 02/09/2021 08/26/19   [name], FNP  famotidine (PEPCID) 20 MG tablet Take 1 tablet (20 mg total) by mouth 2 (two) times daily. 04/12/21   [name], [name], PA-C  folic acid (FOLVITE) 1 MG tablet Take 1 tablet (1 mg total) by mouth daily. 02/05/21   [name], FNP  hydrOXYzine (ATARAX/VISTARIL) 10 MG tablet Take 10 mg by mouth in the morning and at bedtime. Patient not taking: Reported on 02/09/2021    [provider]  methocarbamol (ROBAXIN) 500 MG tablet Take 1 tablet (500 mg total) by mouth in the morning and at bedtime. Patient taking differently: Take 500 mg by mouth in the morning, at noon, in the evening, and at bedtime. 04/14/20   [name], FNP  naproxen (NAPROSYN) 500 MG tablet Take 1 tablet (500 mg total) by mouth 2 (two) times daily with a meal. 11/27/20   [name], FNP  omeprazole (PRILOSEC) 40 MG capsule Take 1 capsule (40 mg total) by mouth daily. 04/14/20   [name], FNP  oxyCODONE (ROXICODONE) 15 MG immediate release tablet Take 1 tablet (15 mg total) by mouth every 4 (four) hours as needed for up to 15 days. 04/05/21 04/20/21  Massie Maroon, FNP  predniSONE (STERAPRED UNI-PAK 21 TAB) 10 MG (21) TBPK tablet Take by mouth daily. Take 6 tabs by mouth daily  for 2 days, then 5 tabs for 2 days, then 4 tabs for 2 days, then 3 tabs for 2 days, 2 tabs for 2 days, then 1 tab by mouth daily for 2 days 04/12/21   Idelle Leech, [name], PA-C  Vitamin D, Ergocalciferol, (DRISDOL) 1.25 MG (50000 UNIT) CAPS capsule Take 1 capsule (50,000 Units total) by mouth every 7 (seven) days. Patient taking differently: Take 50,000 Units by mouth every 7 (seven) days. Mondays 04/14/20   [name], FNP    Allergies    Kiwi extract, Morphine and related, and Paroxetine  Review of Systems   Review of Systems  Constitutional: Negative for chills and fever.  HENT: Negative for congestion and rhinorrhea.   Eyes: Negative for redness and visual disturbance.  Respiratory: Negative for shortness of  breath and wheezing.   Cardiovascular: Negative for chest pain and palpitations.  Gastrointestinal: Negative for nausea and vomiting.  Genitourinary: Negative for dysuria and urgency.  Musculoskeletal: Positive for arthralgias and myalgias.  Skin: Negative for pallor and wound.  Neurological: Negative for dizziness and headaches.    Physical Exam Updated Vital Signs BP 113/69 (BP Location: Left Arm)   Pulse 86   Temp 98.9 F (37.2 C) (Oral)   Resp 16   Ht  (1.6 m)   Wt 54.4 kg  LMP 03/22/2021   SpO2 (!) 82%   BMI 21.26 kg/m   Physical Exam Vitals and nursing note reviewed.  Constitutional:      General: She is not in acute distress.    Appearance: She is well-developed. She is not diaphoretic.  HENT:     Head: Normocephalic and atraumatic.  Eyes:     Pupils: Pupils are equal, round, and reactive to light.  Cardiovascular:     Rate and Rhythm: Normal rate and regular rhythm.     Heart sounds: No murmur heard. No friction rub. No gallop.   Pulmonary:     Effort: Pulmonary effort is normal.     Breath sounds: No wheezing or rales.  Abdominal:     General: There is no distension.     Palpations: Abdomen is soft.     Tenderness: There is no abdominal tenderness.  Musculoskeletal:        General: No tenderness.     Cervical back: Normal range of motion and neck supple.  Skin:    General: Skin is warm and dry.  Neurological:     Mental Status: She is alert and oriented to person, place, and time.  Psychiatric:        Behavior: Behavior normal.     ED Results / Procedures / Treatments   Labs (all labs ordered are listed, but only abnormal results are displayed) Labs Reviewed  CBC WITH DIFFERENTIAL/PLATELET - Abnormal; Notable for the following components:      Result Value   WBC 28.4 (*)    RBC 1.96 (*)    Hemoglobin 6.7 (*)    HCT 19.2 (*)    MCH 34.2 (*)    RDW 19.5 (*)    nRBC 1.4 (*)    Neutro Abs 16.8 (*)    Lymphs Abs 7.3 (*)    Monocytes  Absolute 3.2 (*)    Abs Immature Granulocytes 1.04 (*)    All other components within normal limits  COMPREHENSIVE METABOLIC PANEL - Abnormal; Notable for the following components:   Glucose, Bld 113 (*)    Creatinine, Ser 0.34 (*)    Total Bilirubin 4.3 (*)    All other components within normal limits  RETICULOCYTES - Abnormal; Notable for the following components:   Retic Ct Pct 9.8 (*)    RBC. 1.89 (*)    Immature Retic Fract 39.5 (*)    All other components within normal limits  RESP PANEL BY RT-PCR (FLU A&B, COVID) ARPGX2  CULTURE, BLOOD (ROUTINE X 2)  CULTURE, BLOOD (ROUTINE X 2)  PATHOLOGIST SMEAR REVIEW  CBC WITH DIFFERENTIAL/PLATELET  PROCALCITONIN  URINALYSIS, ROUTINE W REFLEX MICROSCOPIC  I-STAT BETA HCG BLOOD, ED ([facility], [facility], [facility] ONLY)  PREPARE RBC (CROSSMATCH)  TYPE AND SCREEN    EKG None  Radiology DG Chest Port 1 View  Result Date: 04/16/2021 CLINICAL DATA:  Low O2 sat sickle cell crisis EXAM: PORTABLE CHEST 1 VIEW COMPARISON:  08/27/2020 FINDINGS: No focal opacity or pleural effusion. Stable cardiomediastinal silhouette. No pneumothorax. IMPRESSION: No active disease. Electronically Signed   By: Jasmine PangKim  Fujinaga M.D.   On: 04/16/2021 23:52    Procedures Procedures   Medications Ordered in ED Medications  0.45 % sodium chloride infusion ( Intravenous New Bag/Given 04/17/21 0009)  0.9 %  sodium chloride infusion (Manually program via Guardrails IV Fluids) (has no administration in time range)  diphenhydrAMINE (BENADRYL) capsule 25 mg (has no administration in time range)  ondansetron (ZOFRAN) injection 4 mg (has no  administration in time range)  senna-docusate (Senokot-S) tablet 1 tablet (has no administration in time range)  polyethylene glycol (MIRALAX / GLYCOLAX) packet 17 g (has no administration in time range)  enoxaparin (LOVENOX) injection 40 mg (has no administration in time range)  ketorolac (TORADOL) 15 MG/ML injection 15 mg (has no administration in time  range)  naloxone (NARCAN) injection 0.4 mg (has no administration in time range)    And  sodium chloride flush (NS) 0.9 % injection 9 mL (has no administration in time range)  HYDROmorphone (DILAUDID) 1 mg/mL PCA injection (has no administration in time range)  HYDROmorphone (DILAUDID) injection 2 mg (has no administration in time range)  HYDROmorphone (DILAUDID) injection 2 mg (2 mg Intravenous Given 04/17/21 0006)  HYDROmorphone (DILAUDID) injection 2 mg (2 mg Intravenous Given 04/17/21 0153)    ED Course  I have reviewed the triage vital signs and the nursing notes.  Pertinent labs & imaging results that were available during my care of the patient were reviewed by me and considered in my medical decision making (see chart for details).    MDM Rules/Calculators/A&P                          37 yoF with a chief complaints of sickle cell pain crisis.  Going on for a few days now.  Patient has an oxygen saturation of 90 on my initial exam in the room.  No tachypnea.  Will obtain a COVID test.  Chest x-ray.  Blood work treat pain and nausea reassess.  Patient's hemoglobin is below baseline.  We will transfuse a unit of blood.  Discussed with hospitalist for admission.  CRITICAL CARE Performed by: Rae Roam   Total critical care time: 35 minutes  Critical care time was exclusive of separately billable procedures and treating other patients.  Critical care was necessary to treat or prevent imminent or life-threatening deterioration.  Critical care was time spent personally by me on the following activities: development of treatment plan with patient and/or surrogate as well as nursing, discussions with consultants, evaluation of patient's response to treatment, examination of patient, obtaining history from patient or surrogate, ordering and performing treatments and interventions, ordering and review of laboratory studies, ordering and review of radiographic studies, pulse  oximetry and re-evaluation of patient's condition.  The patients results and plan were reviewed and discussed.   Any x-rays performed were independently reviewed by myself.   Differential diagnosis were considered with the presenting HPI.  Medications  0.45 % sodium chloride infusion ( Intravenous New Bag/Given 04/17/21 0009)  0.9 %  sodium chloride infusion (Manually program via Guardrails IV Fluids) (has no administration in time range)  diphenhydrAMINE (BENADRYL) capsule 25 mg (has no administration in time range)  ondansetron (ZOFRAN) injection 4 mg (has no administration in time range)  senna-docusate (Senokot-S) tablet 1 tablet (has no administration in time range)  polyethylene glycol (MIRALAX / GLYCOLAX) packet 17 g (has no administration in time range)  enoxaparin (LOVENOX) injection 40 mg (has no administration in time range)  ketorolac (TORADOL) 15 MG/ML injection 15 mg (has no administration in time range)  naloxone (NARCAN) injection 0.4 mg (has no administration in time range)    And  sodium chloride flush (NS) 0.9 % injection 9 mL (has no administration in time range)  HYDROmorphone (DILAUDID) 1 mg/mL PCA injection (has no administration in time range)  HYDROmorphone (DILAUDID) injection 2 mg (has no administration in time range)  HYDROmorphone (DILAUDID) injection 2 mg (2 mg Intravenous Given 04/17/21 0006)  HYDROmorphone (DILAUDID) injection 2 mg (2 mg Intravenous Given 04/17/21 0153)    Vitals:   04/17/21 0100 04/17/21 0130 04/17/21 0200 04/17/21 0312  BP: (!) 106/59 118/67 106/68 113/69  Pulse: 76 72 66 86  Resp: 18 17 18 16   Temp:    98.9 F (37.2 C)  TempSrc:    Oral  SpO2: 94% 97% 97% (!) 82%  Weight:      Height:        Final diagnoses:  Sickle cell pain crisis (HCC)    Admission/ observation were discussed with the admitting physician, patient and/or family and they are comfortable with the plan.   Final Clinical Impression(s) / ED Diagnoses Final  diagnoses:  Sickle cell pain crisis [facility])    Rx / DC Orders ED Discharge Orders    None       [facility], DO 04/17/21 04/19/21

## 2021-04-16 NOTE — ED Triage Notes (Signed)
Pt came in with c/o SCC. Pt was seen here previously for a rash, and she states the steroids are causing the crisis. Pt RA sats are 92%. Pt states it started last night and has been taking her oxycodone. Pt c/o pain back, legs and knees

## 2021-04-16 NOTE — ED Notes (Signed)
Attempted to start IV x1 w/o success.  Charge RN asked to attempt.

## 2021-04-17 ENCOUNTER — Encounter (HOSPITAL_COMMUNITY): Payer: Self-pay | Admitting: Internal Medicine

## 2021-04-17 DIAGNOSIS — F17210 Nicotine dependence, cigarettes, uncomplicated: Secondary | ICD-10-CM | POA: Diagnosis present

## 2021-04-17 DIAGNOSIS — T380X5A Adverse effect of glucocorticoids and synthetic analogues, initial encounter: Secondary | ICD-10-CM | POA: Diagnosis present

## 2021-04-17 DIAGNOSIS — G894 Chronic pain syndrome: Secondary | ICD-10-CM | POA: Diagnosis present

## 2021-04-17 DIAGNOSIS — D72829 Elevated white blood cell count, unspecified: Secondary | ICD-10-CM

## 2021-04-17 DIAGNOSIS — Q8901 Asplenia (congenital): Secondary | ICD-10-CM | POA: Diagnosis not present

## 2021-04-17 DIAGNOSIS — Z20822 Contact with and (suspected) exposure to covid-19: Secondary | ICD-10-CM | POA: Diagnosis present

## 2021-04-17 DIAGNOSIS — D5700 Hb-SS disease with crisis, unspecified: Principal | ICD-10-CM

## 2021-04-17 DIAGNOSIS — I272 Pulmonary hypertension, unspecified: Secondary | ICD-10-CM | POA: Diagnosis present

## 2021-04-17 DIAGNOSIS — K219 Gastro-esophageal reflux disease without esophagitis: Secondary | ICD-10-CM | POA: Diagnosis present

## 2021-04-17 DIAGNOSIS — D571 Sickle-cell disease without crisis: Secondary | ICD-10-CM

## 2021-04-17 DIAGNOSIS — D75838 Other thrombocytosis: Secondary | ICD-10-CM | POA: Diagnosis present

## 2021-04-17 DIAGNOSIS — D638 Anemia in other chronic diseases classified elsewhere: Secondary | ICD-10-CM | POA: Diagnosis present

## 2021-04-17 DIAGNOSIS — D72828 Other elevated white blood cell count: Secondary | ICD-10-CM | POA: Diagnosis present

## 2021-04-17 DIAGNOSIS — Z9049 Acquired absence of other specified parts of digestive tract: Secondary | ICD-10-CM | POA: Diagnosis not present

## 2021-04-17 LAB — CBC WITH DIFFERENTIAL/PLATELET
Abs Immature Granulocytes: 1.04 10*3/uL — ABNORMAL HIGH (ref 0.00–0.07)
Abs Immature Granulocytes: 1.12 10*3/uL — ABNORMAL HIGH (ref 0.00–0.07)
Basophils Absolute: 0.1 10*3/uL (ref 0.0–0.1)
Basophils Absolute: 0.1 10*3/uL (ref 0.0–0.1)
Basophils Relative: 0 %
Basophils Relative: 0 %
Eosinophils Absolute: 0 10*3/uL (ref 0.0–0.5)
Eosinophils Absolute: 0 10*3/uL (ref 0.0–0.5)
Eosinophils Relative: 0 %
Eosinophils Relative: 0 %
HCT: 19.2 % — ABNORMAL LOW (ref 36.0–46.0)
HCT: 18.5 % — ABNORMAL LOW (ref 36.0–46.0)
Hemoglobin: 6.7 g/dL — CL (ref 12.0–15.0)
Hemoglobin: 6.5 g/dL — CL (ref 12.0–15.0)
Immature Granulocytes: 4 %
Immature Granulocytes: 3 %
Lymphocytes Relative: 26 %
Lymphocytes Relative: 38 %
Lymphs Abs: 7.3 10*3/uL — ABNORMAL HIGH (ref 0.7–4.0)
Lymphs Abs: 14.3 10*3/uL — ABNORMAL HIGH (ref 0.7–4.0)
MCH: 34.2 pg — ABNORMAL HIGH (ref 26.0–34.0)
MCH: 34.6 pg — ABNORMAL HIGH (ref 26.0–34.0)
MCHC: 34.9 g/dL (ref 30.0–36.0)
MCHC: 35.1 g/dL (ref 30.0–36.0)
MCV: 98 fL (ref 80.0–100.0)
MCV: 98.4 fL (ref 80.0–100.0)
Monocytes Absolute: 3.2 10*3/uL — ABNORMAL HIGH (ref 0.1–1.0)
Monocytes Absolute: 4.4 10*3/uL — ABNORMAL HIGH (ref 0.1–1.0)
Monocytes Relative: 11 %
Monocytes Relative: 12 %
Neutro Abs: 16.8 10*3/uL — ABNORMAL HIGH (ref 1.7–7.7)
Neutro Abs: 17.4 10*3/uL — ABNORMAL HIGH (ref 1.7–7.7)
Neutrophils Relative %: 59 %
Neutrophils Relative %: 47 %
Platelets: 307 10*3/uL (ref 150–400)
Platelets: 284 10*3/uL (ref 150–400)
RBC: 1.96 MIL/uL — ABNORMAL LOW (ref 3.87–5.11)
RBC: 1.88 MIL/uL — ABNORMAL LOW (ref 3.87–5.11)
RDW: 19.5 % — ABNORMAL HIGH (ref 11.5–15.5)
RDW: 20.5 % — ABNORMAL HIGH (ref 11.5–15.5)
WBC: 28.4 10*3/uL — ABNORMAL HIGH (ref 4.0–10.5)
WBC: 37.3 10*3/uL — ABNORMAL HIGH (ref 4.0–10.5)
nRBC: 1.4 % — ABNORMAL HIGH (ref 0.0–0.2)
nRBC: 1.6 % — ABNORMAL HIGH (ref 0.0–0.2)

## 2021-04-17 LAB — COMPREHENSIVE METABOLIC PANEL
ALT: 13 U/L (ref 0–44)
AST: 25 U/L (ref 15–41)
Albumin: 4.2 g/dL (ref 3.5–5.0)
Alkaline Phosphatase: 41 U/L (ref 38–126)
Anion gap: 8 (ref 5–15)
BUN: 16 mg/dL (ref 6–20)
CO2: 26 mmol/L (ref 22–32)
Calcium: 9.1 mg/dL (ref 8.9–10.3)
Chloride: 102 mmol/L (ref 98–111)
Creatinine, Ser: 0.34 mg/dL — ABNORMAL LOW (ref 0.44–1.00)
GFR, Estimated: >60 mL/min (ref >60)
Glucose, Bld: 113 mg/dL — ABNORMAL HIGH (ref 70–99)
Potassium: 3.6 mmol/L (ref 3.5–5.1)
Sodium: 136 mmol/L (ref 135–145)
Total Bilirubin: 4.3 mg/dL — ABNORMAL HIGH (ref 0.3–1.2)
Total Protein: 7.8 g/dL (ref 6.5–8.1)

## 2021-04-17 LAB — PREPARE RBC (CROSSMATCH)

## 2021-04-17 LAB — RESP PANEL BY RT-PCR (FLU A&B, COVID) ARPGX2
Influenza A by PCR: NEGATIVE
Influenza B by PCR: NEGATIVE
SARS Coronavirus 2 by RT PCR: NEGATIVE

## 2021-04-17 LAB — I-STAT BETA HCG BLOOD, ED (MC, WL, AP ONLY): I-stat hCG, quantitative: <5 m[IU]/mL (ref <5)

## 2021-04-17 LAB — RETICULOCYTES
Immature Retic Fract: 39.5 % — ABNORMAL HIGH (ref 2.3–15.9)
RBC.: 1.89 MIL/uL — ABNORMAL LOW (ref 3.87–5.11)
Retic Count, Absolute: 185.8 10*3/uL (ref 19.0–186.0)
Retic Ct Pct: 9.8 % — ABNORMAL HIGH (ref 0.4–3.1)

## 2021-04-17 LAB — TROPONIN I (HIGH SENSITIVITY)
Troponin I (High Sensitivity): 3 ng/L (ref <18)
Troponin I (High Sensitivity): 3 ng/L (ref <18)

## 2021-04-17 LAB — PROCALCITONIN: Procalcitonin: 0.11 ng/mL

## 2021-04-17 MED ORDER — ENOXAPARIN SODIUM 40 MG/0.4ML IJ SOSY
40.0000 mg | PREFILLED_SYRINGE | INTRAMUSCULAR | Status: DC
  Administered 2021-04-17: 40 mg via SUBCUTANEOUS
  Filled 2021-04-17 (×4): qty 0.4

## 2021-04-17 MED ORDER — HYDROXYZINE HCL 10 MG PO TABS
10.0000 mg | ORAL_TABLET | Freq: Every morning | ORAL | Status: DC
  Filled 2021-04-17 (×7): qty 1

## 2021-04-17 MED ORDER — METHYLPREDNISOLONE SODIUM SUCC 40 MG IJ SOLR
40.0000 mg | Freq: Two times a day (BID) | INTRAMUSCULAR | Status: AC
  Administered 2021-04-17 – 2021-04-19 (×4): 40 mg via INTRAVENOUS
  Filled 2021-04-17 (×4): qty 1

## 2021-04-17 MED ORDER — NALOXONE HCL 0.4 MG/ML IJ SOLN: 0.4000 mg | INTRAMUSCULAR | Status: DC | PRN

## 2021-04-17 MED ORDER — HYDROMORPHONE 1 MG/ML IV SOLN
INTRAVENOUS | Status: DC
Start: 2021-04-17 — End: 2021-04-17
  Administered 2021-04-17: 2.1 mg via INTRAVENOUS
  Administered 2021-04-17: 2 mg via INTRAVENOUS
  Administered 2021-04-17: 30 mg via INTRAVENOUS
  Filled 2021-04-17: qty 30

## 2021-04-17 MED ORDER — SODIUM CHLORIDE 0.9% FLUSH: 9.0000 mL | INTRAVENOUS | Status: DC | PRN

## 2021-04-17 MED ORDER — ONDANSETRON HCL 4 MG/2ML IJ SOLN: 4.0000 mg | Freq: Four times a day (QID) | INTRAMUSCULAR | Status: DC | PRN

## 2021-04-17 MED ORDER — SODIUM CHLORIDE 0.45 % IV SOLN: INTRAVENOUS | Status: DC

## 2021-04-17 MED ORDER — HYDROXYZINE HCL 10 MG PO TABS
10.0000 mg | ORAL_TABLET | Freq: Every day | ORAL | Status: DC
  Filled 2021-04-17 (×6): qty 1

## 2021-04-17 MED ORDER — HYDROMORPHONE HCL 2 MG/ML IJ SOLN: 2.0000 mg | Freq: Once | INTRAMUSCULAR | Status: DC | PRN

## 2021-04-17 MED ORDER — DIPHENHYDRAMINE HCL 25 MG PO CAPS: 25.0000 mg | ORAL_CAPSULE | Freq: Four times a day (QID) | ORAL | Status: DC | PRN

## 2021-04-17 MED ORDER — SENNOSIDES-DOCUSATE SODIUM 8.6-50 MG PO TABS
1.0000 | ORAL_TABLET | Freq: Two times a day (BID) | ORAL | Status: DC
  Administered 2021-04-17 – 2021-04-19 (×2): 1 via ORAL
  Filled 2021-04-17 (×7): qty 1

## 2021-04-17 MED ORDER — HYDROMORPHONE 1 MG/ML IV SOLN
INTRAVENOUS | Status: DC
  Administered 2021-04-17: 4.5 mg via INTRAVENOUS
  Administered 2021-04-17: 1.5 mg via INTRAVENOUS
  Administered 2021-04-18: 3 mg via INTRAVENOUS
  Administered 2021-04-18: 5.5 mg via INTRAVENOUS
  Administered 2021-04-18: 4 mg via INTRAVENOUS
  Administered 2021-04-18: 3 mg via INTRAVENOUS
  Administered 2021-04-18: 30 mg via INTRAVENOUS
  Administered 2021-04-18: 1 mg via INTRAVENOUS
  Administered 2021-04-19: 2 mg via INTRAVENOUS
  Administered 2021-04-19: 30 mg via INTRAVENOUS
  Administered 2021-04-19: 2 mg via INTRAVENOUS
  Administered 2021-04-19: 4 mg via INTRAVENOUS
  Administered 2021-04-19: 3.5 mg via INTRAVENOUS
  Administered 2021-04-19: 1 mg via INTRAVENOUS
  Administered 2021-04-19: 7.5 mg via INTRAVENOUS
  Administered 2021-04-20: 2.5 mg via INTRAVENOUS
  Administered 2021-04-20: 1 mg via INTRAVENOUS
  Administered 2021-04-20: 5.5 mg via INTRAVENOUS
  Administered 2021-04-20: 3.5 mg via INTRAVENOUS
  Administered 2021-04-20: 3 mg via INTRAVENOUS
  Administered 2021-04-20: 2.5 mg via INTRAVENOUS
  Administered 2021-04-20: 6 mg via INTRAVENOUS
  Administered 2021-04-21: 2.5 mg via INTRAVENOUS
  Administered 2021-04-21: 0.5 mg via INTRAVENOUS
  Administered 2021-04-21: 3 mg via INTRAVENOUS
  Administered 2021-04-21: 4 mg via INTRAVENOUS
  Administered 2021-04-21: 2.5 mg via INTRAVENOUS
  Administered 2021-04-21: 30 mg via INTRAVENOUS
  Administered 2021-04-22: 2 mg via INTRAVENOUS
  Administered 2021-04-22: 30 mg via INTRAVENOUS
  Administered 2021-04-22: 3 mg via INTRAVENOUS
  Administered 2021-04-22: 2.5 mg via INTRAVENOUS
  Administered 2021-04-22: 4 mg via INTRAVENOUS
  Administered 2021-04-22: 1.5 mg via INTRAVENOUS
  Administered 2021-04-22: 0.7 mg via INTRAVENOUS
  Administered 2021-04-23 (×2): 1.5 mg via INTRAVENOUS
  Filled 2021-04-17 (×4): qty 30

## 2021-04-17 MED ORDER — KETOROLAC TROMETHAMINE 15 MG/ML IJ SOLN
15.0000 mg | Freq: Four times a day (QID) | INTRAMUSCULAR | Status: AC
  Administered 2021-04-17 – 2021-04-21 (×19): 15 mg via INTRAVENOUS
  Filled 2021-04-17 (×19): qty 1

## 2021-04-17 MED ORDER — POLYETHYLENE GLYCOL 3350 17 G PO PACK
17.0000 g | PACK | Freq: Every day | ORAL | Status: DC | PRN
  Administered 2021-04-18 – 2021-04-21 (×4): 17 g via ORAL
  Filled 2021-04-17 (×4): qty 1

## 2021-04-17 MED ORDER — SODIUM CHLORIDE 0.9% IV SOLUTION: Freq: Once | INTRAVENOUS | Status: DC

## 2021-04-17 NOTE — ED Notes (Addendum)
Pt reports she is on a prednisone tamper d/t a rash and needs today's dose.  Medication was started during a recent hospital stay.  Sts the prednisone has triggered her crisis.   Pink top sent to lab and blood band placed.

## 2021-04-17 NOTE — Progress Notes (Signed)
Patient ID: Kara [name], female   DOB: 1985/11/25, 35 y.o.   MRN: [id number] Subjective: Kara [name] is a 35 year old female with a medical history significant for sickle cell disease, chronic pain syndrome, opiate dependence and tolerance, and history of anemia of chronic disease that was admitted with sickle cell pain crisis.  Patient had initially presented to the emergency room earlier this week with complaints of some allergic reaction to unknown allergen, was given high-dose of steroid which tipped patient into sickle cell crisis.  She is still has significant itching and rashes from the allergy.  Pain is 10 out of 10 this morning, uncontrolled with current regimen.  She denies any fever, headache, cough, chest pain, shortness of breath, nausea, vomiting or diarrhea.  No urinary symptoms.  Objective:  Vital signs in last 24 hours:  Vitals:   04/17/21 1029 04/17/21 1201 04/17/21 1245 04/17/21 1257  BP: 102/67  105/65   Pulse: 78  71   Resp: 13 13 13 15   Temp: 99 F (37.2 C)  99 F (37.2 C)   TempSrc:   Oral   SpO2:  92%  92%  Weight:      Height:        Intake/Output from previous day:   Intake/Output Summary (Last 24 hours) at 04/17/2021 1332 Last data filed at 04/17/2021 1245 Gross per 24 hour  Intake 690.45 ml  Output --  Net 690.45 ml    Physical Exam: General: Alert, awake, oriented x3, in acute painful distress.  HEENT: [place]/AT PEERL, EOMI. Facial rashes extending to upper limbs and chest Neck: Trachea midline,  no masses, no thyromegal,y no JVD, no carotid bruit OROPHARYNX:  Moist, No exudate/ erythema/lesions.  Heart: Regular rate and rhythm, without murmurs, rubs, gallops, PMI non-displaced, no heaves or thrills on palpation.  Lungs: Clear to auscultation, no wheezing or rhonchi noted. No increased vocal fremitus resonant to percussion  Abdomen: Soft, nontender, nondistended, positive bowel sounds, no masses no hepatosplenomegaly noted..  Neuro: No focal  neurological deficits noted cranial nerves II through XII grossly intact. DTRs 2+ bilaterally upper and lower extremities. Strength 5 out of 5 in bilateral upper and lower extremities. Musculoskeletal: No warm swelling or erythema around joints, no spinal tenderness noted. Psychiatric: Patient alert and oriented x3, good insight and cognition, good recent to remote recall. Lymph node survey: No cervical axillary or inguinal lymphadenopathy noted.  Lab Results:  Basic Metabolic Panel:    Component Value Date/Time   NA 136 04/17/2021 0014   NA 138 07/14/2019 1128   K 3.6 04/17/2021 0014   CL 102 04/17/2021 0014   CO2 26 04/17/2021 0014   BUN 16 04/17/2021 0014   BUN 7 07/14/2019 1128   CREATININE 0.34 (L) 04/17/2021 0014   GLUCOSE 113 (H) 04/17/2021 0014   CALCIUM 9.1 04/17/2021 0014   CBC:    Component Value Date/Time   WBC 37.3 (H) 04/17/2021 0518   HGB 6.5 (LL) 04/17/2021 0518   HGB 9.1 (L) 07/14/2019 1128   HCT 18.5 (L) 04/17/2021 0518   HCT 27.8 (L) 07/14/2019 1128   PLT 284 04/17/2021 0518   PLT 435 07/14/2019 1128   MCV 98.4 04/17/2021 0518   MCV 103 (H) 07/14/2019 1128   NEUTROABS 17.4 (H) 04/17/2021 0518   NEUTROABS 5.0 07/14/2019 1128   LYMPHSABS 14.3 (H) 04/17/2021 0518   LYMPHSABS 7.3 (H) 07/14/2019 1128   MONOABS 4.4 (H) 04/17/2021 0518   EOSABS 0.0 04/17/2021 0518   EOSABS 0.3 07/14/2019  1128   BASOSABS 0.1 04/17/2021 0518   BASOSABS 0.1 07/14/2019 1128    Recent Results (from the past 240 hour(s))  Resp Panel by RT-PCR (Flu A&B, Covid) Nasopharyngeal Swab     Status: None   Collection Time: 04/17/21 12:14 AM   Specimen: Nasopharyngeal Swab; Nasopharyngeal(NP) swabs in vial transport medium  Result Value Ref Range Status   SARS Coronavirus 2 by RT PCR NEGATIVE NEGATIVE Final    Comment: (NOTE) SARS-CoV-2 target nucleic acids are NOT DETECTED.  The SARS-CoV-2 RNA is generally detectable in upper respiratory specimens during the acute phase of  infection. The lowest concentration of SARS-CoV-2 viral copies this assay can detect is 138 copies/mL. A negative result does not preclude SARS-Cov-2 infection and should not be used as the sole basis for treatment or other patient management decisions. A negative result may occur with  improper specimen collection/handling, submission of specimen other than nasopharyngeal swab, presence of viral mutation(s) within the areas targeted by this assay, and inadequate number of viral copies(<138 copies/mL). A negative result must be combined with clinical observations, patient history, and epidemiological information. The expected result is Negative.  Fact Sheet for Patients:  BloggerCourse.com  Fact Sheet for Healthcare Providers:  SeriousBroker.it  This test is no t yet approved or cleared by the [place] FDA and  has been authorized for detection and/or diagnosis of SARS-CoV-2 by FDA under an Emergency Use Authorization (EUA). This EUA will remain  in effect (meaning this test can be used) for the duration of the COVID-19 declaration under Section 564(b)(1) of the Act, 21 U.S.C.section 360bbb-3(b)(1), unless the authorization is terminated  or revoked sooner.       Influenza A by PCR NEGATIVE NEGATIVE Final   Influenza B by PCR NEGATIVE NEGATIVE Final    Comment: (NOTE) The Xpert Xpress SARS-CoV-2/FLU/RSV plus assay is intended as an aid in the diagnosis of influenza from Nasopharyngeal swab specimens and should not be used as a sole basis for treatment. Nasal washings and aspirates are unacceptable for Xpert Xpress SARS-CoV-2/FLU/RSV testing.  Fact Sheet for Patients: BloggerCourse.com  Fact Sheet for Healthcare Providers: SeriousBroker.it  This test is not yet approved or cleared by the [place] FDA and has been authorized for detection and/or diagnosis of SARS-CoV-2  by FDA under an Emergency Use Authorization (EUA). This EUA will remain in effect (meaning this test can be used) for the duration of the COVID-19 declaration under Section 564(b)(1) of the Act, 21 U.S.C. section 360bbb-3(b)(1), unless the authorization is terminated or revoked.  Performed at [facility], [address], [place], [place] [address]     Studies/Results: DG Chest Port 1 View  Result Date: 04/16/2021 CLINICAL DATA:  Low O2 sat sickle cell crisis EXAM: PORTABLE CHEST 1 VIEW COMPARISON:  08/27/2020 FINDINGS: No focal opacity or pleural effusion. Stable cardiomediastinal silhouette. No pneumothorax. IMPRESSION: No active disease. Electronically Signed   By: Jasmine Pang M.D.   On: 04/16/2021 23:52    Medications: Scheduled Meds: . enoxaparin (LOVENOX) injection  40 mg Subcutaneous Q24H  . HYDROmorphone   Intravenous Q4H  . hydrOXYzine  10 mg Oral q morning   And  . hydrOXYzine  10 mg Oral QHS  . ketorolac  15 mg Intravenous Q6H  . methylPREDNISolone (SOLU-MEDROL) injection  40 mg Intravenous Q12H  . senna-docusate  1 tablet Oral BID   Continuous Infusions: . sodium chloride 125 mL/hr at 04/17/21 1257   PRN Meds:.diphenhydrAMINE, naloxone **AND** sodium chloride flush, ondansetron, polyethylene  glycol  Consultants:  None  Procedures:  None  Antibiotics:  None  Assessment/Plan: Principal Problem:   Sickle cell pain crisis (HCC) Active Problems:   Leukocytosis   Chronic pain syndrome   Sickle cell anemia (HCC)  1. Hb Sickle Cell Disease with crisis: Continue IVF 0.45% Saline @ 125 mls/hour, change the setting of weight based Dilaudid PCA to 0.5/10/3, continue IV Toradol 15 mg Q 6 H for total of 5 days, restart and continue home pain medications.  Monitor vitals very closely, Re-evaluate pain scale regularly, 2 L of Oxygen by [place]. 2. Leukocytosis: Most likely reactive from steroid induced. There is no evidence of infection or  inflammation. We will manage without antibiotics at this time.  Monitor very closely and repeat labs in AM. 3. Sickle Cell Anemia: Hemoglobin dropped below baseline, patient is currently on transfusion of 1 unit of packed red blood cell.  We will monitor closely and review labs in AM post transfusion. 4. Chronic pain Syndrome: Patient is opiate tolerant, will restart continue her home pain medications. 5. Allergic reaction: Source unknown. Will order IV Solu-Medrol every 12 hourly and change to prednisone as appropriate. Continue antihistamines.  Code Status: Full Code Family Communication: N/A Disposition Plan: Not yet ready for discharge  Alyana Kreiter  If 7PM-7AM, please contact night-coverage.  04/17/2021, 1:32 PM  LOS: 0 days

## 2021-04-17 NOTE — H&P (Addendum)
History and Physical    Kara [name] GXQ:[id number] DOB: 1986/03/15 DOA: 04/16/2021  PCP: Collins Scotland Cell, MD Patient coming from: Home  Chief Complaint: Sickle cell pain crisis  HPI: Kara [name] is a 35 y.o. female with medical history significant of sickle cell disease, chronic pain syndrome, opiate dependence and tolerance, pulmonary hypertension, GERD, marijuana use, tobacco use, anxiety presented to the ED with a chief complaint of sickle cell pain crisis.  In the ED, vital signs stable.  Labs showing WBC 28.4, hemoglobin 6.7 (below baseline), platelet count 307K.  Sodium 136, potassium 3.6, chloride 102, bicarb 26, BUN 16, creatinine 0.3, glucose 113.  T bili 4.3 (chronically elevated in the setting of hemolysis).  Remainder of LFTs normal.  Absolute reticulocyte count 185.  COVID and influenza PCR negative.  Beta-hCG negative.  Chest x-ray showing no active disease. Patient was given doses of IV Dilaudid but continued to complain of pain.  1 unit PRBCs ordered.  Patient states she is having sickle cell pain all over her body.  Pain in her legs, back, and chest.  States she was seen in the ED a few days ago for a skin allergic reaction and was prescribed a steroid which she thinks triggered her sickle cell pain.  States she is taking her home immediate release and extended release oxycodone but still in pain.  Denies cough or shortness of breath.  She is not sure if she was having fevers at home.  Denies nausea, vomiting, abdominal pain, diarrhea, or dysuria.  Review of Systems:  All systems reviewed and apart from history of presenting illness, are negative.  Past Medical History:  Diagnosis Date  . Arthritis   . Chronic migraine   . GERD (gastroesophageal reflux disease)   . Hx of cholecystectomy 2015  . Pulmonary hypertension (HCC)   . Sickle cell anemia (HCC)   . Tendinitis    left elbow    Past Surgical History:  Procedure Laterality Date  . DILATION AND  CURETTAGE OF UTERUS    . GALLBLADDER SURGERY       reports that she has been smoking. She has been smoking about 0.50 packs per day. She has never used smokeless tobacco. She reports current drug use. Drug: Marijuana. She reports that she does not drink alcohol.  Allergies  Allergen Reactions  . Kiwi Extract Hives  . Morphine And Related Hives  . Paroxetine Hives    Family History  Adopted: Yes    Prior to Admission medications   Medication Sig Start Date End Date Taking? Authorizing Provider  butalbital-acetaminophen-caffeine (FIORICET) 50-325-40 MG tablet Take 1 tablet by mouth every 6 (six) hours as needed for headache. 06/11/20   [name], FNP  cetirizine (ZYRTEC) 10 MG tablet Take 1 tablet (10 mg total) by mouth daily. Patient taking differently: Take 10 mg by mouth daily as needed for allergies. 04/14/20   [name], FNP  Ensure Plus (ENSURE PLUS) LIQD Take 237 mLs by mouth 3 (three) times daily between meals. Patient not taking: Reported on 02/09/2021 08/26/19   [name], FNP  famotidine (PEPCID) 20 MG tablet Take 1 tablet (20 mg total) by mouth 2 (two) times daily. 04/12/21   [name], [name], PA-C  folic acid (FOLVITE) 1 MG tablet Take 1 tablet (1 mg total) by mouth daily. 02/05/21   [name], FNP  hydrOXYzine (ATARAX/VISTARIL) 10 MG tablet Take 10 mg by mouth in the morning and at bedtime. Patient not taking: Reported  on 02/09/2021    [provider]  methocarbamol (ROBAXIN) 500 MG tablet Take 1 tablet (500 mg total) by mouth in the morning and at bedtime. Patient taking differently: Take 500 mg by mouth in the morning, at noon, in the evening, and at bedtime. 04/14/20   [name], FNP  naproxen (NAPROSYN) 500 MG tablet Take 1 tablet (500 mg total) by mouth 2 (two) times daily with a meal. 11/27/20   [name], FNP  omeprazole (PRILOSEC) 40 MG capsule Take 1 capsule (40 mg total) by mouth daily. 04/14/20   [name], FNP   oxyCODONE (ROXICODONE) 15 MG immediate release tablet Take 1 tablet (15 mg total) by mouth every 4 (four) hours as needed for up to 15 days. 04/05/21 04/20/21  Massie Maroon, FNP  predniSONE (STERAPRED UNI-PAK 21 TAB) 10 MG (21) TBPK tablet Take by mouth daily. Take 6 tabs by mouth daily  for 2 days, then 5 tabs for 2 days, then 4 tabs for 2 days, then 3 tabs for 2 days, 2 tabs for 2 days, then 1 tab by mouth daily for 2 days 04/12/21   Idelle Leech, [name], PA-C  Vitamin D, Ergocalciferol, (DRISDOL) 1.25 MG (50000 UNIT) CAPS capsule Take 1 capsule (50,000 Units total) by mouth every 7 (seven) days. Patient taking differently: Take 50,000 Units by mouth every 7 (seven) days. Mondays 04/14/20   [name], FNP    Physical Exam: Vitals:   04/17/21 0030 04/17/21 0100 04/17/21 0130 04/17/21 0200  BP: (!) 111/59 (!) 106/59 118/67 106/68  Pulse: 73 76 72 66  Resp: 20 18 17 18   Temp:      TempSrc:      SpO2: 94% 94% 97% 97%  Weight:      Height:        Physical Exam Constitutional:      General: She is not in acute distress. HENT:     Head: Normocephalic and atraumatic.  Eyes:     Extraocular Movements: Extraocular movements intact.     Conjunctiva/sclera: Conjunctivae normal.  Cardiovascular:     Rate and Rhythm: Normal rate and regular rhythm.     Pulses: Normal pulses.  Pulmonary:     Effort: Pulmonary effort is normal. No respiratory distress.     Breath sounds: Normal breath sounds. No wheezing or rales.  Abdominal:     General: Bowel sounds are normal. There is no distension.     Palpations: Abdomen is soft.     Tenderness: There is no abdominal tenderness.  Musculoskeletal:        General: No swelling or tenderness.     Cervical back: Normal range of motion and neck supple.  Skin:    General: Skin is warm and dry.  Neurological:     General: No focal deficit present.     Mental Status: She is alert and oriented to person, place, and time.     Labs on Admission: I have  personally reviewed following labs and imaging studies  CBC: Recent Labs  Lab 04/17/21 0014  WBC 28.4*  NEUTROABS 16.8*  HGB 6.7*  HCT 19.2*  MCV 98.0  PLT 307   Basic Metabolic Panel: Recent Labs  Lab 04/17/21 0014  NA 136  K 3.6  CL 102  CO2 26  GLUCOSE 113*  BUN 16  CREATININE 0.34*  CALCIUM 9.1   GFR: Estimated Creatinine Clearance: 82 mL/min (A) (by C-G formula based on SCr of 0.34 mg/dL (L)). Liver  Function Tests: Recent Labs  Lab 04/17/21 0014  AST 25  ALT 13  ALKPHOS 41  BILITOT 4.3*  PROT 7.8  ALBUMIN 4.2   No results for input(s): LIPASE, AMYLASE in the last 168 hours. No results for input(s): AMMONIA in the last 168 hours. Coagulation Profile: No results for input(s): INR, PROTIME in the last 168 hours. Cardiac Enzymes: No results for input(s): CKTOTAL, CKMB, CKMBINDEX, TROPONINI in the last 168 hours. BNP (last 3 results) No results for input(s): PROBNP in the last 8760 hours. HbA1C: No results for input(s): HGBA1C in the last 72 hours. CBG: No results for input(s): GLUCAP in the last 168 hours. Lipid Profile: No results for input(s): CHOL, HDL, LDLCALC, TRIG, CHOLHDL, LDLDIRECT in the last 72 hours. Thyroid Function Tests: No results for input(s): TSH, T4TOTAL, FREET4, T3FREE, THYROIDAB in the last 72 hours. Anemia Panel: Recent Labs    04/17/21 0014  RETICCTPCT 9.8*   Urine analysis:    Component Value Date/Time   COLORURINE AMBER (A) 12/01/2020 2125   APPEARANCEUR HAZY (A) 12/01/2020 2125   LABSPEC 1.013 12/01/2020 2125   PHURINE 5.0 12/01/2020 2125   GLUCOSEU NEGATIVE 12/01/2020 2125   HGBUR NEGATIVE 12/01/2020 2125   BILIRUBINUR NEGATIVE 12/01/2020 2125   BILIRUBINUR small 06/11/2020 1217   KETONESUR NEGATIVE 12/01/2020 2125   PROTEINUR NEGATIVE 12/01/2020 2125   UROBILINOGEN >=8.0 (A) 06/11/2020 1217   NITRITE NEGATIVE 12/01/2020 2125   LEUKOCYTESUR NEGATIVE 12/01/2020 2125    Radiological Exams on Admission: DG Chest  Port 1 View  Result Date: 04/16/2021 CLINICAL DATA:  Low O2 sat sickle cell crisis EXAM: PORTABLE CHEST 1 VIEW COMPARISON:  08/27/2020 FINDINGS: No focal opacity or pleural effusion. Stable cardiomediastinal silhouette. No pneumothorax. IMPRESSION: No active disease. Electronically Signed   By: Jasmine Pang M.D.   On: 04/16/2021 23:52    Assessment/Plan Principal Problem:   Sickle cell pain crisis (HCC) Active Problems:   Leukocytosis   Chronic pain syndrome   Sickle cell anemia (HCC)   Sickle cell pain crisis -Dilaudid PCA, IV Toradol 15 mg every 6 hours, 0.45% saline at 75 cc/h.  Continuous pulse ox.  Monitor vital signs closely.  Reevaluate pain scale regularly.  Sickle cell anemia Hemoglobin currently 6.7 which is below her baseline. -1 unit PRBCs ordered in the ED.  Check posttransfusion H&H.  Chronic pain syndrome -Dilaudid PCA as mentioned above.  Pharmacy med rec pending.  Leukocytosis Likely related to steroid use.  No fever, tachycardia, or signs of sepsis.  Chest x-ray not suggestive of pneumonia.   -UA to rule out UTI.  Check procalcitonin level.  Repeat CBC.  Blood culture x2 ordered.  Addendum: Chest pain: Chest x-ray without acute process.  Check high-sensitivity troponin x2 and EKG.  DVT prophylaxis: Lovenox Code Status: Full code Family Communication: No family at bedside. Disposition Plan: Status is: Observation  The patient remains OBS appropriate and will d/c before 2 midnights.  Dispo: The patient is from: Home              Anticipated d/c is to: Home              Patient currently is not medically stable to d/c.   Difficult to place patient No  Level of care: Level of care: Telemetry   The medical decision making on this patient was of high complexity and the patient is at high risk for clinical deterioration, therefore this is a level 3 visit.  John Giovanni MD [facility]  If 7PM-7AM, please contact  night-coverage [email or web address]  04/17/2021, 2:21 AM

## 2021-04-17 NOTE — ED Notes (Signed)
Pt drinking ice water.  Unable to obtain new temp.

## 2021-04-18 DIAGNOSIS — D5700 Hb-SS disease with crisis, unspecified: Secondary | ICD-10-CM | POA: Diagnosis not present

## 2021-04-18 DIAGNOSIS — D72829 Elevated white blood cell count, unspecified: Secondary | ICD-10-CM | POA: Diagnosis not present

## 2021-04-18 DIAGNOSIS — G894 Chronic pain syndrome: Secondary | ICD-10-CM | POA: Diagnosis not present

## 2021-04-18 LAB — CBC WITH DIFFERENTIAL/PLATELET
Abs Immature Granulocytes: 0.35 10*3/uL — ABNORMAL HIGH (ref 0.00–0.07)
Basophils Absolute: 0 10*3/uL (ref 0.0–0.1)
Basophils Relative: 0 %
Eosinophils Absolute: 0 10*3/uL (ref 0.0–0.5)
Eosinophils Relative: 0 %
HCT: 21.8 % — ABNORMAL LOW (ref 36.0–46.0)
Hemoglobin: 7.6 g/dL — ABNORMAL LOW (ref 12.0–15.0)
Immature Granulocytes: 2 %
Lymphocytes Relative: 31 %
Lymphs Abs: 5.5 10*3/uL — ABNORMAL HIGH (ref 0.7–4.0)
MCH: 34.2 pg — ABNORMAL HIGH (ref 26.0–34.0)
MCHC: 34.9 g/dL (ref 30.0–36.0)
MCV: 98.2 fL (ref 80.0–100.0)
Monocytes Absolute: 1.4 10*3/uL — ABNORMAL HIGH (ref 0.1–1.0)
Monocytes Relative: 8 %
Neutro Abs: 10.3 10*3/uL — ABNORMAL HIGH (ref 1.7–7.7)
Neutrophils Relative %: 59 %
Platelets: 347 10*3/uL (ref 150–400)
RBC: 2.22 MIL/uL — ABNORMAL LOW (ref 3.87–5.11)
RDW: 19.5 % — ABNORMAL HIGH (ref 11.5–15.5)
WBC: 17.6 10*3/uL — ABNORMAL HIGH (ref 4.0–10.5)
nRBC: 8.9 % — ABNORMAL HIGH (ref 0.0–0.2)

## 2021-04-18 LAB — BASIC METABOLIC PANEL
Anion gap: 6 (ref 5–15)
BUN: 11 mg/dL (ref 6–20)
CO2: 27 mmol/L (ref 22–32)
Calcium: 8.9 mg/dL (ref 8.9–10.3)
Chloride: 102 mmol/L (ref 98–111)
Creatinine, Ser: <0.3 mg/dL — ABNORMAL LOW (ref 0.44–1.00)
Glucose, Bld: 107 mg/dL — ABNORMAL HIGH (ref 70–99)
Potassium: 4.1 mmol/L (ref 3.5–5.1)
Sodium: 135 mmol/L (ref 135–145)

## 2021-04-18 NOTE — Progress Notes (Signed)
Patient ID: Kara [name], female   DOB: Mar 31, 1986, 35 y.o.   MRN: [id number] Subjective: Rhodie Cienfuegos is a 35 year old female with a medical history significant for sickle cell disease, chronic pain syndrome, opiate dependence and tolerance, and history of anemia of chronic disease that was admitted with sickle cell pain crisis.  Patient's general condition is slightly improved today, pain is down to 7/10.  Still having itching but rash is slowly improving.  There is no redness.  No crust.  She denies any fever, headache, cough, chest pain, shortness of breath, nausea, vomiting or diarrhea.  Patient is tolerating p.o. intake well.  She is responding well to MiraLAX.  Objective:  Vital signs in last 24 hours:  Vitals:   04/18/21 0458 04/18/21 0719 04/18/21 1146 04/18/21 1319  BP:    105/68  Pulse:    69  Resp: 14 17 13 20   Temp:    98.5 F (36.9 C)  TempSrc:    Oral  SpO2: 96% 96% 97% 96%  Weight:      Height:        Intake/Output from previous day:   Intake/Output Summary (Last 24 hours) at 04/18/2021 1414 Last data filed at 04/17/2021 1716 Gross per 24 hour  Intake 240 ml  Output --  Net 240 ml    Physical Exam: General: Alert, awake, oriented x3, in acute painful distress.  HEENT: /AT PEERL, EOMI. Facial rashes extending to upper limbs and chest Neck: Trachea midline,  no masses, no thyromegal,y no JVD, no carotid bruit OROPHARYNX:  Moist, No exudate/ erythema/lesions.  Heart: Regular rate and rhythm, without murmurs, rubs, gallops, PMI non-displaced, no heaves or thrills on palpation.  Lungs: Clear to auscultation, no wheezing or rhonchi noted. No increased vocal fremitus resonant to percussion  Abdomen: Soft, nontender, nondistended, positive bowel sounds, no masses no hepatosplenomegaly noted..  Neuro: No focal neurological deficits noted cranial nerves II through XII grossly intact. DTRs 2+ bilaterally upper and lower extremities. Strength 5 out of 5 in  bilateral upper and lower extremities. Musculoskeletal: No warm swelling or erythema around joints, no spinal tenderness noted. Psychiatric: Patient alert and oriented x3, good insight and cognition, good recent to remote recall. Lymph node survey: No cervical axillary or inguinal lymphadenopathy noted.  Lab Results:  Basic Metabolic Panel:    Component Value Date/Time   NA 135 04/18/2021 0832   NA 138 07/14/2019 1128   K 4.1 04/18/2021 0832   CL 102 04/18/2021 0832   CO2 27 04/18/2021 0832   BUN 11 04/18/2021 0832   BUN 7 07/14/2019 1128   CREATININE <0.30 (L) 04/18/2021 0832   GLUCOSE 107 (H) 04/18/2021 0832   CALCIUM 8.9 04/18/2021 0832   CBC:    Component Value Date/Time   WBC 17.6 (H) 04/18/2021 0832   HGB 7.6 (L) 04/18/2021 0832   HGB 9.1 (L) 07/14/2019 1128   HCT 21.8 (L) 04/18/2021 0832   HCT 27.8 (L) 07/14/2019 1128   PLT 347 04/18/2021 0832   PLT 435 07/14/2019 1128   MCV 98.2 04/18/2021 0832   MCV 103 (H) 07/14/2019 1128   NEUTROABS 10.3 (H) 04/18/2021 0832   NEUTROABS 5.0 07/14/2019 1128   LYMPHSABS 5.5 (H) 04/18/2021 0832   LYMPHSABS 7.3 (H) 07/14/2019 1128   MONOABS 1.4 (H) 04/18/2021 0832   EOSABS 0.0 04/18/2021 0832   EOSABS 0.3 07/14/2019 1128   BASOSABS 0.0 04/18/2021 0832   BASOSABS 0.1 07/14/2019 1128    Recent Results (from the past  240 hour(s))  Resp Panel by RT-PCR (Flu A&B, Covid) Nasopharyngeal Swab     Status: None   Collection Time: 04/17/21 12:14 AM   Specimen: Nasopharyngeal Swab; Nasopharyngeal(NP) swabs in vial transport medium  Result Value Ref Range Status   SARS Coronavirus 2 by RT PCR NEGATIVE NEGATIVE Final    Comment: (NOTE) SARS-CoV-2 target nucleic acids are NOT DETECTED.  The SARS-CoV-2 RNA is generally detectable in upper respiratory specimens during the acute phase of infection. The lowest concentration of SARS-CoV-2 viral copies this assay can detect is 138 copies/mL. A negative result does not preclude  SARS-Cov-2 infection and should not be used as the sole basis for treatment or other patient management decisions. A negative result may occur with  improper specimen collection/handling, submission of specimen other than nasopharyngeal swab, presence of viral mutation(s) within the areas targeted by this assay, and inadequate number of viral copies(<138 copies/mL). A negative result must be combined with clinical observations, patient history, and epidemiological information. The expected result is Negative.  Fact Sheet for Patients:  BloggerCourse.com  Fact Sheet for Healthcare Providers:  SeriousBroker.it  This test is no t yet approved or cleared by the [place] FDA and  has been authorized for detection and/or diagnosis of SARS-CoV-2 by FDA under an Emergency Use Authorization (EUA). This EUA will remain  in effect (meaning this test can be used) for the duration of the COVID-19 declaration under Section 564(b)(1) of the Act, 21 U.S.C.section 360bbb-3(b)(1), unless the authorization is terminated  or revoked sooner.       Influenza A by PCR NEGATIVE NEGATIVE Final   Influenza B by PCR NEGATIVE NEGATIVE Final    Comment: (NOTE) The Xpert Xpress SARS-CoV-2/FLU/RSV plus assay is intended as an aid in the diagnosis of influenza from Nasopharyngeal swab specimens and should not be used as a sole basis for treatment. Nasal washings and aspirates are unacceptable for Xpert Xpress SARS-CoV-2/FLU/RSV testing.  Fact Sheet for Patients: BloggerCourse.com  Fact Sheet for Healthcare Providers: SeriousBroker.it  This test is not yet approved or cleared by the [place] FDA and has been authorized for detection and/or diagnosis of SARS-CoV-2 by FDA under an Emergency Use Authorization (EUA). This EUA will remain in effect (meaning this test can be used) for the duration of  the COVID-19 declaration under Section 564(b)(1) of the Act, 21 U.S.C. section 360bbb-3(b)(1), unless the authorization is terminated or revoked.  Performed at [facility], [address] [place], [place] [address]   Culture, blood (routine x 2)     Status: None (Preliminary result)   Collection Time: 04/17/21  5:18 AM   Specimen: BLOOD  Result Value Ref Range Status   Specimen Description   Final    BLOOD BLOOD LEFT HAND Performed at [facility], [address] [place], [place] [address]    Special Requests   Final    BOTTLES DRAWN AEROBIC ONLY Blood Culture adequate volume Performed at [facility], [address], [place], [place] [address]    Culture   Final    NO GROWTH 1 DAY Performed at [facility], [address] [place], [place] [address]    Report Status PENDING  Incomplete  Culture, blood (routine x 2)     Status: None (Preliminary result)   Collection Time: 04/17/21  5:18 AM   Specimen: BLOOD  Result Value Ref Range Status   Specimen Description   Final    BLOOD BLOOD LEFT FOREARM  Performed at [facility], [address], [place], [place] [address]    Special Requests   Final    BOTTLES DRAWN AEROBIC ONLY Blood Culture adequate volume Performed at [facility], [address], [place], [place] [address]    Culture   Final    NO GROWTH 1 DAY Performed at [facility], [address] [place], [place] [address]    Report Status PENDING  Incomplete    Studies/Results: DG Chest Port 1 View  Result Date: 04/16/2021 CLINICAL DATA:  Low O2 sat sickle cell crisis EXAM: PORTABLE CHEST 1 VIEW COMPARISON:  08/27/2020 FINDINGS: No focal opacity or pleural effusion. Stable cardiomediastinal silhouette. No pneumothorax. IMPRESSION: No active disease. Electronically Signed   By: Jasmine Pang M.D.   On: 04/16/2021 23:52    Medications: Scheduled Meds: .  enoxaparin (LOVENOX) injection  40 mg Subcutaneous Q24H  . HYDROmorphone   Intravenous Q4H  . hydrOXYzine  10 mg Oral q morning   And  . hydrOXYzine  10 mg Oral QHS  . ketorolac  15 mg Intravenous Q6H  . methylPREDNISolone (SOLU-MEDROL) injection  40 mg Intravenous Q12H  . senna-docusate  1 tablet Oral BID   Continuous Infusions: . sodium chloride 125 mL/hr at 04/18/21 0921   PRN Meds:.diphenhydrAMINE, naloxone **AND** sodium chloride flush, ondansetron, polyethylene glycol  Consultants:  None  Procedures:  None  Antibiotics:  None  Assessment/Plan: Principal Problem:   Sickle cell pain crisis (HCC) Active Problems:   Leukocytosis   Chronic pain syndrome   Sickle cell anemia (HCC)  1. Hb Sickle Cell Disease with crisis: We will reduce IVF 0.45% Saline to 75 mls/hour, we will continue weight based Dilaudid PCA at current setting of 0.5/10/3, continue IV Toradol 15 mg Q 6 H for total of 5 days, continue home pain medications.  Monitor vitals very closely, Re-evaluate pain scale regularly, 2 L of Oxygen by [place]. 2. Leukocytosis: Most likely reactive from steroid induced. There is no evidence of infection or inflammation. We will continue to manage without antibiotics. Monitor very closely and repeat labs in AM. 3. Sickle Cell Anemia: Patient is status post blood transfusion of 1 unit of packed red blood cell, hemoglobin is now 7.8 which is close to patient's baseline. We will continue to monitor closely and review labs in AM. 4. Chronic pain Syndrome: Patient is opiate tolerant, will continue her home pain medications. 5. Allergic reaction: Source unknown. Will order IV Solu-Medrol every 12 hourly for 2 more doses and change to prednisone as appropriate. Continue antihistamines.  Code Status: Full Code Family Communication: N/A Disposition Plan: Not yet ready for discharge  Shivangi Lutz  If 7PM-7AM, please contact night-coverage.  04/18/2021, 2:14 PM  LOS: 1 day

## 2021-04-19 DIAGNOSIS — G894 Chronic pain syndrome: Secondary | ICD-10-CM | POA: Diagnosis not present

## 2021-04-19 DIAGNOSIS — D72829 Elevated white blood cell count, unspecified: Secondary | ICD-10-CM | POA: Diagnosis not present

## 2021-04-19 DIAGNOSIS — D5700 Hb-SS disease with crisis, unspecified: Secondary | ICD-10-CM | POA: Diagnosis not present

## 2021-04-19 LAB — CBC WITH DIFFERENTIAL/PLATELET
Abs Immature Granulocytes: 0.31 10*3/uL — ABNORMAL HIGH (ref 0.00–0.07)
Basophils Absolute: 0 10*3/uL (ref 0.0–0.1)
Basophils Relative: 0 %
Eosinophils Absolute: 0 10*3/uL (ref 0.0–0.5)
Eosinophils Relative: 0 %
HCT: 23.6 % — ABNORMAL LOW (ref 36.0–46.0)
Hemoglobin: 7.9 g/dL — ABNORMAL LOW (ref 12.0–15.0)
Immature Granulocytes: 2 %
Lymphocytes Relative: 31 %
Lymphs Abs: 5 10*3/uL — ABNORMAL HIGH (ref 0.7–4.0)
MCH: 34.3 pg — ABNORMAL HIGH (ref 26.0–34.0)
MCHC: 33.5 g/dL (ref 30.0–36.0)
MCV: 102.6 fL — ABNORMAL HIGH (ref 80.0–100.0)
Monocytes Absolute: 1.2 10*3/uL — ABNORMAL HIGH (ref 0.1–1.0)
Monocytes Relative: 8 %
Neutro Abs: 9.8 10*3/uL — ABNORMAL HIGH (ref 1.7–7.7)
Neutrophils Relative %: 59 %
Platelets: 416 10*3/uL — ABNORMAL HIGH (ref 150–400)
RBC: 2.3 MIL/uL — ABNORMAL LOW (ref 3.87–5.11)
RDW: 22 % — ABNORMAL HIGH (ref 11.5–15.5)
WBC: 16.3 10*3/uL — ABNORMAL HIGH (ref 4.0–10.5)
nRBC: 15.9 % — ABNORMAL HIGH (ref 0.0–0.2)

## 2021-04-19 LAB — TYPE AND SCREEN
ABO/RH(D): A POS
Antibody Screen: NEGATIVE
Donor AG Type: NEGATIVE
Unit division: 0

## 2021-04-19 LAB — BPAM RBC
Blood Product Expiration Date: 202207062359
ISSUE DATE / TIME: 202205291008
Unit Type and Rh: 5100

## 2021-04-19 LAB — PATHOLOGIST SMEAR REVIEW

## 2021-04-19 MED ORDER — PREDNISONE 20 MG PO TABS
20.0000 mg | ORAL_TABLET | Freq: Two times a day (BID) | ORAL | Status: DC
  Administered 2021-04-19 – 2021-04-23 (×8): 20 mg via ORAL
  Filled 2021-04-19 (×8): qty 1

## 2021-04-19 NOTE — Progress Notes (Signed)
Nutrition Brief Note  Patient identified on the Malnutrition Screening Tool (MST) Report  Wt Readings from Last 15 Encounters:  04/17/21 54.9 kg  03/01/21 53.5 kg  02/09/21 55.8 kg  12/05/20 58.8 kg  12/01/20 54.4 kg  10/04/20 54.4 kg  08/28/20 54.4 kg  08/27/20 55.3 kg  07/29/20 54.6 kg  06/15/20 53.1 kg  06/11/20 53.1 kg  06/01/20 54.4 kg  05/14/20 54.9 kg  04/13/20 56.2 kg  04/02/20 55.8 kg    Body mass index is 21.44 kg/m. Patient meets criteria for normal based on current BMI.   Current diet order is regular, patient is consuming approximately 100% of meals at this time. Labs and medications reviewed.   No nutrition interventions warranted at this time. If nutrition issues arise, please consult RD.   Tilda Franco, MS, RD, LDN Inpatient Clinical Dietitian Contact information available via Amion

## 2021-04-19 NOTE — Plan of Care (Signed)
Pt reports that sickle cell pain improved a little; her legs are the main source of pain at the moment; she was able to ambulate to bathroom on her own but with supervision for safety. She is now resting comfortably with call light within reach and bed at lowest position for safety. No s/s of acute distress reported or observed.

## 2021-04-19 NOTE — Progress Notes (Signed)
Patient ID: Deaysia Grigoryan, female   DOB: 1986/07/26, 35 y.o.   MRN: [id number] Subjective: Wesleigh Markovic is a 35 year old female with a medical history significant for sickle cell disease, chronic pain syndrome, opiate dependence and tolerance, and history of anemia of chronic disease that was admitted with sickle cell pain crisis.  Patient's general condition remains the same today, she is still complaining of pain that is between 6 and 7/10.  She is still complaining of facial rash and itching.  She has no other symptoms.  She has had no bowel movement even with her MiraLAX but no abdominal distention, no nausea or vomiting.  No urinary symptoms.  No fever.  Objective:  Vital signs in last 24 hours:  Vitals:   04/19/21 1046 04/19/21 1141 04/19/21 1408 04/19/21 1608  BP: 119/63  122/76   Pulse: 83  64   Resp: 15 13 16 14   Temp: 98.7 F (37.1 C)  98.8 F (37.1 C)   TempSrc: Oral  Oral   SpO2: 94% 96% 96% 97%  Weight:      Height:        Intake/Output from previous day:   Intake/Output Summary (Last 24 hours) at 04/19/2021 1734 Last data filed at 04/19/2021 1000 Gross per 24 hour  Intake 120 ml  Output --  Net 120 ml    Physical Exam: General: Alert, awake, oriented x3, in acute painful distress.  HEENT: [place]/AT PEERL, EOMI. Facial rashes extending to upper limbs and chest Neck: Trachea midline,  no masses, no thyromegal,y no JVD, no carotid bruit OROPHARYNX:  Moist, No exudate/ erythema/lesions.  Heart: Regular rate and rhythm, without murmurs, rubs, gallops, PMI non-displaced, no heaves or thrills on palpation.  Lungs: Clear to auscultation, no wheezing or rhonchi noted. No increased vocal fremitus resonant to percussion  Abdomen: Soft, nontender, nondistended, positive bowel sounds, no masses no hepatosplenomegaly noted..  Neuro: No focal neurological deficits noted cranial nerves II through XII grossly intact. DTRs 2+ bilaterally upper and lower extremities. Strength 5  out of 5 in bilateral upper and lower extremities. Musculoskeletal: No warm swelling or erythema around joints, no spinal tenderness noted. Psychiatric: Patient alert and oriented x3, good insight and cognition, good recent to remote recall. Lymph node survey: No cervical axillary or inguinal lymphadenopathy noted.  Lab Results:  Basic Metabolic Panel:    Component Value Date/Time   NA 135 04/18/2021 0832   NA 138 07/14/2019 1128   K 4.1 04/18/2021 0832   CL 102 04/18/2021 0832   CO2 27 04/18/2021 0832   BUN 11 04/18/2021 0832   BUN 7 07/14/2019 1128   CREATININE <0.30 (L) 04/18/2021 0832   GLUCOSE 107 (H) 04/18/2021 0832   CALCIUM 8.9 04/18/2021 0832   CBC:    Component Value Date/Time   WBC 16.3 (H) 04/19/2021 0514   HGB 7.9 (L) 04/19/2021 0514   HGB 9.1 (L) 07/14/2019 1128   HCT 23.6 (L) 04/19/2021 0514   HCT 27.8 (L) 07/14/2019 1128   PLT 416 (H) 04/19/2021 0514   PLT 435 07/14/2019 1128   MCV 102.6 (H) 04/19/2021 0514   MCV 103 (H) 07/14/2019 1128   NEUTROABS 9.8 (H) 04/19/2021 0514   NEUTROABS 5.0 07/14/2019 1128   LYMPHSABS 5.0 (H) 04/19/2021 0514   LYMPHSABS 7.3 (H) 07/14/2019 1128   MONOABS 1.2 (H) 04/19/2021 0514   EOSABS 0.0 04/19/2021 0514   EOSABS 0.3 07/14/2019 1128   BASOSABS 0.0 04/19/2021 0514   BASOSABS 0.1 07/14/2019 1128  Recent Results (from the past 240 hour(s))  Resp Panel by RT-PCR (Flu A&B, Covid) Nasopharyngeal Swab     Status: None   Collection Time: 04/17/21 12:14 AM   Specimen: Nasopharyngeal Swab; Nasopharyngeal(NP) swabs in vial transport medium  Result Value Ref Range Status   SARS Coronavirus 2 by RT PCR NEGATIVE NEGATIVE Final    Comment: (NOTE) SARS-CoV-2 target nucleic acids are NOT DETECTED.  The SARS-CoV-2 RNA is generally detectable in upper respiratory specimens during the acute phase of infection. The lowest concentration of SARS-CoV-2 viral copies this assay can detect is 138 copies/mL. A negative result does not  preclude SARS-Cov-2 infection and should not be used as the sole basis for treatment or other patient management decisions. A negative result may occur with  improper specimen collection/handling, submission of specimen other than nasopharyngeal swab, presence of viral mutation(s) within the areas targeted by this assay, and inadequate number of viral copies(<138 copies/mL). A negative result must be combined with clinical observations, patient history, and epidemiological information. The expected result is Negative.  Fact Sheet for Patients:  BloggerCourse.com  Fact Sheet for Healthcare Providers:  SeriousBroker.it  This test is no t yet approved or cleared by the [place] FDA and  has been authorized for detection and/or diagnosis of SARS-CoV-2 by FDA under an Emergency Use Authorization (EUA). This EUA will remain  in effect (meaning this test can be used) for the duration of the COVID-19 declaration under Section 564(b)(1) of the Act, 21 U.S.C.section 360bbb-3(b)(1), unless the authorization is terminated  or revoked sooner.       Influenza A by PCR NEGATIVE NEGATIVE Final   Influenza B by PCR NEGATIVE NEGATIVE Final    Comment: (NOTE) The Xpert Xpress SARS-CoV-2/FLU/RSV plus assay is intended as an aid in the diagnosis of influenza from Nasopharyngeal swab specimens and should not be used as a sole basis for treatment. Nasal washings and aspirates are unacceptable for Xpert Xpress SARS-CoV-2/FLU/RSV testing.  Fact Sheet for Patients: BloggerCourse.com  Fact Sheet for Healthcare Providers: SeriousBroker.it  This test is not yet approved or cleared by the [place] FDA and has been authorized for detection and/or diagnosis of SARS-CoV-2 by FDA under an Emergency Use Authorization (EUA). This EUA will remain in effect (meaning this test can be used) for the  duration of the COVID-19 declaration under Section 564(b)(1) of the Act, 21 U.S.C. section 360bbb-3(b)(1), unless the authorization is terminated or revoked.  Performed at [facility], [address], [place], [place] [address]   Culture, blood (routine x 2)     Status: None (Preliminary result)   Collection Time: 04/17/21  5:18 AM   Specimen: BLOOD  Result Value Ref Range Status   Specimen Description   Final    BLOOD BLOOD LEFT HAND Performed at [facility], [address], [place], [place] [address]    Special Requests   Final    BOTTLES DRAWN AEROBIC ONLY Blood Culture adequate volume Performed at [facility], [address] [place], [place] [address]    Culture   Final    NO GROWTH 2 DAYS Performed at [facility], [address], [place], [place] [address]    Report Status PENDING  Incomplete  Culture, blood (routine x 2)     Status: None (Preliminary result)   Collection Time: 04/17/21  5:18 AM   Specimen: BLOOD  Result Value Ref Range Status   Specimen Description   Final  BLOOD BLOOD LEFT FOREARM Performed at [facility], [address], [place], [place] [address]    Special Requests   Final    BOTTLES DRAWN AEROBIC ONLY Blood Culture adequate volume Performed at [facility], [address] [place], [place] [address]    Culture   Final    NO GROWTH 2 DAYS Performed at [facility], [address] [place], [place] [address]    Report Status PENDING  Incomplete    Studies/Results: No results found.  Medications: Scheduled Meds: . enoxaparin (LOVENOX) injection  40 mg Subcutaneous Q24H  . HYDROmorphone   Intravenous Q4H  . hydrOXYzine  10 mg Oral q morning   And  . hydrOXYzine  10 mg Oral QHS  . ketorolac  15 mg Intravenous Q6H  . senna-docusate  1 tablet Oral BID   Continuous Infusions: . sodium chloride 10 mL/hr at 04/19/21 1008   PRN  Meds:.diphenhydrAMINE, naloxone **AND** sodium chloride flush, ondansetron, polyethylene glycol  Consultants:  None  Procedures:  None  Antibiotics:  None  Assessment/Plan: Principal Problem:   Sickle cell pain crisis (HCC) Active Problems:   Leukocytosis   Chronic pain syndrome   Sickle cell anemia (HCC)  1. Hb Sickle Cell Disease with crisis: Reduce IVF 0.45% Saline to [facility].  Continue weight based Dilaudid PCA at current setting of 0.5/10/3, begin to wean from tomorrow.  Continue IV Toradol 15 mg Q 6 H for total of 5 days, continue home pain medications.  Monitor vitals very closely, Re-evaluate pain scale regularly, 2 L of Oxygen by [place]. 2. Leukocytosis: Steroid induced and possibly from sickle cell pain crisis. There is no evidence of infection or inflammation. We will continue to manage without antibiotics. Monitor very closely and repeat labs in AM. 3. Sickle Cell Anemia: Patient is status post blood transfusion of 1 unit of packed red blood cell, hemoglobin is now stable close to her baseline. We will continue to monitor closely and review labs in AM. 4. Chronic pain Syndrome: Patient is opiate tolerant, will continue her home pain medications. 5. Allergic reaction: Source unknown.  We will change IV Solu-Medrol to p.o. prednisone. Continue antihistamines.  Code Status: Full Code Family Communication: N/A Disposition Plan: Not yet ready for discharge  Harumi Yamin  If 7PM-7AM, please contact night-coverage.  04/19/2021, 5:34 PM  LOS: 2 days

## 2021-04-20 DIAGNOSIS — D5700 Hb-SS disease with crisis, unspecified: Secondary | ICD-10-CM | POA: Diagnosis not present

## 2021-04-20 DIAGNOSIS — G894 Chronic pain syndrome: Secondary | ICD-10-CM | POA: Diagnosis not present

## 2021-04-20 DIAGNOSIS — D72829 Elevated white blood cell count, unspecified: Secondary | ICD-10-CM | POA: Diagnosis not present

## 2021-04-20 LAB — CBC WITH DIFFERENTIAL/PLATELET
Abs Immature Granulocytes: 0.47 10*3/uL — ABNORMAL HIGH (ref 0.00–0.07)
Basophils Absolute: 0 10*3/uL (ref 0.0–0.1)
Basophils Relative: 0 %
Eosinophils Absolute: 0 10*3/uL (ref 0.0–0.5)
Eosinophils Relative: 0 %
HCT: 25.3 % — ABNORMAL LOW (ref 36.0–46.0)
Hemoglobin: 8.4 g/dL — ABNORMAL LOW (ref 12.0–15.0)
Immature Granulocytes: 2 %
Lymphocytes Relative: 34 %
Lymphs Abs: 6.6 10*3/uL — ABNORMAL HIGH (ref 0.7–4.0)
MCH: 35.3 pg — ABNORMAL HIGH (ref 26.0–34.0)
MCHC: 33.2 g/dL (ref 30.0–36.0)
MCV: 106.3 fL — ABNORMAL HIGH (ref 80.0–100.0)
Monocytes Absolute: 2.2 10*3/uL — ABNORMAL HIGH (ref 0.1–1.0)
Monocytes Relative: 12 %
Neutro Abs: 10.1 10*3/uL — ABNORMAL HIGH (ref 1.7–7.7)
Neutrophils Relative %: 52 %
Platelets: 411 10*3/uL — ABNORMAL HIGH (ref 150–400)
RBC: 2.38 MIL/uL — ABNORMAL LOW (ref 3.87–5.11)
RDW: 22.6 % — ABNORMAL HIGH (ref 11.5–15.5)
WBC: 19.4 10*3/uL — ABNORMAL HIGH (ref 4.0–10.5)
nRBC: 17.3 % — ABNORMAL HIGH (ref 0.0–0.2)

## 2021-04-20 LAB — BASIC METABOLIC PANEL
Anion gap: 6 (ref 5–15)
BUN: 10 mg/dL (ref 6–20)
CO2: 29 mmol/L (ref 22–32)
Calcium: 9.1 mg/dL (ref 8.9–10.3)
Chloride: 104 mmol/L (ref 98–111)
Creatinine, Ser: 0.45 mg/dL (ref 0.44–1.00)
GFR, Estimated: >60 mL/min (ref >60)
Glucose, Bld: 109 mg/dL — ABNORMAL HIGH (ref 70–99)
Potassium: 4.1 mmol/L (ref 3.5–5.1)
Sodium: 139 mmol/L (ref 135–145)

## 2021-04-20 NOTE — Progress Notes (Signed)
Patient ID: Kara [name], female   DOB: May 18, 1986, 35 y.o.   MRN: [id number] Subjective: Kara [name] is a 35 year old female with a medical history significant for sickle cell disease, chronic pain syndrome, opiate dependence and tolerance, and history of anemia of chronic disease that was admitted with sickle cell pain crisis.  Patient is doing a little better today.  Pain is now solid 6/10 but her goal is 3/10.  She has had bowel movement.  She has no new complaint today.  She denies any fever, cough, chest pain, nausea, vomiting or diarrhea.  No urinary symptoms.  Objective:  Vital signs in last 24 hours:  Vitals:   04/20/21 1047 04/20/21 1200 04/20/21 1541 04/20/21 1600  BP: 106/66  (!) 113/58   Pulse: 84  70   Resp: 15 12 15 17   Temp: 98.5 F (36.9 C)  98.5 F (36.9 C)   TempSrc: Oral  Oral   SpO2: 98% 100% 100% 98%  Weight:      Height:        Intake/Output from previous day:   Intake/Output Summary (Last 24 hours) at 04/20/2021 1724 Last data filed at 04/20/2021 0940 Gross per 24 hour  Intake 480 ml  Output 200 ml  Net 280 ml    Physical Exam: General: Alert, awake, oriented x3, in acute painful distress.  HEENT: [place]/AT PEERL, EOMI. Facial rashes extending to upper limbs and chest Neck: Trachea midline,  no masses, no thyromegal,y no JVD, no carotid bruit OROPHARYNX:  Moist, No exudate/ erythema/lesions.  Heart: Regular rate and rhythm, without murmurs, rubs, gallops, PMI non-displaced, no heaves or thrills on palpation.  Lungs: Clear to auscultation, no wheezing or rhonchi noted. No increased vocal fremitus resonant to percussion  Abdomen: Soft, nontender, nondistended, positive bowel sounds, no masses no hepatosplenomegaly noted..  Neuro: No focal neurological deficits noted cranial nerves II through XII grossly intact. DTRs 2+ bilaterally upper and lower extremities. Strength 5 out of 5 in bilateral upper and lower extremities. Musculoskeletal: No warm  swelling or erythema around joints, no spinal tenderness noted. Psychiatric: Patient alert and oriented x3, good insight and cognition, good recent to remote recall. Lymph node survey: No cervical axillary or inguinal lymphadenopathy noted.  Lab Results:  Basic Metabolic Panel:    Component Value Date/Time   NA 139 04/20/2021 0520   NA 138 07/14/2019 1128   K 4.1 04/20/2021 0520   CL 104 04/20/2021 0520   CO2 29 04/20/2021 0520   BUN 10 04/20/2021 0520   BUN 7 07/14/2019 1128   CREATININE 0.45 04/20/2021 0520   GLUCOSE 109 (H) 04/20/2021 0520   CALCIUM 9.1 04/20/2021 0520   CBC:    Component Value Date/Time   WBC 19.4 (H) 04/20/2021 0520   HGB 8.4 (L) 04/20/2021 0520   HGB 9.1 (L) 07/14/2019 1128   HCT 25.3 (L) 04/20/2021 0520   HCT 27.8 (L) 07/14/2019 1128   PLT 411 (H) 04/20/2021 0520   PLT 435 07/14/2019 1128   MCV 106.3 (H) 04/20/2021 0520   MCV 103 (H) 07/14/2019 1128   NEUTROABS 10.1 (H) 04/20/2021 0520   NEUTROABS 5.0 07/14/2019 1128   LYMPHSABS 6.6 (H) 04/20/2021 0520   LYMPHSABS 7.3 (H) 07/14/2019 1128   MONOABS 2.2 (H) 04/20/2021 0520   EOSABS 0.0 04/20/2021 0520   EOSABS 0.3 07/14/2019 1128   BASOSABS 0.0 04/20/2021 0520   BASOSABS 0.1 07/14/2019 1128    Recent Results (from the past 240 hour(s))  Resp Panel by  RT-PCR (Flu A&B, Covid) Nasopharyngeal Swab     Status: None   Collection Time: 04/17/21 12:14 AM   Specimen: Nasopharyngeal Swab; Nasopharyngeal(NP) swabs in vial transport medium  Result Value Ref Range Status   SARS Coronavirus 2 by RT PCR NEGATIVE NEGATIVE Final    Comment: (NOTE) SARS-CoV-2 target nucleic acids are NOT DETECTED.  The SARS-CoV-2 RNA is generally detectable in upper respiratory specimens during the acute phase of infection. The lowest concentration of SARS-CoV-2 viral copies this assay can detect is 138 copies/mL. A negative result does not preclude SARS-Cov-2 infection and should not be used as the sole basis for treatment  or other patient management decisions. A negative result may occur with  improper specimen collection/handling, submission of specimen other than nasopharyngeal swab, presence of viral mutation(s) within the areas targeted by this assay, and inadequate number of viral copies(<138 copies/mL). A negative result must be combined with clinical observations, patient history, and epidemiological information. The expected result is Negative.  Fact Sheet for Patients:  BloggerCourse.com  Fact Sheet for Healthcare Providers:  SeriousBroker.it  This test is no t yet approved or cleared by the [place] FDA and  has been authorized for detection and/or diagnosis of SARS-CoV-2 by FDA under an Emergency Use Authorization (EUA). This EUA will remain  in effect (meaning this test can be used) for the duration of the COVID-19 declaration under Section 564(b)(1) of the Act, 21 U.S.C.section 360bbb-3(b)(1), unless the authorization is terminated  or revoked sooner.       Influenza A by PCR NEGATIVE NEGATIVE Final   Influenza B by PCR NEGATIVE NEGATIVE Final    Comment: (NOTE) The Xpert Xpress SARS-CoV-2/FLU/RSV plus assay is intended as an aid in the diagnosis of influenza from Nasopharyngeal swab specimens and should not be used as a sole basis for treatment. Nasal washings and aspirates are unacceptable for Xpert Xpress SARS-CoV-2/FLU/RSV testing.  Fact Sheet for Patients: BloggerCourse.com  Fact Sheet for Healthcare Providers: SeriousBroker.it  This test is not yet approved or cleared by the [place] FDA and has been authorized for detection and/or diagnosis of SARS-CoV-2 by FDA under an Emergency Use Authorization (EUA). This EUA will remain in effect (meaning this test can be used) for the duration of the COVID-19 declaration under Section 564(b)(1) of the Act, 21 U.S.C. section  360bbb-3(b)(1), unless the authorization is terminated or revoked.  Performed at [facility], [address], [place], [place] [address]   Culture, blood (routine x 2)     Status: None (Preliminary result)   Collection Time: 04/17/21  5:18 AM   Specimen: BLOOD  Result Value Ref Range Status   Specimen Description   Final    BLOOD BLOOD LEFT HAND Performed at [facility], [address] [place], [place] [address]    Special Requests   Final    BOTTLES DRAWN AEROBIC ONLY Blood Culture adequate volume Performed at [facility], [address] [place], [place] [address]    Culture   Final    NO GROWTH 3 DAYS Performed at [facility], [address], [place], [place] [address]    Report Status PENDING  Incomplete  Culture, blood (routine x 2)     Status: None (Preliminary result)   Collection Time: 04/17/21  5:18 AM   Specimen: BLOOD  Result Value Ref Range Status   Specimen Description   Final    BLOOD BLOOD LEFT FOREARM Performed at [facility],  [address] [place], [place] [address]    Special Requests   Final    BOTTLES DRAWN AEROBIC ONLY Blood Culture adequate volume Performed at [facility], [address], [place], [place] [address]    Culture   Final    NO GROWTH 3 DAYS Performed at [facility], [address] [place], [place] [address]    Report Status PENDING  Incomplete    Studies/Results: No results found.  Medications: Scheduled Meds: . enoxaparin (LOVENOX) injection  40 mg Subcutaneous Q24H  . HYDROmorphone   Intravenous Q4H  . hydrOXYzine  10 mg Oral q morning   And  . hydrOXYzine  10 mg Oral QHS  . ketorolac  15 mg Intravenous Q6H  . predniSONE  20 mg Oral BID WC  . senna-docusate  1 tablet Oral BID   Continuous Infusions: . sodium chloride 10 mL/hr at 04/19/21 1008   PRN Meds:.diphenhydrAMINE, naloxone **AND** sodium chloride  flush, ondansetron, polyethylene glycol  Consultants:  None  Procedures:  None  Antibiotics:  None  Assessment/Plan: Principal Problem:   Sickle cell pain crisis (HCC) Active Problems:   Leukocytosis   Chronic pain syndrome   Sickle cell anemia (HCC)  1. Hb Sickle Cell Disease with crisis: Continue IVF at [facility], begin to wean continue weight based Dilaudid PCA slowly, continue IV Toradol 15 mg Q 6 H for total of 5 days, continue home pain medications. Monitor vitals very closely, Re-evaluate pain scale regularly, 2 L of Oxygen by . 2. Leukocytosis: Most likely reactive and from high-dose steroid. There is no evidence of infection or inflammation. We will continue to manage without antibiotics. Monitor very closely and repeat labs in AM. 3. Sickle Cell Anemia: Patient is status post blood transfusion of 1 unit of packed red blood cell, hemoglobin is now stable at baseline, 8.4 today. There is no clinical indication for blood transfusion today. We will continue to monitor closely and review labs in AM. 4. Chronic pain Syndrome: Patient is opiate tolerant, will continue her home pain medications. 5. Allergic reaction: Source unknown. Continue prednisone as ordered. Continue antihistamines.  Code Status: Full Code Family Communication: N/A Disposition Plan: Not yet ready for discharge  Kara [name]  If 7PM-7AM, please contact night-coverage.  04/20/2021, 5:24 PM  LOS: 3 days

## 2021-04-20 NOTE — Care Management Important Message (Signed)
Important Message  Patient Details IM Letter given to the Patient. Name: Kara [name] MRN: [id number] Date of Birth: [birth year]   Medicare Important Message Given:  Yes     Caren Macadam 04/20/2021, 8:49 AM

## 2021-04-21 NOTE — Progress Notes (Signed)
Subjective: Patient is a 35 year old female with known history of sickle cell disease who is here with sickle cell painful crisis.  Patient's pain is down to 6 out of 10.  No fever or chills no nausea vomiting or diarrhea.  She is taking her medications from home.  She has been able to walk to the bathroom on around the room.  Currently on Dilaudid PCA Toradol and IV fluids.  Objective: Vital signs in last 24 hours: Temp:  [98.2 F (36.8 C)-98.5 F (36.9 C)] 98.3 F (36.8 C) (06/02 0441) Pulse Rate:  [57-84] 74 (06/02 0441) Resp:  [12-17] 15 (06/02 0832) BP: (106-118)/(58-66) 109/66 (06/02 0441) SpO2:  [96 %-100 %] 96 % (06/02 0832) FiO2 (%):  [0 %] 0 % (06/01 1600) Weight change:  Last BM Date: 04/20/21  Intake/Output from previous day: 06/01 0701 - 06/02 0700 In: 840 [P.O.:840] Out: -  Intake/Output this shift: No intake/output data recorded.  General appearance: alert, cooperative, appears stated age and no distress Back: symmetric, no curvature. ROM normal. No CVA tenderness. Cardio: regular rate and rhythm, S1, S2 normal, no murmur, click, rub or gallop GI: soft, non-tender; bowel sounds normal; no masses,  no organomegaly Extremities: extremities normal, atraumatic, no cyanosis or edema Pulses: 2+ and symmetric Skin: Skin color, texture, turgor normal. No rashes or lesions Neurologic: Grossly normal  Lab Results: Recent Labs    04/19/21 0514 04/20/21 0520  WBC 16.3* 19.4*  HGB 7.9* 8.4*  HCT 23.6* 25.3*  PLT 416* 411*   BMET Recent Labs    04/20/21 0520  NA 139  K 4.1  CL 104  CO2 29  GLUCOSE 109*  BUN 10  CREATININE 0.45  CALCIUM 9.1    Studies/Results: No results found.  Medications: I have reviewed the patient's current medications.  Assessment/Plan: A 35 year old female here with sickle cell painful crisis.  #1 sickle cell painful crisis: Patient's pain is improving but not completely resolved.  We will continue with Dilaudid PCA, no Toradol  but on IV fluids.  I will start her oral pain medications in preparation for possible discharge.  #2 sickle cell anemia: Hemoglobin stable at 8.4.  Continue monitoring.  #3 leukocytosis: Secondary to vaso-occlusive crisis.  Continue to monitor white count.  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  Continue to monitor.  LOS: 4 days   Dennie Vecchio,LAWAL 04/21/2021, 9:42 AM

## 2021-04-22 LAB — COMPREHENSIVE METABOLIC PANEL
ALT: 22 U/L (ref 0–44)
AST: 22 U/L (ref 15–41)
Albumin: 3.7 g/dL (ref 3.5–5.0)
Alkaline Phosphatase: 38 U/L (ref 38–126)
Anion gap: 9 (ref 5–15)
BUN: 10 mg/dL (ref 6–20)
CO2: 30 mmol/L (ref 22–32)
Calcium: 9.1 mg/dL (ref 8.9–10.3)
Chloride: 100 mmol/L (ref 98–111)
Creatinine, Ser: 0.36 mg/dL — ABNORMAL LOW (ref 0.44–1.00)
GFR, Estimated: >60 mL/min (ref >60)
Glucose, Bld: 101 mg/dL — ABNORMAL HIGH (ref 70–99)
Potassium: 3.5 mmol/L (ref 3.5–5.1)
Sodium: 139 mmol/L (ref 135–145)
Total Bilirubin: 3.8 mg/dL — ABNORMAL HIGH (ref 0.3–1.2)
Total Protein: 6.8 g/dL (ref 6.5–8.1)

## 2021-04-22 LAB — CBC WITH DIFFERENTIAL/PLATELET
Abs Immature Granulocytes: 0.27 10*3/uL — ABNORMAL HIGH (ref 0.00–0.07)
Basophils Absolute: 0.1 10*3/uL (ref 0.0–0.1)
Basophils Relative: 0 %
Eosinophils Absolute: 0.1 10*3/uL (ref 0.0–0.5)
Eosinophils Relative: 0 %
HCT: 24.7 % — ABNORMAL LOW (ref 36.0–46.0)
Hemoglobin: 8.2 g/dL — ABNORMAL LOW (ref 12.0–15.0)
Immature Granulocytes: 1 %
Lymphocytes Relative: 54 %
Lymphs Abs: 14.3 10*3/uL — ABNORMAL HIGH (ref 0.7–4.0)
MCH: 34.9 pg — ABNORMAL HIGH (ref 26.0–34.0)
MCHC: 33.2 g/dL (ref 30.0–36.0)
MCV: 105.1 fL — ABNORMAL HIGH (ref 80.0–100.0)
Monocytes Absolute: 2.5 10*3/uL — ABNORMAL HIGH (ref 0.1–1.0)
Monocytes Relative: 9 %
Neutro Abs: 9.7 10*3/uL — ABNORMAL HIGH (ref 1.7–7.7)
Neutrophils Relative %: 36 %
Platelets: 412 10*3/uL — ABNORMAL HIGH (ref 150–400)
RBC: 2.35 MIL/uL — ABNORMAL LOW (ref 3.87–5.11)
RDW: 19.9 % — ABNORMAL HIGH (ref 11.5–15.5)
WBC: 26.9 10*3/uL — ABNORMAL HIGH (ref 4.0–10.5)
nRBC: 4.4 % — ABNORMAL HIGH (ref 0.0–0.2)

## 2021-04-22 LAB — CULTURE, BLOOD (ROUTINE X 2)
Culture: NO GROWTH
Culture: NO GROWTH
Special Requests: ADEQUATE
Special Requests: ADEQUATE

## 2021-04-22 MED ORDER — METHOCARBAMOL 500 MG PO TABS
500.0000 mg | ORAL_TABLET | Freq: Three times a day (TID) | ORAL | Status: DC
  Administered 2021-04-22 – 2021-04-23 (×4): 500 mg via ORAL
  Filled 2021-04-22 (×4): qty 1

## 2021-04-22 MED ORDER — FOLIC ACID 1 MG PO TABS
1.0000 mg | ORAL_TABLET | Freq: Every day | ORAL | Status: DC
  Administered 2021-04-22 – 2021-04-23 (×2): 1 mg via ORAL
  Filled 2021-04-22 (×3): qty 1

## 2021-04-22 MED ORDER — FAMOTIDINE 20 MG PO TABS
20.0000 mg | ORAL_TABLET | Freq: Two times a day (BID) | ORAL | Status: DC
  Filled 2021-04-22 (×3): qty 1

## 2021-04-22 MED ORDER — OXYCODONE HCL ER 10 MG PO T12A
10.0000 mg | EXTENDED_RELEASE_TABLET | Freq: Two times a day (BID) | ORAL | Status: DC
  Administered 2021-04-22 – 2021-04-23 (×3): 10 mg via ORAL
  Filled 2021-04-22 (×4): qty 1

## 2021-04-22 MED ORDER — BUTALBITAL-APAP-CAFFEINE 50-325-40 MG PO TABS: 1.0000 | ORAL_TABLET | Freq: Four times a day (QID) | ORAL | Status: DC | PRN

## 2021-04-22 MED ORDER — NAPROXEN 500 MG PO TABS: 500.0000 mg | ORAL_TABLET | Freq: Two times a day (BID) | ORAL | Status: DC | PRN

## 2021-04-22 MED ORDER — LORATADINE 10 MG PO TABS
10.0000 mg | ORAL_TABLET | Freq: Every day | ORAL | Status: DC
  Filled 2021-04-22 (×3): qty 1

## 2021-04-22 MED ORDER — PANTOPRAZOLE SODIUM 40 MG PO TBEC
40.0000 mg | DELAYED_RELEASE_TABLET | Freq: Every day | ORAL | Status: DC
  Administered 2021-04-22: 40 mg via ORAL
  Filled 2021-04-22 (×3): qty 1

## 2021-04-22 NOTE — Progress Notes (Signed)
Subjective: Patient is still having 6 out of 10 pain.  Was only restarted on her oral medications yesterday.  She feels a little better.  Was able to walk to the bathroom today.  No fever or chills no nausea vomiting or diarrhea.  Objective: Vital signs in last 24 hours: Temp:  [98.3 F (36.8 C)-99 F (37.2 C)] 99 F (37.2 C) (06/03 2144) Pulse Rate:  [71-108] 83 (06/03 2144) Resp:  [14-18] 14 (06/03 2144) BP: (106-114)/(56-79) 114/67 (06/03 2144) SpO2:  [94 %-100 %] 100 % (06/03 2144) Weight:  [56.2 kg] 56.2 kg (06/03 0524) Weight change:  Last BM Date: 04/16/21  Intake/Output from previous day: No intake/output data recorded. Intake/Output this shift: No intake/output data recorded.  General appearance: alert, cooperative, appears stated age and no distress Back: symmetric, no curvature. ROM normal. No CVA tenderness. Cardio: regular rate and rhythm, S1, S2 normal, no murmur, click, rub or gallop GI: soft, non-tender; bowel sounds normal; no masses,  no organomegaly Extremities: extremities normal, atraumatic, no cyanosis or edema Pulses: 2+ and symmetric Skin: Skin color, texture, turgor normal. No rashes or lesions Neurologic: Grossly normal  Lab Results: Recent Labs    04/20/21 0520 04/22/21 1042  WBC 19.4* 26.9*  HGB 8.4* 8.2*  HCT 25.3* 24.7*  PLT 411* 412*   BMET Recent Labs    04/20/21 0520 04/22/21 1042  NA 139 139  K 4.1 3.5  CL 104 100  CO2 29 30  GLUCOSE 109* 101*  BUN 10 10  CREATININE 0.45 0.36*  CALCIUM 9.1 9.1    Studies/Results: No results found.  Medications: I have reviewed the patient's current medications.  Assessment/Plan: A 35 year old female here with sickle cell painful crisis.  #1 sickle cell painful crisis: Patient's pain is improving but not completely resolved.  We will continue with Dilaudid PCA, no Toradol but on IV fluids.  I will continue her oral pain medications in preparation for possible discharge tomorrow.  #2  sickle cell anemia: Hemoglobin stable at 8.4.  Continue monitoring.  #3 leukocytosis: Secondary to vaso-occlusive crisis.  White count has worsened to 26,000 today.  She is however feeling better clinically.  Continue to monitor white count.  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  Continue to monitor.  LOS: 5 days   Tyrhonda Georgiades,LAWAL 04/22/2021, 9:50 PM

## 2021-04-23 MED ORDER — OXYCODONE HCL 15 MG PO TABS
15.0000 mg | ORAL_TABLET | ORAL | 0 refills | Status: DC | PRN
Start: 2021-04-23 — End: 2021-05-05

## 2021-04-23 MED ORDER — OXYCODONE HCL 15 MG PO TABS: 15.0000 mg | ORAL_TABLET | ORAL | 0 refills | Status: DC | PRN

## 2021-04-23 MED ORDER — OXYCONTIN 10 MG PO T12A: 10.0000 mg | EXTENDED_RELEASE_TABLET | Freq: Three times a day (TID) | ORAL | 0 refills | Status: DC | PRN

## 2021-04-23 MED ORDER — HEPARIN SOD (PORK) LOCK FLUSH 100 UNIT/ML IV SOLN: 500.0000 [IU] | Freq: Once | INTRAVENOUS | Status: DC

## 2021-04-23 NOTE — Discharge Summary (Addendum)
Physician Discharge Summary  Patient ID: Kara [name] MRN: [id number] DOB/AGE: Aug 03, 1986 35 y.o.  Admit date: 04/16/2021 Discharge date: 04/23/2021  Admission Diagnoses:  Discharge Diagnoses:  Principal Problem:   Sickle cell pain crisis (HCC) Active Problems:   Leukocytosis   Chronic pain syndrome   Sickle cell anemia (HCC)   Discharged Condition: good  Hospital Course: Patient admitted with sickle cell painful crisis.  Also known chronic pain syndrome with persistent leukocytosis and thrombocytosis due to sickle cell anemia.  Was admitted and treated with Dilaudid PCA, Toradol, and IV fluids.  Patient also maintained on her long-acting home medications including oxycodone long-acting and short acting.  She did better gradually.  Initially was not able to walk around but was able to walk to the bathroom and then to the floors prior to discharge.  On the day of discharge patient was feeling better.  Wound was discharged home to follow-up with PCP  Consults: None  Significant Diagnostic Studies: labs: Serial CBCs and CMP's were checked.  Patient did not require any blood transfusion    Treatments: IV hydration and analgesia: Dilaudid  Discharge Exam: Blood pressure 133/78, pulse 88, temperature 98.6 F (37 C), temperature source Oral, resp. rate 20, height 5\' 3"  (1.6 m), weight 54.7 kg, SpO2 100 %. General appearance: alert, cooperative, appears stated age and no distress Cardio: regular rate and rhythm, S1, S2 normal, no murmur, click, rub or gallop GI: soft, non-tender; bowel sounds normal; no masses,  no organomegaly Extremities: extremities normal, atraumatic, no cyanosis or edema Pulses: 2+ and symmetric Neurologic: Grossly normal  Disposition: Discharge disposition: 01-Home or Self Care       Discharge Instructions     Diet - low sodium heart healthy   Complete by: As directed    Increase activity slowly   Complete by: As directed       Allergies as  of 04/23/2021       Reactions   Kiwi Extract Hives   Morphine And Related Hives   Paroxetine Hives        Medication List     TAKE these medications    butalbital-acetaminophen-caffeine 50-325-40 MG tablet Commonly known as: FIORICET Take 1 tablet by mouth every 6 (six) hours as needed for headache.   cetirizine 10 MG tablet Commonly known as: ZYRTEC Take 1 tablet (10 mg total) by mouth daily. What changed:  when to take this reasons to take this   Ensure Plus Liqd Take 237 mLs by mouth 3 (three) times daily between meals.   famotidine 20 MG tablet Commonly known as: PEPCID Take 1 tablet (20 mg total) by mouth 2 (two) times daily.   folic acid 1 MG tablet Commonly known as: FOLVITE Take 1 tablet (1 mg total) by mouth daily.   methocarbamol 500 MG tablet Commonly known as: ROBAXIN Take 1 tablet (500 mg total) by mouth in the morning and at bedtime. What changed: when to take this   naproxen 500 MG tablet Commonly known as: NAPROSYN Take 1 tablet (500 mg total) by mouth 2 (two) times daily with a meal. What changed:  when to take this reasons to take this   omeprazole 40 MG capsule Commonly known as: PRILOSEC Take 1 capsule (40 mg total) by mouth daily.   oxyCODONE 15 MG immediate release tablet Commonly known as: ROXICODONE Take 1 tablet (15 mg total) by mouth every 4 (four) hours as needed for up to 15 days. What changed: Another medication with the same name  was changed. Make sure you understand how and when to take each.   OxyCONTIN 10 mg 12 hr tablet Generic drug: oxyCODONE Take 1 tablet (10 mg total) by mouth every 8 (eight) hours as needed. What changed: reasons to take this   predniSONE 10 MG (21) Tbpk tablet Commonly known as: STERAPRED UNI-PAK 21 TAB Take by mouth daily. Take 6 tabs by mouth daily  for 2 days, then 5 tabs for 2 days, then 4 tabs for 2 days, then 3 tabs for 2 days, 2 tabs for 2 days, then 1 tab by mouth daily for 2 days What  changed:  how much to take when to take this additional instructions   Vitamin D (Ergocalciferol) 1.25 MG (50000 UNIT) Caps capsule Commonly known as: DRISDOL Take 1 capsule (50,000 Units total) by mouth every 7 (seven) days.         SignedLonia Blood 04/25/2021, 7:51 PM   Time spent 34 minutes

## 2021-04-23 NOTE — Progress Notes (Signed)
Pt discharged with all belongings. Discharge education completed, refills called into pt's pharmacy by MD.

## 2021-04-25 NOTE — Telephone Encounter (Signed)
error 

## 2021-04-28 ENCOUNTER — Emergency Department (HOSPITAL_COMMUNITY): Payer: Medicare Other

## 2021-04-28 ENCOUNTER — Inpatient Hospital Stay (HOSPITAL_COMMUNITY)
Admission: EM | Admit: 2021-04-28 | Discharge: 2021-05-05 | DRG: 812 | Disposition: A | Discharge status: Home / Self Care | Payer: Medicare Other | Attending: Internal Medicine | Admitting: Internal Medicine

## 2021-04-28 ENCOUNTER — Encounter (HOSPITAL_COMMUNITY): Payer: Self-pay

## 2021-04-28 ENCOUNTER — Other Ambulatory Visit: Payer: Self-pay

## 2021-04-28 ENCOUNTER — Telehealth (HOSPITAL_COMMUNITY): Payer: Self-pay | Admitting: *Deleted

## 2021-04-28 DIAGNOSIS — Z885 Allergy status to narcotic agent status: Secondary | ICD-10-CM

## 2021-04-28 DIAGNOSIS — Z79899 Other long term (current) drug therapy: Secondary | ICD-10-CM

## 2021-04-28 DIAGNOSIS — G43909 Migraine, unspecified, not intractable, without status migrainosus: Secondary | ICD-10-CM | POA: Diagnosis present

## 2021-04-28 DIAGNOSIS — D638 Anemia in other chronic diseases classified elsewhere: Secondary | ICD-10-CM | POA: Diagnosis present

## 2021-04-28 DIAGNOSIS — F1190 Opioid use, unspecified, uncomplicated: Secondary | ICD-10-CM

## 2021-04-28 DIAGNOSIS — M79602 Pain in left arm: Secondary | ICD-10-CM

## 2021-04-28 DIAGNOSIS — D5700 Hb-SS disease with crisis, unspecified: Secondary | ICD-10-CM | POA: Diagnosis not present

## 2021-04-28 DIAGNOSIS — F411 Generalized anxiety disorder: Secondary | ICD-10-CM | POA: Diagnosis present

## 2021-04-28 DIAGNOSIS — Z888 Allergy status to other drugs, medicaments and biological substances status: Secondary | ICD-10-CM

## 2021-04-28 DIAGNOSIS — F17210 Nicotine dependence, cigarettes, uncomplicated: Secondary | ICD-10-CM | POA: Diagnosis present

## 2021-04-28 DIAGNOSIS — G894 Chronic pain syndrome: Secondary | ICD-10-CM | POA: Diagnosis present

## 2021-04-28 DIAGNOSIS — D571 Sickle-cell disease without crisis: Secondary | ICD-10-CM

## 2021-04-28 DIAGNOSIS — D72829 Elevated white blood cell count, unspecified: Secondary | ICD-10-CM | POA: Diagnosis present

## 2021-04-28 DIAGNOSIS — Z720 Tobacco use: Secondary | ICD-10-CM | POA: Diagnosis present

## 2021-04-28 DIAGNOSIS — Z20822 Contact with and (suspected) exposure to covid-19: Secondary | ICD-10-CM | POA: Diagnosis present

## 2021-04-28 DIAGNOSIS — I272 Pulmonary hypertension, unspecified: Secondary | ICD-10-CM | POA: Diagnosis present

## 2021-04-28 DIAGNOSIS — G8929 Other chronic pain: Secondary | ICD-10-CM

## 2021-04-28 DIAGNOSIS — Z9102 Food additives allergy status: Secondary | ICD-10-CM

## 2021-04-28 DIAGNOSIS — K219 Gastro-esophageal reflux disease without esophagitis: Secondary | ICD-10-CM | POA: Diagnosis present

## 2021-04-28 DIAGNOSIS — F1120 Opioid dependence, uncomplicated: Secondary | ICD-10-CM | POA: Diagnosis present

## 2021-04-28 LAB — COMPREHENSIVE METABOLIC PANEL
ALT: 18 U/L (ref 0–44)
AST: 18 U/L (ref 15–41)
Albumin: 4.2 g/dL (ref 3.5–5.0)
Alkaline Phosphatase: 48 U/L (ref 38–126)
Anion gap: 9 (ref 5–15)
BUN: 8 mg/dL (ref 6–20)
CO2: 26 mmol/L (ref 22–32)
Calcium: 8.7 mg/dL — ABNORMAL LOW (ref 8.9–10.3)
Chloride: 103 mmol/L (ref 98–111)
Creatinine, Ser: 0.5 mg/dL (ref 0.44–1.00)
GFR, Estimated: >60 mL/min (ref >60)
Glucose, Bld: 116 mg/dL — ABNORMAL HIGH (ref 70–99)
Potassium: 3 mmol/L — ABNORMAL LOW (ref 3.5–5.1)
Sodium: 138 mmol/L (ref 135–145)
Total Bilirubin: 4 mg/dL — ABNORMAL HIGH (ref 0.3–1.2)
Total Protein: 7 g/dL (ref 6.5–8.1)

## 2021-04-28 LAB — CBC WITH DIFFERENTIAL/PLATELET
Abs Immature Granulocytes: 0.23 10*3/uL — ABNORMAL HIGH (ref 0.00–0.07)
Basophils Absolute: 0.1 10*3/uL (ref 0.0–0.1)
Basophils Relative: 0 %
Eosinophils Absolute: 0.2 10*3/uL (ref 0.0–0.5)
Eosinophils Relative: 1 %
HCT: 26.9 % — ABNORMAL LOW (ref 36.0–46.0)
Hemoglobin: 8.9 g/dL — ABNORMAL LOW (ref 12.0–15.0)
Immature Granulocytes: 1 %
Lymphocytes Relative: 43 %
Lymphs Abs: 8.9 10*3/uL — ABNORMAL HIGH (ref 0.7–4.0)
MCH: 36 pg — ABNORMAL HIGH (ref 26.0–34.0)
MCHC: 33.1 g/dL (ref 30.0–36.0)
MCV: 108.9 fL — ABNORMAL HIGH (ref 80.0–100.0)
Monocytes Absolute: 2.2 10*3/uL — ABNORMAL HIGH (ref 0.1–1.0)
Monocytes Relative: 11 %
Neutro Abs: 9 10*3/uL — ABNORMAL HIGH (ref 1.7–7.7)
Neutrophils Relative %: 44 %
Platelets: 369 10*3/uL (ref 150–400)
RBC: 2.47 MIL/uL — ABNORMAL LOW (ref 3.87–5.11)
RDW: 21.6 % — ABNORMAL HIGH (ref 11.5–15.5)
WBC: 20.6 10*3/uL — ABNORMAL HIGH (ref 4.0–10.5)
nRBC: 8.5 % — ABNORMAL HIGH (ref 0.0–0.2)

## 2021-04-28 LAB — I-STAT BETA HCG BLOOD, ED (MC, WL, AP ONLY): I-stat hCG, quantitative: <5 m[IU]/mL (ref <5)

## 2021-04-28 LAB — RETICULOCYTES
Immature Retic Fract: 41.9 % — ABNORMAL HIGH (ref 2.3–15.9)
RBC.: 2.44 MIL/uL — ABNORMAL LOW (ref 3.87–5.11)
Retic Count, Absolute: 586.3 10*3/uL — ABNORMAL HIGH (ref 19.0–186.0)
Retic Ct Pct: 24 % — ABNORMAL HIGH (ref 0.4–3.1)

## 2021-04-28 MED ORDER — FAMOTIDINE 20 MG PO TABS
20.0000 mg | ORAL_TABLET | Freq: Two times a day (BID) | ORAL | Status: DC
  Filled 2021-04-28 (×10): qty 1

## 2021-04-28 MED ORDER — POLYETHYLENE GLYCOL 3350 17 G PO PACK
17.0000 g | PACK | Freq: Every day | ORAL | Status: DC | PRN
  Administered 2021-05-01: 17 g via ORAL
  Filled 2021-04-28: qty 1

## 2021-04-28 MED ORDER — HYDROMORPHONE HCL 2 MG/ML IJ SOLN
2.0000 mg | INTRAMUSCULAR | Status: AC
  Administered 2021-04-28: 2 mg via INTRAVENOUS
  Filled 2021-04-28: qty 1

## 2021-04-28 MED ORDER — DIPHENHYDRAMINE HCL 25 MG PO CAPS: 25.0000 mg | ORAL_CAPSULE | ORAL | Status: DC | PRN

## 2021-04-28 MED ORDER — SENNOSIDES-DOCUSATE SODIUM 8.6-50 MG PO TABS
1.0000 | ORAL_TABLET | Freq: Two times a day (BID) | ORAL | Status: DC
  Administered 2021-04-28 – 2021-05-05 (×13): 1 via ORAL
  Filled 2021-04-28 (×14): qty 1

## 2021-04-28 MED ORDER — KETOROLAC TROMETHAMINE 15 MG/ML IJ SOLN
15.0000 mg | INTRAMUSCULAR | Status: AC
  Administered 2021-04-28: 15 mg via INTRAVENOUS
  Filled 2021-04-28: qty 1

## 2021-04-28 MED ORDER — METHOCARBAMOL 500 MG PO TABS
500.0000 mg | ORAL_TABLET | Freq: Four times a day (QID) | ORAL | Status: DC | PRN
  Administered 2021-05-03: 500 mg via ORAL
  Filled 2021-04-28: qty 1

## 2021-04-28 MED ORDER — SODIUM CHLORIDE 0.9 % IV SOLN
25.0000 mg | INTRAVENOUS | Status: DC | PRN
  Filled 2021-04-28: qty 0.5

## 2021-04-28 MED ORDER — ONDANSETRON HCL 4 MG/2ML IJ SOLN
4.0000 mg | INTRAMUSCULAR | Status: DC | PRN
  Administered 2021-04-28: 4 mg via INTRAVENOUS
  Filled 2021-04-28: qty 2

## 2021-04-28 MED ORDER — ENOXAPARIN SODIUM 40 MG/0.4ML IJ SOSY
40.0000 mg | PREFILLED_SYRINGE | INTRAMUSCULAR | Status: DC
  Administered 2021-04-28: 40 mg via SUBCUTANEOUS
  Filled 2021-04-28 (×4): qty 0.4

## 2021-04-28 MED ORDER — DIPHENHYDRAMINE HCL 50 MG/ML IJ SOLN
25.0000 mg | Freq: Once | INTRAMUSCULAR | Status: AC
  Administered 2021-04-28: 25 mg via INTRAVENOUS
  Filled 2021-04-28: qty 1

## 2021-04-28 MED ORDER — FOLIC ACID 1 MG PO TABS
1.0000 mg | ORAL_TABLET | Freq: Every day | ORAL | Status: DC
  Administered 2021-04-28 – 2021-05-05 (×8): 1 mg via ORAL
  Filled 2021-04-28 (×8): qty 1

## 2021-04-28 MED ORDER — ONDANSETRON HCL 4 MG/2ML IJ SOLN: 4.0000 mg | Freq: Four times a day (QID) | INTRAMUSCULAR | Status: DC | PRN

## 2021-04-28 MED ORDER — OXYCODONE HCL ER 10 MG PO T12A
10.0000 mg | EXTENDED_RELEASE_TABLET | Freq: Two times a day (BID) | ORAL | Status: DC
  Administered 2021-04-28 – 2021-05-02 (×9): 10 mg via ORAL
  Filled 2021-04-28 (×9): qty 1

## 2021-04-28 MED ORDER — HYDROMORPHONE HCL 2 MG/ML IJ SOLN
2.0000 mg | INTRAMUSCULAR | Status: AC
Start: 2021-04-28 — End: 2021-04-28
  Administered 2021-04-28: 2 mg via INTRAVENOUS
  Filled 2021-04-28: qty 1

## 2021-04-28 MED ORDER — DEXTROSE-NACL 5-0.45 % IV SOLN: INTRAVENOUS | Status: DC

## 2021-04-28 MED ORDER — HYDROMORPHONE 1 MG/ML IV SOLN
INTRAVENOUS | Status: DC
  Administered 2021-04-28: 3 mg via INTRAVENOUS
  Administered 2021-04-28: 30 mg via INTRAVENOUS
  Administered 2021-04-29: 5 mg via INTRAVENOUS
  Administered 2021-04-29: 0.5 mg via INTRAVENOUS
  Administered 2021-04-29 (×2): 3.5 mg via INTRAVENOUS
  Administered 2021-04-29: 1.5 mg via INTRAVENOUS
  Administered 2021-04-29: 0.5 mg via INTRAVENOUS
  Administered 2021-04-30: 7.5 mg via INTRAVENOUS
  Administered 2021-04-30: 30 mg via INTRAVENOUS
  Administered 2021-04-30: 2 mg via INTRAVENOUS
  Administered 2021-04-30: 4 mg via INTRAVENOUS
  Administered 2021-04-30: 5 mg via INTRAVENOUS
  Administered 2021-04-30: 5.5 mg via INTRAVENOUS
  Administered 2021-05-01: 4.5 mg via INTRAVENOUS
  Administered 2021-05-01: 2 mg via INTRAVENOUS
  Administered 2021-05-01: 30 mg via INTRAVENOUS
  Administered 2021-05-01: 0.5 mg via INTRAVENOUS
  Administered 2021-05-01: 4.5 mg via INTRAVENOUS
  Filled 2021-04-28 (×3): qty 30

## 2021-04-28 MED ORDER — NALOXONE HCL 0.4 MG/ML IJ SOLN: 0.4000 mg | INTRAMUSCULAR | Status: DC | PRN

## 2021-04-28 MED ORDER — SODIUM CHLORIDE 0.9% FLUSH: 9.0000 mL | INTRAVENOUS | Status: DC | PRN

## 2021-04-28 MED ORDER — KETOROLAC TROMETHAMINE 15 MG/ML IJ SOLN
15.0000 mg | Freq: Four times a day (QID) | INTRAMUSCULAR | Status: AC
  Administered 2021-04-28 – 2021-05-03 (×20): 15 mg via INTRAVENOUS
  Filled 2021-04-28 (×20): qty 1

## 2021-04-28 NOTE — Telephone Encounter (Signed)
Patient called requesting to come to the day hospital for sickle cell pain crisis. Patient reports left arm and bilateral foot pain rated 10/10.  Also report generalized numbness. Patient last took pain medications last night but reports that she is now out of medication. Patient was recently admitted and discharged on last Saturday.  Patient expresses that she feels she needs readmission. COVID-19 screening done and patient denies all symptoms and exposures. [place], FNP notified.  Due to patient's generalized numbness and possible need for readmission, patient advised to go to the ED for workup.  Patient expresses an understanding.

## 2021-04-28 NOTE — ED Provider Notes (Signed)
Emergency Medicine Provider Triage Evaluation Note  Kara [name] , a 35 y.o. female  was evaluated in triage.  Pt complains of diffuse pain secondary sickle cell crisis that has been ongoing since 5/23. She was just discharged from the hospital and states that her symptoms have not improved since she was discharged.  Review of Systems  Positive: Diffuse body pain, sob (chronic from smoking, unchanged) Negative: Chest pain  Physical Exam  BP (!) 145/73 (BP Location: Left Arm)   Pulse (!) 110   Temp 98.7 F (37.1 C) (Oral)   Resp 16   Ht 5\' 5"  (1.651 m)   Wt 54.9 kg   LMP 04/22/2021 (Approximate)   SpO2 99%   BMI 20.14 kg/m  Gen:   Awake, no distress   Resp:  Normal effort  MSK:   Moves extremities without difficulty  Other:    Medical Decision Making  Medically screening exam initiated at 12:18 PM.  Appropriate orders placed.  06/22/2021 was informed that the remainder of the evaluation will be completed by another provider, this initial triage assessment does not replace that evaluation, and the importance of remaining in the ED until their evaluation is complete.     [name], PA-C 04/28/21 1221    06/28/21, MD 04/28/21 1318

## 2021-04-28 NOTE — ED Triage Notes (Signed)
Patient c/o sickle cell pain "everywhere." Patient states she was discharged from the hospital 5 days ago and states her homes meds are not helping.

## 2021-04-28 NOTE — ED Provider Notes (Signed)
[facility]-EMERGENCY DEPT Provider Note   CSN: [id number] Arrival date & time: 04/28/21  1045     History Chief Complaint  Patient presents with   Sickle Cell Pain Crisis    Kara [name] is a 35 y.o. female.  HPI Patient reports has had ongoing pain with sickle cell crisis since May.  She was hospitalized and discharged earlier this week.  She reports the pain has been persistent.  She reports is the same pain.  She endorses generalized aching throughout all of her body.  He also reports that she feels like she is tingling throughout all of her body.  No focal area of weakness or extremity dysfunction.  No visual changes.  No fevers.  No nausea no vomiting.  No shortness of breath no cough.  She reports she is taking her prescribed home medications but they do not help.    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 04/13/2020   Sickle cell anemia with pain (HCC) 12/08/2019   Decreased appetite 08/28/2019   Chronic pain of right knee 08/02/19   Gastroesophageal reflux disease without esophagitis 08-02-19   Anxiety 08/02/2019   Death of family member 08-02-19   Chronic, continuous use of opioids 08/02/19   Headache, new daily persistent ([facility]) 07/04/2019   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   Chronic pain syndrome 12/27/2018   Anemia of chronic disease 12/27/2018   Pulmonary  hypertension (HCC) 04/04/2018   Sickle cell disease (HCC) 04/04/2018   Arthritis 04/04/2018   Chest pain on breathing    Leukocytosis 02/06/2018   Sickle cell crisis (HCC) 02/05/2018   Sickle cell pain crisis (HCC) 10/29/2017   Other social stressor 04/28/2017   Mood disorder (HCC) 03/10/2017   Complex care coordination 06/02/2016   Tobacco abuse 07/07/2012   Contraception 12/08/2010    Past Surgical History:  Procedure Laterality Date   CHOLECYSTECTOMY     DILATION AND CURETTAGE OF UTERUS     GALLBLADDER SURGERY       OB History     Gravida  1   Para      Term      Preterm      AB  1   Living         SAB  1   IAB      Ectopic      Multiple      Live Births              Family History  Adopted: Yes    Social History   Tobacco Use   Smoking status: Every Day    Packs/day: 0.50    Pack years: 0.00    Types: Cigarettes   Smokeless tobacco: Never  Vaping Use   Vaping Use: Never used  Substance Use Topics   Alcohol use: No   Drug use: Yes  Types: Marijuana    Home Medications Prior to Admission medications   Medication Sig Start Date End Date Taking? Authorizing Provider  butalbital-acetaminophen-caffeine (FIORICET) 50-325-40 MG tablet Take 1 tablet by mouth every 6 (six) hours as needed for headache. 06/11/20  Yes Kallie LocksStroud, Natalie M, FNP  cetirizine (ZYRTEC) 10 MG tablet Take 1 tablet (10 mg total) by mouth daily. Patient taking differently: Take 10 mg by mouth daily as needed for allergies. 04/14/20  Yes Kallie LocksStroud, Natalie M, FNP  Ensure Plus (ENSURE PLUS) LIQD Take 237 mLs by mouth 3 (three) times daily between meals. Patient taking differently: Take 237 mLs by mouth daily. 08/26/19  Yes Kallie LocksStroud, Natalie M, FNP  folic acid (FOLVITE) 1 MG tablet Take 1 tablet (1 mg total) by mouth daily. 02/05/21  Yes Kallie LocksStroud, Natalie M, FNP  methocarbamol (ROBAXIN) 500 MG tablet Take 1 tablet (500 mg total) by mouth in the morning and at bedtime. Patient taking  differently: Take 500 mg by mouth in the morning, at noon, in the evening, and at bedtime. 04/14/20  Yes Kallie LocksStroud, Natalie M, FNP  naproxen (NAPROSYN) 500 MG tablet Take 1 tablet (500 mg total) by mouth 2 (two) times daily with a meal. Patient taking differently: Take 500 mg by mouth 2 (two) times daily as needed for mild pain. 11/27/20  Yes Kallie LocksStroud, Natalie M, FNP  omeprazole (PRILOSEC) 40 MG capsule Take 1 capsule (40 mg total) by mouth daily. 04/14/20  Yes Kallie LocksStroud, Natalie M, FNP  oxyCODONE (ROXICODONE) 15 MG immediate release tablet Take 1 tablet (15 mg total) by mouth every 4 (four) hours as needed for up to 15 days. 04/23/21 05/08/21 Yes Rometta EmeryGarba, Mohammad L, MD  predniSONE (STERAPRED UNI-PAK 21 TAB) 10 MG (21) TBPK tablet Take by mouth daily. Take 6 tabs by mouth daily  for 2 days, then 5 tabs for 2 days, then 4 tabs for 2 days, then 3 tabs for 2 days, 2 tabs for 2 days, then 1 tab by mouth daily for 2 days Patient taking differently: Take 10-60 mg by mouth See admin instructions. Take 60mg  by mouth daily  for 2 days, then 50mg  for 2 days, then 40mg  for 2 days, then 30 mg for 2 days, 20 for 2 days, then 10mg  by mouth daily for 2 days 04/12/21  Yes [name], [name], PA-C  famotidine (PEPCID) 20 MG tablet Take 1 tablet (20 mg total) by mouth 2 (two) times daily. Patient not taking: Reported on 04/28/2021 04/12/21   Dietrich PatesKhatri, [name], PA-C  OXYCONTIN 10 MG 12 hr tablet Take 1 tablet (10 mg total) by mouth every 8 (eight) hours as needed. Patient not taking: Reported on 04/28/2021 04/23/21   Rometta EmeryGarba, Mohammad L, MD  Vitamin D, Ergocalciferol, (DRISDOL) 1.25 MG (50000 UNIT) CAPS capsule Take 1 capsule (50,000 Units total) by mouth every 7 (seven) days. Patient not taking: No sig reported 04/14/20   Kallie LocksStroud, Natalie M, FNP    Allergies    Kiwi extract, Morphine and related, and Paroxetine  Review of Systems   Review of Systems 10 systems reviewed and negative except as per HPI Physical Exam Updated Vital Signs BP 121/86    Pulse (!) 102   Temp 98.7 F (37.1 C) (Oral)   Resp 14   Ht 5\' 5"  (1.651 m)   Wt 54.9 kg   LMP 04/22/2021 (Approximate)   SpO2 97%   BMI 20.14 kg/m   Physical Exam Constitutional:      Comments: Alert and nontoxic.  Patient is clinically well  in appearance.  No appearance of acute distress.  Well-nourished well-developed.  No respiratory distress.  HENT:     Mouth/Throat:     Mouth: Mucous membranes are moist.     Pharynx: Oropharynx is clear.  Eyes:     Extraocular Movements: Extraocular movements intact.  Cardiovascular:     Rate and Rhythm: Normal rate and regular rhythm.  Pulmonary:     Effort: Pulmonary effort is normal.     Breath sounds: Normal breath sounds.  Abdominal:     General: There is no distension.     Palpations: Abdomen is soft.     Tenderness: There is no abdominal tenderness. There is no guarding.  Musculoskeletal:        General: No swelling or tenderness. Normal range of motion.     Right lower leg: No edema.     Left lower leg: No edema.  Skin:    General: Skin is warm and dry.  Neurological:     General: No focal deficit present.     Mental Status: She is oriented to person, place, and time.     Cranial Nerves: No cranial nerve deficit.     Sensory: No sensory deficit.     Motor: No weakness.     Coordination: Coordination normal.  Psychiatric:        Mood and Affect: Mood normal.    ED Results / Procedures / Treatments   Labs (all labs ordered are listed, but only abnormal results are displayed) Labs Reviewed  COMPREHENSIVE METABOLIC PANEL - Abnormal; Notable for the following components:      Result Value   Potassium 3.0 (*)    Glucose, Bld 116 (*)    Calcium 8.7 (*)    Total Bilirubin 4.0 (*)    All other components within normal limits  CBC WITH DIFFERENTIAL/PLATELET - Abnormal; Notable for the following components:   WBC 20.6 (*)    RBC 2.47 (*)    Hemoglobin 8.9 (*)    HCT 26.9 (*)    MCV 108.9 (*)    MCH 36.0 (*)    RDW  21.6 (*)    nRBC 8.5 (*)    Neutro Abs 9.0 (*)    Lymphs Abs 8.9 (*)    Monocytes Absolute 2.2 (*)    Abs Immature Granulocytes 0.23 (*)    All other components within normal limits  RETICULOCYTES - Abnormal; Notable for the following components:   Retic Ct Pct 24.0 (*)    RBC. 2.44 (*)    Retic Count, Absolute 586.3 (*)    Immature Retic Fract 41.9 (*)    All other components within normal limits  I-STAT BETA HCG BLOOD, ED ([facility], [facility], [facility] ONLY)    EKG EKG Interpretation  Date/Time:  Thursday April 28 2021 12:46:40 EDT Ventricular Rate:  104 PR Interval:  146 QRS Duration: 92 QT Interval:  355 QTC Calculation: 467 R Axis:   80 Text Interpretation: Sinus tachycardia Right atrial enlargement Borderline repolarization abnormality no sig change Confirmed by Arby Barrette [phone]) on 04/28/2021 1:07:12 PM  Radiology No results found.  Procedures Procedures   Medications Ordered in ED Medications  dextrose 5 %-0.45 % sodium chloride infusion (has no administration in time range)  ketorolac (TORADOL) 15 MG/ML injection 15 mg (has no administration in time range)  HYDROmorphone (DILAUDID) injection 2 mg (has no administration in time range)  HYDROmorphone (DILAUDID) injection 2 mg (has no administration in time range)  diphenhydrAMINE (BENADRYL) injection 25 mg (has  no administration in time range)  ondansetron (ZOFRAN) injection 4 mg (has no administration in time range)    ED Course  I have reviewed the triage vital signs and the nursing notes.  Pertinent labs & imaging results that were available during my care of the patient were reviewed by me and considered in my medical decision making (see chart for details).    MDM Rules/Calculators/A&P                          Patient presents with known history of sickle cell anemia.  Patient has chronic pain and chronic leukocytosis.  At this time, afebrile and no focus of infection based on history of present illness or physical  exam.  Currently I have low suspicion for intercurrent infection.  Potassium is slightly low but unlikely to account for complaints of general tingling.  Patient does not have neurologic deficits.  At this time, consultation has been reviewed with [place] Hollis.  She advises initiating protocol pain control and she will admit the patient for overnight observation. Final Clinical Impression(s) / ED Diagnoses Final diagnoses:  Sickle cell pain crisis [facility])    Rx / DC Orders ED Discharge Orders     None        Arby Barrette, MD 04/28/21 1324

## 2021-04-28 NOTE — H&P (Signed)
H&P  Patient Demographics:  Kara [name], is a 35 y.o. female  MRN: [id number]   DOB - Jul 15, 1986  Admit Date - 04/28/2021  Outpatient Primary MD for the patient is Rounding, Sickle Cell, MD  Chief Complaint  Patient presents with   Sickle Cell Pain Crisis      HPI:   Kara [name]  is a 35 y.o. female with a medical history significant for sickle cell disease, chronic pain syndrome, opiate dependence and tolerance, history of pulmonary hypertension, chronic marijuana use, and history of generalized anxiety presents to the emergency department with complaints of generalized pain and swelling that is consistent with her typical sickle cell pain crisis.  Patient has had frequent emergency room's and hospital admissions over the past several weeks.  She was discharged from inpatient services on 04/23/2021.  Patient says that she was discharged in stable condition, her pain was controlled, however pain returned the following day and has been uncontrolled by her home medications.  She has been taking her oxycodone and OxyContin without any sustained relief.  She states that she has been resting, hydrating, and taking all prescribed medications with no improvement.  Patient is not on any disease modifying agents for sickle cell disease at this time.  She currently rates her pain as 9/10 characterized as constant and aching.  No urinary symptoms, nausea, vomiting, diarrhea, or constipation.  No headache, shortness of breath, chest pain, dizziness, or paresthesias.  No sick contacts, recent travel, or exposure to COVID-19.  ER course: Vital signs remained stable throughout ER course.  Patient is afebrile.  Oxygen saturation is 98% on RA.  Temperature 98.9 F, BP 116/70, pulse rate 90, and respirations 12.  Complete metabolic panel shows a total bilirubin of 4.0, otherwise unremarkable.  WBCs 20.6, which is improved from previous.  Hemoglobin 8.9 g/dL, consistent with patient's baseline.  Absolute reticulocyte  count is 586.3.  COVID-19 pending.  Chest x-ray shows no acute cardiopulmonary process.  Patient hemodynamically stable.  Pain persists despite IV Dilaudid and IV fluids.  Patient admitted to MedSurg and observation for further management of sickle cell pain crisis.   Review of systems:  In addition to the HPI above, patient reports No fever or chills No Headache, No changes with vision or hearing No problems swallowing food or liquids No chest pain, cough or shortness of breath No abdominal pain, No nausea or vomiting, Bowel movements are regular No blood in stool or urine No dysuria No new skin rashes or bruises No new joints pains-aches No new weakness, tingling, numbness in any extremity No recent weight gain or loss No polyuria, polydypsia or polyphagia No significant Mental Stressors   With Past History of the following :   Past Medical History:  Diagnosis Date   Arthritis    Chronic migraine    GERD (gastroesophageal reflux disease)    Hx of cholecystectomy 2015   Pulmonary hypertension (HCC)    Sickle cell anemia (HCC)    Tendinitis    left elbow      Past Surgical History:  Procedure Laterality Date   CHOLECYSTECTOMY     DILATION AND CURETTAGE OF UTERUS     GALLBLADDER SURGERY       Social History:   Social History   Tobacco Use   Smoking status: Every Day    Packs/day: 0.50    Pack years: 0.00    Types: Cigarettes   Smokeless tobacco: Never  Substance Use Topics   Alcohol use: No  Lives - At home   Family History :   Family History  Adopted: Yes     Home Medications:   Prior to Admission medications   Medication Sig Start Date End Date Taking? Authorizing Provider  butalbital-acetaminophen-caffeine (FIORICET) 50-325-40 MG tablet Take 1 tablet by mouth every 6 (six) hours as needed for headache. 06/11/20  Yes [name], FNP  cetirizine (ZYRTEC) 10 MG tablet Take 1 tablet (10 mg total) by mouth daily. Patient taking differently:  Take 10 mg by mouth daily as needed for allergies. 04/14/20  Yes [name], FNP  Ensure Plus (ENSURE PLUS) LIQD Take 237 mLs by mouth 3 (three) times daily between meals. Patient taking differently: Take 237 mLs by mouth daily. 08/26/19  Yes [name], FNP  folic acid (FOLVITE) 1 MG tablet Take 1 tablet (1 mg total) by mouth daily. 02/05/21  Yes [name], FNP  methocarbamol (ROBAXIN) 500 MG tablet Take 1 tablet (500 mg total) by mouth in the morning and at bedtime. Patient taking differently: Take 500 mg by mouth in the morning, at noon, in the evening, and at bedtime. 04/14/20  Yes [name], FNP  naproxen (NAPROSYN) 500 MG tablet Take 1 tablet (500 mg total) by mouth 2 (two) times daily with a meal. Patient taking differently: Take 500 mg by mouth 2 (two) times daily as needed for mild pain. 11/27/20  Yes [name], FNP  omeprazole (PRILOSEC) 40 MG capsule Take 1 capsule (40 mg total) by mouth daily. 04/14/20  Yes [name], FNP  oxyCODONE (ROXICODONE) 15 MG immediate release tablet Take 1 tablet (15 mg total) by mouth every 4 (four) hours as needed for up to 15 days. 04/23/21 05/08/21 Yes [name], MD  predniSONE (STERAPRED UNI-PAK 21 TAB) 10 MG (21) TBPK tablet Take by mouth daily. Take 6 tabs by mouth daily  for 2 days, then 5 tabs for 2 days, then 4 tabs for 2 days, then 3 tabs for 2 days, 2 tabs for 2 days, then 1 tab by mouth daily for 2 days Patient taking differently: Take 10-60 mg by mouth See admin instructions. Take 60mg  by mouth daily  for 2 days, then 50mg  for 2 days, then 40mg  for 2 days, then 30 mg for 2 days, 20 for 2 days, then 10mg  by mouth daily for 2 days 04/12/21  Yes [name], [name], PA-C  famotidine (PEPCID) 20 MG tablet Take 1 tablet (20 mg total) by mouth 2 (two) times daily. Patient not taking: Reported on 04/28/2021 04/12/21   , PA-C  OXYCONTIN 10 MG 12 hr tablet Take 1 tablet (10 mg total) by mouth every 8 (eight) hours  as needed. Patient not taking: Reported on 04/28/2021 04/23/21   04/14/21, MD  Vitamin D, Ergocalciferol, (DRISDOL) 1.25 MG (50000 UNIT) CAPS capsule Take 1 capsule (50,000 Units total) by mouth every 7 (seven) days. Patient not taking: No sig reported 04/14/20   06/28/2021, FNP     Allergies:   Allergies  Allergen Reactions   Kiwi Extract Hives   Morphine And Related Hives   Paroxetine Hives     Physical Exam:   Vitals:   Vitals:   04/29/21 0411 04/29/21 0533  BP:  (!) 105/53  Pulse:  (!) 110  Resp: 18 15  Temp:  98.9 F (37.2 C)  SpO2: 98% 94%    Physical Exam: Constitutional: Patient appears well-developed and well-nourished. Not in obvious distress.  HENT: Normocephalic, atraumatic, External right and left ear normal. Oropharynx is clear and moist.  Eyes: Conjunctivae and EOM are normal. PERRLA, no scleral icterus. Neck: Normal ROM. Neck supple. No JVD. No tracheal deviation. No thyromegaly. CVS: RRR, S1/S2 +, no murmurs, no gallops, no carotid bruit.  Pulmonary: Effort and breath sounds normal, no stridor, rhonchi, wheezes, rales.  Abdominal: Soft. BS +, no distension, tenderness, rebound or guarding.  Musculoskeletal: Normal range of motion. No edema and no tenderness.  Lymphadenopathy: No lymphadenopathy noted, cervical, inguinal or axillary Neuro: Alert. Normal reflexes, muscle tone coordination. No cranial nerve deficit. Skin: Skin is warm and dry. No rash noted. Not diaphoretic. No erythema. No pallor. Psychiatric: Normal mood and affect. Behavior, judgment, thought content normal.   Data Review:   CBC Recent Labs  Lab 04/22/21 1042 04/28/21 1150  WBC 26.9* 20.6*  HGB 8.2* 8.9*  HCT 24.7* 26.9*  PLT 412* 369  MCV 105.1* 108.9*  MCH 34.9* 36.0*  MCHC 33.2 33.1  RDW 19.9* 21.6*  LYMPHSABS 14.3* 8.9*  MONOABS 2.5* 2.2*  EOSABS 0.1 0.2  BASOSABS 0.1 0.1    ------------------------------------------------------------------------------------------------------------------  Chemistries  Recent Labs  Lab 04/22/21 1042 04/28/21 1150  NA 139 138  K 3.5 3.0*  CL 100 103  CO2 30 26  GLUCOSE 101* 116*  BUN 10 8  CREATININE 0.36* 0.50  CALCIUM 9.1 8.7*  AST 22 18  ALT 22 18  ALKPHOS 38 48  BILITOT 3.8* 4.0*   ------------------------------------------------------------------------------------------------------------------ estimated creatinine clearance is 85.9 mL/min (by C-G formula based on SCr of 0.5 mg/dL). ------------------------------------------------------------------------------------------------------------------ No results for input(s): TSH, T4TOTAL, T3FREE, THYROIDAB in the last 72 hours.  Invalid input(s): FREET3  Coagulation profile No results for input(s): INR, PROTIME in the last 168 hours. ------------------------------------------------------------------------------------------------------------------- No results for input(s): DDIMER in the last 72 hours. -------------------------------------------------------------------------------------------------------------------  Cardiac Enzymes No results for input(s): CKMB, TROPONINI, MYOGLOBIN in the last 168 hours.  Invalid input(s): CK ------------------------------------------------------------------------------------------------------------------ No results found for: BNP  ---------------------------------------------------------------------------------------------------------------  Urinalysis    Component Value Date/Time   COLORURINE AMBER (A) 12/01/2020 2125   APPEARANCEUR HAZY (A) 12/01/2020 2125   LABSPEC 1.013 12/01/2020 2125   PHURINE 5.0 12/01/2020 2125   GLUCOSEU NEGATIVE 12/01/2020 2125   HGBUR NEGATIVE 12/01/2020 2125   BILIRUBINUR NEGATIVE 12/01/2020 2125   BILIRUBINUR small 06/11/2020 1217   KETONESUR NEGATIVE 12/01/2020 2125   PROTEINUR  NEGATIVE 12/01/2020 2125   UROBILINOGEN >=8.0 (A) 06/11/2020 1217   NITRITE NEGATIVE 12/01/2020 2125   LEUKOCYTESUR NEGATIVE 12/01/2020 2125    ----------------------------------------------------------------------------------------------------------------   Imaging Results:    DG Chest 2 View  Result Date: 04/28/2021 CLINICAL DATA:  Chest pain. EXAM: CHEST - 2 VIEW COMPARISON:  Apr 16, 2021. FINDINGS: The heart size and mediastinal contours are within normal limits. Both lungs are clear. The visualized skeletal structures are unremarkable. IMPRESSION: No active cardiopulmonary disease. Electronically Signed   By: Lupita Raider M.D.   On: 04/28/2021 14:22     Assessment & Plan:  Principal Problem:   Sickle cell pain crisis (HCC) Active Problems:   Leukocytosis   Chronic pain syndrome   Anemia of chronic disease   Tobacco abuse   Sickle cell disease with pain crisis: Patient's pain persists despite IV Dilaudid and IV fluids.  Will admit to MedSurg for further management of sickle cell pain crisis.  Initiate IV fluids, D5 0.45% saline at 100 mL/h IV Dilaudid PCA with settings of 0.5 mg, 10-minute lockout, and 3 mg/h Toradol 15 mg  IV every 6 hours Monitor vital signs very closely, reevaluate pain scale regularly, and supplemental oxygen as needed. Patient will be reevaluated for pain in the context of function and relationship to baseline as her care progresses.  Leukocytosis: WBCs 20.6.  Patient is afebrile without any signs of active infection.  Secondary to vaso-occlusive pain crisis.  COVID-19 test pending.  Urinalysis and urine culture pending. Monitor very closely.  CBC in AM.  Anemia of chronic disease: Hemoglobin is stable and consistent with patient's baseline.  Patient's baseline is around 7.8 g/dL.  Hemoglobin currently 8.9 g/dL.  There is no clinical indication for blood transfusion at this time.  Continue to follow closely.  Chronic pain syndrome: Continue OxyContin  10 mg every 12 hours.  Hold oxycodone, use PCA Dilaudid.  Tobacco abuse: Patient counseled at length on the dangers of smoking especially in the setting of sickle cell disease.  Generalized anxiety: Patient has a long history of anxiety along with mood disorders.  She is not on any medications for this problem at this time.  Also, she is not followed by outpatient psychiatry.  She denies any suicidal homicidal intent at this time.  We will continue to follow closely.   DVT Prophylaxis: Subcut Lovenox   AM Labs Ordered, also please review Full Orders  Family Communication: Admission, patient's condition and plan of care including tests being ordered have been discussed with the patient who indicate understanding and agree with the plan and Code Status.  Code Status: Full Code  Consults called: None    Admission status: Inpatient    Time spent in minutes : 35 minutes   Nolon NationsLachina Moore Ski Polich  APRN, MSN, FNP-C Patient Care [facility] [address]  [place], [place] [id number] [phone]  04/29/2021 at 7:13 AM

## 2021-04-29 DIAGNOSIS — F17210 Nicotine dependence, cigarettes, uncomplicated: Secondary | ICD-10-CM | POA: Diagnosis present

## 2021-04-29 DIAGNOSIS — F1120 Opioid dependence, uncomplicated: Secondary | ICD-10-CM | POA: Diagnosis present

## 2021-04-29 DIAGNOSIS — F411 Generalized anxiety disorder: Secondary | ICD-10-CM | POA: Diagnosis present

## 2021-04-29 DIAGNOSIS — Z9102 Food additives allergy status: Secondary | ICD-10-CM | POA: Diagnosis not present

## 2021-04-29 DIAGNOSIS — K219 Gastro-esophageal reflux disease without esophagitis: Secondary | ICD-10-CM | POA: Diagnosis present

## 2021-04-29 DIAGNOSIS — Z79899 Other long term (current) drug therapy: Secondary | ICD-10-CM | POA: Diagnosis not present

## 2021-04-29 DIAGNOSIS — Z20822 Contact with and (suspected) exposure to covid-19: Secondary | ICD-10-CM | POA: Diagnosis present

## 2021-04-29 DIAGNOSIS — G894 Chronic pain syndrome: Secondary | ICD-10-CM | POA: Diagnosis present

## 2021-04-29 DIAGNOSIS — G43909 Migraine, unspecified, not intractable, without status migrainosus: Secondary | ICD-10-CM | POA: Diagnosis present

## 2021-04-29 DIAGNOSIS — I272 Pulmonary hypertension, unspecified: Secondary | ICD-10-CM | POA: Diagnosis present

## 2021-04-29 DIAGNOSIS — D5700 Hb-SS disease with crisis, unspecified: Secondary | ICD-10-CM | POA: Diagnosis present

## 2021-04-29 DIAGNOSIS — Z885 Allergy status to narcotic agent status: Secondary | ICD-10-CM | POA: Diagnosis not present

## 2021-04-29 DIAGNOSIS — D638 Anemia in other chronic diseases classified elsewhere: Secondary | ICD-10-CM | POA: Diagnosis not present

## 2021-04-29 DIAGNOSIS — Z888 Allergy status to other drugs, medicaments and biological substances status: Secondary | ICD-10-CM | POA: Diagnosis not present

## 2021-04-29 DIAGNOSIS — D72829 Elevated white blood cell count, unspecified: Secondary | ICD-10-CM | POA: Diagnosis present

## 2021-04-29 MED ORDER — OXYCODONE HCL 5 MG PO TABS
15.0000 mg | ORAL_TABLET | Freq: Four times a day (QID) | ORAL | Status: DC | PRN
  Administered 2021-04-29 (×2): 15 mg via ORAL
  Filled 2021-04-29 (×2): qty 3

## 2021-04-29 MED ORDER — BUTALBITAL-APAP-CAFFEINE 50-325-40 MG PO TABS
1.0000 | ORAL_TABLET | Freq: Four times a day (QID) | ORAL | Status: DC | PRN
  Administered 2021-04-29: 1 via ORAL
  Filled 2021-04-29: qty 1

## 2021-04-29 MED ORDER — SODIUM CHLORIDE 0.9 % IV BOLUS
500.0000 mL | Freq: Once | INTRAVENOUS | Status: AC
  Administered 2021-04-29: 500 mL via INTRAVENOUS

## 2021-04-29 NOTE — Progress Notes (Signed)
Subjective: Kara [name] is a 35 year old female with a medical history significant for sickle cell disease, chronic pain syndrome, opiate dependence and history of pulmonary hypertension, chronic marijuana use, and history of generalized anxiety was admitted for sickle cell pain crisis.  Today, patient continues to complain of generalized pain.  She rates pain as 9/10.  She says that pain has not improved overnight.  She denies headache, chest pain, shortness of breath, urinary symptoms nausea, vomiting, or diarrhea.  Objective:  Vital signs in last 24 hours:  Vitals:   04/29/21 0038 04/29/21 0411 04/29/21 0533 04/29/21 0859  BP:   (!) 105/53   Pulse:   (!) 110   Resp: 18 18 15 15   Temp:   98.9 F (37.2 C)   TempSrc:   Oral   SpO2: 98% 98% 94% 100%  Weight:      Height:        Intake/Output from previous day:   Intake/Output Summary (Last 24 hours) at 04/29/2021 1224 Last data filed at 04/29/2021 1100 Gross per 24 hour  Intake 2159.3 ml  Output --  Net 2159.3 ml    Physical Exam: General: Alert, awake, oriented x3, in no acute distress.  HEENT: [place]/AT PEERL, EOMI Neck: Trachea midline,  no masses, no thyromegal,y no JVD, no carotid bruit OROPHARYNX:  Moist, No exudate/ erythema/lesions.  Heart: Regular rate and rhythm, without murmurs, rubs, gallops, PMI non-displaced, no heaves or thrills on palpation.  Lungs: Clear to auscultation, no wheezing or rhonchi noted. No increased vocal fremitus resonant to percussion  Abdomen: Soft, nontender, nondistended, positive bowel sounds, no masses no hepatosplenomegaly noted..  Neuro: No focal neurological deficits noted cranial nerves II through XII grossly intact. DTRs 2+ bilaterally upper and lower extremities. Strength 5 out of 5 in bilateral upper and lower extremities. Musculoskeletal: No warm swelling or erythema around joints, no spinal tenderness noted. Psychiatric: Patient alert and oriented x3, good insight and cognition,  good recent to remote recall. Lymph node survey: No cervical axillary or inguinal lymphadenopathy noted.  Lab Results:  Basic Metabolic Panel:    Component Value Date/Time   NA 138 04/28/2021 1150   NA 138 07/14/2019 1128   K 3.0 (L) 04/28/2021 1150   CL 103 04/28/2021 1150   CO2 26 04/28/2021 1150   BUN 8 04/28/2021 1150   BUN 7 07/14/2019 1128   CREATININE 0.50 04/28/2021 1150   GLUCOSE 116 (H) 04/28/2021 1150   CALCIUM 8.7 (L) 04/28/2021 1150   CBC:    Component Value Date/Time   WBC 20.6 (H) 04/28/2021 1150   HGB 8.9 (L) 04/28/2021 1150   HGB 9.1 (L) 07/14/2019 1128   HCT 26.9 (L) 04/28/2021 1150   HCT 27.8 (L) 07/14/2019 1128   PLT 369 04/28/2021 1150   PLT 435 07/14/2019 1128   MCV 108.9 (H) 04/28/2021 1150   MCV 103 (H) 07/14/2019 1128   NEUTROABS 9.0 (H) 04/28/2021 1150   NEUTROABS 5.0 07/14/2019 1128   LYMPHSABS 8.9 (H) 04/28/2021 1150   LYMPHSABS 7.3 (H) 07/14/2019 1128   MONOABS 2.2 (H) 04/28/2021 1150   EOSABS 0.2 04/28/2021 1150   EOSABS 0.3 07/14/2019 1128   BASOSABS 0.1 04/28/2021 1150   BASOSABS 0.1 07/14/2019 1128    No results found for this or any previous visit (from the past 240 hour(s)).  Studies/Results: DG Chest 2 View  Result Date: 04/28/2021 CLINICAL DATA:  Chest pain. EXAM: CHEST - 2 VIEW COMPARISON:  Apr 16, 2021. FINDINGS: The heart size and mediastinal  contours are within normal limits. Both lungs are clear. The visualized skeletal structures are unremarkable. IMPRESSION: No active cardiopulmonary disease. Electronically Signed   By: Lupita Raider M.D.   On: 04/28/2021 14:22    Medications: Scheduled Meds:  enoxaparin (LOVENOX) injection  40 mg Subcutaneous Q24H   famotidine  20 mg Oral BID   folic acid  1 mg Oral Daily   HYDROmorphone   Intravenous Q4H   ketorolac  15 mg Intravenous Q6H   oxyCODONE  10 mg Oral Q12H   senna-docusate  1 tablet Oral BID   Continuous Infusions:  dextrose 5 % and 0.45% NaCl 100 mL/hr at 04/29/21  0314   PRN Meds:.butalbital-acetaminophen-caffeine, diphenhydrAMINE **OR** [DISCONTINUED] diphenhydrAMINE, methocarbamol, naloxone **AND** sodium chloride flush, ondansetron (ZOFRAN) IV, polyethylene glycol  Consultants: None  Procedures: None  Antibiotics: None  Assessment/Plan: Principal Problem:   Sickle cell pain crisis (HCC) Active Problems:   Leukocytosis   Chronic pain syndrome   Anemia of chronic disease   Tobacco abuse  Sickle cell disease with pain crisis: Decrease IV fluids to KVO Continue IV Dilaudid PCA, no changes in settings today Toradol 15 mg IV every 6 hours Restart oxycodone 15 mg every 6 hours as needed for severe breakthrough pain Monitor vital signs very closely, reevaluate pain scale regularly, and supplemental oxygen as needed  Anemia of chronic disease: Hemoglobin is stable and consistent with patient's baseline.  There is no clinical indication for blood transfusion at this time.  Continue to follow closely.  Leukocytosis: Stable.  Patient is afebrile without any signs of active infection.  No antibiotics at this time.  Continue to follow closely.  CBC in AM.  Chronic pain syndrome: Continue home medications  Tobacco dependence: Patient counseled at length on the dangers of smoking especially in the setting of sickle cell disease  Generalized anxiety: Stable.  Denies any suicidal or homicidal intent today.  Monitor closely.   Code Status: Full Code Family Communication: N/A Disposition Plan: Not yet ready for discharge  Hattye Siegfried [name]  APRN, MSN, FNP-C Patient Care [facility] [address] [place], [place] [address] [phone]  If 7PM-7AM, please contact night-coverage.  04/29/2021, 12:24 PM  LOS: 0 days

## 2021-04-29 NOTE — Progress Notes (Signed)
On call notified of yellow MEWS

## 2021-04-29 NOTE — Progress Notes (Signed)
Nutrition Brief Note  Patient identified on the Malnutrition Screening Tool (MST) Report  Wt Readings from Last 15 Encounters:  04/28/21 54.9 kg  04/23/21 54.7 kg  03/01/21 53.5 kg  02/09/21 55.8 kg  12/05/20 58.8 kg  12/01/20 54.4 kg  10/04/20 54.4 kg  08/28/20 54.4 kg  08/27/20 55.3 kg  07/29/20 54.6 kg  06/15/20 53.1 kg  06/11/20 53.1 kg  06/01/20 54.4 kg  05/14/20 54.9 kg  04/13/20 56.2 kg    Body mass index is 20.14 kg/m. Patient meets criteria for normal based on current BMI.   Current diet order is regular, patient is consuming approximately 100% of meals at this time. Labs and medications reviewed.   No nutrition interventions warranted at this time. If nutrition issues arise, please consult RD.   Tilda Franco, MS, RD, LDN Inpatient Clinical Dietitian Contact information available via Amion

## 2021-04-29 NOTE — Progress Notes (Signed)
PHARMACIST - PHYSICIAN COMMUNICATION  Key Points: Use following P&T approved IV to PO diphenhydramine (Benadryl) policy. Description contains the criteria that are approved  DR:   Hyman Hopes CONCERNING: IV to Oral Route Change Policy  RECOMMENDATION: This patient is ordered diphenhydramine by the intravenous route (no doses used so far).  Based on criteria approved by the Pharmacy and Therapeutics Committee, intravenous diphenhydramine is being converted to the equivalent oral dose form(s).   DESCRIPTION: These criteria include: Diphenhydramine is not prescribed to treat or prevent a severe allergic reaction Diphenhydramine is not prescribed as premedication prior to receiving blood product, biologic medication, antimicrobial, or chemotherapy agent The patient has tolerated at least one dose of an oral or enteral medication The patient has no evidence of active gastrointestinal bleeding or impaired GI absorption (gastrectomy, short bowel, patient on TNA or NPO). The patient is not undergoing procedural sedation   If you have questions about this conversion, please contact the Pharmacy Department  []   [phone] )  ( [phone] []   [phone] )  [facility] []   [phone] )   [facility] []   [phone] )  [facility] [x]   [phone] )  [facility]

## 2021-04-29 NOTE — Progress Notes (Signed)
   04/29/21 2051  Vitals  Temp 98.7 F (37.1 C)  Temp Source Oral  BP (!) 99/57  MAP (mmHg) 71  BP Location Left Arm  BP Method Automatic  Patient Position (if appropriate) Lying  Pulse Rate (!) 102  Pulse Rate Source Monitor  MEWS COLOR  MEWS Score Color Yellow  Oxygen Therapy  SpO2 98 %  O2 Device Room Air  MEWS Score  MEWS Temp 0  MEWS Systolic 1  MEWS Pulse 1  MEWS RR 0  MEWS LOC 0  MEWS Score 2

## 2021-04-30 DIAGNOSIS — Z720 Tobacco use: Secondary | ICD-10-CM

## 2021-04-30 DIAGNOSIS — D5700 Hb-SS disease with crisis, unspecified: Principal | ICD-10-CM

## 2021-04-30 DIAGNOSIS — G894 Chronic pain syndrome: Secondary | ICD-10-CM

## 2021-04-30 DIAGNOSIS — D72829 Elevated white blood cell count, unspecified: Secondary | ICD-10-CM

## 2021-04-30 DIAGNOSIS — D638 Anemia in other chronic diseases classified elsewhere: Secondary | ICD-10-CM

## 2021-04-30 LAB — COMPREHENSIVE METABOLIC PANEL WITH GFR
ALT: 24 U/L (ref 0–44)
AST: 23 U/L (ref 15–41)
Albumin: 3.2 g/dL — ABNORMAL LOW (ref 3.5–5.0)
Alkaline Phosphatase: 46 U/L (ref 38–126)
Anion gap: 6 (ref 5–15)
BUN: 6 mg/dL (ref 6–20)
CO2: 28 mmol/L (ref 22–32)
Calcium: 8.3 mg/dL — ABNORMAL LOW (ref 8.9–10.3)
Chloride: 105 mmol/L (ref 98–111)
Creatinine, Ser: 0.47 mg/dL (ref 0.44–1.00)
GFR, Estimated: >60 mL/min
Glucose, Bld: 95 mg/dL (ref 70–99)
Potassium: 3.6 mmol/L (ref 3.5–5.1)
Sodium: 139 mmol/L (ref 135–145)
Total Bilirubin: 5.5 mg/dL — ABNORMAL HIGH (ref 0.3–1.2)
Total Protein: 5.9 g/dL — ABNORMAL LOW (ref 6.5–8.1)

## 2021-04-30 LAB — CBC WITH DIFFERENTIAL/PLATELET
Abs Immature Granulocytes: 0.27 10*3/uL — ABNORMAL HIGH (ref 0.00–0.07)
Basophils Absolute: 0.1 10*3/uL (ref 0.0–0.1)
Basophils Relative: 1 %
Eosinophils Absolute: 0.2 10*3/uL (ref 0.0–0.5)
Eosinophils Relative: 1 %
HCT: 19.4 % — ABNORMAL LOW (ref 36.0–46.0)
Hemoglobin: 6.4 g/dL — CL (ref 12.0–15.0)
Immature Granulocytes: 2 %
Lymphocytes Relative: 33 %
Lymphs Abs: 5.6 10*3/uL — ABNORMAL HIGH (ref 0.7–4.0)
MCH: 34.4 pg — ABNORMAL HIGH (ref 26.0–34.0)
MCHC: 33 g/dL (ref 30.0–36.0)
MCV: 104.3 fL — ABNORMAL HIGH (ref 80.0–100.0)
Monocytes Absolute: 1.7 10*3/uL — ABNORMAL HIGH (ref 0.1–1.0)
Monocytes Relative: 10 %
Neutro Abs: 8.9 10*3/uL — ABNORMAL HIGH (ref 1.7–7.7)
Neutrophils Relative %: 53 %
Platelets: 314 10*3/uL (ref 150–400)
RBC: 1.86 MIL/uL — ABNORMAL LOW (ref 3.87–5.11)
RDW: 20.3 % — ABNORMAL HIGH (ref 11.5–15.5)
WBC: 16.7 10*3/uL — ABNORMAL HIGH (ref 4.0–10.5)
nRBC: 5 % — ABNORMAL HIGH (ref 0.0–0.2)

## 2021-04-30 LAB — PREPARE RBC (CROSSMATCH)

## 2021-04-30 MED ORDER — SODIUM CHLORIDE 0.9% IV SOLUTION: Freq: Once | INTRAVENOUS | Status: AC

## 2021-04-30 NOTE — Progress Notes (Signed)
   04/30/21 1541  Provider Notification  Provider Name/Title Dr. Hyman Hopes  Date Provider Notified 04/30/21  Time Provider Notified 1541  Notification Type Page  Notification Reason Other (Comment) (clarify- 1 or 2 units of blood to infuse?)  Provider response Other (Comment) (Infuse 2 units PRBC)  Date of Provider Response 04/30/21  Time of Provider Response [phone]

## 2021-04-30 NOTE — Progress Notes (Signed)
Date and time results received: 04/30/21 0754  Test: Hgb Critical Value: 6.4  Name of Provider Notified: Dr. Hyman Hopes  Orders Received? Or Actions Taken?:  MD aware. No new orders at this time.

## 2021-04-30 NOTE — Progress Notes (Addendum)
Patient ID: Kara [name], female   DOB: January 22, 1986, 35 y.o.   MRN: [id number] Subjective: Kara [name] is a 35 year old female with a medical history significant for sickle cell disease, chronic pain syndrome, opiate dependence and history of pulmonary hypertension, chronic marijuana use, and history of generalized anxiety was admitted for sickle cell pain crisis.  Patient continues to complain of severe generalized body pain.  She rates her pain at 9/10 today.  She says the pain goes down to 7 but quickly escalates to about 9 or 10/10 again.  She is describing this pain as being worse on the left side of her body.  She denies any headache, cough, chest pain, shortness of breath, nausea, vomiting.  She denies any urinary symptoms.  She said her tingling and numbness is improved.  Objective:  Vital signs in last 24 hours:  Vitals:   04/30/21 0408 04/30/21 0447 04/30/21 0734 04/30/21 0954  BP:  107/64  (!) 108/57  Pulse:  (!) 107  (!) 101  Resp: 18 16 15 17   Temp:  98.6 F (37 C)  98.8 F (37.1 C)  TempSrc:  Oral  Oral  SpO2: 99% 96% 100% 98%  Weight:      Height:        Intake/Output from previous day:   Intake/Output Summary (Last 24 hours) at 04/30/2021 1134 Last data filed at 04/30/2021 0040 Gross per 24 hour  Intake 1298.69 ml  Output --  Net 1298.69 ml    Physical Exam: General: Alert, awake, oriented x3, in no acute distress.  HEENT: [place]/AT PEERL, EOMI Neck: Trachea midline,  no masses, no thyromegal,y no JVD, no carotid bruit OROPHARYNX:  Moist, No exudate/ erythema/lesions.  Heart: Regular rate and rhythm, without murmurs, rubs, gallops, PMI non-displaced, no heaves or thrills on palpation.  Lungs: Clear to auscultation, no wheezing or rhonchi noted. No increased vocal fremitus resonant to percussion  Abdomen: Soft, nontender, nondistended, positive bowel sounds, no masses no hepatosplenomegaly noted..  Neuro: No focal neurological deficits noted cranial nerves  II through XII grossly intact. DTRs 2+ bilaterally upper and lower extremities. Strength 5 out of 5 in bilateral upper and lower extremities. Musculoskeletal: No warm swelling or erythema around joints, no spinal tenderness noted. Psychiatric: Patient alert and oriented x3, good insight and cognition, good recent to remote recall. Lymph node survey: No cervical axillary or inguinal lymphadenopathy noted.  Lab Results:  Basic Metabolic Panel:    Component Value Date/Time   NA 139 04/30/2021 0652   NA 138 07/14/2019 1128   K 3.6 04/30/2021 0652   CL 105 04/30/2021 0652   CO2 28 04/30/2021 0652   BUN 6 04/30/2021 0652   BUN 7 07/14/2019 1128   CREATININE 0.47 04/30/2021 0652   GLUCOSE 95 04/30/2021 0652   CALCIUM 8.3 (L) 04/30/2021 0652   CBC:    Component Value Date/Time   WBC 16.7 (H) 04/30/2021 0652   HGB 6.4 (LL) 04/30/2021 0652   HGB 9.1 (L) 07/14/2019 1128   HCT 19.4 (L) 04/30/2021 0652   HCT 27.8 (L) 07/14/2019 1128   PLT 314 04/30/2021 0652   PLT 435 07/14/2019 1128   MCV 104.3 (H) 04/30/2021 0652   MCV 103 (H) 07/14/2019 1128   NEUTROABS 8.9 (H) 04/30/2021 0652   NEUTROABS 5.0 07/14/2019 1128   LYMPHSABS 5.6 (H) 04/30/2021 0652   LYMPHSABS 7.3 (H) 07/14/2019 1128   MONOABS 1.7 (H) 04/30/2021 0652   EOSABS 0.2 04/30/2021 0652   EOSABS 0.3 07/14/2019 1128  BASOSABS 0.1 04/30/2021 0652   BASOSABS 0.1 07/14/2019 1128    No results found for this or any previous visit (from the past 240 hour(s)).  Studies/Results: DG Chest 2 View  Result Date: 04/28/2021 CLINICAL DATA:  Chest pain. EXAM: CHEST - 2 VIEW COMPARISON:  Apr 16, 2021. FINDINGS: The heart size and mediastinal contours are within normal limits. Both lungs are clear. The visualized skeletal structures are unremarkable. IMPRESSION: No active cardiopulmonary disease. Electronically Signed   By: Lupita Raider M.D.   On: 04/28/2021 14:22    Medications: Scheduled Meds:  sodium chloride   Intravenous Once    enoxaparin (LOVENOX) injection  40 mg Subcutaneous Q24H   famotidine  20 mg Oral BID   folic acid  1 mg Oral Daily   HYDROmorphone   Intravenous Q4H   ketorolac  15 mg Intravenous Q6H   oxyCODONE  10 mg Oral Q12H   senna-docusate  1 tablet Oral BID   Continuous Infusions:  dextrose 5 % and 0.45% NaCl 20 mL/hr at 04/29/21 1244   PRN Meds:.butalbital-acetaminophen-caffeine, diphenhydrAMINE **OR** [DISCONTINUED] diphenhydrAMINE, methocarbamol, naloxone **AND** sodium chloride flush, ondansetron (ZOFRAN) IV, oxyCODONE, polyethylene glycol  Consultants: None  Procedures: None  Antibiotics: None  Assessment/Plan: Principal Problem:   Sickle cell pain crisis (HCC) Active Problems:   Leukocytosis   Chronic pain syndrome   Anemia of chronic disease   Tobacco abuse  Hb Sickle Cell Disease with crisis: Continue IVF at [facility], continue weight based Dilaudid PCA at current dose setting, continue IV Toradol 15 mg Q 6 H for total of 5 days, continue oral home pain medications as ordered. Monitor vitals very closely, Re-evaluate pain scale regularly, 2 L of Oxygen by [facility]. Leukocytosis: Improving.  Patient is without fever, no evidence of infection or inflammation.  Continue to monitor stool closely without antibiotics. Sickle Cell Anemia: Hemoglobin has dropped significantly below baseline today.  We will transfuse patient with 2 units of packed red blood cells today.  Repeat labs in the a.m. Chronic pain Syndrome: Continue home pain medications. Tobacco dependence: Kara [name] was counseled on the dangers of tobacco use, and was advised to quit. Reviewed strategies to maximize success, including removing cigarettes and smoking materials from environment, stress management and support of family/friends.  Generalized anxiety: Patient counseled extensively.  She denies any suicidal ideations or thoughts.  We will continue to monitor very closely.  Code Status: Full Code Family Communication:  N/A Disposition Plan: Not yet ready for discharge  Kara [name]  If 7PM-7AM, please contact night-coverage.  04/30/2021, 11:34 AM  LOS: 1 day

## 2021-05-01 LAB — TYPE AND SCREEN
ABO/RH(D): A POS
Antibody Screen: NEGATIVE
Donor AG Type: NEGATIVE
Donor AG Type: NEGATIVE
Unit division: 0
Unit division: 0

## 2021-05-01 LAB — BPAM RBC
Blood Product Expiration Date: 202207102359
Blood Product Expiration Date: 202207122359
ISSUE DATE / TIME: 202206111518
ISSUE DATE / TIME: 202206112014
Unit Type and Rh: 6200
Unit Type and Rh: 6200

## 2021-05-01 LAB — CBC WITH DIFFERENTIAL/PLATELET
Abs Immature Granulocytes: 0.2 10*3/uL — ABNORMAL HIGH (ref 0.00–0.07)
Basophils Absolute: 0.1 10*3/uL (ref 0.0–0.1)
Basophils Relative: 1 %
Eosinophils Absolute: 0.2 10*3/uL (ref 0.0–0.5)
Eosinophils Relative: 2 %
HCT: 28.4 % — ABNORMAL LOW (ref 36.0–46.0)
Hemoglobin: 9.7 g/dL — ABNORMAL LOW (ref 12.0–15.0)
Immature Granulocytes: 2 %
Lymphocytes Relative: 38 %
Lymphs Abs: 5.2 10*3/uL — ABNORMAL HIGH (ref 0.7–4.0)
MCH: 34 pg (ref 26.0–34.0)
MCHC: 34.2 g/dL (ref 30.0–36.0)
MCV: 99.6 fL (ref 80.0–100.0)
Monocytes Absolute: 1.1 10*3/uL — ABNORMAL HIGH (ref 0.1–1.0)
Monocytes Relative: 8 %
Neutro Abs: 6.9 10*3/uL (ref 1.7–7.7)
Neutrophils Relative %: 49 %
Platelets: 292 10*3/uL (ref 150–400)
RBC: 2.85 MIL/uL — ABNORMAL LOW (ref 3.87–5.11)
RDW: 20.4 % — ABNORMAL HIGH (ref 11.5–15.5)
WBC: 13.8 10*3/uL — ABNORMAL HIGH (ref 4.0–10.5)
nRBC: 2.8 % — ABNORMAL HIGH (ref 0.0–0.2)

## 2021-05-01 MED ORDER — HYDROMORPHONE 1 MG/ML IV SOLN
INTRAVENOUS | Status: DC
  Administered 2021-05-01: 7.6 mg via INTRAVENOUS
  Administered 2021-05-02: 6 mg via INTRAVENOUS
  Administered 2021-05-02: 2 mg via INTRAVENOUS
  Administered 2021-05-02: 6.4 mg via INTRAVENOUS

## 2021-05-01 NOTE — Progress Notes (Signed)
Patient ID: Kara [name], female   DOB: 06/24/1986, 35 y.o.   MRN: [id number] Subjective: Kara [name] is a 35 year old female with a medical history significant for sickle cell disease, chronic pain syndrome, opiate dependence and history of pulmonary hypertension, chronic marijuana use, and history of generalized anxiety was admitted for sickle cell pain crisis.  Patient claims her pain is slightly improved but still significant to the extent that she cannot go home.  She rates her pain at 7/10 mostly on her left side and lower extremities.  She describes her pain as constant and throbbing.  She denies any cough, headache, nausea, vomiting or diarrhea.  No shortness of breath.  No urinary symptoms.  She is trying to ambulate in the room now. Objective:  Vital signs in last 24 hours:  Vitals:   05/01/21 1009 05/01/21 1014 05/01/21 1232 05/01/21 1416  BP: 111/72   103/69  Pulse: 89   88  Resp: 16 12 16 18   Temp: 98.9 F (37.2 C)   98.8 F (37.1 C)  TempSrc: Oral   Oral  SpO2: 97%   96%  Weight:      Height:        Intake/Output from previous day:   Intake/Output Summary (Last 24 hours) at 05/01/2021 1420 Last data filed at 05/01/2021 0900 Gross per 24 hour  Intake 1315 ml  Output --  Net 1315 ml     Physical Exam: General: Alert, awake, oriented x3, in no acute distress.  HEENT: [place]/AT PEERL, EOMI Neck: Trachea midline,  no masses, no thyromegal,y no JVD, no carotid bruit OROPHARYNX:  Moist, No exudate/ erythema/lesions.  Heart: Regular rate and rhythm, without murmurs, rubs, gallops, PMI non-displaced, no heaves or thrills on palpation.  Lungs: Clear to auscultation, no wheezing or rhonchi noted. No increased vocal fremitus resonant to percussion  Abdomen: Soft, nontender, nondistended, positive bowel sounds, no masses no hepatosplenomegaly noted..  Neuro: No focal neurological deficits noted cranial nerves II through XII grossly intact. DTRs 2+ bilaterally upper and  lower extremities. Strength 5 out of 5 in bilateral upper and lower extremities. Musculoskeletal: No warm swelling or erythema around joints, no spinal tenderness noted. Psychiatric: Patient alert and oriented x3, good insight and cognition, good recent to remote recall. Lymph node survey: No cervical axillary or inguinal lymphadenopathy noted.  Lab Results:  Basic Metabolic Panel:    Component Value Date/Time   NA 139 04/30/2021 0652   NA 138 07/14/2019 1128   K 3.6 04/30/2021 0652   CL 105 04/30/2021 0652   CO2 28 04/30/2021 0652   BUN 6 04/30/2021 0652   BUN 7 07/14/2019 1128   CREATININE 0.47 04/30/2021 0652   GLUCOSE 95 04/30/2021 0652   CALCIUM 8.3 (L) 04/30/2021 0652   CBC:    Component Value Date/Time   WBC 13.8 (H) 05/01/2021 0910   HGB 9.7 (L) 05/01/2021 0910   HGB 9.1 (L) 07/14/2019 1128   HCT 28.4 (L) 05/01/2021 0910   HCT 27.8 (L) 07/14/2019 1128   PLT 292 05/01/2021 0910   PLT 435 07/14/2019 1128   MCV 99.6 05/01/2021 0910   MCV 103 (H) 07/14/2019 1128   NEUTROABS 6.9 05/01/2021 0910   NEUTROABS 5.0 07/14/2019 1128   LYMPHSABS 5.2 (H) 05/01/2021 0910   LYMPHSABS 7.3 (H) 07/14/2019 1128   MONOABS 1.1 (H) 05/01/2021 0910   EOSABS 0.2 05/01/2021 0910   EOSABS 0.3 07/14/2019 1128   BASOSABS 0.1 05/01/2021 0910   BASOSABS 0.1 07/14/2019 1128  No results found for this or any previous visit (from the past 240 hour(s)).  Studies/Results: No results found.  Medications: Scheduled Meds:  enoxaparin (LOVENOX) injection  40 mg Subcutaneous Q24H   famotidine  20 mg Oral BID   folic acid  1 mg Oral Daily   HYDROmorphone   Intravenous Q4H   ketorolac  15 mg Intravenous Q6H   oxyCODONE  10 mg Oral Q12H   senna-docusate  1 tablet Oral BID   Continuous Infusions:  dextrose 5 % and 0.45% NaCl 20 mL/hr at 04/29/21 1244   PRN Meds:.butalbital-acetaminophen-caffeine, diphenhydrAMINE **OR** [DISCONTINUED] diphenhydrAMINE, methocarbamol, naloxone **AND** sodium  chloride flush, ondansetron (ZOFRAN) IV, oxyCODONE, polyethylene glycol  Consultants: None  Procedures: None  Antibiotics: None  Assessment/Plan: Principal Problem:   Sickle cell pain crisis (HCC) Active Problems:   Leukocytosis   Chronic pain syndrome   Anemia of chronic disease   Tobacco abuse  Hb Sickle Cell Disease with crisis: Continue IVF at [facility], begin to wean weight based Dilaudid PCA, continue IV Toradol 15 mg Q 6 H for total of 5 days, continue oral home pain medications as ordered. Monitor vitals very closely, Re-evaluate pain scale regularly, 2 L of Oxygen by [place]. Leukocytosis: Improved.  Hemoglobin is now above goal normal. Continue to monitor stool closely without antibiotics. Sickle Cell Anemia: Patient is now status post transfusion of 2 units of packed red blood cell, hemoglobin is 9.7 today which is around her baseline.  Patient is hemodynamically stable.  We will repeat labs in AM.. Chronic pain Syndrome: Continue home pain medications as ordered.  Patient encouraged to ambulate today Tobacco dependence: Kara [name] was counseled on the dangers of tobacco use, and was advised to quit. Reviewed strategies to maximize success, including removing cigarettes and smoking materials from environment, stress management and support of family/friends.  Generalized anxiety: Patient counseled extensively. She denies any suicidal ideations or thoughts.  We will continue to monitor very closely.  Code Status: Full Code Family Communication: N/A Disposition Plan: Not yet ready for discharge  Caitlin Ainley  If 7PM-7AM, please contact night-coverage.  05/01/2021, 2:20 PM  LOS: 2 days

## 2021-05-02 DIAGNOSIS — D5700 Hb-SS disease with crisis, unspecified: Secondary | ICD-10-CM | POA: Diagnosis not present

## 2021-05-02 LAB — CBC WITH DIFFERENTIAL/PLATELET
Abs Immature Granulocytes: 0.15 10*3/uL — ABNORMAL HIGH (ref 0.00–0.07)
Basophils Absolute: 0.1 10*3/uL (ref 0.0–0.1)
Basophils Relative: 1 %
Eosinophils Absolute: 0.2 10*3/uL (ref 0.0–0.5)
Eosinophils Relative: 2 %
HCT: 28 % — ABNORMAL LOW (ref 36.0–46.0)
Hemoglobin: 9.2 g/dL — ABNORMAL LOW (ref 12.0–15.0)
Immature Granulocytes: 1 %
Lymphocytes Relative: 46 %
Lymphs Abs: 5.1 10*3/uL — ABNORMAL HIGH (ref 0.7–4.0)
MCH: 33.6 pg (ref 26.0–34.0)
MCHC: 32.9 g/dL (ref 30.0–36.0)
MCV: 102.2 fL — ABNORMAL HIGH (ref 80.0–100.0)
Monocytes Absolute: 0.9 10*3/uL (ref 0.1–1.0)
Monocytes Relative: 8 %
Neutro Abs: 4.6 10*3/uL (ref 1.7–7.7)
Neutrophils Relative %: 42 %
Platelets: 283 10*3/uL (ref 150–400)
RBC: 2.74 MIL/uL — ABNORMAL LOW (ref 3.87–5.11)
RDW: 20 % — ABNORMAL HIGH (ref 11.5–15.5)
WBC: 11 10*3/uL — ABNORMAL HIGH (ref 4.0–10.5)
nRBC: 2.5 % — ABNORMAL HIGH (ref 0.0–0.2)

## 2021-05-02 LAB — COMPREHENSIVE METABOLIC PANEL
ALT: 22 U/L (ref 0–44)
AST: 21 U/L (ref 15–41)
Albumin: 3.3 g/dL — ABNORMAL LOW (ref 3.5–5.0)
Alkaline Phosphatase: 50 U/L (ref 38–126)
Anion gap: 7 (ref 5–15)
BUN: 6 mg/dL (ref 6–20)
CO2: 27 mmol/L (ref 22–32)
Calcium: 8.9 mg/dL (ref 8.9–10.3)
Chloride: 105 mmol/L (ref 98–111)
Creatinine, Ser: 0.54 mg/dL (ref 0.44–1.00)
GFR, Estimated: >60 mL/min (ref >60)
Glucose, Bld: 101 mg/dL — ABNORMAL HIGH (ref 70–99)
Potassium: 3.8 mmol/L (ref 3.5–5.1)
Sodium: 139 mmol/L (ref 135–145)
Total Bilirubin: 2.8 mg/dL — ABNORMAL HIGH (ref 0.3–1.2)
Total Protein: 6.3 g/dL — ABNORMAL LOW (ref 6.5–8.1)

## 2021-05-02 MED ORDER — HYDROMORPHONE 1 MG/ML IV SOLN
INTRAVENOUS | Status: DC
  Filled 2021-05-02: qty 30

## 2021-05-02 MED ORDER — OXYCODONE HCL 5 MG PO TABS
15.0000 mg | ORAL_TABLET | ORAL | Status: DC
  Administered 2021-05-02 – 2021-05-05 (×16): 15 mg via ORAL
  Filled 2021-05-02 (×16): qty 3

## 2021-05-02 MED ORDER — HYDROMORPHONE 1 MG/ML IV SOLN
INTRAVENOUS | Status: DC
  Administered 2021-05-02: 7.5 mg via INTRAVENOUS
  Administered 2021-05-02: 3.5 mg via INTRAVENOUS
  Administered 2021-05-03: 4 mg via INTRAVENOUS
  Administered 2021-05-03: 2 mg via INTRAVENOUS

## 2021-05-02 NOTE — Progress Notes (Addendum)
Subjective: Kara [name] is a 35 year old female with a medical history significant for sickle cell disease, chronic pain syndrome, opiate dependence and tolerance, history of pulmonary hypertension, chronic marijuana use, and history of generalized anxiety was admitted for sickle cell pain crisis.  Patient is sitting up eating breakfast, appears to be comfortable.  She says that her pain intensity has improved to 6/10, but has not improved enough to go home on today.  She is requesting 1 more day.  She denies any headache, cough, nausea, vomiting, diarrhea, or urinary symptoms.  Patient has been ambulating without assistance.  Objective:  Vital signs in last 24 hours:  Vitals:   05/02/21 0015 05/02/21 0406 05/02/21 0440 05/02/21 0738  BP: 103/71  107/63   Pulse: 80  74   Resp: 16 15 14 14   Temp: 98.1 F (36.7 C)  98.5 F (36.9 C)   TempSrc: Oral  Oral   SpO2: 97% 94% 96% 96%  Weight:      Height:        Intake/Output from previous day:  No intake or output data in the 24 hours ending 05/02/21 1016  Physical Exam: General: Alert, awake, oriented x3, in no acute distress.  HEENT: [place]/AT PEERL, EOMI Neck: Trachea midline,  no masses, no thyromegal,y no JVD, no carotid bruit OROPHARYNX:  Moist, No exudate/ erythema/lesions.  Heart: Regular rate and rhythm, without murmurs, rubs, gallops, PMI non-displaced, no heaves or thrills on palpation.  Lungs: Clear to auscultation, no wheezing or rhonchi noted. No increased vocal fremitus resonant to percussion  Abdomen: Soft, nontender, nondistended, positive bowel sounds, no masses no hepatosplenomegaly noted..  Neuro: No focal neurological deficits noted cranial nerves II through XII grossly intact. DTRs 2+ bilaterally upper and lower extremities. Strength 5 out of 5 in bilateral upper and lower extremities. Musculoskeletal: No warm swelling or erythema around joints, no spinal tenderness noted. Psychiatric: Patient alert and oriented x3,  good insight and cognition, good recent to remote recall. Lymph node survey: No cervical axillary or inguinal lymphadenopathy noted.  Lab Results:  Basic Metabolic Panel:    Component Value Date/Time   NA 139 05/02/2021 0529   NA 138 07/14/2019 1128   K 3.8 05/02/2021 0529   CL 105 05/02/2021 0529   CO2 27 05/02/2021 0529   BUN 6 05/02/2021 0529   BUN 7 07/14/2019 1128   CREATININE 0.54 05/02/2021 0529   GLUCOSE 101 (H) 05/02/2021 0529   CALCIUM 8.9 05/02/2021 0529   CBC:    Component Value Date/Time   WBC 11.0 (H) 05/02/2021 0529   HGB 9.2 (L) 05/02/2021 0529   HGB 9.1 (L) 07/14/2019 1128   HCT 28.0 (L) 05/02/2021 0529   HCT 27.8 (L) 07/14/2019 1128   PLT 283 05/02/2021 0529   PLT 435 07/14/2019 1128   MCV 102.2 (H) 05/02/2021 0529   MCV 103 (H) 07/14/2019 1128   NEUTROABS 4.6 05/02/2021 0529   NEUTROABS 5.0 07/14/2019 1128   LYMPHSABS 5.1 (H) 05/02/2021 0529   LYMPHSABS 7.3 (H) 07/14/2019 1128   MONOABS 0.9 05/02/2021 0529   EOSABS 0.2 05/02/2021 0529   EOSABS 0.3 07/14/2019 1128   BASOSABS 0.1 05/02/2021 0529   BASOSABS 0.1 07/14/2019 1128    No results found for this or any previous visit (from the past 240 hour(s)).  Studies/Results: No results found.  Medications: Scheduled Meds:  enoxaparin (LOVENOX) injection  40 mg Subcutaneous Q24H   famotidine  20 mg Oral BID   folic acid  1  mg Oral Daily   HYDROmorphone   Intravenous Q4H   ketorolac  15 mg Intravenous Q6H   oxyCODONE  15 mg Oral Q4H while awake   oxyCODONE  10 mg Oral Q12H   senna-docusate  1 tablet Oral BID   Continuous Infusions:  dextrose 5 % and 0.45% NaCl 10 mL/hr at 05/01/21 1534   PRN Meds:.butalbital-acetaminophen-caffeine, diphenhydrAMINE **OR** [DISCONTINUED] diphenhydrAMINE, methocarbamol, naloxone **AND** sodium chloride flush, ondansetron (ZOFRAN) IV, polyethylene glycol  Consultants: None  Procedures: None  Antibiotics: None  Assessment/Plan: Principal Problem:    Sickle cell pain crisis (HCC) Active Problems:   Leukocytosis   Chronic pain syndrome   Anemia of chronic disease   Tobacco abuse  Sickle cell disease with pain crisis: Weaning IV Dilaudid PCA Continue [facility] at [facility] Continue IV Toradol 15 mg every 6 hours for total of 5 days Change frequency of oxycodone to 15 mg every 4 hours while patient is awake Monitor vital signs very closely, reevaluate pain scale regularly, and supplemental oxygen as needed  Leukocytosis: Continues to improve.  Patient afebrile without signs of acute infection.  Monitor closely without antibiotics.  CBC in AM.  Sickle cell anemia: Patient is s/p 2 units PRBCs, hemoglobin has returned to baseline.  Patient is hemodynamically stable.  Follow CBC in a.m.  Chronic pain syndrome: Continue home medications  Tobacco dependence: Patient counseled at length about the dangers of smoking especially in the setting of sickle cell disease.  Generalized anxiety: Patient counseled.  Today, she denies any suicidal or homicidal intent.  Continue to monitor closely.  Code Status: Full Code Family Communication: N/A Disposition Plan: Not yet ready for discharge.  Discharge plan for 05/03/2021. Nolon Nations  APRN, MSN, FNP-C Patient Care [facility] [address] [place], [place] [address] [phone]  If 5PM-8AM, please contact night-coverage.  05/02/2021, 10:16 AM  LOS: 3 days

## 2021-05-03 ENCOUNTER — Inpatient Hospital Stay (HOSPITAL_COMMUNITY): Payer: Medicare Other

## 2021-05-03 DIAGNOSIS — D5700 Hb-SS disease with crisis, unspecified: Secondary | ICD-10-CM | POA: Diagnosis not present

## 2021-05-03 LAB — CBC
HCT: 27.2 % — ABNORMAL LOW (ref 36.0–46.0)
Hemoglobin: 8.9 g/dL — ABNORMAL LOW (ref 12.0–15.0)
MCH: 33.1 pg (ref 26.0–34.0)
MCHC: 32.7 g/dL (ref 30.0–36.0)
MCV: 101.1 fL — ABNORMAL HIGH (ref 80.0–100.0)
Platelets: 268 10*3/uL (ref 150–400)
RBC: 2.69 MIL/uL — ABNORMAL LOW (ref 3.87–5.11)
RDW: 18.4 % — ABNORMAL HIGH (ref 11.5–15.5)
WBC: 8.5 10*3/uL (ref 4.0–10.5)
nRBC: 2.6 % — ABNORMAL HIGH (ref 0.0–0.2)

## 2021-05-03 MED ORDER — HYDROMORPHONE HCL 1 MG/ML IJ SOLN
1.0000 mg | INTRAMUSCULAR | Status: DC | PRN
  Administered 2021-05-03 – 2021-05-05 (×10): 1 mg via INTRAVENOUS
  Filled 2021-05-03 (×10): qty 1

## 2021-05-03 MED ORDER — OXYCODONE HCL ER 15 MG PO T12A
15.0000 mg | EXTENDED_RELEASE_TABLET | Freq: Two times a day (BID) | ORAL | Status: DC
Start: 2021-05-03 — End: 2021-05-05
  Administered 2021-05-03 – 2021-05-05 (×5): 15 mg via ORAL
  Filled 2021-05-03 (×5): qty 1

## 2021-05-03 NOTE — Care Management Important Message (Signed)
Important Message  Patient Details IM Letter given to the Patient. Name: Haruye Lainez MRN: [id number] Date of Birth: May 22, 1986   Medicare Important Message Given:  Yes     Caren Macadam 05/03/2021, 10:13 AM

## 2021-05-03 NOTE — Progress Notes (Addendum)
Subjective: Kara [name] is a 35 year old female with a medical history significant for sickle cell disease, chronic pain syndrome, opiate dependence and tolerance, history of pulmonary hypertension, chronic marijuana use, and history of generalized anxiety was admitted for sickle cell pain crisis. Patient continues to complain of severe generalized pain.  She rates her pain as 7/10 despite IV Dilaudid PCA, OxyContin and oxycodone.  She denies any headache, chest pain, shortness of breath, urinary symptoms, nausea, vomiting, or diarrhea.  Patient is afebrile and oxygen saturation is 96% on RA.  Objective:  Vital signs in last 24 hours:  Vitals:   05/03/21 0027 05/03/21 0321 05/03/21 0429 05/03/21 0736  BP: (!) 92/51  98/67   Pulse: 88  85   Resp: 14 14 16 16   Temp: 98.3 F (36.8 C)  98.3 F (36.8 C)   TempSrc: Oral  Oral   SpO2: 98% 98% 96% 96%  Weight:      Height:        Intake/Output from previous day:   Intake/Output Summary (Last 24 hours) at 05/03/2021 0842 Last data filed at 05/03/2021 0043 Gross per 24 hour  Intake 541 ml  Output --  Net 541 ml    Physical Exam: General: Alert, awake, oriented x3, in no acute distress.  HEENT: [place]/AT PEERL, EOMI Neck: Trachea midline,  no masses, no thyromegal,y no JVD, no carotid bruit OROPHARYNX:  Moist, No exudate/ erythema/lesions.  Heart: Regular rate and rhythm, without murmurs, rubs, gallops, PMI non-displaced, no heaves or thrills on palpation.  Lungs: Clear to auscultation, no wheezing or rhonchi noted. No increased vocal fremitus resonant to percussion  Abdomen: Soft, nontender, nondistended, positive bowel sounds, no masses no hepatosplenomegaly noted..  Neuro: No focal neurological deficits noted cranial nerves II through XII grossly intact. DTRs 2+ bilaterally upper and lower extremities. Strength 5 out of 5 in bilateral upper and lower extremities. Musculoskeletal: No warm swelling or erythema around joints, no spinal  tenderness noted. Psychiatric: Patient alert and oriented x3, good insight and cognition, good recent to remote recall. Lymph node survey: No cervical axillary or inguinal lymphadenopathy noted.  Lab Results:  Basic Metabolic Panel:    Component Value Date/Time   NA 139 05/02/2021 0529   NA 138 07/14/2019 1128   K 3.8 05/02/2021 0529   CL 105 05/02/2021 0529   CO2 27 05/02/2021 0529   BUN 6 05/02/2021 0529   BUN 7 07/14/2019 1128   CREATININE 0.54 05/02/2021 0529   GLUCOSE 101 (H) 05/02/2021 0529   CALCIUM 8.9 05/02/2021 0529   CBC:    Component Value Date/Time   WBC 8.5 05/03/2021 0532   HGB 8.9 (L) 05/03/2021 0532   HGB 9.1 (L) 07/14/2019 1128   HCT 27.2 (L) 05/03/2021 0532   HCT 27.8 (L) 07/14/2019 1128   PLT 268 05/03/2021 0532   PLT 435 07/14/2019 1128   MCV 101.1 (H) 05/03/2021 0532   MCV 103 (H) 07/14/2019 1128   NEUTROABS 4.6 05/02/2021 0529   NEUTROABS 5.0 07/14/2019 1128   LYMPHSABS 5.1 (H) 05/02/2021 0529   LYMPHSABS 7.3 (H) 07/14/2019 1128   MONOABS 0.9 05/02/2021 0529   EOSABS 0.2 05/02/2021 0529   EOSABS 0.3 07/14/2019 1128   BASOSABS 0.1 05/02/2021 0529   BASOSABS 0.1 07/14/2019 1128    No results found for this or any previous visit (from the past 240 hour(s)).  Studies/Results: No results found.  Medications: Scheduled Meds:  enoxaparin (LOVENOX) injection  40 mg Subcutaneous Q24H   famotidine  20  mg Oral BID   folic acid  1 mg Oral Daily   ketorolac  15 mg Intravenous Q6H   oxyCODONE  15 mg Oral Q4H while awake   oxyCODONE  15 mg Oral Q12H   senna-docusate  1 tablet Oral BID   Continuous Infusions: PRN Meds:.butalbital-acetaminophen-caffeine, methocarbamol, polyethylene glycol  Consultants: None  Procedures: None  Antibiotics: None  Assessment/Plan: Principal Problem:   Sickle cell pain crisis (HCC) Active Problems:   Leukocytosis   Chronic pain syndrome   Anemia of chronic disease   Tobacco abuse  Sickle cell disease  with pain crisis: Patient states that pain persists despite IV Dilaudid PCA.  PCA ineffective, will discharge today. Increase OxyContin to 15 mg every 12 hours Continue oxycodone 15 mg every 4 hours while awake.  Frequency of oxycodone was increased on 05/02/2021. Dilaudid 1 mg IV every 4 hours as needed for severe breakthrough pain Continue to monitor vital signs very closely, reevaluate pain scale regularly, and supplemental oxygen as needed.  Leukocytosis: Stable.  Patient remains afebrile without any signs of acute infection.  Continue to follow closely.  Sickle cell anemia: Hemoglobin is stable and consistent with patient's baseline.  She is s/p 2 units PRBCs.  No further transfusions warranted at this time.  Labs in AM.  Chronic pain syndrome: Adjust home medications today.  OxyContin increased to 15 mg every 12 hours  Tobacco dependence: Patient counseled extensively about the dangers of smoking especially in the setting of sickle cell crisis.  Generalized anxiety: Patient counseled at length.  Today she denies any suicidal homicidal intent.   Code Status: Full Code Family Communication: N/A Disposition Plan: Not yet ready for discharge  Arwen Haseley [name]  APRN, MSN, FNP-C [facility] [address] [place], [place] [address] [phone]  If 5PM-8AM, please contact night-coverage.  05/03/2021, 8:42 AM  LOS: 4 days

## 2021-05-04 ENCOUNTER — Ambulatory Visit: Payer: Medicare Other | Admitting: Nurse Practitioner

## 2021-05-04 DIAGNOSIS — D5700 Hb-SS disease with crisis, unspecified: Secondary | ICD-10-CM | POA: Diagnosis not present

## 2021-05-04 LAB — CBC
HCT: 28.2 % — ABNORMAL LOW (ref 36.0–46.0)
Hemoglobin: 9.1 g/dL — ABNORMAL LOW (ref 12.0–15.0)
MCH: 32.7 pg (ref 26.0–34.0)
MCHC: 32.3 g/dL (ref 30.0–36.0)
MCV: 101.4 fL — ABNORMAL HIGH (ref 80.0–100.0)
Platelets: 252 10*3/uL (ref 150–400)
RBC: 2.78 MIL/uL — ABNORMAL LOW (ref 3.87–5.11)
RDW: 17.8 % — ABNORMAL HIGH (ref 11.5–15.5)
WBC: 9.9 10*3/uL (ref 4.0–10.5)
nRBC: 1.2 % — ABNORMAL HIGH (ref 0.0–0.2)

## 2021-05-04 LAB — BASIC METABOLIC PANEL
Anion gap: 6 (ref 5–15)
BUN: <5 mg/dL — ABNORMAL LOW (ref 6–20)
CO2: 25 mmol/L (ref 22–32)
Calcium: 8.8 mg/dL — ABNORMAL LOW (ref 8.9–10.3)
Chloride: 106 mmol/L (ref 98–111)
Creatinine, Ser: 0.51 mg/dL (ref 0.44–1.00)
GFR, Estimated: >60 mL/min (ref >60)
Glucose, Bld: 88 mg/dL (ref 70–99)
Potassium: 4.1 mmol/L (ref 3.5–5.1)
Sodium: 137 mmol/L (ref 135–145)

## 2021-05-04 MED ORDER — GABAPENTIN 300 MG PO CAPS
300.0000 mg | ORAL_CAPSULE | Freq: Three times a day (TID) | ORAL | Status: DC
  Administered 2021-05-04 – 2021-05-05 (×3): 300 mg via ORAL
  Filled 2021-05-04 (×3): qty 1

## 2021-05-04 MED ORDER — PANTOPRAZOLE SODIUM 20 MG PO TBEC
20.0000 mg | DELAYED_RELEASE_TABLET | Freq: Every day | ORAL | Status: DC
  Administered 2021-05-04 – 2021-05-05 (×2): 20 mg via ORAL
  Filled 2021-05-04 (×2): qty 1

## 2021-05-04 MED ORDER — SODIUM CHLORIDE 0.45 % IV SOLN: INTRAVENOUS | Status: DC

## 2021-05-04 NOTE — Progress Notes (Signed)
Subjective: Kara [name] is a 35 year old female with a medical history significant for sickle cell disease, chronic pain syndrome, opiate dependence and tolerance, history of pulmonary hypertension, chronic marijuana use, and history of generalized anxiety was admitted for sickle cell pain crisis. Patient continues to complain of left arm pain.  Left forearm x-ray is unremarkable, no fracture or avascular necrosis noted.  Patient characterizes left arm pain as shooting and constant.  She also endorses some numbness and tingling to fingertips.  She says that she has had this pain before with sickle cell crises.  She rates her pain as 9/10. She denies dizziness, shortness of breath, headache, urinary symptoms, nausea, vomiting, or diarrhea.  Objective:  Vital signs in last 24 hours:  Vitals:   05/03/21 2130 05/04/21 0152 05/04/21 0626 05/04/21 1020  BP: 106/63 114/70 117/77 108/70  Pulse: 90 89 75 91  Resp: 18 14 18 20   Temp: 97.9 F (36.6 C) 99.3 F (37.4 C) 98.8 F (37.1 C) 98.3 F (36.8 C)  TempSrc: Oral Oral Oral Oral  SpO2: 99% 97% 100% 99%  Weight:      Height:        Intake/Output from previous day:   Intake/Output Summary (Last 24 hours) at 05/04/2021 1128 Last data filed at 05/04/2021 1111 Gross per 24 hour  Intake 480 ml  Output --  Net 480 ml    Physical Exam: General: Alert, awake, oriented x3, in no acute distress.  HEENT: [place]/AT PEERL, EOMI Neck: Trachea midline,  no masses, no thyromegal,y no JVD, no carotid bruit OROPHARYNX:  Moist, No exudate/ erythema/lesions.  Heart: Regular rate and rhythm, without murmurs, rubs, gallops, PMI non-displaced, no heaves or thrills on palpation.  Lungs: Clear to auscultation, no wheezing or rhonchi noted. No increased vocal fremitus resonant to percussion  Abdomen: Soft, nontender, nondistended, positive bowel sounds, no masses no hepatosplenomegaly noted..  Neuro: No focal neurological deficits noted cranial nerves II through  XII grossly intact. DTRs 2+ bilaterally upper and lower extremities. Strength 5 out of 5 in bilateral upper and lower extremities. Musculoskeletal: No warm swelling or erythema around joints, no spinal tenderness noted. Psychiatric: Patient alert and oriented x3, good insight and cognition, good recent to remote recall. Lymph node survey: No cervical axillary or inguinal lymphadenopathy noted.  Lab Results:  Basic Metabolic Panel:    Component Value Date/Time   NA 137 05/04/2021 0711   NA 138 07/14/2019 1128   K 4.1 05/04/2021 0711   CL 106 05/04/2021 0711   CO2 25 05/04/2021 0711   BUN <5 (L) 05/04/2021 0711   BUN 7 07/14/2019 1128   CREATININE 0.51 05/04/2021 0711   GLUCOSE 88 05/04/2021 0711   CALCIUM 8.8 (L) 05/04/2021 0711   CBC:    Component Value Date/Time   WBC 9.9 05/04/2021 0711   HGB 9.1 (L) 05/04/2021 0711   HGB 9.1 (L) 07/14/2019 1128   HCT 28.2 (L) 05/04/2021 0711   HCT 27.8 (L) 07/14/2019 1128   PLT 252 05/04/2021 0711   PLT 435 07/14/2019 1128   MCV 101.4 (H) 05/04/2021 0711   MCV 103 (H) 07/14/2019 1128   NEUTROABS 4.6 05/02/2021 0529   NEUTROABS 5.0 07/14/2019 1128   LYMPHSABS 5.1 (H) 05/02/2021 0529   LYMPHSABS 7.3 (H) 07/14/2019 1128   MONOABS 0.9 05/02/2021 0529   EOSABS 0.2 05/02/2021 0529   EOSABS 0.3 07/14/2019 1128   BASOSABS 0.1 05/02/2021 0529   BASOSABS 0.1 07/14/2019 1128    No results found for this or  any previous visit (from the past 240 hour(s)).  Studies/Results: DG Forearm Left  Result Date: 05/03/2021 CLINICAL DATA:  Pain.  Sickle cell disease EXAM: LEFT FOREARM - 2 VIEW COMPARISON:  None. FINDINGS: Frontal and lateral views were obtained. No fracture or dislocation. Joint spaces appear normal. No erosion. There is minus ulnar variance. IMPRESSION: No fracture or dislocation. No evident arthropathy. Minus ulnar variance. Electronically Signed   By: Bretta Bang III M.D.   On: 05/03/2021 18:55    Medications: Scheduled  Meds:  enoxaparin (LOVENOX) injection  40 mg Subcutaneous Q24H   folic acid  1 mg Oral Daily   gabapentin  300 mg Oral TID   oxyCODONE  15 mg Oral Q4H while awake   oxyCODONE  15 mg Oral Q12H   pantoprazole  20 mg Oral Daily   senna-docusate  1 tablet Oral BID   Continuous Infusions: PRN Meds:.butalbital-acetaminophen-caffeine, HYDROmorphone (DILAUDID) injection, methocarbamol, polyethylene glycol  Consultants: None  Procedures: None  Antibiotics: None  Assessment/Plan: Principal Problem:   Sickle cell pain crisis (HCC) Active Problems:   Leukocytosis   Chronic pain syndrome   Anemia of chronic disease   Tobacco abuse  Sickle cell disease with pain crisis: Continue OxyContin 15 mg every 12 hours Oxycodone 15 mg every 4 hours while awake Dilaudid 1 mg every 4 hours as needed for severe breakthrough pain Patient has completed Toradol Monitor vital signs very closely, reevaluate pain scale regularly, and supplemental oxygen as needed  Left arm pain: Physical exam shows no warmth or erythema.  No edema noted.  Patient states that her arm "feels swollen".  Patient characterizes pain as shooting.  Initiate gabapentin 300 mg every 8 hours.  Leukocytosis: Stable.  Patient remains afebrile.  No signs of active infection.  Continue to follow closely without antibiotics.  Chronic pain syndrome: Continue home medications  Sickle cell anemia: Hemoglobin stable and consistent with patient's baseline.  No further transfusion warranted at this time.  Patient is s/p 2 units PRBCs.  Follow labs in AM.  Generalized anxiety: Patient previously counseled.  No suicidal homicidal intent on today.  Code Status: Full Code Family Communication: N/A Disposition Plan: Not yet ready for discharge  If 5PM-8AM, please contact night-coverage.  05/04/2021, 11:28 AM  LOS: 5 days

## 2021-05-05 ENCOUNTER — Telehealth: Payer: Self-pay

## 2021-05-05 ENCOUNTER — Other Ambulatory Visit: Payer: Self-pay | Admitting: Family Medicine

## 2021-05-05 DIAGNOSIS — D5700 Hb-SS disease with crisis, unspecified: Secondary | ICD-10-CM | POA: Diagnosis not present

## 2021-05-05 DIAGNOSIS — F1120 Opioid dependence, uncomplicated: Secondary | ICD-10-CM

## 2021-05-05 MED ORDER — OXYCODONE HCL 15 MG PO TABS: 15.0000 mg | ORAL_TABLET | ORAL | 0 refills | Status: DC | PRN

## 2021-05-05 MED ORDER — GABAPENTIN 300 MG PO CAPS: 300.0000 mg | ORAL_CAPSULE | Freq: Three times a day (TID) | ORAL | 1 refills | Status: DC

## 2021-05-05 MED ORDER — NALOXONE HCL 4 MG/0.1ML NA LIQD
NASAL | 1 refills | Status: DC
Start: 2021-05-05 — End: 2022-03-30

## 2021-05-05 MED ORDER — OXYCONTIN 10 MG PO T12A: 10.0000 mg | EXTENDED_RELEASE_TABLET | Freq: Two times a day (BID) | ORAL | 0 refills | Status: DC

## 2021-05-05 MED ORDER — OXYCODONE HCL ER 15 MG PO T12A: 15.0000 mg | EXTENDED_RELEASE_TABLET | Freq: Two times a day (BID) | ORAL | 0 refills | Status: DC

## 2021-05-05 NOTE — Progress Notes (Signed)
Reviewed PDMP substance reporting system prior to prescribing opiate medications. No inconsistencies noted.  Meds ordered this encounter  Medications   naloxone (NARCAN) nasal spray 4 mg/0.1 mL    Sig: 1 spray in nostril, may use every 2-3 minutes in alternating nostrils while awaiting medical assistance    Dispense:  1 each    Refill:  1    Order Specific Question:   Supervising Provider    Answer:   Quentin Angst [[id number]]   oxyCODONE (OXYCONTIN) 15 mg 12 hr tablet    Sig: Take 1 tablet (15 mg total) by mouth every 12 (twelve) hours.    Dispense:  60 tablet    Refill:  0    Order Specific Question:   Supervising Provider    Answer:   Quentin Angst [[id number]]     Nolon Nations  APRN, MSN, FNP-C Patient Care [facility] [address] [place], [place] [address] [phone]

## 2021-05-05 NOTE — Telephone Encounter (Signed)
Pt states that the wrong medicine were sent to pharmacy  Oxycontin 15 mg? Not 10 mg

## 2021-05-05 NOTE — Discharge Summary (Signed)
Physician Discharge Summary  Kara [name] WHQ:[id number] DOB: June 23, 1986 DOA: 04/28/2021  PCP: Debbora Lacrosse, MD  Admit date: 04/28/2021  Discharge date: 05/05/2021  Discharge Diagnoses:  Principal Problem:   Sickle cell pain crisis (HCC) Active Problems:   Leukocytosis   Chronic pain syndrome   Anemia of chronic disease   Tobacco abuse   Discharge Condition: Stable  Disposition:   Follow-up Information     Barbette Merino, NP Follow up in 1 week(s).   Specialty: Adult Health Nurse Practitioner Contact information: [address] [place] [address] [phone]                Pt is discharged home in good condition and is to follow up with [place]ing, Sickle Cell, MD this week to have labs evaluated. [name] is instructed to increase activity slowly and balance with rest for the next few days, and use prescribed medication to complete treatment of pain  Diet: Regular Wt Readings from Last 3 Encounters:  04/28/21 54.9 kg  04/23/21 54.7 kg  03/01/21 53.5 kg    History of present illness:   [name]  is a 35 y.o. female with a medical history significant for sickle cell disease, chronic pain syndrome, opiate dependence and tolerance, history of pulmonary hypertension, chronic marijuana use, and history of generalized anxiety presents to the emergency department with complaints of generalized pain and swelling that is consistent with her typical sickle cell pain crisis.  Patient has had frequent emergency room's and hospital admissions over the past several weeks.  She was discharged from inpatient services on 04/23/2021.  Patient says that she was discharged in stable condition, her pain was controlled, however pain returned the following day and has been uncontrolled by her home medications.  She has been taking her oxycodone and OxyContin without any sustained relief.  She states that she has been resting, hydrating, and taking all  prescribed medications with no improvement.  Patient is not on any disease modifying agents for sickle cell disease at this time.  She currently rates her pain as 9/10 characterized as constant and aching.  No urinary symptoms, nausea, vomiting, diarrhea, or constipation.  No headache, shortness of breath, chest pain, dizziness, or paresthesias.  No sick contacts, recent travel, or exposure to COVID-19.  ER course: Vital signs remained stable throughout ER course.  Patient is afebrile.  Oxygen saturation is 98% on RA.  Temperature 98.9 F, BP 116/70, pulse rate 90, and respirations 12.  Complete metabolic panel shows a total bilirubin of 4.0, otherwise unremarkable.  WBCs 20.6, which is improved from previous.  Hemoglobin 8.9 g/dL, consistent with patient's baseline.  Absolute reticulocyte count is 586.3.  COVID-19 pending.  Chest x-ray shows no acute cardiopulmonary process.  Patient hemodynamically stable.  Pain persists despite IV Dilaudid and IV fluids.  Patient admitted to MedSurg and observation for further management of sickle cell pain crisis. Hospital Course:   Sickle cell disease with pain crisis: Patient was admitted for sickle cell pain crisis and managed appropriately with IVF, IV Dilaudid via PCA and IV Toradol, as well as other adjunct therapies per sickle cell pain management protocols.  Patient was very slow to respond to pain medication regimen.  Initially, patient's sickle cell related pain was characterized as generalized.  However, after several days pain was localized to left arm.  Left arm x-ray was unremarkable.  Patient characterizes pain as shooting with numbness and tingling to her fingertips.  Pain improved tremendously  following the addition of gabapentin 300 mg every 8 hours.  Patient will discharge home with this medication.  Gabapentin 300 mg #90 was sent to patient's pharmacy. Patient's long-acting opiate medications was adjusted.  Previously, she was on OxyContin 10 mg every  8 hours, transition to OxyContin 15 mg every 12 hours, OxyContin 15 mg #60 was sent to patient's pharmacy.  Also, oxycodone 15 mg every 4 hours #90 was sent to patient's pharmacy.  Patient is highly opiate tolerant.  Daily MME is around 180.  Pain intensity is currently 6/10, which has been much improved from previous. Patient has poorly controlled sickle cell disease.  Recommend follow-up with hematologist.  She may benefit from the addition of disease modifying agents.  Discussed Gardenia Phlegm and Adakveo with patient.  Patient has taken hydroxyurea in the past without success and is not interested in restarting.  Patient will discharge home today.  Sickle cell anemia: During admission, patient's hemoglobin decreased to 6.4, which is below her baseline of 8-9 g/dL.  Patient was transfused 2 units PRBCs.  Prior to discharge, hemoglobin 9.1 g/dL.  Patient advised to follow-up with PCP in 1 week to repeat CBC with differential.  She expressed understanding.  Patient is alert, oriented, and ambulating without assistance.  Oxygen saturation is 98% and she is afebrile.  Patient is aware of all upcoming appointments. She will follow-up with sickle cell day infusion clinic on 05/10/2021  Patient was therefore discharged home today in a hemodynamically stable condition.   Nalia will follow-up with PCP within 1 week of this discharge. Kamie was counseled extensively about nonpharmacologic means of pain management, patient verbalized understanding and was appreciative of  the care received during this admission.   We discussed the need for good hydration, monitoring of hydration status, avoidance of heat, cold, stress, and infection triggers. We discussed the need to be adherent with home medications.  Patient advised to continue to hydrate consistently being that weather is hot.  Avoid outdoor activities during periods of high his heat index.  Recommend around 64 ounces of water per day.  Patient was reminded of the  need to seek medical attention immediately if any symptom of bleeding, anemia, or infection occurs.  Discharge Exam: Vitals:   05/05/21 0523 05/05/21 1104  BP: 105/69 110/72  Pulse: 87 88  Resp: 16 18  Temp: 98.8 F (37.1 C) 99 F (37.2 C)  SpO2: 100% 99%   Vitals:   05/04/21 2119 05/05/21 0126 05/05/21 0523 05/05/21 1104  BP: (!) 106/56 120/62 105/69 110/72  Pulse: 75 74 87 88  Resp: 14 16 16 18   Temp: 98.3 F (36.8 C)  98.8 F (37.1 C) 99 F (37.2 C)  TempSrc: Oral Oral Oral Oral  SpO2: 100% 100% 100% 99%  Weight:      Height:        General appearance : Awake, alert, not in any distress. Speech Clear. Not toxic looking HEENT: Atraumatic and Normocephalic, pupils equally reactive to light and accomodation Neck: Supple, no JVD. No cervical lymphadenopathy.  Chest: Good air entry bilaterally, no added sounds  CVS: S1 S2 regular, no murmurs.  Abdomen: Bowel sounds present, Non tender and not distended with no guarding, rigidity or rebound. Extremities: B/L Lower Ext shows no edema, both legs are warm to touch Neurology: Awake alert, and oriented X 3, CN II-XII intact, Non focal Skin: No Rash  Discharge Instructions   Allergies as of 05/05/2021       Reactions   Kiwi Extract  Hives   Morphine And Related Hives   Paroxetine Hives        Medication List     STOP taking these medications    butalbital-acetaminophen-caffeine 50-325-40 MG tablet Commonly known as: FIORICET   famotidine 20 MG tablet Commonly known as: PEPCID   methocarbamol 500 MG tablet Commonly known as: ROBAXIN   predniSONE 10 MG (21) Tbpk tablet Commonly known as: STERAPRED UNI-PAK 21 TAB       TAKE these medications    folic acid 1 MG tablet Commonly known as: FOLVITE Take 1 tablet (1 mg total) by mouth daily.   gabapentin 300 MG capsule Commonly known as: NEURONTIN Take 1 capsule (300 mg total) by mouth 3 (three) times daily.   omeprazole 40 MG capsule Commonly known as:  PRILOSEC Take 1 capsule (40 mg total) by mouth daily.   OxyCONTIN 10 mg 12 hr tablet Generic drug: oxyCODONE Take 1 tablet (10 mg total) by mouth every 12 (twelve) hours. What changed:  when to take this reasons to take this   oxyCODONE 15 MG immediate release tablet Commonly known as: ROXICODONE Take 1 tablet (15 mg total) by mouth every 4 (four) hours as needed for up to 15 days. Start taking on: May 08, 2021 What changed: These instructions start on May 08, 2021. If you are unsure what to do until then, ask your doctor or other care provider.       ASK your doctor about these medications    cetirizine 10 MG tablet Commonly known as: ZYRTEC Take 1 tablet (10 mg total) by mouth daily.   Ensure Plus Liqd Take 237 mLs by mouth 3 (three) times daily between meals.   naproxen 500 MG tablet Commonly known as: NAPROSYN Take 1 tablet (500 mg total) by mouth 2 (two) times daily with a meal.   Vitamin D (Ergocalciferol) 1.25 MG (50000 UNIT) Caps capsule Commonly known as: DRISDOL Take 1 capsule (50,000 Units total) by mouth every 7 (seven) days.        The results of significant diagnostics from this hospitalization (including imaging, microbiology, ancillary and laboratory) are listed below for reference.    Significant Diagnostic Studies: DG Chest 2 View  Result Date: 04/28/2021 CLINICAL DATA:  Chest pain. EXAM: CHEST - 2 VIEW COMPARISON:  Apr 16, 2021. FINDINGS: The heart size and mediastinal contours are within normal limits. Both lungs are clear. The visualized skeletal structures are unremarkable. IMPRESSION: No active cardiopulmonary disease. Electronically Signed   By: Lupita RaiderJames  Green Jr M.D.   On: 04/28/2021 14:22   DG Forearm Left  Result Date: 05/03/2021 CLINICAL DATA:  Pain.  Sickle cell disease EXAM: LEFT FOREARM - 2 VIEW COMPARISON:  None. FINDINGS: Frontal and lateral views were obtained. No fracture or dislocation. Joint spaces appear normal. No erosion. There  is minus ulnar variance. IMPRESSION: No fracture or dislocation. No evident arthropathy. Minus ulnar variance. Electronically Signed   By: Bretta BangWilliam  Woodruff III M.D.   On: 05/03/2021 18:55   DG Chest Port 1 View  Result Date: 04/16/2021 CLINICAL DATA:  Low O2 sat sickle cell crisis EXAM: PORTABLE CHEST 1 VIEW COMPARISON:  08/27/2020 FINDINGS: No focal opacity or pleural effusion. Stable cardiomediastinal silhouette. No pneumothorax. IMPRESSION: No active disease. Electronically Signed   By: Jasmine PangKim  Fujinaga M.D.   On: 04/16/2021 23:52    Microbiology: No results found for this or any previous visit (from the past 240 hour(s)).   Labs: Basic Metabolic Panel: Recent Labs  Lab 04/28/21  1150 04/30/21 0652 05/02/21 0529 05/04/21 0711  NA 138 139 139 137  K 3.0* 3.6 3.8 4.1  CL 103 105 105 106  CO2 26 28 27 25   GLUCOSE 116* 95 101* 88  BUN 8 6 6  <5*  CREATININE 0.50 0.47 0.54 0.51  CALCIUM 8.7* 8.3* 8.9 8.8*   Liver Function Tests: Recent Labs  Lab 04/28/21 1150 04/30/21 0652 05/02/21 0529  AST 18 23 21   ALT 18 24 22   ALKPHOS 48 46 50  BILITOT 4.0* 5.5* 2.8*  PROT 7.0 5.9* 6.3*  ALBUMIN 4.2 3.2* 3.3*   No results for input(s): LIPASE, AMYLASE in the last 168 hours. No results for input(s): AMMONIA in the last 168 hours. CBC: Recent Labs  Lab 04/28/21 1150 04/30/21 0652 05/01/21 0910 05/02/21 0529 05/03/21 0532 05/04/21 0711  WBC 20.6* 16.7* 13.8* 11.0* 8.5 9.9  NEUTROABS 9.0* 8.9* 6.9 4.6  --   --   HGB 8.9* 6.4* 9.7* 9.2* 8.9* 9.1*  HCT 26.9* 19.4* 28.4* 28.0* 27.2* 28.2*  MCV 108.9* 104.3* 99.6 102.2* 101.1* 101.4*  PLT 369 314 292 283 268 252   Cardiac Enzymes: No results for input(s): CKTOTAL, CKMB, CKMBINDEX, TROPONINI in the last 168 hours. BNP: Invalid input(s): POCBNP CBG: No results for input(s): GLUCAP in the last 168 hours.  Time coordinating discharge: 35 minutes  Signed:  07/01/21  APRN, MSN, FNP-C Patient Care [facility] [address] [place], Nolon Nations [facility] [phone]  [facility] 05/05/2021, 11:37 AM

## 2021-05-05 NOTE — TOC Transition Note (Signed)
Transition of Care [facility]) - CM/SW Discharge Note   Patient Details  Name: Kara [name] MRN: [id number] Date of Birth: [birth year]  [facility]) CM/SW Contact:  Golda Acre, RN Phone Number: 05/05/2021, 2:35 PM   Clinical Narrative:    Discharged to return home with no needfs.  Orders checked for toc needs.   Final next level of care: Home/Self Care Barriers to Discharge: No Barriers Identified   Patient Goals and CMS Choice        Discharge Placement                       Discharge Plan and Services                                     Social Determinants of Health (SDOH) Interventions     Readmission Risk Interventions No flowsheet data found.

## 2021-05-12 ENCOUNTER — Ambulatory Visit: Payer: Medicare Other | Admitting: Nurse Practitioner

## 2021-05-18 ENCOUNTER — Telehealth: Payer: Self-pay

## 2021-05-18 ENCOUNTER — Ambulatory Visit (INDEPENDENT_AMBULATORY_CARE_PROVIDER_SITE_OTHER): Payer: Medicare Other | Admitting: Nurse Practitioner

## 2021-05-18 ENCOUNTER — Other Ambulatory Visit: Payer: Self-pay | Admitting: Family Medicine

## 2021-05-18 VITALS — BP 112/69 | HR 88 | Temp 97.9°F | Resp 18 | Ht 64.0 in | Wt 123.0 lb

## 2021-05-18 DIAGNOSIS — G8929 Other chronic pain: Secondary | ICD-10-CM

## 2021-05-18 DIAGNOSIS — Z0000 Encounter for general adult medical examination without abnormal findings: Secondary | ICD-10-CM | POA: Diagnosis not present

## 2021-05-18 DIAGNOSIS — F1190 Opioid use, unspecified, uncomplicated: Secondary | ICD-10-CM

## 2021-05-18 DIAGNOSIS — D571 Sickle-cell disease without crisis: Secondary | ICD-10-CM

## 2021-05-18 MED ORDER — OXYCODONE HCL 15 MG PO TABS: 15.0000 mg | ORAL_TABLET | ORAL | 0 refills | Status: DC | PRN

## 2021-05-18 NOTE — Telephone Encounter (Signed)
Oxycodone 15 mg 

## 2021-05-18 NOTE — Progress Notes (Signed)
Reviewed PDMP substance reporting system prior to prescribing opiate medications. No inconsistencies noted.   Meds ordered this encounter  Medications  . oxyCODONE (ROXICODONE) 15 MG immediate release tablet    Sig: Take 1 tablet (15 mg total) by mouth every 4 (four) hours as needed for up to 15 days.    Dispense:  90 tablet    Refill:  0    Order Specific Question:   Supervising Provider    Answer:   JEGEDE, OLUGBEMIGA E [[id number]]     Taurus Willis Moore Perez Dirico  APRN, MSN, FNP-C [facility] [address]  , [place] [address] [phone]  

## 2021-05-18 NOTE — Patient Instructions (Addendum)
Ms. Kara [name] , Thank you for taking time to come for your Medicare Wellness Visit. I appreciate your ongoing commitment to your health goals. Please review the following plan we discussed and let me know if I can assist you in the future.   These are the goals we discussed:  Goals      Quit Smoking     Quit Smoking        This is a list of the screening recommended for you and due dates:  Health Maintenance  Topic Date Due   COVID-19 Vaccine (1) Never done   Pneumococcal Vaccination (1 - PCV) Never done   Hepatitis C Screening: USPSTF Recommendation to screen - Ages 44-79 yo.  Never done   Tetanus Vaccine  Never done   Pap Smear  Never done   Flu Shot  06/20/2021   HIV Screening  Completed   HPV Vaccine  Aged Out   BASIC DENTAL CARE AT [facility] Dental hygiene students at [facility] perform basic dental care at a low cost.   Services Oral prophylaxis (teeth cleaning) Pit and fissure sealants Dental x-rays Fluoride treatments Plaque control instruction  To make an appointment call [phone] ext. [phone].  Dr. Lu Duffel B. [employer] [facility] [employer] [address] [place], [place] [address]  [facility]) [facility]   September 9-10, 2022 [facility] located  at [address], [place], [place] [address]  Services are free, provided by volunteer licensed providers Patients are treated on a first come, first served basis beginning at 6:00 am each day.  Each patient will receive a health screening, x-rays, and exam Offered dental procedures may include cleanings, fillings, teeth pulling or partial dentures for front teeth.  [email or web address]  Questions? Call [phone]  Fall Prevention in the Home, Adult Falls can cause injuries and can happen to people of all ages. There are many things you can do to make your home safe and to help prevent  falls. Ask forhelp when making these changes. What actions can I take to prevent falls? General Instructions Use good lighting in all rooms. Replace any light bulbs that burn out. Turn on the lights in dark areas. Use night-lights. Keep items that you use often in easy-to-reach places. Lower the shelves around your home if needed. Set up your furniture so you have a clear path. Avoid moving your furniture around. Do not have throw rugs or other things on the floor that can make you trip. Avoid walking on wet floors. If any of your floors are uneven, fix them. Add color or contrast paint or tape to clearly mark and help you see: Grab bars or handrails. First and last steps of staircases. Where the edge of each step is. If you use a stepladder: Make sure that it is fully opened. Do not climb a closed stepladder. Make sure the sides of the stepladder are locked in place. Ask someone to hold the stepladder while you use it. Know where your pets are when moving through your home. What can I do in the bathroom?     Keep the floor dry. Clean up any water on the floor right away. Remove soap buildup in the tub or shower. Use nonskid mats or decals on the floor of the tub or shower. Attach bath mats securely with double-sided, nonslip rug tape. If you need to sit down in the shower, use a plastic, nonslip stool. Install grab bars  by the toilet and in the tub and shower. Do not use towel bars as grab bars. What can I do in the bedroom? Make sure that you have a light by your bed that is easy to reach. Do not use any sheets or blankets for your bed that hang to the floor. Have a firm chair with side arms that you can use for support when you get dressed. What can I do in the kitchen? Clean up any spills right away. If you need to reach something above you, use a step stool with a grab bar. Keep electrical cords out of the way. Do not use floor polish or wax that makes floors slippery. What  can I do with my stairs? Do not leave any items on the stairs. Make sure that you have a light switch at the top and the bottom of the stairs. Make sure that there are handrails on both sides of the stairs. Fix handrails that are broken or loose. Install nonslip stair treads on all your stairs. Avoid having throw rugs at the top or bottom of the stairs. Choose a carpet that does not hide the edge of the steps on the stairs. Check carpeting to make sure that it is firmly attached to the stairs. Fix carpet that is loose or worn. What can I do on the outside of my home? Use bright outdoor lighting. Fix the edges of walkways and driveways and fix any cracks. Remove anything that might make you trip as you walk through a door, such as a raised step or threshold. Trim any bushes or trees on paths to your home. Check to see if handrails are loose or broken and that both sides of all steps have handrails. Install guardrails along the edges of any raised decks and porches. Clear paths of anything that can make you trip, such as tools or rocks. Have leaves, snow, or ice cleared regularly. Use sand or salt on paths during winter. Clean up any spills in your garage right away. This includes grease or oil spills. What other actions can I take? Wear shoes that: Have a low heel. Do not wear high heels. Have rubber bottoms. Feel good on your feet and fit well. Are closed at the toe. Do not wear open-toe sandals. Use tools that help you move around if needed. These include: Canes. Walkers. Scooters. Crutches. Review your medicines with your doctor. Some medicines can make you feel dizzy. This can increase your chance of falling. Ask your doctor what else you can do to help prevent falls. Where to find more information Centers for Disease Control and Prevention, [employer]: FootballExhibition.com.brwww.cdc.gov [employer] on Aging: https://walker.com/www.nia.nih.gov Contact a doctor if: You are afraid of falling at home. You feel weak,  drowsy, or dizzy at home. You fall at home. Summary There are many simple things that you can do to make your home safe and to help prevent falls. Ways to make your home safe include removing things that can make you trip and installing grab bars in the bathroom. Ask for help when making these changes in your home. This information is not intended to replace advice given to you by your health care provider. Make sure you discuss any questions you have with your healthcare provider. Document Revised: 06/09/2020 Document Reviewed: 06/09/2020 Elsevier Patient Education  2022 Elsevier Inc.  Health Maintenance, Female Adopting a healthy lifestyle and getting preventive care are important in promoting health and wellness. Ask your health care provider about: The right  schedule for you to have regular tests and exams. Things you can do on your own to prevent diseases and keep yourself healthy. What should I know about diet, weight, and exercise? Eat a healthy diet  Eat a diet that includes plenty of vegetables, fruits, low-fat dairy products, and lean protein. Do not eat a lot of foods that are high in solid fats, added sugars, or sodium.  Maintain a healthy weight Body mass index (BMI) is used to identify weight problems. It estimates body fat based on height and weight. Your health care provider can help determineyour BMI and help you achieve or maintain a healthy weight. Get regular exercise Get regular exercise. This is one of the most important things you can do for your health. Most adults should: Exercise for at least 150 minutes each week. The exercise should increase your heart rate and make you sweat (moderate-intensity exercise). Do strengthening exercises at least twice a week. This is in addition to the moderate-intensity exercise. Spend less time sitting. Even light physical activity can be beneficial. Watch cholesterol and blood lipids Have your blood tested for lipids and  cholesterol at 35 years of age, then havethis test every 5 years. Have your cholesterol levels checked more often if: Your lipid or cholesterol levels are high. You are older than 35 years of age. You are at high risk for heart disease. What should I know about cancer screening? Depending on your health history and family history, you may need to have cancer screening at various ages. This may include screening for: Breast cancer. Cervical cancer. Colorectal cancer. Skin cancer. Lung cancer. What should I know about heart disease, diabetes, and high blood pressure? Blood pressure and heart disease High blood pressure causes heart disease and increases the risk of stroke. This is more likely to develop in people who have high blood pressure readings, are of African descent, or are overweight. Have your blood pressure checked: Every 3-5 years if you are 19-38 years of age. Every year if you are 75 years old or older. Diabetes Have regular diabetes screenings. This checks your fasting blood sugar level. Have the screening done: Once every three years after age 63 if you are at a normal weight and have a low risk for diabetes. More often and at a younger age if you are overweight or have a high risk for diabetes. What should I know about preventing infection? Hepatitis B If you have a higher risk for hepatitis B, you should be screened for this virus. Talk with your health care provider to find out if you are at risk forhepatitis B infection. Hepatitis C Testing is recommended for: Everyone born from [birth year] through 1965. Anyone with known risk factors for hepatitis C. Sexually transmitted infections (STIs) Get screened for STIs, including gonorrhea and chlamydia, if: You are sexually active and are younger than 35 years of age. You are older than 35 years of age and your health care provider tells you that you are at risk for this type of infection. Your sexual activity has changed since  you were last screened, and you are at increased risk for chlamydia or gonorrhea. Ask your health care provider if you are at risk. Ask your health care provider about whether you are at high risk for HIV. Your health care provider may recommend a prescription medicine to help prevent HIV infection. If you choose to take medicine to prevent HIV, you should first get tested for HIV. You should then be tested every  3 months for as long as you are taking the medicine. Pregnancy If you are about to stop having your period (premenopausal) and you may become pregnant, seek counseling before you get pregnant. Take 400 to 800 micrograms (mcg) of folic acid every day if you become pregnant. Ask for birth control (contraception) if you want to prevent pregnancy. Osteoporosis and menopause Osteoporosis is a disease in which the bones lose minerals and strength with aging. This can result in bone fractures. If you are 85 years old or older, or if you are at risk for osteoporosis and fractures, ask your health care provider if you should: Be screened for bone loss. Take a calcium or vitamin D supplement to lower your risk of fractures. Be given hormone replacement therapy (HRT) to treat symptoms of menopause. Follow these instructions at home: Lifestyle Do not use any products that contain nicotine or tobacco, such as cigarettes, e-cigarettes, and chewing tobacco. If you need help quitting, ask your health care provider. Do not use street drugs. Do not share needles. Ask your health care provider for help if you need support or information about quitting drugs. Alcohol use Do not drink alcohol if: Your health care provider tells you not to drink. You are pregnant, may be pregnant, or are planning to become pregnant. If you drink alcohol: Limit how much you use to 0-1 drink a day. Limit intake if you are breastfeeding. Be aware of how much alcohol is in your drink. In the [place] one drink equals one 12 oz  bottle of beer (355 mL), one 5 oz glass of wine (148 mL), or one 1 oz glass of hard liquor (44 mL). General instructions Schedule regular health, dental, and eye exams. Stay current with your vaccines. Tell your health care provider if: You often feel depressed. You have ever been abused or do not feel safe at home. Summary Adopting a healthy lifestyle and getting preventive care are important in promoting health and wellness. Follow your health care provider's instructions about healthy diet, exercising, and getting tested or screened for diseases. Follow your health care provider's instructions on monitoring your cholesterol and blood pressure. This information is not intended to replace advice given to you by your health care provider. Make sure you discuss any questions you have with your healthcare provider. Document Revised: 10/30/2018 Document Reviewed: 10/30/2018 Elsevier Patient Education  2022 Elsevier Inc.  Steps to Quit Smoking Smoking tobacco is the leading cause of preventable death. It can affect almost every organ in the body. Smoking puts you and those around you at risk for developing many serious chronic diseases. Quitting smoking can be difficult, but it is one of the best things that you can do for your health. It is nevertoo late to quit. How do I get ready to quit? When you decide to quit smoking, create a plan to help you succeed. Before you quit: Pick a date to quit. Set a date within the next 2 weeks to give you time to prepare. Write down the reasons why you are quitting. Keep this list in places where you will see it often. Tell your family, friends, and co-workers that you are quitting. Support from your loved ones can make quitting easier. Talk with your health care provider about your options for quitting smoking. Find out what treatment options are covered by your health insurance. Identify people, places, things, and activities that make you want to smoke  (triggers). Avoid them. What first steps can I take to quit  smoking? Throw away all cigarettes at home, at work, and in your car. Throw away smoking accessories, such as Set designer. Clean your car. Make sure to empty the ashtray. Clean your home, including curtains and carpets. What strategies can I use to quit smoking? Talk with your health care provider about combining strategies, such as taking medicines while you are also receiving in-person counseling. Using these two strategies together makes you more likely to succeed in quitting than if you used either strategy on its own. If you are pregnant or breastfeeding, talk with your health care provider about finding counseling or other support strategies to quit smoking. Do not take medicine to help you quit smoking unless your health care provider tells you to do so. To quit smoking: Quit right away Quit smoking completely, instead of gradually reducing how much you smoke over a period of time. Research shows that stopping smoking right away is more successful than gradually quitting. Attend in-person counseling to help you build problem-solving skills. You are more likely to succeed in quitting if you attend counseling sessions regularly. Even short sessions of 10 minutes can be effective. Take medicine You may take medicines to help you quit smoking. Some medicines require a prescription and some you can purchase over-the-counter. Medicines may have nicotine in them to replace the nicotine in cigarettes. Medicines may: Help to stop cravings. Help to relieve withdrawal symptoms. Your health care provider may recommend: Nicotine patches, gum, or lozenges. Nicotine inhalers or sprays. Non-nicotine medicine that is taken by mouth. Find resources Find resources and support systems that can help you to quit smoking and remain smoke-free after you quit. These resources are most helpful when you use them often. They include: Online  chats with a Veterinary surgeon. Telephone quitlines. Printed Materials engineer. Support groups or group counseling. Text messaging programs. Mobile phone apps or applications. Use apps that can help you stick to your quit plan by providing reminders, tips, and encouragement. There are many free apps for mobile devices as well as websites. Examples include Quit Guide from the [employer] and smokefree.gov What things can I do to make it easier to quit?  Reach out to your family and friends for support and encouragement. Call telephone quitlines (1-800-QUIT-NOW), reach out to support groups, or work with a counselor for support. Ask people who smoke to avoid smoking around you. Avoid places that trigger you to smoke, such as bars, parties, or smoke-break areas at work. Spend time with people who do not smoke. Lessen the stress in your life. Stress can be a smoking trigger for some people. To lessen stress, try: Exercising regularly. Doing deep-breathing exercises. Doing yoga. Meditating. Performing a body scan. This involves closing your eyes, scanning your body from head to toe, and noticing which parts of your body are particularly tense. Try to relax the muscles in those areas. How will I feel when I quit smoking? Day 1 to 3 weeks Within the first 24 hours of quitting smoking, you may start to feel withdrawal symptoms. These symptoms are usually most noticeable 2-3 days after quitting, but they usually do not last for more than 2-3 weeks. You may experience these symptoms: Mood swings. Restlessness, anxiety, or irritability. Trouble concentrating. Dizziness. Strong cravings for sugary foods and nicotine. Mild weight gain. Constipation. Nausea. Coughing or a sore throat. Changes in how the medicines that you take for unrelated issues work in your body. Depression. Trouble sleeping (insomnia). Week 3 and afterward After the first 2-3 weeks of quitting,  you may start to notice more positive  results, such as: Improved sense of smell and taste. Decreased coughing and sore throat. Slower heart rate. Lower blood pressure. Clearer skin. The ability to breathe more easily. Fewer sick days. Quitting smoking can be very challenging. Do not get discouraged if you are not successful the first time. Some people need to make many attempts to quit before they achieve long-term success. Do your best to stick to your quit plan, and talk with your health care provider if you haveany questions or concerns. Summary Smoking tobacco is the leading cause of preventable death. Quitting smoking is one of the best things that you can do for your health. When you decide to quit smoking, create a plan to help you succeed. Quit smoking right away, not slowly over a period of time. When you start quitting, seek help from your health care provider, family, or friends. This information is not intended to replace advice given to you by your health care provider. Make sure you discuss any questions you have with your healthcare provider. Document Revised: 08/01/2019 Document Reviewed: 01/25/2019 Elsevier Patient Education  2022 [employer].

## 2021-05-18 NOTE — Progress Notes (Signed)
Subjective:   Kara [name] is a 35 y.o. female who presents for Medicare Annual (Subsequent) preventive examination.  Review of Systems    Review of Systems  Constitutional: Negative.   HENT: Negative.    Eyes: Negative.   Respiratory: Negative.    Cardiovascular: Negative.   Gastrointestinal: Negative.   Genitourinary: Negative.   Musculoskeletal: Negative.   Skin: Negative.   Neurological: Negative.   Endo/Heme/Allergies: Negative.   Psychiatric/Behavioral: Negative.     Cardiac Risk Factors include: smoking/ tobacco exposure;sedentary lifestyle     Objective:    Today's Vitals   05/18/21 1410  BP: 112/69  Pulse: 88  Resp: 18  Temp: 97.9 F (36.6 C)  Weight: 123 lb (55.8 kg)  Height: 5\' 4"  (1.626 m)   Body mass index is 21.11 kg/m.  Advanced Directives 04/28/2021 04/17/2021 04/16/2021 04/12/2021 12/01/2020 08/29/2020 08/28/2020  Does Patient Have a Medical Advance Directive? No - No No No - No  Would patient like information on creating a medical advance directive? No - Patient declined No - Patient declined - - No - Patient declined No - Guardian declined -    Current Medications (verified) Outpatient Encounter Medications as of 05/18/2021  Medication Sig   cetirizine (ZYRTEC) 10 MG tablet Take 1 tablet (10 mg total) by mouth daily. (Patient taking differently: Take 10 mg by mouth daily as needed for allergies.)   Ensure Plus (ENSURE PLUS) LIQD Take 237 mLs by mouth 3 (three) times daily between meals. (Patient taking differently: Take 237 mLs by mouth daily.)   folic acid (FOLVITE) 1 MG tablet Take 1 tablet (1 mg total) by mouth daily.   gabapentin (NEURONTIN) 300 MG capsule Take 1 capsule (300 mg total) by mouth 3 (three) times daily.   naloxone (NARCAN) nasal spray 4 mg/0.1 mL 1 spray in nostril, may use every 2-3 minutes in alternating nostrils while awaiting medical assistance   naproxen (NAPROSYN) 500 MG tablet Take 1 tablet (500 mg total) by mouth 2 (two)  times daily with a meal. (Patient taking differently: Take 500 mg by mouth 2 (two) times daily as needed for mild pain.)   omeprazole (PRILOSEC) 40 MG capsule Take 1 capsule (40 mg total) by mouth daily.   oxyCODONE (OXYCONTIN) 15 mg 12 hr tablet Take 1 tablet (15 mg total) by mouth every 12 (twelve) hours.   oxyCODONE (ROXICODONE) 15 MG immediate release tablet Take 1 tablet (15 mg total) by mouth every 4 (four) hours as needed for up to 15 days.   Vitamin D, Ergocalciferol, (DRISDOL) 1.25 MG (50000 UNIT) CAPS capsule Take 1 capsule (50,000 Units total) by mouth every 7 (seven) days. (Patient not taking: No sig reported)   No facility-administered encounter medications on file as of 05/18/2021.    Allergies (verified) Kiwi extract, Morphine and related, and Paroxetine   History: Past Medical History:  Diagnosis Date   Arthritis    Chronic migraine    GERD (gastroesophageal reflux disease)    Hx of cholecystectomy 2015   Pulmonary hypertension (HCC)    Sickle cell anemia (HCC)    Tendinitis    left elbow   Past Surgical History:  Procedure Laterality Date   CHOLECYSTECTOMY     DILATION AND CURETTAGE OF UTERUS     GALLBLADDER SURGERY     Family History  Adopted: Yes   Social History   Socioeconomic History   Marital status: Single    Spouse name: Not on file   Number of children: Not  on file   Years of education: Not on file   Highest education level: Not on file  Occupational History   Not on file  Tobacco Use   Smoking status: Every Day    Packs/day: 0.50    Pack years: 0.00    Types: Cigarettes   Smokeless tobacco: Never  Vaping Use   Vaping Use: Never used  Substance and Sexual Activity   Alcohol use: No   Drug use: Yes    Types: Marijuana   Sexual activity: Yes    Birth control/protection: None  Other Topics Concern   Not on file  Social History Narrative   Not on file   Social Determinants of Health   Financial Resource Strain: High Risk    Difficulty of Paying Living Expenses: Very hard  Food Insecurity: No Food Insecurity   Worried About Programme researcher, broadcasting/film/videounning Out of Food in the Last Year: Never true   Ran Out of Food in the Last Year: Never true  Transportation Needs: No Transportation Needs   Lack of Transportation (Medical): No   Lack of Transportation (Non-Medical): No  Physical Activity: Inactive   Days of Exercise per Week: 0 days   Minutes of Exercise per Session: 0 min  Stress: Stress Concern Present   Feeling of Stress : To some extent  Social Connections: Moderately Isolated   Frequency of Communication with Friends and Family: More than three times a week   Frequency of Social Gatherings with Friends and Family: Never   Attends Religious Services: Never   Database administratorActive Member of Clubs or Organizations: Yes   Attends Engineer, structuralClub or Organization Meetings: More than 4 times per year   Marital Status: Never married    Tobacco Counseling Ready to quit: Not Answered Counseling given: Yes   Clinical Intake:  Pre-visit preparation completed: No  Pain : No/denies pain     BMI - recorded: 21.11 Nutritional Status: BMI of 19-24  Normal Nutritional Risks: None Diabetes: No  How often do you need to have someone help you when you read instructions, pamphlets, or other written materials from your doctor or pharmacy?: 1 - Never What is the last grade level you completed in school?: 12th - plus college courses  Diabetic? no  Interpreter Needed?: No      Activities of Daily Living In your present state of health, do you have any difficulty performing the following activities: 05/18/2021 04/28/2021  Hearing? N N  Vision? N N  Difficulty concentrating or making decisions? N N  Walking or climbing stairs? N Y  Comment - secondary to pain  Dressing or bathing? N N  Doing errands, shopping? N N  Comment - -  Quarry managerreparing Food and eating ? N -  Using the Toilet? N -  In the past six months, have you accidently leaked urine? N -  Do you  have problems with loss of bowel control? N -  Managing your Medications? N -  Managing your Finances? N -  Housekeeping or managing your Housekeeping? N -  Some recent data might be hidden    Patient Care Team: Rounding, [facility], MD as PCP - General (Internal Medicine)  Indicate any recent Medical Services you may have received from other than [facility] providers in the past year (date may be approximate).     Assessment:   This is [facility] wellness [facility]  Hearing/Vision screen No results found.  Dietary issues and exercise activities discussed: Current Exercise Habits: The patient does not participate  in regular exercise at present   Goals Addressed             This Visit's Progress    Quit Smoking         Depression Screen PHQ 2/9 Scores 05/18/2021 08/27/2020 07/19/2020 07/14/2019 04/04/2018  PHQ - 2 Score 0 1 2 3 4   PHQ- 9 Score - - - 10 15  Exception Documentation - - Medical reason - -    Fall Risk Fall Risk  05/18/2021 12/01/2020 08/27/2020 07/19/2020 05/14/2020  Falls in the past year? 0 0 0 (No Data) 0  Comment - - - pt states she doesn't remeber. -  Number falls in past yr: 0 - 0 - 0  Injury with Fall? 0 - 0 - 0  Risk for fall due to : No Fall Risks - No Fall Risks - -  Follow up Education provided Falls evaluation completed - - -    FALL RISK PREVENTION PERTAINING TO THE HOME:  Any stairs in or around the home? Yes  If so, are there any without handrails? No  Home free of loose throw rugs in walkways, pet beds, electrical cords, etc? Yes  Adequate lighting in your home to reduce risk of falls? Yes   ASSISTIVE DEVICES UTILIZED TO PREVENT FALLS:  Life alert? No  Use of a cane, walker or w/c? No  Grab bars in the bathroom? No  Shower chair or bench in shower? No  Elevated toilet seat or a handicapped toilet? No   TIMED UP AND GO:  Was the test performed? Yes .  Length of time to ambulate 10 feet: 3 sec.   Gait steady and fast  without use of assistive device  Cognitive Function: MMSE - Mini Mental State Exam 05/18/2021  Orientation to time 5  Orientation to Place 5  Registration 3  Attention/ Calculation 5  Recall 3  Language- name 2 objects 2  Language- repeat 1  Language- follow 3 step command 3  Language- read & follow direction 1  Write a sentence 1  Copy design 1  Total score 30        Immunizations Immunization History  Administered Date(s) Administered   Influenza Split 09/18/2014    TDAP status: Due, Education has been provided regarding the importance of this vaccine. Advised may receive this vaccine at local pharmacy or Health Dept. Aware to provide a copy of the vaccination record if obtained from local pharmacy or Health Dept. Verbalized acceptance and understanding.  Flu Vaccine status: Due, Education has been provided regarding the importance of this vaccine. Advised may receive this vaccine at local pharmacy or Health Dept. Aware to provide a copy of the vaccination record if obtained from local pharmacy or Health Dept. Verbalized acceptance and understanding.  Pneumococcal vaccine status: Due, Education has been provided regarding the importance of this vaccine. Advised may receive this vaccine at local pharmacy or Health Dept. Aware to provide a copy of the vaccination record if obtained from local pharmacy or Health Dept. Verbalized acceptance and understanding.  Covid-19 vaccine status: Declined, Education has been provided regarding the importance of this vaccine but patient still declined. Advised may receive this vaccine at local pharmacy or Health Dept.or vaccine clinic. Aware to provide a copy of the vaccination record if obtained from local pharmacy or Health Dept. Verbalized acceptance and understanding.  Qualifies for Shingles Vaccine? No   Zostavax completed No   Shingrix Completed?: No.    Education has been provided regarding the importance  of this vaccine. Patient has been  advised to call insurance company to determine out of pocket expense if they have not yet received this vaccine. Advised may also receive vaccine at local pharmacy or Health Dept. Verbalized acceptance and understanding.  Screening Tests Health Maintenance  Topic Date Due   COVID-19 Vaccine (1) Never done   Pneumococcal Vaccine 31-68 Years old (1 - PCV) Never done   Hepatitis C Screening  Never done   TETANUS/TDAP  Never done   PAP SMEAR-Modifier  Never done   INFLUENZA VACCINE  06/20/2021   HIV Screening  Completed   HPV VACCINES  Aged Out    Health Maintenance  Health Maintenance Due  Topic Date Due   COVID-19 Vaccine (1) Never done   Pneumococcal Vaccine 54-16 Years old (1 - PCV) Never done   Hepatitis C Screening  Never done   TETANUS/TDAP  Never done   PAP SMEAR-Modifier  Never done    Colorectal screening: under age  Mammogram: under age  Bone Density: under age  Lung Cancer Screening: (Low Dose CT Chest recommended if Age 36-80 years, 30 pack-year currently smoking OR have quit w/in 15years.) does not qualify.   Lung Cancer Screening Referral: NA  Additional Screening:  Hepatitis C Screening: does not qualify; Completed  Vision Screening: Recommended annual ophthalmology exams for early detection of glaucoma and other disorders of the eye. Is the patient up to date with their annual eye exam?  Yes  Who is the provider or what is the name of the office in which the patient attends annual eye exams? [facility] If pt is not established with a provider, would they like to be referred to a provider to establish care?  NA .   Dental Screening: Recommended annual dental exams for proper oral hygiene  Community Resource Referral / Chronic Care Management: CRR required this visit?  No   CCM required this visit?  No      Plan:     I have personally reviewed and noted the following in the patient's chart:   Medical and social history Use of alcohol,  tobacco or illicit drugs  Current medications and supplements including opioid prescriptions.  Functional ability and status Nutritional status Physical activity Advanced directives List of other physicians Hospitalizations, surgeries, and ER visits in previous 12 months Vitals Screenings to include cognitive, depression, and falls Referrals and appointments  In addition, I have reviewed and discussed with patient certain preventive protocols, quality metrics, and best practice recommendations. A written personalized care plan for preventive services as well as general preventive health recommendations were provided to patient.     Ivonne Andrew, NP   05/18/2021

## 2021-06-01 ENCOUNTER — Telehealth (HOSPITAL_COMMUNITY): Payer: Self-pay

## 2021-06-01 NOTE — Telephone Encounter (Signed)
Pt called wanting to be seen in day hospital for sickle cell pain crisis. Pt informed that center is at capacity today per [place] FNP. Pt instructed to go to ED if care is needed today, or to call [facility] back in the morning as early as 8:00am, pt verbalized understanding.

## 2021-06-02 ENCOUNTER — Telehealth (HOSPITAL_COMMUNITY): Payer: Self-pay | Admitting: *Deleted

## 2021-06-02 ENCOUNTER — Non-Acute Institutional Stay (HOSPITAL_COMMUNITY)
Admission: AD | Admit: 2021-06-02 | Discharge: 2021-06-02 | Disposition: A | Discharge status: Home / Self Care | Payer: Medicare Other | Source: Ambulatory Visit | Attending: Internal Medicine | Admitting: Internal Medicine

## 2021-06-02 DIAGNOSIS — Z79899 Other long term (current) drug therapy: Secondary | ICD-10-CM | POA: Insufficient documentation

## 2021-06-02 DIAGNOSIS — Z596 Low income: Secondary | ICD-10-CM | POA: Insufficient documentation

## 2021-06-02 DIAGNOSIS — G8929 Other chronic pain: Secondary | ICD-10-CM

## 2021-06-02 DIAGNOSIS — F17210 Nicotine dependence, cigarettes, uncomplicated: Secondary | ICD-10-CM | POA: Insufficient documentation

## 2021-06-02 DIAGNOSIS — F1120 Opioid dependence, uncomplicated: Secondary | ICD-10-CM | POA: Diagnosis not present

## 2021-06-02 DIAGNOSIS — D5700 Hb-SS disease with crisis, unspecified: Secondary | ICD-10-CM | POA: Diagnosis present

## 2021-06-02 DIAGNOSIS — G894 Chronic pain syndrome: Secondary | ICD-10-CM | POA: Insufficient documentation

## 2021-06-02 DIAGNOSIS — D571 Sickle-cell disease without crisis: Secondary | ICD-10-CM

## 2021-06-02 DIAGNOSIS — F1190 Opioid use, unspecified, uncomplicated: Secondary | ICD-10-CM

## 2021-06-02 DIAGNOSIS — Z885 Allergy status to narcotic agent status: Secondary | ICD-10-CM | POA: Diagnosis not present

## 2021-06-02 LAB — CBC WITH DIFFERENTIAL/PLATELET
Abs Immature Granulocytes: 0.09 10*3/uL — ABNORMAL HIGH (ref 0.00–0.07)
Basophils Absolute: 0.1 10*3/uL (ref 0.0–0.1)
Basophils Relative: 1 %
Eosinophils Absolute: 0.3 10*3/uL (ref 0.0–0.5)
Eosinophils Relative: 3 %
HCT: 29.6 % — ABNORMAL LOW (ref 36.0–46.0)
Hemoglobin: 10 g/dL — ABNORMAL LOW (ref 12.0–15.0)
Immature Granulocytes: 1 %
Lymphocytes Relative: 41 %
Lymphs Abs: 4.5 10*3/uL — ABNORMAL HIGH (ref 0.7–4.0)
MCH: 33.1 pg (ref 26.0–34.0)
MCHC: 33.8 g/dL (ref 30.0–36.0)
MCV: 98 fL (ref 80.0–100.0)
Monocytes Absolute: 0.9 10*3/uL (ref 0.1–1.0)
Monocytes Relative: 8 %
Neutro Abs: 5 10*3/uL (ref 1.7–7.7)
Neutrophils Relative %: 46 %
Platelets: 326 10*3/uL (ref 150–400)
RBC: 3.02 MIL/uL — ABNORMAL LOW (ref 3.87–5.11)
RDW: 19.2 % — ABNORMAL HIGH (ref 11.5–15.5)
WBC: 10.8 10*3/uL — ABNORMAL HIGH (ref 4.0–10.5)
nRBC: 1.2 % — ABNORMAL HIGH (ref 0.0–0.2)

## 2021-06-02 LAB — RETICULOCYTES
Immature Retic Fract: 33.2 % — ABNORMAL HIGH (ref 2.3–15.9)
RBC.: 2.96 MIL/uL — ABNORMAL LOW (ref 3.87–5.11)
Retic Count, Absolute: 534 10*3/uL — ABNORMAL HIGH (ref 19.0–186.0)
Retic Ct Pct: 17.2 % — ABNORMAL HIGH (ref 0.4–3.1)

## 2021-06-02 LAB — COMPREHENSIVE METABOLIC PANEL
ALT: 17 U/L (ref 0–44)
AST: 29 U/L (ref 15–41)
Albumin: 4.3 g/dL (ref 3.5–5.0)
Alkaline Phosphatase: 49 U/L (ref 38–126)
Anion gap: 7 (ref 5–15)
BUN: <5 mg/dL — ABNORMAL LOW (ref 6–20)
CO2: 24 mmol/L (ref 22–32)
Calcium: 9.1 mg/dL (ref 8.9–10.3)
Chloride: 110 mmol/L (ref 98–111)
Creatinine, Ser: 0.43 mg/dL — ABNORMAL LOW (ref 0.44–1.00)
GFR, Estimated: >60 mL/min (ref >60)
Glucose, Bld: 95 mg/dL (ref 70–99)
Potassium: 3.5 mmol/L (ref 3.5–5.1)
Sodium: 141 mmol/L (ref 135–145)
Total Bilirubin: 2.5 mg/dL — ABNORMAL HIGH (ref 0.3–1.2)
Total Protein: 7.4 g/dL (ref 6.5–8.1)

## 2021-06-02 LAB — PREGNANCY, URINE: Preg Test, Ur: NEGATIVE

## 2021-06-02 MED ORDER — NALOXONE HCL 0.4 MG/ML IJ SOLN: 0.4000 mg | INTRAMUSCULAR | Status: DC | PRN

## 2021-06-02 MED ORDER — ONDANSETRON HCL 4 MG/2ML IJ SOLN
4.0000 mg | Freq: Four times a day (QID) | INTRAMUSCULAR | Status: DC | PRN
  Administered 2021-06-02: 4 mg via INTRAVENOUS
  Filled 2021-06-02: qty 2

## 2021-06-02 MED ORDER — DIPHENHYDRAMINE HCL 25 MG PO CAPS
25.0000 mg | ORAL_CAPSULE | ORAL | Status: DC | PRN
  Filled 2021-06-02: qty 1

## 2021-06-02 MED ORDER — SODIUM CHLORIDE 0.45 % IV SOLN: INTRAVENOUS | Status: DC

## 2021-06-02 MED ORDER — HYDROMORPHONE 1 MG/ML IV SOLN
INTRAVENOUS | Status: DC
  Administered 2021-06-02: 9 mg via INTRAVENOUS
  Filled 2021-06-02: qty 30

## 2021-06-02 MED ORDER — OXYCODONE HCL 15 MG PO TABS: 15.0000 mg | ORAL_TABLET | ORAL | 0 refills | Status: DC | PRN

## 2021-06-02 MED ORDER — ACETAMINOPHEN 500 MG PO TABS
1000.0000 mg | ORAL_TABLET | Freq: Once | ORAL | Status: AC
  Administered 2021-06-02: 1000 mg via ORAL
  Filled 2021-06-02: qty 2

## 2021-06-02 MED ORDER — SODIUM CHLORIDE 0.9% FLUSH: 9.0000 mL | INTRAVENOUS | Status: DC | PRN

## 2021-06-02 MED ORDER — OXYCODONE HCL ER 15 MG PO T12A: 15.0000 mg | EXTENDED_RELEASE_TABLET | Freq: Two times a day (BID) | ORAL | 0 refills | Status: DC

## 2021-06-02 MED ORDER — KETOROLAC TROMETHAMINE 30 MG/ML IJ SOLN
15.0000 mg | Freq: Once | INTRAMUSCULAR | Status: AC
  Administered 2021-06-02: 15 mg via INTRAVENOUS
  Filled 2021-06-02 (×2): qty 1

## 2021-06-02 NOTE — Telephone Encounter (Signed)
Patient called requesting to come to the day hospital for sickle cell pain. Patient reports back and lower extremity pain rated 7/10. Reports taking her last Oxycodone IR on last night. Patient is now out of her pain medications. COVID-19 screening done and patient denies all symptoms and exposures. Denies fever, chest pain, nausea, vomiting, diarrhea and abdominal pain. Patient reports that her neighbor will transport her at discharge. [place], FNP notified. Patient can come to the day hospital for pain management.

## 2021-06-02 NOTE — Progress Notes (Addendum)
Pt admitted to the day hospital for treatment of sickle cell pain crisis. Pt reported generalized pain rated 6 /10. Pt given PO Tylenol,IV zofran and toradol, placed on Dilaudid PCA, and hydrated with IV fluids. At discharge patient reported pain a 6/10, but reports pain is less intense than at arrival. Reviewed with pt when she can pick up pain medication refills, verbalized understanding. Pt given AVS.  Pt alert , oriented and ambulatory at discharge.

## 2021-06-02 NOTE — H&P (Signed)
[facility] History and Physical   Date: 06/02/2021  Patient name: Kara [name] Medical record number: [id number] Date of birth: January 03, 1986 Age: 35 y.o. Gender: female PCP: Rounding, Sickle Cell, MD  Attending physician: Quentin Angst, MD  Chief Complaint: Sickle cell pain  History of Present Illness: Kara [name] is a 35 year old female with medical history significant for sickle cell disease, chronic pain syndrome, and opiate dependence and tolerance who presents with complaints of generalized pain that is consistent with her typical pain crisis.  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  She currently rates pain as 10/10 unrelieved by oxycodone last taken this a.m.  She characterizes pain as constant and throbbing.  She endorses swelling to bilateral lower extremities.  She attributes swelling to gabapentin use.  She denies any fever, chills, or chest pain.  No shortness of breath, urinary symptoms, nausea, vomiting, or diarrhea.  No sick contacts, recent travel, or exposure to COVID-19.   Meds: Medications Prior to Admission  Medication Sig Dispense Refill Last Dose   cetirizine (ZYRTEC) 10 MG tablet Take 1 tablet (10 mg total) by mouth daily. (Patient taking differently: Take 10 mg by mouth daily as needed for allergies.) 30 tablet 11    Ensure Plus (ENSURE PLUS) LIQD Take 237 mLs by mouth 3 (three) times daily between meals. (Patient taking differently: Take 237 mLs by mouth daily.) 237 mL 6    folic acid (FOLVITE) 1 MG tablet Take 1 tablet (1 mg total) by mouth daily. 30 tablet 11    gabapentin (NEURONTIN) 300 MG capsule Take 1 capsule (300 mg total) by mouth 3 (three) times daily. 90 capsule 1    naloxone (NARCAN) nasal spray 4 mg/0.1 mL 1 spray in nostril, may use every 2-3 minutes in alternating nostrils while awaiting medical assistance 1 each 1    naproxen (NAPROSYN) 500 MG tablet Take 1 tablet (500 mg total) by mouth 2 (two) times  daily with a meal. (Patient taking differently: Take 500 mg by mouth 2 (two) times daily as needed for mild pain.) 180 tablet 3    omeprazole (PRILOSEC) 40 MG capsule Take 1 capsule (40 mg total) by mouth daily. 30 capsule 11    oxyCODONE (OXYCONTIN) 15 mg 12 hr tablet Take 1 tablet (15 mg total) by mouth every 12 (twelve) hours. 60 tablet 0    oxyCODONE (ROXICODONE) 15 MG immediate release tablet Take 1 tablet (15 mg total) by mouth every 4 (four) hours as needed for up to 15 days. 90 tablet 0    Vitamin D, Ergocalciferol, (DRISDOL) 1.25 MG (50000 UNIT) CAPS capsule Take 1 capsule (50,000 Units total) by mouth every 7 (seven) days. (Patient not taking: No sig reported) 5 capsule 2     Allergies: Kiwi extract, Morphine and related, and Paroxetine Past Medical History:  Diagnosis Date   Arthritis    Chronic migraine    GERD (gastroesophageal reflux disease)    Hx of cholecystectomy 2015   Pulmonary hypertension (HCC)    Sickle cell anemia (HCC)    Tendinitis    left elbow   Past Surgical History:  Procedure Laterality Date   CHOLECYSTECTOMY     DILATION AND CURETTAGE OF UTERUS     GALLBLADDER SURGERY     Family History  Adopted: Yes   Social History   Socioeconomic History   Marital status: Single    Spouse name: Not on file   Number of children: Not on file   Years  of education: Not on file   Highest education level: Not on file  Occupational History   Not on file  Tobacco Use   Smoking status: Every Day    Packs/day: 0.50    Types: Cigarettes   Smokeless tobacco: Never  Vaping Use   Vaping Use: Never used  Substance and Sexual Activity   Alcohol use: No   Drug use: Yes    Types: Marijuana   Sexual activity: Yes    Birth control/protection: None  Other Topics Concern   Not on file  Social History Narrative   Not on file   Social Determinants of Health   Financial Resource Strain: High Risk   Difficulty of Paying Living Expenses: Very hard  Food Insecurity:  No Food Insecurity   Worried About Programme researcher, broadcasting/film/video in the Last Year: Never true   Ran Out of Food in the Last Year: Never true  Transportation Needs: No Transportation Needs   Lack of Transportation (Medical): No   Lack of Transportation (Non-Medical): No  Physical Activity: Inactive   Days of Exercise per Week: 0 days   Minutes of Exercise per Session: 0 min  Stress: Stress Concern Present   Feeling of Stress : To some extent  Social Connections: Moderately Isolated   Frequency of Communication with Friends and Family: More than three times a week   Frequency of Social Gatherings with Friends and Family: Never   Attends Religious Services: Never   Database administrator or Organizations: Yes   Attends Engineer, structural: More than 4 times per year   Marital Status: Never married  Intimate Partner Violence: Not on file   Review of Systems  Constitutional: Negative.   HENT: Negative.    Eyes: Negative.   Respiratory: Negative.    Cardiovascular: Negative.   Gastrointestinal: Negative.   Genitourinary: Negative.   Musculoskeletal:  Positive for back pain and joint pain.  Skin: Negative.   Neurological: Negative.   Psychiatric/Behavioral: Negative.      Physical Exam: There were no vitals taken for this visit. Physical Exam Constitutional:      Appearance: Normal appearance.  Eyes:     Pupils: Pupils are equal, round, and reactive to light.  Cardiovascular:     Rate and Rhythm: Normal rate and regular rhythm.     Pulses: Normal pulses.  Pulmonary:     Effort: Pulmonary effort is normal.  Abdominal:     General: Bowel sounds are normal.  Musculoskeletal:        General: Normal range of motion.  Skin:    General: Skin is warm.  Neurological:     General: No focal deficit present.     Mental Status: She is alert. Mental status is at baseline.  Psychiatric:        Mood and Affect: Mood normal.        Behavior: Behavior normal.        Thought Content:  Thought content normal.        Judgment: Judgment normal.     Lab results: No results found for this or any previous visit (from the past 24 hour(s)).  Imaging results:  No results found.   Assessment & Plan: Patient admitted to sickle cell day infusion center for management of pain crisis.  Patient is opiate tolerant Initiate IV dilaudid PCA. Settings of 0.5 mg, 10 minute lockout, and 3 mg/hr IV fluids, 0.45% saline at 100 ml/hr Toradol 15 mg IV times one dose Tylenol  1000 mg by mouth times one dose Review CBC with differential, complete metabolic panel, and reticulocytes as results become available. Pain intensity will be reevaluated in context of functioning and relationship to baseline as care progresses If pain intensity remains elevated and/or sudden change in hemodynamic stability transition to inpatient services for higher level of care.     Nolon Nations  APRN, MSN, FNP-C Patient Care [facility] [address] [place], [place] [address] [phone]   06/02/2021, 10:54 AM

## 2021-06-03 ENCOUNTER — Telehealth: Payer: Self-pay

## 2021-06-03 ENCOUNTER — Other Ambulatory Visit: Payer: Self-pay | Admitting: Family Medicine

## 2021-06-03 NOTE — Discharge Summary (Signed)
[facility] Discharge Summary   Patient ID: Kara [name] MRN: [id number] DOB/AGE: January 07, 1986 35 y.o.  Admit date: 06/02/2021 Discharge date: 06/02/2021  Primary Care Physician:  Bonney Aid, MD  Admission Diagnoses:  Active Problems:   Sickle cell pain crisis [facility])    Discharge Medications:  Allergies as of 06/02/2021       Reactions   Kiwi Extract Hives   Morphine And Related Hives   Paroxetine Hives        Medication List     TAKE these medications    folic acid 1 MG tablet Commonly known as: FOLVITE Take 1 tablet (1 mg total) by mouth daily.   gabapentin 300 MG capsule Commonly known as: NEURONTIN Take 1 capsule (300 mg total) by mouth 3 (three) times daily.   naloxone 4 MG/0.1ML Liqd nasal spray kit Commonly known as: NARCAN 1 spray in nostril, may use every 2-3 minutes in alternating nostrils while awaiting medical assistance   omeprazole 40 MG capsule Commonly known as: PRILOSEC Take 1 capsule (40 mg total) by mouth daily.   oxyCODONE 15 mg 12 hr tablet Commonly known as: OxyCONTIN Take 1 tablet (15 mg total) by mouth every 12 (twelve) hours. Start taking on: June 04, 2021 What changed: Another medication with the same name was changed. Make sure you understand how and when to take each.   oxyCODONE 15 MG immediate release tablet Commonly known as: ROXICODONE Take 1 tablet (15 mg total) by mouth every 4 (four) hours as needed for up to 15 days. Start taking on: June 04, 2021 What changed: These instructions start on June 04, 2021. If you are unsure what to do until then, ask your doctor or other care provider.       ASK your doctor about these medications    cetirizine 10 MG tablet Commonly known as: ZYRTEC Take 1 tablet (10 mg total) by mouth daily.   Ensure Plus Liqd Take 237 mLs by mouth 3 (three) times daily between meals.   naproxen 500 MG tablet Commonly known as: NAPROSYN Take 1 tablet (500 mg total)  by mouth 2 (two) times daily with a meal.   Vitamin D (Ergocalciferol) 1.25 MG (50000 UNIT) Caps capsule Commonly known as: DRISDOL Take 1 capsule (50,000 Units total) by mouth every 7 (seven) days.         Consults:  None  Significant Diagnostic Studies:  No results found.  History of present illness:  Kara [name] is a 35 year old female with medical history significant for sickle cell disease, chronic pain syndrome, and opiate dependence and tolerance who presents with complaints of generalized pain that is consistent with her typical pain crisis.  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  She currently rates pain as 10/10 unrelieved by oxycodone last taken this a.m.  She characterizes pain as constant and throbbing.  She endorses swelling to bilateral lower extremities.  She attributes swelling to gabapentin use.  She denies any fever, chills, or chest pain.  No shortness of breath, urinary symptoms, nausea, vomiting, or diarrhea.  No sick contacts, recent travel, or exposure to COVID-19. [facility] Course: Reviewed all laboratory values.  Labs are largely consistent with patient's baseline. Pain managed with IV Dilaudid PCA IV Toradol 15 mg x 1 Tylenol 1000 mg x 1 IV fluids, 0.45% saline at 100 mL/h Patient's pain decreased throughout admission, she rates pain a 6/10.  She is alert, oriented, and ambulating without assistance. She states  that she does not have home pain medications.  Oxycodone 15 mg #90 sent to patient's pharmacy, however the earliest she can fill this prescription is 06/04/2021.  Also, prescription sent for OxyContin 15 mg every 12 hours.  Patient advised to resume home medications. She will discharge home in a hemodynamically stable condition.  Discharge instructions: Resume all home medications.   Follow up with PCP as previously  scheduled.   Discussed the importance of drinking 64 ounces of water daily, dehydration of red  blood cells may lead further sickling.   Avoid all stressors that precipitate sickle cell pain crisis.     The patient was given clear instructions to go to ER or return to medical center if symptoms do not improve, worsen or new problems develop.    Physical Exam at Discharge:  BP (!) 100/57 (BP Location: Left Arm)   Pulse 72   Temp 98.2 F (36.8 C) (Temporal)   Resp 16   LMP 05/22/2021 (Exact Date)   SpO2 100%   Physical Exam Constitutional:      Appearance: Normal appearance.  HENT:     Mouth/Throat:     Mouth: Mucous membranes are moist.  Eyes:     Pupils: Pupils are equal, round, and reactive to light.  Cardiovascular:     Rate and Rhythm: Normal rate and regular rhythm.     Pulses: Normal pulses.  Pulmonary:     Effort: Pulmonary effort is normal.  Abdominal:     General: Abdomen is flat. Bowel sounds are normal.  Musculoskeletal:        General: Normal range of motion.  Skin:    General: Skin is warm.  Neurological:     General: No focal deficit present.     Mental Status: She is alert. Mental status is at baseline.  Psychiatric:        Mood and Affect: Mood normal.        Behavior: Behavior normal.        Thought Content: Thought content normal.        Judgment: Judgment normal.      Disposition at Discharge: Discharge disposition: 01-Home or Self Care       Discharge Orders: Discharge Instructions     Discharge patient   Complete by: As directed    Discharge disposition: 01-Home or Self Care   Discharge patient date: 06/02/2021       Condition at Discharge:   Stable  Time spent on Discharge:  Greater than 30 minutes.  Signed: Donia Pounds  APRN, MSN, FNP-C [facility] [address] [place], [place] [address] [phone]  06/03/2021, 5:44 PM

## 2021-06-03 NOTE — Telephone Encounter (Signed)
Oxycodone Oxycontin 

## 2021-06-08 ENCOUNTER — Telehealth (HOSPITAL_COMMUNITY): Payer: Self-pay

## 2021-06-08 NOTE — Telephone Encounter (Signed)
Pt called wanting to be seen in the day hospital for sickle cell pain management. Pt informed that per [place] FNP, center is at capacity today and pt can go to ED for care if needed. Pt verbalized understanding. Pt stated she would call back in the morning, pt informed that center will only have 1 RN at this time and it will be provider's discretion if she can receive treatment at day hospital, pt verbalized understanding.

## 2021-06-16 ENCOUNTER — Telehealth: Payer: Self-pay

## 2021-06-16 ENCOUNTER — Other Ambulatory Visit: Payer: Self-pay | Admitting: Family Medicine

## 2021-06-16 DIAGNOSIS — G8929 Other chronic pain: Secondary | ICD-10-CM

## 2021-06-16 DIAGNOSIS — F1190 Opioid use, unspecified, uncomplicated: Secondary | ICD-10-CM

## 2021-06-16 DIAGNOSIS — D571 Sickle-cell disease without crisis: Secondary | ICD-10-CM

## 2021-06-16 MED ORDER — OXYCODONE HCL 15 MG PO TABS: 15.0000 mg | ORAL_TABLET | ORAL | 0 refills | Status: AC | PRN

## 2021-06-16 NOTE — Progress Notes (Signed)
Reviewed PDMP substance reporting system prior to prescribing opiate medications. No inconsistencies noted.   Meds ordered this encounter  Medications  . oxyCODONE (ROXICODONE) 15 MG immediate release tablet    Sig: Take 1 tablet (15 mg total) by mouth every 4 (four) hours as needed for up to 15 days.    Dispense:  90 tablet    Refill:  0    Order Specific Question:   Supervising Provider    Answer:   JEGEDE, OLUGBEMIGA E [[id number]]     Emilee Market Moore Treylan Mcclintock  APRN, MSN, FNP-C [facility] [address]  [place], [place] [address] [phone]  

## 2021-06-16 NOTE — Telephone Encounter (Signed)
Oxycodone 15 mg 

## 2021-06-20 ENCOUNTER — Telehealth: Payer: Self-pay

## 2021-06-20 NOTE — Telephone Encounter (Signed)
Pt called in and said if you can get her a room? I told her I would message you.  She was calling for DAY hospital today just [facility]

## 2021-06-21 ENCOUNTER — Other Ambulatory Visit: Payer: Self-pay

## 2021-06-21 ENCOUNTER — Encounter (HOSPITAL_COMMUNITY): Payer: Self-pay

## 2021-06-21 ENCOUNTER — Ambulatory Visit: Payer: Medicare Other | Admitting: Family Medicine

## 2021-06-21 ENCOUNTER — Inpatient Hospital Stay (HOSPITAL_COMMUNITY)
Admission: EM | Admit: 2021-06-21 | Discharge: 2021-06-28 | DRG: 811 | Disposition: A | Discharge status: Home / Self Care | Payer: Medicare Other | Attending: Internal Medicine | Admitting: Internal Medicine

## 2021-06-21 ENCOUNTER — Emergency Department (HOSPITAL_COMMUNITY): Payer: Medicare Other

## 2021-06-21 DIAGNOSIS — F17210 Nicotine dependence, cigarettes, uncomplicated: Secondary | ICD-10-CM | POA: Diagnosis present

## 2021-06-21 DIAGNOSIS — Z91018 Allergy to other foods: Secondary | ICD-10-CM

## 2021-06-21 DIAGNOSIS — D5700 Hb-SS disease with crisis, unspecified: Secondary | ICD-10-CM | POA: Diagnosis not present

## 2021-06-21 DIAGNOSIS — D638 Anemia in other chronic diseases classified elsewhere: Secondary | ICD-10-CM | POA: Diagnosis present

## 2021-06-21 DIAGNOSIS — Z885 Allergy status to narcotic agent status: Secondary | ICD-10-CM

## 2021-06-21 DIAGNOSIS — Z79899 Other long term (current) drug therapy: Secondary | ICD-10-CM

## 2021-06-21 DIAGNOSIS — M1990 Unspecified osteoarthritis, unspecified site: Secondary | ICD-10-CM | POA: Diagnosis present

## 2021-06-21 DIAGNOSIS — I959 Hypotension, unspecified: Secondary | ICD-10-CM

## 2021-06-21 DIAGNOSIS — R439 Unspecified disturbances of smell and taste: Secondary | ICD-10-CM | POA: Diagnosis present

## 2021-06-21 DIAGNOSIS — Z79891 Long term (current) use of opiate analgesic: Secondary | ICD-10-CM

## 2021-06-21 DIAGNOSIS — G894 Chronic pain syndrome: Secondary | ICD-10-CM | POA: Diagnosis present

## 2021-06-21 DIAGNOSIS — F411 Generalized anxiety disorder: Secondary | ICD-10-CM | POA: Diagnosis present

## 2021-06-21 DIAGNOSIS — Z28310 Unvaccinated for covid-19: Secondary | ICD-10-CM

## 2021-06-21 DIAGNOSIS — U071 COVID-19: Secondary | ICD-10-CM

## 2021-06-21 DIAGNOSIS — G43909 Migraine, unspecified, not intractable, without status migrainosus: Secondary | ICD-10-CM | POA: Diagnosis present

## 2021-06-21 DIAGNOSIS — I272 Pulmonary hypertension, unspecified: Secondary | ICD-10-CM | POA: Diagnosis present

## 2021-06-21 DIAGNOSIS — K219 Gastro-esophageal reflux disease without esophagitis: Secondary | ICD-10-CM | POA: Diagnosis present

## 2021-06-21 DIAGNOSIS — Z9049 Acquired absence of other specified parts of digestive tract: Secondary | ICD-10-CM

## 2021-06-21 DIAGNOSIS — Z888 Allergy status to other drugs, medicaments and biological substances status: Secondary | ICD-10-CM

## 2021-06-21 DIAGNOSIS — R112 Nausea with vomiting, unspecified: Secondary | ICD-10-CM | POA: Diagnosis present

## 2021-06-21 LAB — CBC WITH DIFFERENTIAL/PLATELET
Abs Immature Granulocytes: 0.19 10*3/uL — ABNORMAL HIGH (ref 0.00–0.07)
Basophils Absolute: 0.1 10*3/uL (ref 0.0–0.1)
Basophils Relative: 0 %
Eosinophils Absolute: 0 10*3/uL (ref 0.0–0.5)
Eosinophils Relative: 0 %
HCT: 26.1 % — ABNORMAL LOW (ref 36.0–46.0)
Hemoglobin: 9.1 g/dL — ABNORMAL LOW (ref 12.0–15.0)
Immature Granulocytes: 2 %
Lymphocytes Relative: 37 %
Lymphs Abs: 4.7 10*3/uL — ABNORMAL HIGH (ref 0.7–4.0)
MCH: 32.2 pg (ref 26.0–34.0)
MCHC: 34.9 g/dL (ref 30.0–36.0)
MCV: 92.2 fL (ref 80.0–100.0)
Monocytes Absolute: 1.1 10*3/uL — ABNORMAL HIGH (ref 0.1–1.0)
Monocytes Relative: 8 %
Neutro Abs: 6.8 10*3/uL (ref 1.7–7.7)
Neutrophils Relative %: 53 %
Platelets: 306 10*3/uL (ref 150–400)
RBC: 2.83 MIL/uL — ABNORMAL LOW (ref 3.87–5.11)
RDW: 19.9 % — ABNORMAL HIGH (ref 11.5–15.5)
WBC: 12.8 10*3/uL — ABNORMAL HIGH (ref 4.0–10.5)
nRBC: 13.6 % — ABNORMAL HIGH (ref 0.0–0.2)

## 2021-06-21 LAB — COMPREHENSIVE METABOLIC PANEL
ALT: 21 U/L (ref 0–44)
AST: 67 U/L — ABNORMAL HIGH (ref 15–41)
Albumin: 4.4 g/dL (ref 3.5–5.0)
Alkaline Phosphatase: 51 U/L (ref 38–126)
Anion gap: 7 (ref 5–15)
BUN: 6 mg/dL (ref 6–20)
CO2: 29 mmol/L (ref 22–32)
Calcium: 8.7 mg/dL — ABNORMAL LOW (ref 8.9–10.3)
Chloride: 99 mmol/L (ref 98–111)
Creatinine, Ser: 0.5 mg/dL (ref 0.44–1.00)
GFR, Estimated: >60 mL/min (ref >60)
Glucose, Bld: 106 mg/dL — ABNORMAL HIGH (ref 70–99)
Potassium: 3.6 mmol/L (ref 3.5–5.1)
Sodium: 135 mmol/L (ref 135–145)
Total Bilirubin: 6.4 mg/dL — ABNORMAL HIGH (ref 0.3–1.2)
Total Protein: 8 g/dL (ref 6.5–8.1)

## 2021-06-21 LAB — RESP PANEL BY RT-PCR (FLU A&B, COVID) ARPGX2
Influenza A by PCR: NEGATIVE
Influenza B by PCR: NEGATIVE
SARS Coronavirus 2 by RT PCR: POSITIVE — AB

## 2021-06-21 LAB — I-STAT BETA HCG BLOOD, ED (MC, WL, AP ONLY): I-stat hCG, quantitative: <5 m[IU]/mL (ref <5)

## 2021-06-21 LAB — RETICULOCYTES
Immature Retic Fract: 11.5 % (ref 2.3–15.9)
RBC.: 2.8 MIL/uL — ABNORMAL LOW (ref 3.87–5.11)
Retic Count, Absolute: 450 10*3/uL — ABNORMAL HIGH (ref 19.0–186.0)
Retic Ct Pct: 15.5 % — ABNORMAL HIGH (ref 0.4–3.1)

## 2021-06-21 LAB — URINALYSIS, ROUTINE W REFLEX MICROSCOPIC
Bilirubin Urine: NEGATIVE
Glucose, UA: NEGATIVE mg/dL
Ketones, ur: NEGATIVE mg/dL
Nitrite: NEGATIVE
Protein, ur: 100 mg/dL — AB
Specific Gravity, Urine: 1.013 (ref 1.005–1.030)
pH: 5 (ref 5.0–8.0)

## 2021-06-21 MED ORDER — SODIUM CHLORIDE 0.9% FLUSH: 9.0000 mL | INTRAVENOUS | Status: DC | PRN

## 2021-06-21 MED ORDER — ONDANSETRON 8 MG PO TBDP
8.0000 mg | ORAL_TABLET | Freq: Once | ORAL | Status: AC
  Administered 2021-06-21: 8 mg via ORAL
  Filled 2021-06-21: qty 1

## 2021-06-21 MED ORDER — KETOROLAC TROMETHAMINE 15 MG/ML IJ SOLN
15.0000 mg | INTRAMUSCULAR | Status: AC
  Administered 2021-06-21: 15 mg via INTRAVENOUS
  Filled 2021-06-21: qty 1

## 2021-06-21 MED ORDER — ENOXAPARIN SODIUM 40 MG/0.4ML IJ SOSY
40.0000 mg | PREFILLED_SYRINGE | INTRAMUSCULAR | Status: DC
  Filled 2021-06-21 (×5): qty 0.4

## 2021-06-21 MED ORDER — DEXTROSE-NACL 5-0.45 % IV SOLN: INTRAVENOUS | Status: DC

## 2021-06-21 MED ORDER — HYDROMORPHONE HCL 2 MG/ML IJ SOLN
2.0000 mg | INTRAMUSCULAR | Status: AC
  Administered 2021-06-21: 2 mg via INTRAVENOUS
  Filled 2021-06-21: qty 1

## 2021-06-21 MED ORDER — POLYETHYLENE GLYCOL 3350 17 G PO PACK: 17.0000 g | PACK | Freq: Every day | ORAL | Status: DC | PRN

## 2021-06-21 MED ORDER — NALOXONE HCL 0.4 MG/ML IJ SOLN: 0.4000 mg | INTRAMUSCULAR | Status: DC | PRN

## 2021-06-21 MED ORDER — ONDANSETRON HCL 4 MG/2ML IJ SOLN: 4.0000 mg | Freq: Four times a day (QID) | INTRAMUSCULAR | Status: DC | PRN

## 2021-06-21 MED ORDER — SENNOSIDES-DOCUSATE SODIUM 8.6-50 MG PO TABS
1.0000 | ORAL_TABLET | Freq: Two times a day (BID) | ORAL | Status: DC
  Administered 2021-06-22 – 2021-06-28 (×13): 1 via ORAL
  Filled 2021-06-21 (×13): qty 1

## 2021-06-21 MED ORDER — DIPHENHYDRAMINE HCL 25 MG PO CAPS: 25.0000 mg | ORAL_CAPSULE | ORAL | Status: DC | PRN

## 2021-06-21 MED ORDER — SODIUM CHLORIDE 0.9 % IV SOLN
25.0000 mg | INTRAVENOUS | Status: DC | PRN
  Filled 2021-06-21: qty 0.5

## 2021-06-21 MED ORDER — HYDROMORPHONE HCL 1 MG/ML IJ SOLN
1.0000 mg | INTRAMUSCULAR | Status: DC | PRN
  Administered 2021-06-21: 1 mg via INTRAVENOUS
  Filled 2021-06-21: qty 1

## 2021-06-21 MED ORDER — HYDROMORPHONE 1 MG/ML IV SOLN
INTRAVENOUS | Status: DC
  Administered 2021-06-22: 5.5 mg via INTRAVENOUS
  Administered 2021-06-23: 4 mg via INTRAVENOUS
  Administered 2021-06-23 (×2): 1.5 mg via INTRAVENOUS
  Administered 2021-06-23: 2.5 mg via INTRAVENOUS
  Administered 2021-06-23: 1 mg via INTRAVENOUS
  Administered 2021-06-23: 3 mg via INTRAVENOUS
  Administered 2021-06-24: 4.5 mg via INTRAVENOUS
  Administered 2021-06-24: 1 mg via INTRAVENOUS
  Administered 2021-06-24: 3.5 mg via INTRAVENOUS
  Administered 2021-06-24: 3 mg via INTRAVENOUS
  Administered 2021-06-24: 3.5 mg via INTRAVENOUS
  Administered 2021-06-24: 17.8 mg via INTRAVENOUS
  Administered 2021-06-24: 3 mg via INTRAVENOUS
  Administered 2021-06-25: 0.5 mg via INTRAVENOUS
  Administered 2021-06-25: 1.5 mg via INTRAVENOUS
  Administered 2021-06-25: 5 mg via INTRAVENOUS
  Administered 2021-06-25: 5.5 mg via INTRAVENOUS
  Administered 2021-06-26: 1 mg via INTRAVENOUS
  Administered 2021-06-26: 3 mg via INTRAVENOUS
  Administered 2021-06-26: 7 mg via INTRAVENOUS
  Administered 2021-06-28: 1 mg via INTRAVENOUS
  Administered 2021-06-28: 2 mg via INTRAVENOUS
  Filled 2021-06-21 (×4): qty 30

## 2021-06-21 MED ORDER — SODIUM CHLORIDE 0.9 % IV SOLN
25.0000 mg | Freq: Once | INTRAVENOUS | Status: AC
  Administered 2021-06-21: 25 mg via INTRAVENOUS
  Filled 2021-06-21: qty 25

## 2021-06-21 MED ORDER — MORPHINE SULFATE (PF) 4 MG/ML IV SOLN
4.0000 mg | INTRAVENOUS | Status: DC | PRN
Start: 2021-06-21 — End: 2021-06-22

## 2021-06-21 NOTE — ED Notes (Signed)
Pt informed need for urine specimen. 

## 2021-06-21 NOTE — ED Triage Notes (Addendum)
Patient c/o sickle cell pain "all over" x 4 days . Patient also c/o emesis with solid food, but able to keep liquids down.  Patient also c/o sore throat and runny nose.

## 2021-06-21 NOTE — ED Notes (Signed)
Pt able to tolerate PO intake without difficulty at this time.

## 2021-06-21 NOTE — ED Provider Notes (Signed)
Emergency Medicine Provider Triage Evaluation Note  Kara [name] , a 35 y.o. female  was evaluated in triage.  Pt complains of a sickle cell pain crisis.  Patient states that for about 4 days she has been experiencing pain all over.  Denies chest pain or shortness of breath but does note nausea/vomiting when eating solid food.  She has been able to tolerate liquids.  Also reports rhinorrhea, productive cough, as well as sore throat.  She has been taking her home oxycodone without relief.  States that she called the sickle cell clinic but did not get a call back so she came to the ED.  Physical Exam  BP 111/81 (BP Location: Left Arm)   Pulse 80   Temp 98.4 F (36.9 C) (Oral)   Resp 16   Ht 5\' 4"  (1.626 m)   Wt 56.7 kg   LMP 05/22/2021 (Exact Date)   SpO2 94%   BMI 21.46 kg/m  Gen:   Awake, no distress   Resp:  Normal effort  MSK:   Moves extremities without difficulty  Other:    Medical Decision Making  Medically screening exam initiated at 12:57 PM.  Appropriate orders placed.  07/23/2021 was informed that the remainder of the evaluation will be completed by another provider, this initial triage assessment does not replace that evaluation, and the importance of remaining in the ED until their evaluation is complete.   [name], PA-C 06/21/21 1258    08/21/21, MD 06/22/21 [id number]

## 2021-06-21 NOTE — ED Provider Notes (Signed)
[facility]-EMERGENCY DEPT Provider Note   CSN: [id number] Arrival date & time: 06/21/21  1233     History Chief Complaint  Patient presents with   Sickle Cell Pain Crisis    Kara [name] is a 35 y.o. female.   Sickle Cell Pain Crisis Location:  Diffuse Severity:  Severe Onset quality:  Gradual Duration:  4 days Similar to previous crisis episodes: yes   Timing:  Constant Progression:  Unchanged Chronicity:  Recurrent Relieved by:  Prescription drugs Worsened by:  Nothing Ineffective treatments:  None tried Associated symptoms: congestion, cough, fatigue, nausea and vomiting   Associated symptoms: no chest pain, no fever, no headaches, no shortness of breath, no swelling of legs and no wheezing   Risk factors: hx of pneumonia       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 04/13/2020   Sickle cell anemia with pain (HCC) 12/08/2019   Decreased appetite 08/28/2019   Chronic pain of right knee 2019/07/24   Gastroesophageal reflux disease without esophagitis 07/24/2019   Anxiety 07/24/19   Death of family member July 24, 2019   Chronic, continuous use of opioids 07-24-2019   Headache, new daily persistent ([facility]) 07/04/2019   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   Chronic pain syndrome 12/27/2018   Anemia of chronic disease 12/27/2018   Pulmonary hypertension (HCC) 04/04/2018    Sickle cell disease (HCC) 04/04/2018   Arthritis 04/04/2018   Chest pain on breathing    Leukocytosis 02/06/2018   Sickle cell crisis (HCC) 02/05/2018   Sickle cell pain crisis (HCC) 10/29/2017   Other social stressor 04/28/2017   Mood disorder (HCC) 03/10/2017   Complex care coordination 06/02/2016   Tobacco abuse 07/07/2012   Contraception 12/08/2010    Past Surgical History:  Procedure Laterality Date   CHOLECYSTECTOMY     DILATION AND CURETTAGE OF UTERUS     GALLBLADDER SURGERY       OB History     Gravida  1   Para      Term      Preterm      AB  1   Living         SAB  1   IAB      Ectopic      Multiple      Live Births              Family History  Adopted: Yes    Social History   Tobacco Use   Smoking status: Every Day    Packs/day: 0.50    Types: Cigarettes   Smokeless tobacco: Never  Vaping Use   Vaping Use: Never used  Substance Use Topics   Alcohol use: No   Drug use: Not Currently    Types: Marijuana    Home Medications Prior to Admission medications   Medication Sig Start Date End Date Taking? Authorizing  Provider  cetirizine (ZYRTEC) 10 MG tablet Take 1 tablet (10 mg total) by mouth daily. Patient taking differently: Take 10 mg by mouth daily as needed for allergies. 04/14/20   [name], FNP  Ensure Plus (ENSURE PLUS) LIQD Take 237 mLs by mouth 3 (three) times daily between meals. Patient taking differently: Take 237 mLs by mouth daily. 08/26/19   [name], FNP  folic acid (FOLVITE) 1 MG tablet Take 1 tablet (1 mg total) by mouth daily. 02/05/21   [name], FNP  gabapentin (NEURONTIN) 300 MG capsule Take 1 capsule (300 mg total) by mouth 3 (three) times daily. 05/05/21   Massie Maroon, FNP  naloxone [facility]) nasal spray 4 mg/0.1 mL 1 spray in nostril, may use every 2-3 minutes in alternating nostrils while awaiting medical assistance 05/05/21   Massie Maroon, FNP  naproxen (NAPROSYN) 500 MG  tablet Take 1 tablet (500 mg total) by mouth 2 (two) times daily with a meal. Patient taking differently: Take 500 mg by mouth 2 (two) times daily as needed for mild pain. 11/27/20   [name], FNP  omeprazole (PRILOSEC) 40 MG capsule Take 1 capsule (40 mg total) by mouth daily. 04/14/20   [name], FNP  oxyCODONE (OXYCONTIN) 15 mg 12 hr tablet Take 1 tablet (15 mg total) by mouth every 12 (twelve) hours. 06/04/21   Massie Maroon, FNP  oxyCODONE (ROXICODONE) 15 MG immediate release tablet Take 1 tablet (15 mg total) by mouth every 4 (four) hours as needed for up to 15 days. 06/18/21 07/03/21  Massie Maroon, FNP  Vitamin D, Ergocalciferol, (DRISDOL) 1.25 MG (50000 UNIT) CAPS capsule Take 1 capsule (50,000 Units total) by mouth every 7 (seven) days. Patient not taking: No sig reported 04/14/20   [name], FNP    Allergies    Kiwi extract, Morphine and related, and Paroxetine  Review of Systems   Review of Systems  Constitutional:  Positive for chills and fatigue. Negative for diaphoresis and fever.  HENT:  Positive for congestion.   Eyes:  Negative for visual disturbance.  Respiratory:  Positive for cough. Negative for chest tightness, shortness of breath and wheezing.   Cardiovascular:  Negative for chest pain.  Gastrointestinal:  Positive for nausea and vomiting. Negative for abdominal pain, constipation and diarrhea.  Genitourinary:  Positive for decreased urine volume (darkened urine). Negative for dysuria, flank pain and frequency.  Musculoskeletal:  Positive for back pain.  Skin:  Negative for rash and wound.  Neurological:  Negative for dizziness and headaches.  Psychiatric/Behavioral:  Negative for agitation and confusion.   All other systems reviewed and are negative.  Physical Exam Updated Vital Signs BP 111/81 (BP Location: Left Arm)   Pulse 80   Temp 98.4 F (36.9 C) (Oral)   Resp 16   Ht  (1.626 m)   Wt 56.7 kg   LMP 05/22/2021 (Exact  Date)   SpO2 94%   BMI 21.46 kg/m   Physical Exam Vitals and nursing note reviewed.  Constitutional:      General: She is not in acute distress.    Appearance: She is well-developed. She is not ill-appearing, toxic-appearing or diaphoretic.  HENT:     Head: Normocephalic and atraumatic.     Nose: Nose normal. No congestion or rhinorrhea.     Mouth/Throat:     Mouth: Mucous membranes are dry.     Pharynx: No oropharyngeal exudate or posterior oropharyngeal erythema.  Eyes:  Extraocular Movements: Extraocular movements intact.     Conjunctiva/sclera: Conjunctivae normal.     Pupils: Pupils are equal, round, and reactive to light.  Cardiovascular:     Rate and Rhythm: Normal rate and regular rhythm.     Heart sounds: No murmur heard. Pulmonary:     Effort: Pulmonary effort is normal. No respiratory distress.     Breath sounds: Normal breath sounds. No wheezing, rhonchi or rales.  Chest:     Chest wall: No tenderness.  Abdominal:     General: Abdomen is flat. There is no distension.     Palpations: Abdomen is soft.     Tenderness: There is no abdominal tenderness. There is no right CVA tenderness, left CVA tenderness or guarding.  Musculoskeletal:        General: Tenderness (diffusely) present.     Cervical back: Neck supple. No tenderness.  Skin:    General: Skin is warm and dry.     Capillary Refill: Capillary refill takes less than 2 seconds.     Findings: No erythema or rash.  Neurological:     General: No focal deficit present.     Mental Status: She is alert.  Psychiatric:        Mood and Affect: Mood normal.    ED Results / Procedures / Treatments   Labs (all labs ordered are listed, but only abnormal results are displayed) Labs Reviewed  RESP PANEL BY RT-PCR (FLU A&B, COVID) ARPGX2 - Abnormal; Notable for the following components:      Result Value   SARS Coronavirus 2 by RT PCR POSITIVE (*)    All other components within normal limits  COMPREHENSIVE  METABOLIC PANEL - Abnormal; Notable for the following components:   Glucose, Bld 106 (*)    Calcium 8.7 (*)    AST 67 (*)    Total Bilirubin 6.4 (*)    All other components within normal limits  CBC WITH DIFFERENTIAL/PLATELET - Abnormal; Notable for the following components:   WBC 12.8 (*)    RBC 2.83 (*)    Hemoglobin 9.1 (*)    HCT 26.1 (*)    RDW 19.9 (*)    nRBC 13.6 (*)    Lymphs Abs 4.7 (*)    Monocytes Absolute 1.1 (*)    Abs Immature Granulocytes 0.19 (*)    All other components within normal limits  RETICULOCYTES - Abnormal; Notable for the following components:   Retic Ct Pct 15.5 (*)    RBC. 2.80 (*)    Retic Count, Absolute 450.0 (*)    All other components within normal limits  URINALYSIS, ROUTINE W REFLEX MICROSCOPIC - Abnormal; Notable for the following components:   Color, Urine AMBER (*)    APPearance HAZY (*)    Hgb urine dipstick LARGE (*)    Protein, ur 100 (*)    Leukocytes,Ua SMALL (*)    Bacteria, UA FEW (*)    All other components within normal limits  URINE CULTURE  I-STAT BETA HCG BLOOD, ED ([facility], [facility], [facility] ONLY)    EKG None  Radiology DG Chest Portable 1 View  Result Date: 06/21/2021 CLINICAL DATA:  Cough, rhinorrhea, sickle pain EXAM: PORTABLE CHEST 1 VIEW COMPARISON:  04/28/2021 FINDINGS: Cardiomegaly. Both lungs are clear. The visualized skeletal structures are unremarkable. IMPRESSION: Cardiomegaly without acute abnormality of the lungs in AP portable projection. Electronically Signed   By: Lauralyn PrimesAlex  Bibbey M.D.   On: 06/21/2021 13:31    Procedures Procedures   CRITICAL CARE  Performed by: Canary Brim Makenley Shimp Total critical care time: 35 minutes Critical care time was exclusive of separately billable procedures and treating other patients. Critical care was necessary to treat or prevent imminent or life-threatening deterioration. Critical care was time spent personally by me on the following activities: development of treatment plan with patient  and/or surrogate as well as nursing, discussions with consultants, evaluation of patient's response to treatment, examination of patient, obtaining history from patient or surrogate, ordering and performing treatments and interventions, ordering and review of laboratory studies, ordering and review of radiographic studies, pulse oximetry and re-evaluation of patient's condition.   Medications Ordered in ED Medications  dextrose 5 %-0.45 % sodium chloride infusion ( Intravenous New Bag/Given 06/21/21 1714)  ondansetron (ZOFRAN-ODT) disintegrating tablet 8 mg (8 mg Oral Given 06/21/21 1526)  ketorolac (TORADOL) 15 MG/ML injection 15 mg (15 mg Intravenous Given 06/21/21 1710)  HYDROmorphone (DILAUDID) injection 2 mg (2 mg Intravenous Given 06/21/21 1741)  HYDROmorphone (DILAUDID) injection 2 mg (2 mg Intravenous Given 06/21/21 1822)  promethazine (PHENERGAN) 25 mg in sodium chloride 0.9 % 50 mL IVPB (0 mg Intravenous Stopped 06/21/21 1732)    ED Course  I have reviewed the triage vital signs and the nursing notes.  Pertinent labs & imaging results that were available during my care of the patient were reviewed by me and considered in my medical decision making (see chart for details).    MDM Rules/Calculators/A&P                           Kara [name] is a 35 y.o. female with a past medical history significant for sickle cell disease, PTSD, pulmonary hypertension, prior pneumonias, and anxiety who presents with congestion, cough, fatigue, malaise, chills, and diffuse pain consistent with sickle cell pain crisis.  Patient reports that she has felt better for the last 4 days and her home medications or not treating the sickle cell pains.  She reportedly tried to call the clinic but was unable to get through and came to the emergency department for pain control.  She reports her pain is severe as a 8 or 9 out of 10 diffusely but denies focal chest pain or abdominal pain.  She denies shortness of breath  with his report has had this cough as well as congestion and URI symptoms.  She reports nausea and vomiting and reports her urine is darker and decreased.  She reports she is not vaccinated against COVID-19 but has not had COVID over the last 2 years.  She denies any other complaints and feels that this does not feel like previous pneumonia.  On exam, lungs are clear and chest is nontender.  Abdomen is nontender.  Bowel sounds appreciated.  Patient has diffuse pain in her extremities and back with some tenderness.  No rashes seen.  Mucous membranes are dry on exam but no evidence of posterior oropharyngeal erythema PTA or RPA.  No focal neurologic deficits.  Patient will get screening labs for sickle cell crisis as well as chest x-ray to look for pneumonia.  We will get urinalysis to look for UTI given her darkened urine and decreased urination.  Will check her for COVID/flu.  We will give her pain medicine, nausea, and fluids.  We will give her Toradol which she reportedly gets.    Anticipate reassessment to determine disposition.  Patient's work-up began to return and she does indeed have COVID-19 infection.  This likely explains her  URI symptoms and what is triggering her sickle cell pain crisis.  Oxygen saturations are between 90% to 99% and she is not short of breath currently.  Had a shared decision made conversation with patient and despite several dose of Dilaudid, she is still feeling ill and does not feel safe going home with COVID, her waxing and waning oxygen saturations, and how she is feeling.  Will call for admission for sickle cell pain crisis brought on by new COVID infection.   Final Clinical Impression(s) / ED Diagnoses Final diagnoses:  Sickle cell pain crisis (HCC)  COVID-19     Clinical Impression: 1. Sickle cell pain crisis (HCC)   2. COVID-19     Disposition: Admit  This note was prepared with assistance of Dragon voice recognition software. Occasional wrong-word or  sound-a-like substitutions may have occurred due to the inherent limitations of voice recognition software.     Anubis Fundora, Canary Brim, MD 06/22/21 [phone]

## 2021-06-22 DIAGNOSIS — F411 Generalized anxiety disorder: Secondary | ICD-10-CM | POA: Diagnosis present

## 2021-06-22 DIAGNOSIS — Z79899 Other long term (current) drug therapy: Secondary | ICD-10-CM | POA: Diagnosis not present

## 2021-06-22 DIAGNOSIS — Z885 Allergy status to narcotic agent status: Secondary | ICD-10-CM | POA: Diagnosis not present

## 2021-06-22 DIAGNOSIS — I959 Hypotension, unspecified: Secondary | ICD-10-CM | POA: Diagnosis present

## 2021-06-22 DIAGNOSIS — F17210 Nicotine dependence, cigarettes, uncomplicated: Secondary | ICD-10-CM | POA: Diagnosis present

## 2021-06-22 DIAGNOSIS — D5700 Hb-SS disease with crisis, unspecified: Secondary | ICD-10-CM | POA: Diagnosis present

## 2021-06-22 DIAGNOSIS — Z79891 Long term (current) use of opiate analgesic: Secondary | ICD-10-CM | POA: Diagnosis not present

## 2021-06-22 DIAGNOSIS — U071 COVID-19: Secondary | ICD-10-CM

## 2021-06-22 DIAGNOSIS — G894 Chronic pain syndrome: Secondary | ICD-10-CM | POA: Diagnosis present

## 2021-06-22 DIAGNOSIS — I272 Pulmonary hypertension, unspecified: Secondary | ICD-10-CM | POA: Diagnosis present

## 2021-06-22 DIAGNOSIS — K219 Gastro-esophageal reflux disease without esophagitis: Secondary | ICD-10-CM | POA: Diagnosis present

## 2021-06-22 DIAGNOSIS — Z91018 Allergy to other foods: Secondary | ICD-10-CM | POA: Diagnosis not present

## 2021-06-22 DIAGNOSIS — D638 Anemia in other chronic diseases classified elsewhere: Secondary | ICD-10-CM | POA: Diagnosis present

## 2021-06-22 DIAGNOSIS — Z28310 Unvaccinated for covid-19: Secondary | ICD-10-CM | POA: Diagnosis not present

## 2021-06-22 DIAGNOSIS — R112 Nausea with vomiting, unspecified: Secondary | ICD-10-CM | POA: Diagnosis present

## 2021-06-22 DIAGNOSIS — R439 Unspecified disturbances of smell and taste: Secondary | ICD-10-CM | POA: Diagnosis present

## 2021-06-22 DIAGNOSIS — G43909 Migraine, unspecified, not intractable, without status migrainosus: Secondary | ICD-10-CM | POA: Diagnosis present

## 2021-06-22 DIAGNOSIS — M1990 Unspecified osteoarthritis, unspecified site: Secondary | ICD-10-CM | POA: Diagnosis present

## 2021-06-22 DIAGNOSIS — Z9049 Acquired absence of other specified parts of digestive tract: Secondary | ICD-10-CM | POA: Diagnosis not present

## 2021-06-22 DIAGNOSIS — Z888 Allergy status to other drugs, medicaments and biological substances status: Secondary | ICD-10-CM | POA: Diagnosis not present

## 2021-06-22 LAB — CBC WITH DIFFERENTIAL/PLATELET
Abs Immature Granulocytes: 0.13 10*3/uL — ABNORMAL HIGH (ref 0.00–0.07)
Basophils Absolute: 0 10*3/uL (ref 0.0–0.1)
Basophils Relative: 0 %
Eosinophils Absolute: 0 10*3/uL (ref 0.0–0.5)
Eosinophils Relative: 0 %
HCT: 19.4 % — ABNORMAL LOW (ref 36.0–46.0)
Hemoglobin: 7 g/dL — ABNORMAL LOW (ref 12.0–15.0)
Immature Granulocytes: 1 %
Lymphocytes Relative: 73 %
Lymphs Abs: 8.6 10*3/uL — ABNORMAL HIGH (ref 0.7–4.0)
MCH: 32.7 pg (ref 26.0–34.0)
MCHC: 36.1 g/dL — ABNORMAL HIGH (ref 30.0–36.0)
MCV: 90.7 fL (ref 80.0–100.0)
Monocytes Absolute: 0.9 10*3/uL (ref 0.1–1.0)
Monocytes Relative: 8 %
Neutro Abs: 2.2 10*3/uL (ref 1.7–7.7)
Neutrophils Relative %: 18 %
Platelets: 268 10*3/uL (ref 150–400)
RBC: 2.14 MIL/uL — ABNORMAL LOW (ref 3.87–5.11)
RDW: 20.4 % — ABNORMAL HIGH (ref 11.5–15.5)
WBC: 11.7 10*3/uL — ABNORMAL HIGH (ref 4.0–10.5)
nRBC: 23.8 % — ABNORMAL HIGH (ref 0.0–0.2)

## 2021-06-22 MED ORDER — GABAPENTIN 300 MG PO CAPS
300.0000 mg | ORAL_CAPSULE | Freq: Three times a day (TID) | ORAL | Status: DC
  Administered 2021-06-22 – 2021-06-28 (×19): 300 mg via ORAL
  Filled 2021-06-22 (×19): qty 1

## 2021-06-22 MED ORDER — PANTOPRAZOLE SODIUM 40 MG IV SOLR
40.0000 mg | Freq: Once | INTRAVENOUS | Status: AC
  Administered 2021-06-22: 40 mg via INTRAVENOUS
  Filled 2021-06-22: qty 40

## 2021-06-22 MED ORDER — HYDROMORPHONE HCL 1 MG/ML IJ SOLN
1.0000 mg | INTRAMUSCULAR | Status: DC | PRN
  Administered 2021-06-22 (×4): 1 mg via INTRAVENOUS
  Filled 2021-06-22 (×4): qty 1

## 2021-06-22 MED ORDER — FOLIC ACID 1 MG PO TABS
1.0000 mg | ORAL_TABLET | Freq: Every day | ORAL | Status: DC
  Administered 2021-06-22 – 2021-06-28 (×7): 1 mg via ORAL
  Filled 2021-06-22 (×7): qty 1

## 2021-06-22 MED ORDER — KETOROLAC TROMETHAMINE 15 MG/ML IJ SOLN
15.0000 mg | Freq: Four times a day (QID) | INTRAMUSCULAR | Status: AC
  Administered 2021-06-22 – 2021-06-23 (×5): 15 mg via INTRAVENOUS
  Filled 2021-06-22 (×5): qty 1

## 2021-06-22 MED ORDER — SODIUM CHLORIDE 0.9 % IV BOLUS
500.0000 mL | Freq: Once | INTRAVENOUS | Status: AC
  Administered 2021-06-22: 500 mL via INTRAVENOUS

## 2021-06-22 MED ORDER — OXYCODONE HCL ER 15 MG PO T12A
15.0000 mg | EXTENDED_RELEASE_TABLET | Freq: Two times a day (BID) | ORAL | Status: DC
  Administered 2021-06-22 – 2021-06-28 (×13): 15 mg via ORAL
  Filled 2021-06-22 (×12): qty 1

## 2021-06-22 MED ORDER — PANTOPRAZOLE SODIUM 40 MG PO TBEC
40.0000 mg | DELAYED_RELEASE_TABLET | Freq: Every day | ORAL | Status: DC
  Administered 2021-06-22 – 2021-06-28 (×7): 40 mg via ORAL
  Filled 2021-06-22 (×7): qty 1

## 2021-06-22 NOTE — H&P (Signed)
History and Physical    Kara [name] ZJQ:[id number] DOB: [birth year] DOA: 06/21/2021  PCP: Debbora Lacrosse, MD  Patient coming from: Home  I have personally briefly reviewed patient's old medical records in [facility]  Chief Complaint: sickle cell pain  HPI: Kara [name] is a 35 y.o. female with medical history significant for sickle cell disease (HbSS), chronic anemia, pulmonary HTN, GERD, anxiety/depression who presents with sickle cell pain.  States she hurts all over her body which is usual for her sickle cell pain crisis.  She was recently discharged from sickle cell clinic on 7/14.  States she has been hurting for since about 5 days ago.  She also developed cough productive of sputum, has sore throat, feels a metallic taste in her mouth and has had nausea and some vomiting.  Has felt night sweats at night.  Denies any dysuria increased urgency or frequency.  No chest pain or shortness of breath.  ED Course: She was afebrile, mildly hypertensive systolic of 90s.  WBC of 12.8.  Hemoglobin of 9 which is her baseline.  BMP unremarkable.  COVID test was positive.  Patient unsure where she could have contracted this.  Chest x-ray is negative.  Review of Systems: Constitutional: No Weight Change, No Fever ENT/Mouth: No sore throat, No Rhinorrhea Eyes: No Eye Pain, No Vision Changes Cardiovascular: No Chest Pain, no SOB Respiratory: + Cough, No Sputum, No Wheezing, no Dyspnea  Gastrointestinal: + Nausea, + Vomiting, No Diarrhea, No Constipation, No Pain Genitourinary: no Urinary Incontinence, No Urgency, No Flank Pain Musculoskeletal: No Arthralgias, No Myalgias Skin: No Skin Lesions, No Pruritus, Neuro: no Weakness, No Numbness Psych: No Anxiety/Panic, No Depression, no decrease appetite Heme/Lymph: No Bruising, No Bleeding   Past Medical History:  Diagnosis Date   Arthritis    Chronic migraine    GERD (gastroesophageal reflux disease)    Hx of  cholecystectomy 2015   Pulmonary hypertension (HCC)    Sickle cell anemia (HCC)    Tendinitis    left elbow    Past Surgical History:  Procedure Laterality Date   CHOLECYSTECTOMY     DILATION AND CURETTAGE OF UTERUS     GALLBLADDER SURGERY       reports that she has been smoking cigarettes. She has been smoking an average of .5 packs per day. She has never used smokeless tobacco. She reports previous drug use. Drug: Marijuana. She reports that she does not drink alcohol. Social History  Allergies  Allergen Reactions   Kiwi Extract Hives   Morphine And Related Hives   Paroxetine Hives    Family History  Adopted: Yes     Prior to Admission medications   Medication Sig Start Date End Date Taking? Authorizing Provider  cetirizine (ZYRTEC) 10 MG tablet Take 1 tablet (10 mg total) by mouth daily. Patient taking differently: Take 10 mg by mouth daily as needed for allergies. 04/14/20  Yes [name], FNP  Ensure Plus (ENSURE PLUS) LIQD Take 237 mLs by mouth 3 (three) times daily between meals. Patient taking differently: Take 237 mLs by mouth daily. 08/26/19  Yes [name], FNP  folic acid (FOLVITE) 1 MG tablet Take 1 tablet (1 mg total) by mouth daily. 02/05/21  Yes [name], FNP  gabapentin (NEURONTIN) 300 MG capsule Take 1 capsule (300 mg total) by mouth 3 (three) times daily. 05/05/21  Yes Massie Maroon, FNP  naproxen (NAPROSYN) 500 MG tablet Take 1 tablet (500 mg total) by  mouth 2 (two) times daily with a meal. Patient taking differently: Take 500 mg by mouth 2 (two) times daily as needed for mild pain. 11/27/20  Yes [name], FNP  omeprazole (PRILOSEC) 40 MG capsule Take 1 capsule (40 mg total) by mouth daily. 04/14/20  Yes [name], FNP  oxyCODONE (OXYCONTIN) 15 mg 12 hr tablet Take 1 tablet (15 mg total) by mouth every 12 (twelve) hours. 06/04/21  Yes Massie Maroon, FNP  oxyCODONE (ROXICODONE) 15 MG immediate release tablet Take 1  tablet (15 mg total) by mouth every 4 (four) hours as needed for up to 15 days. Patient taking differently: Take 15 mg by mouth every 4 (four) hours as needed for pain. 06/18/21 07/03/21 Yes Massie Maroon, FNP  naloxone [facility]) nasal spray 4 mg/0.1 mL 1 spray in nostril, may use every 2-3 minutes in alternating nostrils while awaiting medical assistance Patient taking differently: Place 1 spray into the nose once as needed (overdose). 1 spray in nostril, may use every 2-3 minutes in alternating nostrils while awaiting medical assistance 05/05/21   Massie Maroon, FNP  Vitamin D, Ergocalciferol, (DRISDOL) 1.25 MG (50000 UNIT) CAPS capsule Take 1 capsule (50,000 Units total) by mouth every 7 (seven) days. Patient not taking: No sig reported 04/14/20   [name], FNP    Physical Exam: Vitals:   06/21/21 2200 06/21/21 2230 06/21/21 2300 06/21/21 2330  BP: 103/62 (!) 98/57 (!) 99/57 (!) 100/56  Pulse: 76 73 73 66  Resp: 16 16 16 16   Temp:      TempSrc:      SpO2: 91% 91% 91% 92%  Weight:      Height:        Constitutional: NAD, calm, comfortable, young female sitting up in bed on cell phone Vitals:   06/21/21 2200 06/21/21 2230 06/21/21 2300 06/21/21 2330  BP: 103/62 (!) 98/57 (!) 99/57 (!) 100/56  Pulse: 76 73 73 66  Resp: 16 16 16 16   Temp:      TempSrc:      SpO2: 91% 91% 91% 92%  Weight:      Height:       Eyes: PERRL, lids and conjunctivae normal ENMT: Mucous membranes are moist.  Neck: normal, supple Respiratory: clear to auscultation bilaterally, no wheezing, no crackles. Normal respiratory effort. No accessory muscle use.  Cardiovascular: Regular rate and rhythm, no murmurs / rubs / gallops. No extremity edema.  Abdomen: no tenderness, no masses palpated.  Bowel sounds positive.  Musculoskeletal: no clubbing / cyanosis. No joint deformity upper and lower extremities. Good ROM, no contractures. Normal muscle tone.  Skin: no rashes, lesions, ulcers. No  induration Neurologic: CN 2-12 grossly intact. Sensation intact. Strength 5/5 in all 4.  Psychiatric: Normal judgment and insight. Alert and oriented x 3. Normal mood.     Labs on Admission: I have personally reviewed following labs and imaging studies  CBC: Recent Labs  Lab 06/21/21 1321  WBC 12.8*  NEUTROABS 6.8  HGB 9.1*  HCT 26.1*  MCV 92.2  PLT 306   Basic Metabolic Panel: Recent Labs  Lab 06/21/21 1321  NA 135  K 3.6  CL 99  CO2 29  GLUCOSE 106*  BUN 6  CREATININE 0.50  CALCIUM 8.7*   GFR: Estimated Creatinine Clearance: 85.6 mL/min (by C-G formula based on SCr of 0.5 mg/dL). Liver Function Tests: Recent Labs  Lab 06/21/21 1321  AST 67*  ALT 21  ALKPHOS 51  BILITOT  6.4*  PROT 8.0  ALBUMIN 4.4   No results for input(s): LIPASE, AMYLASE in the last 168 hours. No results for input(s): AMMONIA in the last 168 hours. Coagulation Profile: No results for input(s): INR, PROTIME in the last 168 hours. Cardiac Enzymes: No results for input(s): CKTOTAL, CKMB, CKMBINDEX, TROPONINI in the last 168 hours. BNP (last 3 results) No results for input(s): PROBNP in the last 8760 hours. HbA1C: No results for input(s): HGBA1C in the last 72 hours. CBG: No results for input(s): GLUCAP in the last 168 hours. Lipid Profile: No results for input(s): CHOL, HDL, LDLCALC, TRIG, CHOLHDL, LDLDIRECT in the last 72 hours. Thyroid Function Tests: No results for input(s): TSH, T4TOTAL, FREET4, T3FREE, THYROIDAB in the last 72 hours. Anemia Panel: Recent Labs    06/21/21 1322  RETICCTPCT 15.5*   Urine analysis:    Component Value Date/Time   COLORURINE AMBER (A) 06/21/2021 2000   APPEARANCEUR HAZY (A) 06/21/2021 2000   LABSPEC 1.013 06/21/2021 2000   PHURINE 5.0 06/21/2021 2000   GLUCOSEU NEGATIVE 06/21/2021 2000   HGBUR LARGE (A) 06/21/2021 2000   BILIRUBINUR NEGATIVE 06/21/2021 2000   BILIRUBINUR small 06/11/2020 1217   KETONESUR NEGATIVE 06/21/2021 2000    PROTEINUR 100 (A) 06/21/2021 2000   UROBILINOGEN >=8.0 (A) 06/11/2020 1217   NITRITE NEGATIVE 06/21/2021 2000   LEUKOCYTESUR SMALL (A) 06/21/2021 2000    Radiological Exams on Admission: DG Chest Portable 1 View  Result Date: 06/21/2021 CLINICAL DATA:  Cough, rhinorrhea, sickle pain EXAM: PORTABLE CHEST 1 VIEW COMPARISON:  04/28/2021 FINDINGS: Cardiomegaly. Both lungs are clear. The visualized skeletal structures are unremarkable. IMPRESSION: Cardiomegaly without acute abnormality of the lungs in AP portable projection. Electronically Signed   By: Lauralyn Primes M.D.   On: 06/21/2021 13:31      Assessment/Plan  Sickle cell pain crisis -Likely triggered by COVID-19 viral infection -IV fluids, 0.45% saline at 158ml/hr -Toradol 15 mg IV q6hr -Initiate IV dilaudid PCA. Settings of 0.5 mg, 10 minute lockout, and 3 mg/hr. Will have IV Dilaudid while in the ED since PCA cannot be started here-will need to discontinue once PCA is started.   Hypotention -systolic in the 90s -give 500cc NS bolus   COVID-19 infection -CXR negative. Has URI symtoms as well as nausea and vomiting -pt declined IV remdesivir -Contact and airborne precaution  Chronic anemia - Hemoglobin stable at baseline around 9  DVT prophylaxis:.Lovenox Code Status: Full Family Communication: Plan discussed with patient at bedside  disposition Plan: Home with observation Consults called:  Admission status: Observation  Level of care: Med-Surg  Status is: Observation  The patient remains OBS appropriate and will d/c before 2 midnights.  Dispo: The patient is from: Home              Anticipated d/c is to: Home              Patient currently is not medically stable to d/c.   Difficult to place patient No         Anselm Jungling DO [facility]   If 7PM-7AM, please contact night-coverage [email or web address]   06/22/2021, 12:06 AM

## 2021-06-22 NOTE — TOC Progression Note (Signed)
Transition of Care [facility]) - Progression Note    Patient Details  Name: Kara [name] MRN: [id number] Date of Birth: 03-25-1986  Transition of Care [facility]) CM/SW Contact  Geni Bers, RN Phone Number: 06/22/2021, 2:47 PM  Clinical Narrative:     Pt from home and plan to return home at discharge.   Expected Discharge Plan: Home w Home Health Services Barriers to Discharge: No Barriers Identified  Expected Discharge Plan and Services Expected Discharge Plan: Home w Home Health Services       Living arrangements for the past 2 months: Single Family Home Expected Discharge Date:  (unknown)                                     Social Determinants of Health (SDOH) Interventions    Readmission Risk Interventions No flowsheet data found.

## 2021-06-22 NOTE — Progress Notes (Signed)
Kara [name] is a 35 year old female with a medical history significant for sickle cell disease, chronic pain syndrome, opiate dependence and tolerance, and history of anemia of chronic disease that was admitted earlier this a.m. for COVID-19 infection in the setting of sickle cell pain crisis. Patient was found to be positive for COVID-19.  She reports a 2-day history of intermittent fevers, chills, and fatigue. Patient states that pain is all over and rates it at 8/10.  Patient has been afebrile and does not have an oxygen requirement at this time.  She continues to refuse remdesivir.  Care plan: Continue Dilaudid 1 mg IV every 3 hours as needed while in emergency department.  We will transition to IV Dilaudid PCA as room becomes available. Decrease IV fluids to [facility] Initiate OxyContin 15 mg every 12 hours.   Oxycodone 15 mg every 6 hours as needed for severe breakthrough pain Monitor vital signs very closely, reevaluate pain scale regularly, and supplemental oxygen as needed. Reassess in AM.    Nolon Nations  APRN, MSN, FNP-C Patient Care [facility] [address] [place], [place] [address] [phone]

## 2021-06-22 NOTE — ED Notes (Addendum)
Patient refused cardiac monitoring, RN made aware, will continue to monitor.

## 2021-06-23 DIAGNOSIS — D5700 Hb-SS disease with crisis, unspecified: Secondary | ICD-10-CM | POA: Diagnosis not present

## 2021-06-23 LAB — CBC WITH DIFFERENTIAL/PLATELET
Abs Immature Granulocytes: 0.3 10*3/uL — ABNORMAL HIGH (ref 0.00–0.07)
Basophils Absolute: 0.1 10*3/uL (ref 0.0–0.1)
Basophils Relative: 1 %
Eosinophils Absolute: 0.1 10*3/uL (ref 0.0–0.5)
Eosinophils Relative: 1 %
HCT: 21.3 % — ABNORMAL LOW (ref 36.0–46.0)
Hemoglobin: 7.3 g/dL — ABNORMAL LOW (ref 12.0–15.0)
Immature Granulocytes: 2 %
Lymphocytes Relative: 59 %
Lymphs Abs: 7.7 10*3/uL — ABNORMAL HIGH (ref 0.7–4.0)
MCH: 32.4 pg (ref 26.0–34.0)
MCHC: 34.3 g/dL (ref 30.0–36.0)
MCV: 94.7 fL (ref 80.0–100.0)
Monocytes Absolute: 1 10*3/uL (ref 0.1–1.0)
Monocytes Relative: 8 %
Neutro Abs: 3.8 10*3/uL (ref 1.7–7.7)
Neutrophils Relative %: 29 %
Platelets: 308 10*3/uL (ref 150–400)
RBC: 2.25 MIL/uL — ABNORMAL LOW (ref 3.87–5.11)
RDW: 23.9 % — ABNORMAL HIGH (ref 11.5–15.5)
WBC: 13 10*3/uL — ABNORMAL HIGH (ref 4.0–10.5)
nRBC: 62.5 % — ABNORMAL HIGH (ref 0.0–0.2)

## 2021-06-23 LAB — BASIC METABOLIC PANEL
Anion gap: 10 (ref 5–15)
BUN: <5 mg/dL — ABNORMAL LOW (ref 6–20)
CO2: 25 mmol/L (ref 22–32)
Calcium: 8.5 mg/dL — ABNORMAL LOW (ref 8.9–10.3)
Chloride: 105 mmol/L (ref 98–111)
Creatinine, Ser: 0.4 mg/dL — ABNORMAL LOW (ref 0.44–1.00)
GFR, Estimated: >60 mL/min (ref >60)
Glucose, Bld: 103 mg/dL — ABNORMAL HIGH (ref 70–99)
Potassium: 3.3 mmol/L — ABNORMAL LOW (ref 3.5–5.1)
Sodium: 140 mmol/L (ref 135–145)

## 2021-06-23 LAB — URINE CULTURE

## 2021-06-23 LAB — TYPE AND SCREEN
ABO/RH(D): A POS
Antibody Screen: NEGATIVE

## 2021-06-23 MED ORDER — OXYCODONE HCL 5 MG PO TABS
15.0000 mg | ORAL_TABLET | ORAL | Status: DC | PRN
  Administered 2021-06-23 – 2021-06-28 (×7): 15 mg via ORAL
  Filled 2021-06-23 (×8): qty 3

## 2021-06-23 MED ORDER — PHENOL 1.4 % MT LIQD
1.0000 | OROMUCOSAL | Status: DC | PRN
  Administered 2021-06-27: 1 via OROMUCOSAL
  Filled 2021-06-23: qty 177

## 2021-06-23 MED ORDER — KETOROLAC TROMETHAMINE 15 MG/ML IJ SOLN
15.0000 mg | Freq: Four times a day (QID) | INTRAMUSCULAR | Status: AC
  Administered 2021-06-23 – 2021-06-26 (×15): 15 mg via INTRAVENOUS
  Filled 2021-06-23 (×15): qty 1

## 2021-06-23 NOTE — Progress Notes (Signed)
Subjective: Kara [name] is a 35 year old female with a medical history significant for sickle cell disease, chronic pain syndrome, opiate dependence and tolerance, history of generalized anxiety, and history of anemia of chronic disease was admitted for COVID-19 infection in the setting of sickle cell pain crisis. Patient continues to complain of significant allover body pain that is primarily to upper and lower extremities.  She rates her pain as 8/10.  She appears to be resting comfortably.  She denies any headache, chest pains, shortness of breath, dizziness, urinary symptoms, persistent cough, nausea, vomiting, or diarrhea.  Objective:  Vital signs in last 24 hours:  Vitals:   06/23/21 0039 06/23/21 0045 06/23/21 0451 06/23/21 0527  BP:  103/67  99/67  Pulse:  75  77  Resp: 15 15 14 15   Temp:  98.5 F (36.9 C)  97.7 F (36.5 C)  TempSrc:  Axillary  Oral  SpO2: 92% 92% 93% 95%  Weight:      Height:        Intake/Output from previous day:   Intake/Output Summary (Last 24 hours) at 06/23/2021 1010 Last data filed at 06/22/2021 1500 Gross per 24 hour  Intake 2318.46 ml  Output --  Net 2318.46 ml    Physical Exam: General: Alert, awake, oriented x3, in no acute distress.  HEENT: [place]/AT PEERL, EOMI Neck: Trachea midline,  no masses, no thyromegal,y no JVD, no carotid bruit OROPHARYNX:  Moist, No exudate/ erythema/lesions.  Heart: Regular rate and rhythm, without murmurs, rubs, gallops, PMI non-displaced, no heaves or thrills on palpation.  Lungs: Clear to auscultation, no wheezing or rhonchi noted. No increased vocal fremitus resonant to percussion  Abdomen: Soft, nontender, nondistended, positive bowel sounds, no masses no hepatosplenomegaly noted..  Neuro: No focal neurological deficits noted cranial nerves II through XII grossly intact. DTRs 2+ bilaterally upper and lower extremities. Strength 5 out of 5 in bilateral upper and lower extremities. Musculoskeletal: No warm  swelling or erythema around joints, no spinal tenderness noted. Psychiatric: Patient alert and oriented x3, good insight and cognition, good recent to remote recall. Lymph node survey: No cervical axillary or inguinal lymphadenopathy noted.  Lab Results:  Basic Metabolic Panel:    Component Value Date/Time   NA 140 06/23/2021 0725   NA 138 07/14/2019 1128   K 3.3 (L) 06/23/2021 0725   CL 105 06/23/2021 0725   CO2 25 06/23/2021 0725   BUN <5 (L) 06/23/2021 0725   BUN 7 07/14/2019 1128   CREATININE 0.40 (L) 06/23/2021 0725   GLUCOSE 103 (H) 06/23/2021 0725   CALCIUM 8.5 (L) 06/23/2021 0725   CBC:    Component Value Date/Time   WBC 13.0 (H) 06/23/2021 0725   HGB 7.3 (L) 06/23/2021 0725   HGB 9.1 (L) 07/14/2019 1128   HCT 21.3 (L) 06/23/2021 0725   HCT 27.8 (L) 07/14/2019 1128   PLT 308 06/23/2021 0725   PLT 435 07/14/2019 1128   MCV 94.7 06/23/2021 0725   MCV 103 (H) 07/14/2019 1128   NEUTROABS 3.8 06/23/2021 0725   NEUTROABS 5.0 07/14/2019 1128   LYMPHSABS 7.7 (H) 06/23/2021 0725   LYMPHSABS 7.3 (H) 07/14/2019 1128   MONOABS 1.0 06/23/2021 0725   EOSABS 0.1 06/23/2021 0725   EOSABS 0.3 07/14/2019 1128   BASOSABS 0.1 06/23/2021 0725   BASOSABS 0.1 07/14/2019 1128    Recent Results (from the past 240 hour(s))  Resp Panel by RT-PCR (Flu A&B, Covid) Nasopharyngeal Swab     Status: Abnormal   Collection Time: 06/21/21  1:22 PM   Specimen: Nasopharyngeal Swab; Nasopharyngeal(NP) swabs in vial transport medium  Result Value Ref Range Status   SARS Coronavirus 2 by RT PCR POSITIVE (A) NEGATIVE Final    Comment: CRITICAL RESULT CALLED TO, READ BACK BY AND VERIFIED WITH: JESSICA LOUDERMILK, RN @ [phone] ON 06/21/2021 BY BROOKS, L (NOTE) SARS-CoV-2 target nucleic acids are DETECTED.  The SARS-CoV-2 RNA is generally detectable in upper respiratory specimens during the acute phase of infection. Positive results are indicative of the presence of the identified virus, but do not  rule out bacterial infection or co-infection with other pathogens not detected by the test. Clinical correlation with patient history and other diagnostic information is necessary to determine patient infection status. The expected result is Negative.  Fact Sheet for Patients: BloggerCourse.com  Fact Sheet for Healthcare Providers: SeriousBroker.it  This test is not yet approved or cleared by the [place] FDA and  has been authorized for detection and/or diagnosis of SARS-CoV-2 by FDA under an Emergency Use Authorization (EUA).  This EUA will remain in  effect (meaning this test can be used) for the duration of  the COVID-19 declaration under Section 564(b)(1) of the Act, 21 U.S.C. section 360bbb-3(b)(1), unless the authorization is terminated or revoked sooner.     Influenza A by PCR NEGATIVE NEGATIVE Final   Influenza B by PCR NEGATIVE NEGATIVE Final    Comment: (NOTE) The Xpert Xpress SARS-CoV-2/FLU/RSV plus assay is intended as an aid in the diagnosis of influenza from Nasopharyngeal swab specimens and should not be used as a sole basis for treatment. Nasal washings and aspirates are unacceptable for Xpert Xpress SARS-CoV-2/FLU/RSV testing.  Fact Sheet for Patients: BloggerCourse.com  Fact Sheet for Healthcare Providers: SeriousBroker.it  This test is not yet approved or cleared by the [place] FDA and has been authorized for detection and/or diagnosis of SARS-CoV-2 by FDA under an Emergency Use Authorization (EUA). This EUA will remain in effect (meaning this test can be used) for the duration of the COVID-19 declaration under Section 564(b)(1) of the Act, 21 U.S.C. section 360bbb-3(b)(1), unless the authorization is terminated or revoked.  Performed at [facility], [address], [place], [place] [address]   Urine Culture     Status:  Abnormal   Collection Time: 06/21/21  8:00 PM   Specimen: Urine, Clean Catch  Result Value Ref Range Status   Specimen Description   Final    URINE, CLEAN CATCH Performed at [facility], [address], [place], [place] [address]    Special Requests   Final    NONE Performed at [facility], [address] [place], [place] [address]    Culture MULTIPLE SPECIES PRESENT, SUGGEST RECOLLECTION (A)  Final   Report Status 06/23/2021 FINAL  Final    Studies/Results: DG Chest Portable 1 View  Result Date: 06/21/2021 CLINICAL DATA:  Cough, rhinorrhea, sickle pain EXAM: PORTABLE CHEST 1 VIEW COMPARISON:  04/28/2021 FINDINGS: Cardiomegaly. Both lungs are clear. The visualized skeletal structures are unremarkable. IMPRESSION: Cardiomegaly without acute abnormality of the lungs in AP portable projection. Electronically Signed   By: Lauralyn Primes M.D.   On: 06/21/2021 13:31    Medications: Scheduled Meds:  enoxaparin (LOVENOX) injection  40 mg Subcutaneous Q24H   folic acid  1 mg Oral Daily   gabapentin  300 mg Oral TID   HYDROmorphone   Intravenous Q4H   ketorolac  15 mg Intravenous Q6H   oxyCODONE  15 mg Oral Q12H  pantoprazole  40 mg Oral Daily   senna-docusate  1 tablet Oral BID   Continuous Infusions:  dextrose 5 % and 0.45% NaCl Stopped (06/22/21 1354)   diphenhydrAMINE     PRN Meds:.diphenhydrAMINE **OR** diphenhydrAMINE, naloxone **AND** sodium chloride flush, ondansetron (ZOFRAN) IV, oxyCODONE, phenol, polyethylene glycol  Consultants: None  Procedures: None  Antibiotics: None  Assessment/Plan: Principal Problem:   Sickle cell pain crisis (HCC) Active Problems:   Anemia of chronic disease   COVID-19 virus infection   Hypotension   Sickle cell anemia with crisis (HCC)  COVID-19 infection: Patient positive for COVID-19.  She has been afebrile overnight.  Patient does not have an oxygen requirement at this time.  Patient refused  remdesivir.  Continue symptom management.  Sickle cell disease with pain crisis: Continue IV Dilaudid PCA without changes in settings Oxycodone 15 mg every 4 hours as needed for breakthrough pain Toradol 15 mg every 6 hours OxyContin 15 mg every 12 hours Monitor vital signs very closely, reevaluate pain scale regularly, and supplemental oxygen as needed.  Anemia of chronic disease: Hemoglobin is stable and consistent with patient's baseline.  There is no clinical indication for blood transfusion at this time.  Continue to follow closely.  Chronic pain syndrome: Continue home medication  Code Status: Full Code Family Communication: N/A Disposition Plan: Not yet ready for discharge  Grainger Mccarley [name]  APRN, MSN, FNP-C [facility] [address] [place], [place] [address] [phone]  If 5PM-8AM, please contact night-coverage.  06/23/2021, 10:10 AM  LOS: 1 day

## 2021-06-23 NOTE — Progress Notes (Signed)
Notified medical provider of pt refusal to wear telemetry box for cardiac monitoring since admission. Pt agreed to wear pulse oximetry. In need of tele order to be discontinued.

## 2021-06-24 DIAGNOSIS — D5700 Hb-SS disease with crisis, unspecified: Secondary | ICD-10-CM | POA: Diagnosis not present

## 2021-06-24 LAB — CBC
HCT: 22.9 % — ABNORMAL LOW (ref 36.0–46.0)
Hemoglobin: 7.7 g/dL — ABNORMAL LOW (ref 12.0–15.0)
MCH: 32.4 pg (ref 26.0–34.0)
MCHC: 33.6 g/dL (ref 30.0–36.0)
MCV: 96.2 fL (ref 80.0–100.0)
Platelets: 331 10*3/uL (ref 150–400)
RBC: 2.38 MIL/uL — ABNORMAL LOW (ref 3.87–5.11)
RDW: 26.9 % — ABNORMAL HIGH (ref 11.5–15.5)
WBC: 13.9 10*3/uL — ABNORMAL HIGH (ref 4.0–10.5)
nRBC: 60.7 % — ABNORMAL HIGH (ref 0.0–0.2)

## 2021-06-24 NOTE — Care Management Important Message (Signed)
Important Message  Patient Details IM Letter given to the Patient. Name: Kara [name] MRN: [id number] Date of Birth: 11/01/1986   Medicare Important Message Given:  Yes     Caren Macadam 06/24/2021, 10:39 AM

## 2021-06-24 NOTE — Progress Notes (Signed)
Subjective: Kara [name] is a 35 year old female with a medical history significant for sickle cell disease, chronic pain syndrome, opiate dependence and tolerance, history of generalized anxiety, and history of anemia of chronic disease was admitted for COVID-19 infection in the setting of sickle cell pain crisis. Patient has no new complaints on today.  She continues to complain of allover body pain.  Pain intensity is 6/10.  She denies any headache, fever, chest pain, urinary symptoms, nausea, vomiting, or diarrhea.  Objective:  Vital signs in last 24 hours:  Vitals:   06/24/21 0432 06/24/21 0557 06/24/21 0931 06/24/21 1014  BP:  103/60 102/70   Pulse:  77 63   Resp: 11 15 13 13   Temp:  98.1 F (36.7 C) 98.1 F (36.7 C)   TempSrc:  Oral Oral   SpO2:  94% 94% 92%  Weight:      Height:        Intake/Output from previous day:  No intake or output data in the 24 hours ending 06/24/21 1225  Physical Exam: General: Alert, awake, oriented x3, in no acute distress.  HEENT: [place]/AT PEERL, EOMI Neck: Trachea midline,  no masses, no thyromegal,y no JVD, no carotid bruit OROPHARYNX:  Moist, No exudate/ erythema/lesions.  Heart: Regular rate and rhythm, without murmurs, rubs, gallops, PMI non-displaced, no heaves or thrills on palpation.  Lungs: Clear to auscultation, no wheezing or rhonchi noted. No increased vocal fremitus resonant to percussion  Abdomen: Soft, nontender, nondistended, positive bowel sounds, no masses no hepatosplenomegaly noted..  Neuro: No focal neurological deficits noted cranial nerves II through XII grossly intact. DTRs 2+ bilaterally upper and lower extremities. Strength 5 out of 5 in bilateral upper and lower extremities. Musculoskeletal: No warm swelling or erythema around joints, no spinal tenderness noted. Psychiatric: Patient alert and oriented x3, good insight and cognition, good recent to remote recall. Lymph node survey: No cervical axillary or inguinal  lymphadenopathy noted.  Lab Results:  Basic Metabolic Panel:    Component Value Date/Time   NA 140 06/23/2021 0725   NA 138 07/14/2019 1128   K 3.3 (L) 06/23/2021 0725   CL 105 06/23/2021 0725   CO2 25 06/23/2021 0725   BUN <5 (L) 06/23/2021 0725   BUN 7 07/14/2019 1128   CREATININE 0.40 (L) 06/23/2021 0725   GLUCOSE 103 (H) 06/23/2021 0725   CALCIUM 8.5 (L) 06/23/2021 0725   CBC:    Component Value Date/Time   WBC 13.9 (H) 06/24/2021 0353   HGB 7.7 (L) 06/24/2021 0353   HGB 9.1 (L) 07/14/2019 1128   HCT 22.9 (L) 06/24/2021 0353   HCT 27.8 (L) 07/14/2019 1128   PLT 331 06/24/2021 0353   PLT 435 07/14/2019 1128   MCV 96.2 06/24/2021 0353   MCV 103 (H) 07/14/2019 1128   NEUTROABS 3.8 06/23/2021 0725   NEUTROABS 5.0 07/14/2019 1128   LYMPHSABS 7.7 (H) 06/23/2021 0725   LYMPHSABS 7.3 (H) 07/14/2019 1128   MONOABS 1.0 06/23/2021 0725   EOSABS 0.1 06/23/2021 0725   EOSABS 0.3 07/14/2019 1128   BASOSABS 0.1 06/23/2021 0725   BASOSABS 0.1 07/14/2019 1128    Recent Results (from the past 240 hour(s))  Resp Panel by RT-PCR (Flu A&B, Covid) Nasopharyngeal Swab     Status: Abnormal   Collection Time: 06/21/21  1:22 PM   Specimen: Nasopharyngeal Swab; Nasopharyngeal(NP) swabs in vial transport medium  Result Value Ref Range Status   SARS Coronavirus 2 by RT PCR POSITIVE (A) NEGATIVE Final  Comment: CRITICAL RESULT CALLED TO, READ BACK BY AND VERIFIED WITH: JESSICA LOUDERMILK, RN @ [phone] ON 06/21/2021 BY BROOKS, L (NOTE) SARS-CoV-2 target nucleic acids are DETECTED.  The SARS-CoV-2 RNA is generally detectable in upper respiratory specimens during the acute phase of infection. Positive results are indicative of the presence of the identified virus, but do not rule out bacterial infection or co-infection with other pathogens not detected by the test. Clinical correlation with patient history and other diagnostic information is necessary to determine patient infection  status. The expected result is Negative.  Fact Sheet for Patients: BloggerCourse.com  Fact Sheet for Healthcare Providers: SeriousBroker.it  This test is not yet approved or cleared by the [place] FDA and  has been authorized for detection and/or diagnosis of SARS-CoV-2 by FDA under an Emergency Use Authorization (EUA).  This EUA will remain in  effect (meaning this test can be used) for the duration of  the COVID-19 declaration under Section 564(b)(1) of the Act, 21 U.S.C. section 360bbb-3(b)(1), unless the authorization is terminated or revoked sooner.     Influenza A by PCR NEGATIVE NEGATIVE Final   Influenza B by PCR NEGATIVE NEGATIVE Final    Comment: (NOTE) The Xpert Xpress SARS-CoV-2/FLU/RSV plus assay is intended as an aid in the diagnosis of influenza from Nasopharyngeal swab specimens and should not be used as a sole basis for treatment. Nasal washings and aspirates are unacceptable for Xpert Xpress SARS-CoV-2/FLU/RSV testing.  Fact Sheet for Patients: BloggerCourse.com  Fact Sheet for Healthcare Providers: SeriousBroker.it  This test is not yet approved or cleared by the [place] FDA and has been authorized for detection and/or diagnosis of SARS-CoV-2 by FDA under an Emergency Use Authorization (EUA). This EUA will remain in effect (meaning this test can be used) for the duration of the COVID-19 declaration under Section 564(b)(1) of the Act, 21 U.S.C. section 360bbb-3(b)(1), unless the authorization is terminated or revoked.  Performed at [facility], [address], [place], [place] [address]   Urine Culture     Status: Abnormal   Collection Time: 06/21/21  8:00 PM   Specimen: Urine, Clean Catch  Result Value Ref Range Status   Specimen Description   Final    URINE, CLEAN CATCH Performed at [facility], [address], [place], [place] [address]    Special Requests   Final    NONE Performed at [facility], [address], [place], [place] [address]    Culture MULTIPLE SPECIES PRESENT, SUGGEST RECOLLECTION (A)  Final   Report Status 06/23/2021 FINAL  Final    Studies/Results: No results found.  Medications: Scheduled Meds:  enoxaparin (LOVENOX) injection  40 mg Subcutaneous Q24H   folic acid  1 mg Oral Daily   gabapentin  300 mg Oral TID   HYDROmorphone   Intravenous Q4H   ketorolac  15 mg Intravenous Q6H   oxyCODONE  15 mg Oral Q12H   pantoprazole  40 mg Oral Daily   senna-docusate  1 tablet Oral BID   Continuous Infusions:  dextrose 5 % and 0.45% NaCl Stopped (06/22/21 1354)   diphenhydrAMINE     PRN Meds:.diphenhydrAMINE **OR** diphenhydrAMINE, naloxone **AND** sodium chloride flush, ondansetron (ZOFRAN) IV, oxyCODONE, phenol, polyethylene glycol  Consultants: None  Procedures: None  Antibiotics: None  Assessment/Plan: Principal Problem:   Sickle cell pain crisis (HCC) Active Problems:   Anemia of chronic disease   COVID-19 virus infection   Hypotension   Sickle cell anemia with  crisis (HCC)  COVID-19 infection: Patient positive for COVID-19.  She continues to be afebrile without an oxygen requirement.  Patient refused remdesivir.  Continue symptom management as needed.  Sickle cell disease with pain crisis: Continue IV Dilaudid PCA, no changes in settings today Oxycodone 15 mg every 4 hours as needed for severe breakthrough pain Toradol 15 mg every 6 hours for total of 5 days OxyContin 15 mg every 12 hours Monitor vital signs very closely, reevaluate pain scale regularly, and supplemental oxygen as needed  Anemia of chronic disease: Hemoglobin is stable and consistent with patient's baseline.  There is no clinical indication for blood transfusion at this time.  Continue to follow closely.  Chronic pain syndrome: Continue home  medication   Code Status: Full Code Family Communication: N/A Disposition Plan: Not yet ready for discharge  Abbie Berling [name]  APRN, MSN, FNP-C [facility] [address] [place], [place] [address] [phone]  If 7PM-7AM, please contact night-coverage.  06/24/2021, 12:25 PM  LOS: 2 days

## 2021-06-25 DIAGNOSIS — I959 Hypotension, unspecified: Secondary | ICD-10-CM | POA: Diagnosis not present

## 2021-06-25 DIAGNOSIS — D638 Anemia in other chronic diseases classified elsewhere: Secondary | ICD-10-CM | POA: Diagnosis not present

## 2021-06-25 DIAGNOSIS — U071 COVID-19: Secondary | ICD-10-CM

## 2021-06-25 DIAGNOSIS — D5700 Hb-SS disease with crisis, unspecified: Secondary | ICD-10-CM | POA: Diagnosis not present

## 2021-06-25 LAB — CBC
HCT: 21.4 % — ABNORMAL LOW (ref 36.0–46.0)
Hemoglobin: 7.1 g/dL — ABNORMAL LOW (ref 12.0–15.0)
MCH: 32.6 pg (ref 26.0–34.0)
MCHC: 33.2 g/dL (ref 30.0–36.0)
MCV: 98.2 fL (ref 80.0–100.0)
Platelets: 304 10*3/uL (ref 150–400)
RBC: 2.18 MIL/uL — ABNORMAL LOW (ref 3.87–5.11)
RDW: 26.5 % — ABNORMAL HIGH (ref 11.5–15.5)
WBC: 10.7 10*3/uL — ABNORMAL HIGH (ref 4.0–10.5)
nRBC: 53.4 % — ABNORMAL HIGH (ref 0.0–0.2)

## 2021-06-25 MED ORDER — GUAIFENESIN-DM 100-10 MG/5ML PO SYRP
5.0000 mL | ORAL_SOLUTION | ORAL | Status: DC | PRN
  Administered 2021-06-25: 5 mL via ORAL
  Filled 2021-06-25: qty 10

## 2021-06-25 NOTE — Progress Notes (Signed)
Patient ID: Kara [name], female   DOB: Mar 06, 1986, 35 y.o.   MRN: [id number] Subjective: Kara [name] is a 35 year old female with a medical history significant for sickle cell disease, chronic pain syndrome, opiate dependence and tolerance, history of generalized anxiety, and history of anemia of chronic disease was admitted for COVID-19 infection in the setting of sickle cell pain crisis.  Patient has no new complaint today.  She continues to say her pain is not better, she is still coughing although much improved with Robitussin.  She rates her pain as 7/10 this morning.  She denies any fever, headache, chest pain, nausea, vomiting or diarrhea.  No urinary symptoms.  Objective:  Vital signs in last 24 hours:  Vitals:   06/25/21 0643 06/25/21 0735 06/25/21 1017 06/25/21 1128  BP: 134/62  116/72   Pulse: 68  69   Resp: 16 12 16 12   Temp: 98 F (36.7 C)  98.2 F (36.8 C)   TempSrc: Oral  Oral   SpO2: 96% 96% 95% 97%  Weight:      Height:        Intake/Output from previous day:   Intake/Output Summary (Last 24 hours) at 06/25/2021 1235 Last data filed at 06/25/2021 0532 Gross per 24 hour  Intake 1342.34 ml  Output --  Net 1342.34 ml    Physical Exam: General: Alert, awake, oriented x3, in no acute distress.  HEENT: [place]/AT PEERL, EOMI Neck: Trachea midline,  no masses, no thyromegal,y no JVD, no carotid bruit OROPHARYNX:  Moist, No exudate/ erythema/lesions.  Heart: Regular rate and rhythm, without murmurs, rubs, gallops, PMI non-displaced, no heaves or thrills on palpation.  Lungs: Clear to auscultation, no wheezing or rhonchi noted. No increased vocal fremitus resonant to percussion  Abdomen: Soft, nontender, nondistended, positive bowel sounds, no masses no hepatosplenomegaly noted..  Neuro: No focal neurological deficits noted cranial nerves II through XII grossly intact. DTRs 2+ bilaterally upper and lower extremities. Strength 5 out of 5 in bilateral upper and lower  extremities. Musculoskeletal: No warm swelling or erythema around joints, no spinal tenderness noted. Psychiatric: Patient alert and oriented x3, good insight and cognition, good recent to remote recall. Lymph node survey: No cervical axillary or inguinal lymphadenopathy noted.  Lab Results:  Basic Metabolic Panel:    Component Value Date/Time   NA 140 06/23/2021 0725   NA 138 07/14/2019 1128   K 3.3 (L) 06/23/2021 0725   CL 105 06/23/2021 0725   CO2 25 06/23/2021 0725   BUN <5 (L) 06/23/2021 0725   BUN 7 07/14/2019 1128   CREATININE 0.40 (L) 06/23/2021 0725   GLUCOSE 103 (H) 06/23/2021 0725   CALCIUM 8.5 (L) 06/23/2021 0725   CBC:    Component Value Date/Time   WBC 10.7 (H) 06/25/2021 0452   HGB 7.1 (L) 06/25/2021 0452   HGB 9.1 (L) 07/14/2019 1128   HCT 21.4 (L) 06/25/2021 0452   HCT 27.8 (L) 07/14/2019 1128   PLT 304 06/25/2021 0452   PLT 435 07/14/2019 1128   MCV 98.2 06/25/2021 0452   MCV 103 (H) 07/14/2019 1128   NEUTROABS 3.8 06/23/2021 0725   NEUTROABS 5.0 07/14/2019 1128   LYMPHSABS 7.7 (H) 06/23/2021 0725   LYMPHSABS 7.3 (H) 07/14/2019 1128   MONOABS 1.0 06/23/2021 0725   EOSABS 0.1 06/23/2021 0725   EOSABS 0.3 07/14/2019 1128   BASOSABS 0.1 06/23/2021 0725   BASOSABS 0.1 07/14/2019 1128    Recent Results (from the past 240 hour(s))  Resp Panel by  RT-PCR (Flu A&B, Covid) Nasopharyngeal Swab     Status: Abnormal   Collection Time: 06/21/21  1:22 PM   Specimen: Nasopharyngeal Swab; Nasopharyngeal(NP) swabs in vial transport medium  Result Value Ref Range Status   SARS Coronavirus 2 by RT PCR POSITIVE (A) NEGATIVE Final    Comment: CRITICAL RESULT CALLED TO, READ BACK BY AND VERIFIED WITH: JESSICA LOUDERMILK, RN @ [phone] ON 06/21/2021 BY BROOKS, L (NOTE) SARS-CoV-2 target nucleic acids are DETECTED.  The SARS-CoV-2 RNA is generally detectable in upper respiratory specimens during the acute phase of infection. Positive results are indicative of the  presence of the identified virus, but do not rule out bacterial infection or co-infection with other pathogens not detected by the test. Clinical correlation with patient history and other diagnostic information is necessary to determine patient infection status. The expected result is Negative.  Fact Sheet for Patients: BloggerCourse.com  Fact Sheet for Healthcare Providers: SeriousBroker.it  This test is not yet approved or cleared by the [place] FDA and  has been authorized for detection and/or diagnosis of SARS-CoV-2 by FDA under an Emergency Use Authorization (EUA).  This EUA will remain in  effect (meaning this test can be used) for the duration of  the COVID-19 declaration under Section 564(b)(1) of the Act, 21 U.S.C. section 360bbb-3(b)(1), unless the authorization is terminated or revoked sooner.     Influenza A by PCR NEGATIVE NEGATIVE Final   Influenza B by PCR NEGATIVE NEGATIVE Final    Comment: (NOTE) The Xpert Xpress SARS-CoV-2/FLU/RSV plus assay is intended as an aid in the diagnosis of influenza from Nasopharyngeal swab specimens and should not be used as a sole basis for treatment. Nasal washings and aspirates are unacceptable for Xpert Xpress SARS-CoV-2/FLU/RSV testing.  Fact Sheet for Patients: BloggerCourse.com  Fact Sheet for Healthcare Providers: SeriousBroker.it  This test is not yet approved or cleared by the [place] FDA and has been authorized for detection and/or diagnosis of SARS-CoV-2 by FDA under an Emergency Use Authorization (EUA). This EUA will remain in effect (meaning this test can be used) for the duration of the COVID-19 declaration under Section 564(b)(1) of the Act, 21 U.S.C. section 360bbb-3(b)(1), unless the authorization is terminated or revoked.  Performed at [facility], [address] [place], [place] [address]   Urine Culture     Status: Abnormal   Collection Time: 06/21/21  8:00 PM   Specimen: Urine, Clean Catch  Result Value Ref Range Status   Specimen Description   Final    URINE, CLEAN CATCH Performed at [facility], [address], [place], [place] [address]    Special Requests   Final    NONE Performed at [facility], [address], [place], [place] [address]    Culture MULTIPLE SPECIES PRESENT, SUGGEST RECOLLECTION (A)  Final   Report Status 06/23/2021 FINAL  Final    Studies/Results: No results found.  Medications: Scheduled Meds:  enoxaparin (LOVENOX) injection  40 mg Subcutaneous Q24H   folic acid  1 mg Oral Daily   gabapentin  300 mg Oral TID   HYDROmorphone   Intravenous Q4H   ketorolac  15 mg Intravenous Q6H   oxyCODONE  15 mg Oral Q12H   pantoprazole  40 mg Oral Daily   senna-docusate  1 tablet Oral BID   Continuous Infusions:  dextrose 5 % and 0.45% NaCl 10 mL/hr at 06/25/21 0316   diphenhydrAMINE     PRN Meds:.diphenhydrAMINE **OR** diphenhydrAMINE,  guaiFENesin-dextromethorphan, naloxone **AND** sodium chloride flush, ondansetron (ZOFRAN) IV, oxyCODONE, phenol, polyethylene glycol  Consultants: None  Procedures: None  Antibiotics: None.  Refused remdesivir.  Assessment/Plan: Principal Problem:   Sickle cell pain crisis (HCC) Active Problems:   Anemia of chronic disease   COVID-19 virus infection   Hypotension   Sickle cell anemia with crisis (HCC)  COVID-19 infection: Patient remains afebrile, no increased oxygen requirement.  Patient refused remdesivir.  At this time, we will continue symptomatic management.  Patient encouraged to use incentive spirometry and to sit out of bed as often as tolerable. Hb Sickle Cell Disease with crisis: Continue IVF at [facility].  Continue weight based Dilaudid PCA at the current setting, continue IV Toradol 15 mg Q 6 H for total of 5 days, continue oral  home pain medications as ordered. Monitor vitals very closely, Re-evaluate pain scale regularly, 2 L of Oxygen by [place]. Sickle Cell Anemia: Hemoglobin is stable at baseline today.  There is no clinical indication for blood transfusion at this time.  We will repeat labs in a.m. and continue to monitor closely. Chronic pain Syndrome: Continue home medications.  Code Status: Full Code Family Communication: N/A Disposition Plan: Not yet ready for discharge  Noemi Bellissimo  If 7PM-7AM, please contact night-coverage.  06/25/2021, 12:35 PM  LOS: 3 days

## 2021-06-26 DIAGNOSIS — D638 Anemia in other chronic diseases classified elsewhere: Secondary | ICD-10-CM | POA: Diagnosis not present

## 2021-06-26 DIAGNOSIS — D5700 Hb-SS disease with crisis, unspecified: Secondary | ICD-10-CM | POA: Diagnosis not present

## 2021-06-26 DIAGNOSIS — I959 Hypotension, unspecified: Secondary | ICD-10-CM | POA: Diagnosis not present

## 2021-06-26 DIAGNOSIS — U071 COVID-19: Secondary | ICD-10-CM | POA: Diagnosis not present

## 2021-06-26 NOTE — Progress Notes (Signed)
Patient ID: Kara [name], female   DOB: October 14, 1986, 35 y.o.   MRN: [id number] Subjective: Kara [name] is a 35 year old female with a medical history significant for sickle cell disease, chronic pain syndrome, opiate dependence and tolerance, history of generalized anxiety, and history of anemia of chronic disease was admitted for COVID-19 infection in the setting of sickle cell pain crisis.  When asked if she is much improved today, she answered "yes and no".  She said her pain remained the same as yesterday.  There is no new development.  She denies any fever, headache, chest pain, nausea, vomiting or diarrhea.  Cough is improved.  Objective:  Vital signs in last 24 hours:  Vitals:   06/26/21 0529 06/26/21 0534 06/26/21 0802 06/26/21 1226  BP: 112/61     Pulse: 66     Resp: 16 17 11 12   Temp: 98.2 F (36.8 C)     TempSrc: Oral     SpO2: (!) 86% 94% 95% 96%  Weight:      Height:        Intake/Output from previous day:   Intake/Output Summary (Last 24 hours) at 06/26/2021 1326 Last data filed at 06/25/2021 1707 Gross per 24 hour  Intake 113.75 ml  Output --  Net 113.75 ml     Physical Exam: General: Alert, awake, oriented x3, in no acute distress.  HEENT: [place]/AT [place]ERL, EOMI Neck: Trachea midline,  no masses, no thyromegal,y no JVD, no carotid bruit OROPHARYNX:  Moist, No exudate/ erythema/lesions.  Heart: Regular rate and rhythm, without murmurs, rubs, gallops, PMI non-displaced, no heaves or thrills on palpation.  Lungs: Clear to auscultation, no wheezing or rhonchi noted. No increased vocal fremitus resonant to percussion  Abdomen: Soft, nontender, nondistended, positive bowel sounds, no masses no hepatosplenomegaly noted..  Neuro: No focal neurological deficits noted cranial nerves II through XII grossly intact. DTRs 2+ bilaterally upper and lower extremities. Strength 5 out of 5 in bilateral upper and lower extremities. Musculoskeletal: No warm swelling or erythema  around joints, no spinal tenderness noted. Psychiatric: Patient alert and oriented x3, good insight and cognition, good recent to remote recall. Lymph node survey: No cervical axillary or inguinal lymphadenopathy noted.  Lab Results:  Basic Metabolic Panel:    Component Value Date/Time   NA 140 06/23/2021 0725   NA 138 07/14/2019 1128   K 3.3 (L) 06/23/2021 0725   CL 105 06/23/2021 0725   CO2 25 06/23/2021 0725   BUN <5 (L) 06/23/2021 0725   BUN 7 07/14/2019 1128   CREATININE 0.40 (L) 06/23/2021 0725   GLUCOSE 103 (H) 06/23/2021 0725   CALCIUM 8.5 (L) 06/23/2021 0725   CBC:    Component Value Date/Time   WBC 10.7 (H) 06/25/2021 0452   HGB 7.1 (L) 06/25/2021 0452   HGB 9.1 (L) 07/14/2019 1128   HCT 21.4 (L) 06/25/2021 0452   HCT 27.8 (L) 07/14/2019 1128   PLT 304 06/25/2021 0452   PLT 435 07/14/2019 1128   MCV 98.2 06/25/2021 0452   MCV 103 (H) 07/14/2019 1128   NEUTROABS 3.8 06/23/2021 0725   NEUTROABS 5.0 07/14/2019 1128   LYMPHSABS 7.7 (H) 06/23/2021 0725   LYMPHSABS 7.3 (H) 07/14/2019 1128   MONOABS 1.0 06/23/2021 0725   EOSABS 0.1 06/23/2021 0725   EOSABS 0.3 07/14/2019 1128   BASOSABS 0.1 06/23/2021 0725   BASOSABS 0.1 07/14/2019 1128    Recent Results (from the past 240 hour(s))  Resp Panel by RT-PCR (Flu A&B, Covid) Nasopharyngeal Swab  Status: Abnormal   Collection Time: 06/21/21  1:22 PM   Specimen: Nasopharyngeal Swab; Nasopharyngeal(NP) swabs in vial transport medium  Result Value Ref Range Status   SARS Coronavirus 2 by RT PCR POSITIVE (A) NEGATIVE Final    Comment: CRITICAL RESULT CALLED TO, READ BACK BY AND VERIFIED WITH: JESSICA LOUDERMILK, RN @ [phone] ON 06/21/2021 BY BROOKS, L (NOTE) SARS-CoV-2 target nucleic acids are DETECTED.  The SARS-CoV-2 RNA is generally detectable in upper respiratory specimens during the acute phase of infection. Positive results are indicative of the presence of the identified virus, but do not rule out bacterial  infection or co-infection with other pathogens not detected by the test. Clinical correlation with patient history and other diagnostic information is necessary to determine patient infection status. The expected result is Negative.  Fact Sheet for Patients: BloggerCourse.com  Fact Sheet for Healthcare Providers: SeriousBroker.it  This test is not yet approved or cleared by the [place] FDA and  has been authorized for detection and/or diagnosis of SARS-CoV-2 by FDA under an Emergency Use Authorization (EUA).  This EUA will remain in  effect (meaning this test can be used) for the duration of  the COVID-19 declaration under Section 564(b)(1) of the Act, 21 U.S.C. section 360bbb-3(b)(1), unless the authorization is terminated or revoked sooner.     Influenza A by PCR NEGATIVE NEGATIVE Final   Influenza B by PCR NEGATIVE NEGATIVE Final    Comment: (NOTE) The Xpert Xpress SARS-CoV-2/FLU/RSV plus assay is intended as an aid in the diagnosis of influenza from Nasopharyngeal swab specimens and should not be used as a sole basis for treatment. Nasal washings and aspirates are unacceptable for Xpert Xpress SARS-CoV-2/FLU/RSV testing.  Fact Sheet for Patients: BloggerCourse.com  Fact Sheet for Healthcare Providers: SeriousBroker.it  This test is not yet approved or cleared by the [place] FDA and has been authorized for detection and/or diagnosis of SARS-CoV-2 by FDA under an Emergency Use Authorization (EUA). This EUA will remain in effect (meaning this test can be used) for the duration of the COVID-19 declaration under Section 564(b)(1) of the Act, 21 U.S.C. section 360bbb-3(b)(1), unless the authorization is terminated or revoked.  Performed at [facility], [address], [place], [place] [address]   Urine Culture     Status: Abnormal   Collection  Time: 06/21/21  8:00 PM   Specimen: Urine, Clean Catch  Result Value Ref Range Status   Specimen Description   Final    URINE, CLEAN CATCH Performed at [facility], [address] [place], [place] [address]    Special Requests   Final    NONE Performed at [facility], [address] [place], [place] [address]    Culture MULTIPLE SPECIES PRESENT, SUGGEST RECOLLECTION (A)  Final   Report Status 06/23/2021 FINAL  Final    Studies/Results: No results found.  Medications: Scheduled Meds:  enoxaparin (LOVENOX) injection  40 mg Subcutaneous Q24H   folic acid  1 mg Oral Daily   gabapentin  300 mg Oral TID   HYDROmorphone   Intravenous Q4H   ketorolac  15 mg Intravenous Q6H   oxyCODONE  15 mg Oral Q12H   pantoprazole  40 mg Oral Daily   senna-docusate  1 tablet Oral BID   Continuous Infusions:  dextrose 5 % and 0.45% NaCl 10 mL/hr at 06/25/21 0316   diphenhydrAMINE     PRN Meds:.diphenhydrAMINE **OR** diphenhydrAMINE, guaiFENesin-dextromethorphan, naloxone **AND** sodium chloride flush, ondansetron (ZOFRAN) IV, oxyCODONE,  phenol, polyethylene glycol  Consultants: None  Procedures: None  Antibiotics: None.  Refused remdesivir.  Assessment/Plan: Principal Problem:   Sickle cell pain crisis (HCC) Active Problems:   Anemia of chronic disease   COVID-19   Hypotension   Sickle cell anemia with crisis (HCC)  COVID-19 infection: Patient remains afebrile, no increased oxygen requirement. Patient refused remdesivir. We will continue symptomatic management. Patient encouraged to use incentive spirometry and to sit out of bed as often as tolerable. Hb Sickle Cell Disease with crisis: Continue IVF at [facility].  We will begin to wean weight based Dilaudid PCA sometimes today, continue IV Toradol 15 mg Q 6 H for total of 5 days, continue oral home pain medications as ordered. Monitor vitals very closely, Re-evaluate pain scale regularly, 2 L of  Oxygen by [place]. Sickle Cell Anemia: Hemoglobin is stable at baseline today.  There is no clinical indication for blood transfusion at this time.  We will repeat labs in a.m. and continue to monitor closely. Chronic pain Syndrome: Continue home medications.  Code Status: Full Code Family Communication: N/A Disposition Plan: Not yet ready for discharge  Aarit Kashuba  If 7PM-7AM, please contact night-coverage.  06/26/2021, 1:26 PM  LOS: 4 days

## 2021-06-27 DIAGNOSIS — I959 Hypotension, unspecified: Secondary | ICD-10-CM | POA: Diagnosis not present

## 2021-06-27 DIAGNOSIS — U071 COVID-19: Secondary | ICD-10-CM | POA: Diagnosis not present

## 2021-06-27 DIAGNOSIS — D638 Anemia in other chronic diseases classified elsewhere: Secondary | ICD-10-CM | POA: Diagnosis not present

## 2021-06-27 DIAGNOSIS — D5700 Hb-SS disease with crisis, unspecified: Secondary | ICD-10-CM | POA: Diagnosis not present

## 2021-06-27 LAB — BASIC METABOLIC PANEL
Anion gap: 8 (ref 5–15)
BUN: <5 mg/dL — ABNORMAL LOW (ref 6–20)
CO2: 29 mmol/L (ref 22–32)
Calcium: 8.9 mg/dL (ref 8.9–10.3)
Chloride: 105 mmol/L (ref 98–111)
Creatinine, Ser: 0.47 mg/dL (ref 0.44–1.00)
GFR, Estimated: >60 mL/min (ref >60)
Glucose, Bld: 104 mg/dL — ABNORMAL HIGH (ref 70–99)
Potassium: 4 mmol/L (ref 3.5–5.1)
Sodium: 142 mmol/L (ref 135–145)

## 2021-06-27 LAB — CBC WITH DIFFERENTIAL/PLATELET
Band Neutrophils: 0 %
Basophils Relative: 1 %
Blasts: NONE SEEN %
Eosinophils Relative: 1 %
HCT: 21.7 % — ABNORMAL LOW (ref 36.0–46.0)
Hemoglobin: 7.3 g/dL — ABNORMAL LOW (ref 12.0–15.0)
Lymphocytes Relative: 60 %
MCH: 32.7 pg (ref 26.0–34.0)
MCHC: 33.6 g/dL (ref 30.0–36.0)
MCV: 97.3 fL (ref 80.0–100.0)
Metamyelocytes Relative: NONE SEEN %
Monocytes Relative: 6 %
Myelocytes: NONE SEEN %
Neutrophils Relative %: 32 %
Platelets: 285 10*3/uL (ref 150–400)
Promyelocytes Relative: NONE SEEN %
RBC: 2.23 MIL/uL — ABNORMAL LOW (ref 3.87–5.11)
RDW: 22.5 % — ABNORMAL HIGH (ref 11.5–15.5)
WBC Morphology: NORMAL
WBC: 5 10*3/uL (ref 4.0–10.5)
nRBC: 90 /100 WBC — ABNORMAL HIGH

## 2021-06-27 NOTE — Progress Notes (Signed)
Patient ID: Kara [name], female   DOB: 1986-02-28, 35 y.o.   MRN: [id number] Subjective: Kara [name] is a 35 year old female with a medical history significant for sickle cell disease, chronic pain syndrome, opiate dependence and tolerance, history of generalized anxiety, and history of anemia of chronic disease was admitted for COVID-19 infection in the setting of sickle cell pain crisis.  Patient's general condition remains the same today.  She has no new complaint.  She said her pain is still significant, about 7/10 in intensity.  She also describes some puffiness in her lower extremities.  She denies any fever, headache, chest pain, shortness of breath, nausea, vomiting or diarrhea.   Objective:  Vital signs in last 24 hours:  Vitals:   06/27/21 0038 06/27/21 0414 06/27/21 0530 06/27/21 0917  BP:   119/68   Pulse:   70   Resp: 14 11 16 16   Temp:   98.2 F (36.8 C)   TempSrc:   Oral   SpO2: 95% 93% 95% 97%  Weight:      Height:        Intake/Output from previous day:  No intake or output data in the 24 hours ending 06/27/21 1122   Physical Exam: General: Alert, awake, oriented x3, in no acute distress.  HEENT: [place]/AT PEERL, EOMI Neck: Trachea midline,  no masses, no thyromegal,y no JVD, no carotid bruit OROPHARYNX:  Moist, No exudate/ erythema/lesions.  Heart: Regular rate and rhythm, without murmurs, rubs, gallops, PMI non-displaced, no heaves or thrills on palpation.  Lungs: Clear to auscultation, no wheezing or rhonchi noted. No increased vocal fremitus resonant to percussion  Abdomen: Soft, nontender, nondistended, positive bowel sounds, no masses no hepatosplenomegaly noted..  Neuro: No focal neurological deficits noted cranial nerves II through XII grossly intact. DTRs 2+ bilaterally upper and lower extremities. Strength 5 out of 5 in bilateral upper and lower extremities. Musculoskeletal: No warm swelling or erythema around joints, no spinal tenderness  noted. Psychiatric: Patient alert and oriented x3, good insight and cognition, good recent to remote recall. Lymph node survey: No cervical axillary or inguinal lymphadenopathy noted.  Lab Results:  Basic Metabolic Panel:    Component Value Date/Time   NA 142 06/27/2021 0355   NA 138 07/14/2019 1128   K 4.0 06/27/2021 0355   CL 105 06/27/2021 0355   CO2 29 06/27/2021 0355   BUN <5 (L) 06/27/2021 0355   BUN 7 07/14/2019 1128   CREATININE 0.47 06/27/2021 0355   GLUCOSE 104 (H) 06/27/2021 0355   CALCIUM 8.9 06/27/2021 0355   CBC:    Component Value Date/Time   WBC 5.0 06/27/2021 0355   HGB 7.3 (L) 06/27/2021 0355   HGB 9.1 (L) 07/14/2019 1128   HCT 21.7 (L) 06/27/2021 0355   HCT 27.8 (L) 07/14/2019 1128   PLT 285 06/27/2021 0355   PLT 435 07/14/2019 1128   MCV 97.3 06/27/2021 0355   MCV 103 (H) 07/14/2019 1128   NEUTROABS 3.8 06/23/2021 0725   NEUTROABS 5.0 07/14/2019 1128   LYMPHSABS 7.7 (H) 06/23/2021 0725   LYMPHSABS 7.3 (H) 07/14/2019 1128   MONOABS 1.0 06/23/2021 0725   EOSABS 0.1 06/23/2021 0725   EOSABS 0.3 07/14/2019 1128   BASOSABS 0.1 06/23/2021 0725   BASOSABS 0.1 07/14/2019 1128    Recent Results (from the past 240 hour(s))  Resp Panel by RT-PCR (Flu A&B, Covid) Nasopharyngeal Swab     Status: Abnormal   Collection Time: 06/21/21  1:22 PM   Specimen: Nasopharyngeal Swab;  Nasopharyngeal(NP) swabs in vial transport medium  Result Value Ref Range Status   SARS Coronavirus 2 by RT PCR POSITIVE (A) NEGATIVE Final    Comment: CRITICAL RESULT CALLED TO, READ BACK BY AND VERIFIED WITH: JESSICA LOUDERMILK, RN @ [phone] ON 06/21/2021 BY BROOKS, L (NOTE) SARS-CoV-2 target nucleic acids are DETECTED.  The SARS-CoV-2 RNA is generally detectable in upper respiratory specimens during the acute phase of infection. Positive results are indicative of the presence of the identified virus, but do not rule out bacterial infection or co-infection with other pathogens  not detected by the test. Clinical correlation with patient history and other diagnostic information is necessary to determine patient infection status. The expected result is Negative.  Fact Sheet for Patients: BloggerCourse.com  Fact Sheet for Healthcare Providers: SeriousBroker.it  This test is not yet approved or cleared by the [place] FDA and  has been authorized for detection and/or diagnosis of SARS-CoV-2 by FDA under an Emergency Use Authorization (EUA).  This EUA will remain in  effect (meaning this test can be used) for the duration of  the COVID-19 declaration under Section 564(b)(1) of the Act, 21 U.S.C. section 360bbb-3(b)(1), unless the authorization is terminated or revoked sooner.     Influenza A by PCR NEGATIVE NEGATIVE Final   Influenza B by PCR NEGATIVE NEGATIVE Final    Comment: (NOTE) The Xpert Xpress SARS-CoV-2/FLU/RSV plus assay is intended as an aid in the diagnosis of influenza from Nasopharyngeal swab specimens and should not be used as a sole basis for treatment. Nasal washings and aspirates are unacceptable for Xpert Xpress SARS-CoV-2/FLU/RSV testing.  Fact Sheet for Patients: BloggerCourse.com  Fact Sheet for Healthcare Providers: SeriousBroker.it  This test is not yet approved or cleared by the [place] FDA and has been authorized for detection and/or diagnosis of SARS-CoV-2 by FDA under an Emergency Use Authorization (EUA). This EUA will remain in effect (meaning this test can be used) for the duration of the COVID-19 declaration under Section 564(b)(1) of the Act, 21 U.S.C. section 360bbb-3(b)(1), unless the authorization is terminated or revoked.  Performed at [facility], [address], [place], [place] [address]   Urine Culture     Status: Abnormal   Collection Time: 06/21/21  8:00 PM   Specimen: Urine,  Clean Catch  Result Value Ref Range Status   Specimen Description   Final    URINE, CLEAN CATCH Performed at [facility], [address], [place], [place] [address]    Special Requests   Final    NONE Performed at [facility], [address], [place], [place] [address]    Culture MULTIPLE SPECIES PRESENT, SUGGEST RECOLLECTION (A)  Final   Report Status 06/23/2021 FINAL  Final    Studies/Results: No results found.  Medications: Scheduled Meds:  enoxaparin (LOVENOX) injection  40 mg Subcutaneous Q24H   folic acid  1 mg Oral Daily   gabapentin  300 mg Oral TID   HYDROmorphone   Intravenous Q4H   oxyCODONE  15 mg Oral Q12H   pantoprazole  40 mg Oral Daily   senna-docusate  1 tablet Oral BID   Continuous Infusions:  dextrose 5 % and 0.45% NaCl 10 mL/hr at 06/25/21 0316   diphenhydrAMINE     PRN Meds:.diphenhydrAMINE **OR** diphenhydrAMINE, guaiFENesin-dextromethorphan, naloxone **AND** sodium chloride flush, ondansetron (ZOFRAN) IV, oxyCODONE, phenol, polyethylene glycol  Consultants: None  Procedures: None  Antibiotics: None.  Refused remdesivir.  Assessment/Plan: Principal Problem:   Sickle cell  pain crisis (HCC) Active Problems:   Anemia of chronic disease   COVID-19   Hypotension   Sickle cell anemia with crisis (HCC)  COVID-19 infection: Clinically stable. Patient remains afebrile, no increased oxygen requirement. Patient refused remdesivir. We will continue symptomatic management. Patient encouraged to use incentive spirometry and to sit out of bed as often as tolerable. Hb Sickle Cell Disease with crisis: IVF at [facility].  Continue to wean weight based Dilaudid PCA sometimes today, continue IV Toradol 15 mg Q 6 H for total of 5 days, continue oral home pain medications as ordered. Monitor vitals very closely, Re-evaluate pain scale regularly, 2 L of Oxygen by [place]. Sickle Cell Anemia: Hemoglobin remains stable at baseline today.  There is no clinical indication for blood transfusion at this time.  Chronic pain Syndrome: Continue home medications.  Code Status: Full Code Family Communication: N/A Disposition Plan: For possible discharge home tomorrow.  Kara [name]  If 7PM-7AM, please contact night-coverage.  06/27/2021, 11:22 AM  LOS: 5 days

## 2021-06-27 NOTE — Plan of Care (Signed)
  Problem: Activity: Goal: Risk for activity intolerance will decrease Outcome: Progressing   Problem: Nutrition: Goal: Adequate nutrition will be maintained Outcome: Progressing   Problem: Coping: Goal: Level of anxiety will decrease Outcome: Progressing   Problem: Elimination: Goal: Will not experience complications related to bowel motility Outcome: Progressing   Problem: Pain Managment: Goal: General experience of comfort will improve Outcome: Progressing   

## 2021-06-27 NOTE — Care Management Important Message (Signed)
Important Message  Patient Details IM Letter given to the Patient. Name: Kara [name] MRN: [id number] Date of Birth: 06-28-1986   Medicare Important Message Given:  Yes     Caren Macadam 06/27/2021, 2:30 PM

## 2021-06-28 DIAGNOSIS — I959 Hypotension, unspecified: Secondary | ICD-10-CM | POA: Diagnosis not present

## 2021-06-28 DIAGNOSIS — U071 COVID-19: Secondary | ICD-10-CM | POA: Diagnosis not present

## 2021-06-28 DIAGNOSIS — D5700 Hb-SS disease with crisis, unspecified: Secondary | ICD-10-CM | POA: Diagnosis not present

## 2021-06-28 DIAGNOSIS — D638 Anemia in other chronic diseases classified elsewhere: Secondary | ICD-10-CM | POA: Diagnosis not present

## 2021-06-28 NOTE — Plan of Care (Signed)

## 2021-06-28 NOTE — Discharge Summary (Signed)
Physician Discharge Summary  Kara [name] INO:[id number] DOB: October 17, 1986 DOA: 06/21/2021  PCP: Bonney Aid, MD  Admit date: 06/21/2021  Discharge date: 06/28/2021  Discharge Diagnoses:  Principal Problem:   Sickle cell pain crisis ([place]) Active Problems:   Anemia of chronic disease   COVID-19   Hypotension   Sickle cell anemia with crisis [facility])   Discharge Condition: Stable  Disposition:  Pt is discharged home in good condition and is to follow up with [facility], Sickle Cell, MD this week to have labs evaluated. Kara [name] is instructed to increase activity slowly and balance with rest for the next few days, and use prescribed medication to complete treatment of pain  Diet: Regular Wt Readings from Last 3 Encounters:  06/22/21 58.2 kg  05/18/21 55.8 kg  04/28/21 54.9 kg    History of present illness:  Kara [name] is a 35 y.o. female with medical history significant for sickle cell disease (HbSS), chronic anemia, pulmonary HTN, GERD, anxiety/depression who presents with sickle cell pain.   States she hurts all over her body which is usual for her sickle cell pain crisis.  She was recently discharged from sickle cell clinic on 7/14.  States she has been hurting for since about 5 days ago.  She also developed cough productive of sputum, has sore throat, feels a metallic taste in her mouth and has had nausea and some vomiting.  Has felt night sweats at night.  Denies any dysuria increased urgency or frequency.  No chest pain or shortness of breath.   ED Course: She was afebrile, mildly hypertensive systolic of 09G.  WBC of 12.8. Hemoglobin of 9 which is her baseline. BMP unremarkable. COVID test was positive.  Patient unsure where she could have contracted this. Chest x-ray is negative.  Hospital Course:  Patient was admitted for COVID-19 infection, and sickle cell pain crisis and managed appropriately with IVF, IV Dilaudid via PCA and IV Toradol, as well  as other adjunct therapies per sickle cell pain management protocols.  She refused remdesivir or oral antiviral for COVID.  Her symptoms slowly improved, cough improved significantly, there was no shortness of breath or oxygen requirement throughout admission.  Patient remained afebrile throughout admission.  As of today, patient is at baseline of her clinical condition, tolerating p.o. intake with no restrictions and ambulating well with no significant pain.  Patient has remained stable hemodynamically throughout this admission.  Patient was therefore discharged home today in a hemodynamically stable condition.   Kara [name] will follow-up with PCP within 1 week of this discharge. Kara [name] was counseled extensively about nonpharmacologic means of pain management, patient verbalized understanding and was appreciative of  the care received during this admission.   We discussed the need for good hydration, monitoring of hydration status, avoidance of heat, cold, stress, and infection triggers. We discussed the need to be adherent with taking Hydrea and other home medications. Patient was reminded of the need to seek medical attention immediately if any symptom of bleeding, anemia, or infection occurs.  Discharge Exam: Vitals:   06/28/21 0348 06/28/21 1002  BP: 106/64   Pulse: 69   Resp: 19 18  Temp: 98.1 F (36.7 C)   SpO2: 95%    Vitals:   06/27/21 2024 06/28/21 0027 06/28/21 0348 06/28/21 1002  BP:   106/64   Pulse:   69   Resp: 12 12 19 18   Temp:   98.1 F (36.7 C)   TempSrc:   Oral  SpO2: 98% 96% 95%   Weight:      Height:        General appearance : Awake, alert, not in any distress. Speech Clear. Not toxic looking HEENT: Atraumatic and Normocephalic, pupils equally reactive to light and accomodation Neck: Supple, no JVD. No cervical lymphadenopathy.  Chest: Good air entry bilaterally, no added sounds  CVS: S1 S2 regular, no murmurs.  Abdomen: Bowel sounds present, Non tender and not  distended with no gaurding, rigidity or rebound. Extremities: B/L Lower Ext shows no edema, both legs are warm to touch Neurology: Awake alert, and oriented X 3, CN II-XII intact, Non focal Skin: No Rash  Discharge Instructions  Discharge Instructions     Diet - low sodium heart healthy   Complete by: As directed    Increase activity slowly   Complete by: As directed       Allergies as of 06/28/2021       Reactions   Kiwi Extract Hives   Morphine And Related Hives   Paroxetine Hives        Medication List     TAKE these medications    cetirizine 10 MG tablet Commonly known as: ZYRTEC Take 1 tablet (10 mg total) by mouth daily. What changed:  when to take this reasons to take this   folic acid 1 MG tablet Commonly known as: FOLVITE Take 1 tablet (1 mg total) by mouth daily.   gabapentin 300 MG capsule Commonly known as: NEURONTIN Take 1 capsule (300 mg total) by mouth 3 (three) times daily.   omeprazole 40 MG capsule Commonly known as: PRILOSEC Take 1 capsule (40 mg total) by mouth daily.   oxyCODONE 15 mg 12 hr tablet Commonly known as: OxyCONTIN Take 1 tablet (15 mg total) by mouth every 12 (twelve) hours.       ASK your doctor about these medications    Ensure Plus Liqd Take 237 mLs by mouth 3 (three) times daily between meals.   naloxone 4 MG/0.1ML Liqd nasal spray kit Commonly known as: NARCAN 1 spray in nostril, may use every 2-3 minutes in alternating nostrils while awaiting medical assistance   naproxen 500 MG tablet Commonly known as: NAPROSYN Take 1 tablet (500 mg total) by mouth 2 (two) times daily with a meal.   oxyCODONE 15 MG immediate release tablet Commonly known as: ROXICODONE Take 1 tablet (15 mg total) by mouth every 4 (four) hours as needed for up to 15 days.   Vitamin D (Ergocalciferol) 1.25 MG (50000 UNIT) Caps capsule Commonly known as: DRISDOL Take 1 capsule (50,000 Units total) by mouth every 7 (seven) days.         The results of significant diagnostics from this hospitalization (including imaging, microbiology, ancillary and laboratory) are listed below for reference.    Significant Diagnostic Studies: DG Chest Portable 1 View  Result Date: 06/21/2021 CLINICAL DATA:  Cough, rhinorrhea, sickle pain EXAM: PORTABLE CHEST 1 VIEW COMPARISON:  04/28/2021 FINDINGS: Cardiomegaly. Both lungs are clear. The visualized skeletal structures are unremarkable. IMPRESSION: Cardiomegaly without acute abnormality of the lungs in AP portable projection. Electronically Signed   By: Eddie Candle M.D.   On: 06/21/2021 13:31    Microbiology: Recent Results (from the past 240 hour(s))  Resp Panel by RT-PCR (Flu A&B, Covid) Nasopharyngeal Swab     Status: Abnormal   Collection Time: 06/21/21  1:22 PM   Specimen: Nasopharyngeal Swab; Nasopharyngeal(NP) swabs in vial transport medium  Result Value Ref Range Status  SARS Coronavirus 2 by RT PCR POSITIVE (A) NEGATIVE Final    Comment: CRITICAL RESULT CALLED TO, READ BACK BY AND VERIFIED WITH: JESSICA LOUDERMILK, RN @ [phone] ON 06/21/2021 BY BROOKS, L (NOTE) SARS-CoV-2 target nucleic acids are DETECTED.  The SARS-CoV-2 RNA is generally detectable in upper respiratory specimens during the acute phase of infection. Positive results are indicative of the presence of the identified virus, but do not rule out bacterial infection or co-infection with other pathogens not detected by the test. Clinical correlation with patient history and other diagnostic information is necessary to determine patient infection status. The expected result is Negative.  Fact Sheet for Patients: EntrepreneurPulse.com.au  Fact Sheet for Healthcare Providers: IncredibleEmployment.be  This test is not yet approved or cleared by the [place] FDA and  has been authorized for detection and/or diagnosis of SARS-CoV-2 by FDA under an Emergency Use Authorization  (EUA).  This EUA will remain in  effect (meaning this test can be used) for the duration of  the COVID-19 declaration under Section 564(b)(1) of the Act, 21 U.S.C. section 360bbb-3(b)(1), unless the authorization is terminated or revoked sooner.     Influenza A by PCR NEGATIVE NEGATIVE Final   Influenza B by PCR NEGATIVE NEGATIVE Final    Comment: (NOTE) The Xpert Xpress SARS-CoV-2/FLU/RSV plus assay is intended as an aid in the diagnosis of influenza from Nasopharyngeal swab specimens and should not be used as a sole basis for treatment. Nasal washings and aspirates are unacceptable for Xpert Xpress SARS-CoV-2/FLU/RSV testing.  Fact Sheet for Patients: EntrepreneurPulse.com.au  Fact Sheet for Healthcare Providers: IncredibleEmployment.be  This test is not yet approved or cleared by the [place] FDA and has been authorized for detection and/or diagnosis of SARS-CoV-2 by FDA under an Emergency Use Authorization (EUA). This EUA will remain in effect (meaning this test can be used) for the duration of the COVID-19 declaration under Section 564(b)(1) of the Act, 21 U.S.C. section 360bbb-3(b)(1), unless the authorization is terminated or revoked.  Performed at [facility], [place] [address] [place], [place] [address]   Urine Culture     Status: Abnormal   Collection Time: 06/21/21  8:00 PM   Specimen: Urine, Clean Catch  Result Value Ref Range Status   Specimen Description   Final    URINE, CLEAN CATCH Performed at [facility], [place] [address], [place], [place] [address]    Special Requests   Final    NONE Performed at [facility], [place] [address] [place], [place] [address]    Culture MULTIPLE SPECIES PRESENT, SUGGEST RECOLLECTION (A)  Final   Report Status 06/23/2021 FINAL  Final     Labs: Basic Metabolic Panel: Recent Labs  Lab 06/21/21 1321 06/23/21 0725 06/27/21 0355   NA 135 140 142  K 3.6 3.3* 4.0  CL 99 105 105  CO2 $Re'29 25 29  'GVP$ GLUCOSE 106* 103* 104*  BUN 6 <5* <5*  CREATININE 0.50 0.40* 0.47  CALCIUM 8.7* 8.5* 8.9   Liver Function Tests: Recent Labs  Lab 06/21/21 1321  AST 67*  ALT 21  ALKPHOS 51  BILITOT 6.4*  PROT 8.0  ALBUMIN 4.4   No results for input(s): LIPASE, AMYLASE in the last 168 hours. No results for input(s): AMMONIA in the last 168 hours. CBC: Recent Labs  Lab 06/21/21 1321 06/22/21 0100 06/23/21 0725 06/24/21 0353 06/25/21 0452 06/27/21 0355  WBC 12.8* 11.7* 13.0* 13.9* 10.7* 5.0  NEUTROABS 6.8 2.2 3.8  --   --   --  HGB 9.1* 7.0* 7.3* 7.7* 7.1* 7.3*  HCT 26.1* 19.4* 21.3* 22.9* 21.4* 21.7*  MCV 92.2 90.7 94.7 96.2 98.2 97.3  PLT 306 268 308 331 304 285   Cardiac Enzymes: No results for input(s): CKTOTAL, CKMB, CKMBINDEX, TROPONINI in the last 168 hours. BNP: Invalid input(s): POCBNP CBG: No results for input(s): GLUCAP in the last 168 hours.  Time coordinating discharge: 50 minutes  Signed:  [facility] 06/28/2021, 10:40 AM

## 2021-07-04 ENCOUNTER — Telehealth: Payer: Self-pay

## 2021-07-04 NOTE — Telephone Encounter (Signed)
Oxycodone  Folic Acid

## 2021-07-05 ENCOUNTER — Other Ambulatory Visit: Payer: Self-pay | Admitting: Family Medicine

## 2021-07-05 DIAGNOSIS — D571 Sickle-cell disease without crisis: Secondary | ICD-10-CM

## 2021-07-05 DIAGNOSIS — G8929 Other chronic pain: Secondary | ICD-10-CM

## 2021-07-05 MED ORDER — FOLIC ACID 1 MG PO TABS: 1.0000 mg | ORAL_TABLET | Freq: Every day | ORAL | 11 refills | Status: DC

## 2021-07-05 MED ORDER — OXYCODONE HCL 15 MG PO TABS: 15.0000 mg | ORAL_TABLET | ORAL | 0 refills | Status: DC | PRN

## 2021-07-05 NOTE — Progress Notes (Signed)
Reviewed PDMP substance reporting system prior to prescribing opiate medications. No inconsistencies noted.  Meds ordered this encounter  Medications   oxyCODONE (ROXICODONE) 15 MG immediate release tablet    Sig: Take 1 tablet (15 mg total) by mouth every 4 (four) hours as needed for pain.    Dispense:  90 tablet    Refill:  0    Order Specific Question:   Supervising Provider    Answer:   Quentin Angst [[id number]]   folic acid (FOLVITE) 1 MG tablet    Sig: Take 1 tablet (1 mg total) by mouth daily.    Dispense:  30 tablet    Refill:  11    Order Specific Question:   Supervising Provider    Answer:   Quentin Angst [[id number]]     Nolon Nations  APRN, MSN, FNP-C Patient Care [facility] [address] [place], [place] [address] [phone]

## 2021-07-11 ENCOUNTER — Telehealth: Payer: Self-pay

## 2021-07-11 NOTE — Telephone Encounter (Signed)
Oxycodone EXTENDED relief

## 2021-07-18 ENCOUNTER — Telehealth: Payer: Self-pay

## 2021-07-18 NOTE — Telephone Encounter (Signed)
Oxycodone Extended    Oxycodone Immediate

## 2021-07-19 ENCOUNTER — Other Ambulatory Visit: Payer: Self-pay | Admitting: Family Medicine

## 2021-07-19 DIAGNOSIS — D5700 Hb-SS disease with crisis, unspecified: Secondary | ICD-10-CM

## 2021-07-19 DIAGNOSIS — G8929 Other chronic pain: Secondary | ICD-10-CM

## 2021-07-19 DIAGNOSIS — F1120 Opioid dependence, uncomplicated: Secondary | ICD-10-CM

## 2021-07-19 DIAGNOSIS — D571 Sickle-cell disease without crisis: Secondary | ICD-10-CM

## 2021-07-19 MED ORDER — OXYCODONE HCL 15 MG PO TABS: 15.0000 mg | ORAL_TABLET | ORAL | 0 refills | Status: DC | PRN

## 2021-07-19 MED ORDER — OXYCODONE HCL ER 15 MG PO T12A: 15.0000 mg | EXTENDED_RELEASE_TABLET | Freq: Two times a day (BID) | ORAL | 0 refills | Status: DC

## 2021-07-19 NOTE — Progress Notes (Signed)
Reviewed PDMP substance reporting system prior to prescribing opiate medications. No inconsistencies noted.  Meds ordered this encounter  Medications   oxyCODONE (OXYCONTIN) 15 mg 12 hr tablet    Sig: Take 1 tablet (15 mg total) by mouth every 12 (twelve) hours.    Dispense:  60 tablet    Refill:  0    Order Specific Question:   Supervising Provider    Answer:   Quentin Angst [[id number]]   oxyCODONE (ROXICODONE) 15 MG immediate release tablet    Sig: Take 1 tablet (15 mg total) by mouth every 4 (four) hours as needed for pain.    Dispense:  90 tablet    Refill:  0    Order Specific Question:   Supervising Provider    Answer:   Quentin Angst [[id number]]     Nolon Nations  APRN, MSN, FNP-C Patient Care [facility] [address] [place], [place] [address] [phone]

## 2021-08-03 ENCOUNTER — Telehealth: Payer: Self-pay

## 2021-08-03 ENCOUNTER — Other Ambulatory Visit: Payer: Self-pay | Admitting: Family Medicine

## 2021-08-03 DIAGNOSIS — G8929 Other chronic pain: Secondary | ICD-10-CM

## 2021-08-03 DIAGNOSIS — D571 Sickle-cell disease without crisis: Secondary | ICD-10-CM

## 2021-08-03 MED ORDER — OXYCODONE HCL 15 MG PO TABS: 15.0000 mg | ORAL_TABLET | ORAL | 0 refills | Status: DC | PRN

## 2021-08-03 NOTE — Telephone Encounter (Signed)
Oxycodone  °

## 2021-08-03 NOTE — Progress Notes (Signed)
Reviewed PDMP substance reporting system prior to prescribing opiate medications. No inconsistencies noted.  Meds ordered this encounter  Medications   oxyCODONE (ROXICODONE) 15 MG immediate release tablet    Sig: Take 1 tablet (15 mg total) by mouth every 4 (four) hours as needed for pain.    Dispense:  90 tablet    Refill:  0    Order Specific Question:   Supervising Provider    Answer:   JEGEDE, OLUGBEMIGA E [[id number]]   Joeziah Voit Moore Maliea Grandmaison  APRN, MSN, FNP-C [facility] [address]  [place], [place] [address] [phone]  

## 2021-08-10 IMAGING — DX DG CHEST 1V PORT
1 series · 1 of 1 positions shown · non-contrast
Comparison: 08/27/2020

CLINICAL DATA: Low O2 sat sickle cell crisis

EXAM:
PORTABLE CHEST 1 VIEW

[chest ap]
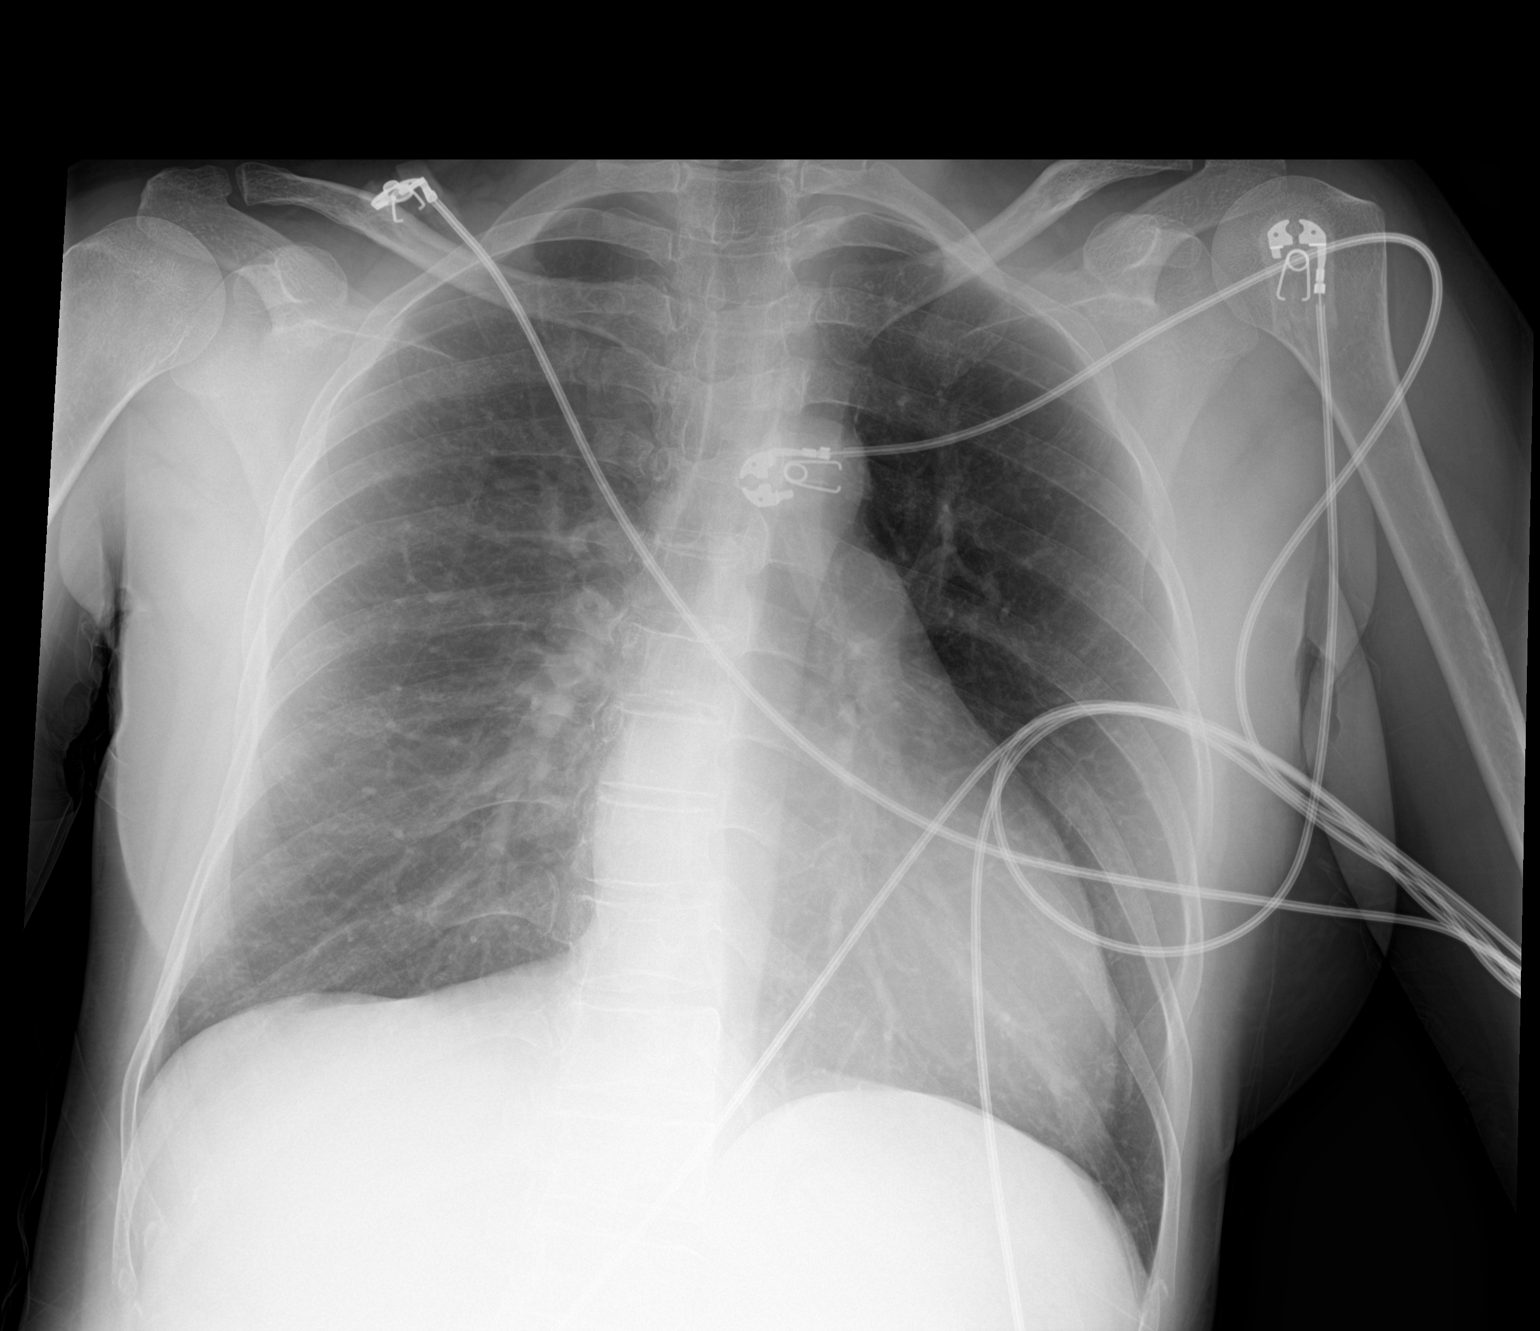

[1 of 1 positions shown; findings below may reference images not displayed]

FINDINGS: No focal opacity or pleural effusion. Stable cardiomediastinal
silhouette. No pneumothorax.
IMPRESSION: No active disease.

## 2021-08-15 ENCOUNTER — Emergency Department (HOSPITAL_COMMUNITY): Payer: Medicare Other

## 2021-08-15 ENCOUNTER — Encounter (HOSPITAL_COMMUNITY): Payer: Self-pay

## 2021-08-15 ENCOUNTER — Inpatient Hospital Stay (HOSPITAL_COMMUNITY)
Admission: EM | Admit: 2021-08-15 | Discharge: 2021-08-20 | DRG: 812 | Disposition: A | Discharge status: Home / Self Care | Payer: Medicare Other | Attending: Internal Medicine | Admitting: Internal Medicine

## 2021-08-15 ENCOUNTER — Other Ambulatory Visit: Payer: Self-pay

## 2021-08-15 DIAGNOSIS — Z888 Allergy status to other drugs, medicaments and biological substances status: Secondary | ICD-10-CM | POA: Diagnosis not present

## 2021-08-15 DIAGNOSIS — Z28310 Unvaccinated for covid-19: Secondary | ICD-10-CM | POA: Diagnosis not present

## 2021-08-15 DIAGNOSIS — F39 Unspecified mood [affective] disorder: Secondary | ICD-10-CM | POA: Diagnosis present

## 2021-08-15 DIAGNOSIS — D638 Anemia in other chronic diseases classified elsewhere: Secondary | ICD-10-CM | POA: Diagnosis present

## 2021-08-15 DIAGNOSIS — Z20822 Contact with and (suspected) exposure to covid-19: Secondary | ICD-10-CM | POA: Diagnosis present

## 2021-08-15 DIAGNOSIS — K219 Gastro-esophageal reflux disease without esophagitis: Secondary | ICD-10-CM | POA: Diagnosis present

## 2021-08-15 DIAGNOSIS — G894 Chronic pain syndrome: Secondary | ICD-10-CM | POA: Diagnosis present

## 2021-08-15 DIAGNOSIS — D5700 Hb-SS disease with crisis, unspecified: Secondary | ICD-10-CM

## 2021-08-15 DIAGNOSIS — M1990 Unspecified osteoarthritis, unspecified site: Secondary | ICD-10-CM | POA: Diagnosis present

## 2021-08-15 DIAGNOSIS — F17200 Nicotine dependence, unspecified, uncomplicated: Secondary | ICD-10-CM | POA: Diagnosis present

## 2021-08-15 DIAGNOSIS — F1120 Opioid dependence, uncomplicated: Secondary | ICD-10-CM | POA: Diagnosis present

## 2021-08-15 DIAGNOSIS — E876 Hypokalemia: Secondary | ICD-10-CM | POA: Diagnosis not present

## 2021-08-15 DIAGNOSIS — Z885 Allergy status to narcotic agent status: Secondary | ICD-10-CM

## 2021-08-15 DIAGNOSIS — G43909 Migraine, unspecified, not intractable, without status migrainosus: Secondary | ICD-10-CM | POA: Diagnosis present

## 2021-08-15 DIAGNOSIS — Z91018 Allergy to other foods: Secondary | ICD-10-CM | POA: Diagnosis not present

## 2021-08-15 DIAGNOSIS — F411 Generalized anxiety disorder: Secondary | ICD-10-CM | POA: Diagnosis present

## 2021-08-15 DIAGNOSIS — Z79899 Other long term (current) drug therapy: Secondary | ICD-10-CM | POA: Diagnosis not present

## 2021-08-15 DIAGNOSIS — F419 Anxiety disorder, unspecified: Secondary | ICD-10-CM | POA: Diagnosis present

## 2021-08-15 DIAGNOSIS — I272 Pulmonary hypertension, unspecified: Secondary | ICD-10-CM | POA: Diagnosis present

## 2021-08-15 DIAGNOSIS — D5701 Hb-SS disease with acute chest syndrome: Secondary | ICD-10-CM | POA: Diagnosis not present

## 2021-08-15 DIAGNOSIS — R079 Chest pain, unspecified: Secondary | ICD-10-CM

## 2021-08-15 LAB — RESP PANEL BY RT-PCR (FLU A&B, COVID) ARPGX2
Influenza A by PCR: NEGATIVE
Influenza B by PCR: NEGATIVE
SARS Coronavirus 2 by RT PCR: NEGATIVE

## 2021-08-15 LAB — RAPID URINE DRUG SCREEN, HOSP PERFORMED
Amphetamines: NOT DETECTED
Barbiturates: NOT DETECTED
Benzodiazepines: NOT DETECTED
Cocaine: NOT DETECTED
Opiates: POSITIVE — AB
Tetrahydrocannabinol: NOT DETECTED

## 2021-08-15 LAB — COMPREHENSIVE METABOLIC PANEL
ALT: 24 U/L (ref 0–44)
AST: 42 U/L — ABNORMAL HIGH (ref 15–41)
Albumin: 4.5 g/dL (ref 3.5–5.0)
Alkaline Phosphatase: 56 U/L (ref 38–126)
Anion gap: 9 (ref 5–15)
BUN: 6 mg/dL (ref 6–20)
CO2: 23 mmol/L (ref 22–32)
Calcium: 9.1 mg/dL (ref 8.9–10.3)
Chloride: 106 mmol/L (ref 98–111)
Creatinine, Ser: 0.45 mg/dL (ref 0.44–1.00)
GFR, Estimated: >60 mL/min (ref >60)
Glucose, Bld: 119 mg/dL — ABNORMAL HIGH (ref 70–99)
Potassium: 2.8 mmol/L — ABNORMAL LOW (ref 3.5–5.1)
Sodium: 138 mmol/L (ref 135–145)
Total Bilirubin: 6.1 mg/dL — ABNORMAL HIGH (ref 0.3–1.2)
Total Protein: 8.1 g/dL (ref 6.5–8.1)

## 2021-08-15 LAB — CBC WITH DIFFERENTIAL/PLATELET
Abs Immature Granulocytes: 0.5 10*3/uL — ABNORMAL HIGH (ref 0.00–0.07)
Basophils Absolute: 0.1 10*3/uL (ref 0.0–0.1)
Basophils Relative: 1 %
Eosinophils Absolute: 0.1 10*3/uL (ref 0.0–0.5)
Eosinophils Relative: 1 %
HCT: 23.2 % — ABNORMAL LOW (ref 36.0–46.0)
Hemoglobin: 8.2 g/dL — ABNORMAL LOW (ref 12.0–15.0)
Immature Granulocytes: 2 %
Lymphocytes Relative: 25 %
Lymphs Abs: 5.9 10*3/uL — ABNORMAL HIGH (ref 0.7–4.0)
MCH: 34.5 pg — ABNORMAL HIGH (ref 26.0–34.0)
MCHC: 35.3 g/dL (ref 30.0–36.0)
MCV: 97.5 fL (ref 80.0–100.0)
Monocytes Absolute: 2.3 10*3/uL — ABNORMAL HIGH (ref 0.1–1.0)
Monocytes Relative: 10 %
Neutro Abs: 14.8 10*3/uL — ABNORMAL HIGH (ref 1.7–7.7)
Neutrophils Relative %: 61 %
Platelets: 353 10*3/uL (ref 150–400)
RBC: 2.38 MIL/uL — ABNORMAL LOW (ref 3.87–5.11)
RDW: 22.1 % — ABNORMAL HIGH (ref 11.5–15.5)
WBC: 23.7 10*3/uL — ABNORMAL HIGH (ref 4.0–10.5)
nRBC: 1.1 % — ABNORMAL HIGH (ref 0.0–0.2)

## 2021-08-15 LAB — URINALYSIS, ROUTINE W REFLEX MICROSCOPIC
Glucose, UA: 50 mg/dL — AB
Hgb urine dipstick: NEGATIVE
Ketones, ur: NEGATIVE mg/dL
Leukocytes,Ua: NEGATIVE
Nitrite: NEGATIVE
Protein, ur: NEGATIVE mg/dL
Specific Gravity, Urine: 1.005 (ref 1.005–1.030)
pH: 6 (ref 5.0–8.0)

## 2021-08-15 LAB — LACTIC ACID, PLASMA: Lactic Acid, Venous: 0.8 mmol/L (ref 0.5–1.9)

## 2021-08-15 LAB — RETICULOCYTES
Immature Retic Fract: 50.7 % — ABNORMAL HIGH (ref 2.3–15.9)
RBC.: 2.37 MIL/uL — ABNORMAL LOW (ref 3.87–5.11)
Retic Count, Absolute: 367 10*3/uL — ABNORMAL HIGH (ref 19.0–186.0)
Retic Ct Pct: 15 % — ABNORMAL HIGH (ref 0.4–3.1)

## 2021-08-15 LAB — MRSA NEXT GEN BY PCR, NASAL: MRSA by PCR Next Gen: NOT DETECTED

## 2021-08-15 LAB — LACTATE DEHYDROGENASE: LDH: 324 U/L — ABNORMAL HIGH (ref 98–192)

## 2021-08-15 LAB — HCG, QUANTITATIVE, PREGNANCY: hCG, Beta Chain, Quant, S: <1 m[IU]/mL (ref <5)

## 2021-08-15 MED ORDER — SENNOSIDES-DOCUSATE SODIUM 8.6-50 MG PO TABS
1.0000 | ORAL_TABLET | Freq: Two times a day (BID) | ORAL | Status: DC
Start: 2021-08-15 — End: 2021-08-20
  Administered 2021-08-15 – 2021-08-19 (×7): 1 via ORAL
  Filled 2021-08-15 (×10): qty 1

## 2021-08-15 MED ORDER — OXYCODONE HCL ER 15 MG PO T12A
15.0000 mg | EXTENDED_RELEASE_TABLET | Freq: Two times a day (BID) | ORAL | Status: DC
  Administered 2021-08-15 – 2021-08-19 (×8): 15 mg via ORAL
  Filled 2021-08-15 (×11): qty 1

## 2021-08-15 MED ORDER — SODIUM CHLORIDE 0.9 % IV SOLN
2.0000 g | Freq: Once | INTRAVENOUS | Status: AC
  Administered 2021-08-15: 2 g via INTRAVENOUS
  Filled 2021-08-15: qty 2

## 2021-08-15 MED ORDER — FOLIC ACID 1 MG PO TABS
1.0000 mg | ORAL_TABLET | Freq: Every day | ORAL | Status: DC
  Administered 2021-08-15 – 2021-08-20 (×5): 1 mg via ORAL
  Filled 2021-08-15 (×7): qty 1

## 2021-08-15 MED ORDER — DEXTROSE-NACL 5-0.45 % IV SOLN: INTRAVENOUS | Status: DC

## 2021-08-15 MED ORDER — NALOXONE HCL 0.4 MG/ML IJ SOLN: 0.4000 mg | INTRAMUSCULAR | Status: DC | PRN

## 2021-08-15 MED ORDER — VANCOMYCIN HCL 750 MG/150ML IV SOLN
750.0000 mg | Freq: Two times a day (BID) | INTRAVENOUS | Status: DC
  Administered 2021-08-15: 750 mg via INTRAVENOUS
  Filled 2021-08-15 (×2): qty 150

## 2021-08-15 MED ORDER — OXYCODONE HCL 5 MG PO TABS
15.0000 mg | ORAL_TABLET | ORAL | Status: DC | PRN
  Administered 2021-08-15 – 2021-08-18 (×3): 15 mg via ORAL
  Filled 2021-08-15 (×3): qty 3

## 2021-08-15 MED ORDER — SODIUM CHLORIDE 0.9 % IV BOLUS
1000.0000 mL | Freq: Once | INTRAVENOUS | Status: AC
  Administered 2021-08-15: 1000 mL via INTRAVENOUS

## 2021-08-15 MED ORDER — SODIUM CHLORIDE 0.9% FLUSH
9.0000 mL | INTRAVENOUS | Status: DC | PRN
Start: 2021-08-15 — End: 2021-08-18

## 2021-08-15 MED ORDER — DIPHENHYDRAMINE HCL 50 MG/ML IJ SOLN
25.0000 mg | Freq: Once | INTRAMUSCULAR | Status: DC
  Filled 2021-08-15: qty 1

## 2021-08-15 MED ORDER — LORATADINE 10 MG PO TABS
10.0000 mg | ORAL_TABLET | Freq: Every day | ORAL | Status: DC
  Administered 2021-08-16 – 2021-08-19 (×3): 10 mg via ORAL
  Filled 2021-08-15 (×6): qty 1

## 2021-08-15 MED ORDER — POTASSIUM CHLORIDE CRYS ER 20 MEQ PO TBCR
40.0000 meq | EXTENDED_RELEASE_TABLET | Freq: Once | ORAL | Status: AC
  Administered 2021-08-15: 40 meq via ORAL
  Filled 2021-08-15: qty 2

## 2021-08-15 MED ORDER — HYDROMORPHONE HCL 2 MG/ML IJ SOLN
2.0000 mg | Freq: Once | INTRAMUSCULAR | Status: AC
  Administered 2021-08-15: 2 mg via INTRAVENOUS
  Filled 2021-08-15: qty 1

## 2021-08-15 MED ORDER — VANCOMYCIN HCL IN DEXTROSE 1-5 GM/200ML-% IV SOLN
1000.0000 mg | Freq: Once | INTRAVENOUS | Status: AC
  Administered 2021-08-15: 1000 mg via INTRAVENOUS
  Filled 2021-08-15: qty 200

## 2021-08-15 MED ORDER — HYDROMORPHONE 1 MG/ML IV SOLN
INTRAVENOUS | Status: DC
Start: 2021-08-15 — End: 2021-08-18
  Administered 2021-08-15: 30 mg via INTRAVENOUS
  Administered 2021-08-15: 5 mg via INTRAVENOUS
  Administered 2021-08-15: 3.5 mg via INTRAVENOUS
  Administered 2021-08-15: 2.5 mg via INTRAVENOUS
  Administered 2021-08-16: 0 mg via INTRAVENOUS
  Administered 2021-08-16: 1.5 mg via INTRAVENOUS
  Administered 2021-08-16: 2 mg via INTRAVENOUS
  Administered 2021-08-16: 3.5 mg via INTRAVENOUS
  Administered 2021-08-17: 5.5 mg via INTRAVENOUS
  Administered 2021-08-17: 3 mg via INTRAVENOUS
  Administered 2021-08-17: 4 mg via INTRAVENOUS
  Administered 2021-08-17: 30 mg via INTRAVENOUS
  Administered 2021-08-17: 3.5 mg via INTRAVENOUS
  Administered 2021-08-17: 6 mg via INTRAVENOUS
  Administered 2021-08-18: 4.5 mg via INTRAVENOUS
  Administered 2021-08-18: 3 mg via INTRAVENOUS
  Administered 2021-08-18: 2 mg via INTRAVENOUS
  Filled 2021-08-15 (×2): qty 30

## 2021-08-15 MED ORDER — HYDROMORPHONE HCL 1 MG/ML IJ SOLN
1.0000 mg | INTRAMUSCULAR | Status: DC | PRN
  Administered 2021-08-15: 1 mg via INTRAVENOUS
  Filled 2021-08-15: qty 1

## 2021-08-15 MED ORDER — POTASSIUM CHLORIDE 10 MEQ/100ML IV SOLN
10.0000 meq | Freq: Once | INTRAVENOUS | Status: AC
  Administered 2021-08-15: 10 meq via INTRAVENOUS
  Filled 2021-08-15: qty 100

## 2021-08-15 MED ORDER — DIPHENHYDRAMINE HCL 25 MG PO CAPS: 25.0000 mg | ORAL_CAPSULE | ORAL | Status: DC | PRN

## 2021-08-15 MED ORDER — HYDROMORPHONE HCL 2 MG/ML IJ SOLN
2.0000 mg | Freq: Once | INTRAMUSCULAR | Status: AC
Start: 2021-08-15 — End: 2021-08-15
  Administered 2021-08-15: 2 mg via INTRAVENOUS
  Filled 2021-08-15: qty 1

## 2021-08-15 MED ORDER — VANCOMYCIN HCL 750 MG/150ML IV SOLN: 750.0000 mg | Freq: Two times a day (BID) | INTRAVENOUS | Status: DC

## 2021-08-15 MED ORDER — VANCOMYCIN HCL 1250 MG/250ML IV SOLN: 1250.0000 mg | Freq: Two times a day (BID) | INTRAVENOUS | Status: DC

## 2021-08-15 MED ORDER — ONDANSETRON HCL 4 MG/2ML IJ SOLN: 4.0000 mg | Freq: Four times a day (QID) | INTRAMUSCULAR | Status: DC | PRN

## 2021-08-15 MED ORDER — ENOXAPARIN SODIUM 40 MG/0.4ML IJ SOSY
40.0000 mg | PREFILLED_SYRINGE | INTRAMUSCULAR | Status: DC
Start: 2021-08-15 — End: 2021-08-20
  Filled 2021-08-15 (×3): qty 0.4

## 2021-08-15 MED ORDER — SODIUM CHLORIDE 0.45 % IV SOLN: INTRAVENOUS | Status: DC

## 2021-08-15 MED ORDER — SODIUM CHLORIDE 0.9 % IV SOLN
2.0000 g | Freq: Three times a day (TID) | INTRAVENOUS | Status: DC
  Administered 2021-08-15 – 2021-08-18 (×9): 2 g via INTRAVENOUS
  Filled 2021-08-15 (×10): qty 2

## 2021-08-15 MED ORDER — POLYETHYLENE GLYCOL 3350 17 G PO PACK
17.0000 g | PACK | Freq: Every day | ORAL | Status: DC | PRN
  Filled 2021-08-15: qty 1

## 2021-08-15 MED ORDER — GABAPENTIN 300 MG PO CAPS
300.0000 mg | ORAL_CAPSULE | Freq: Three times a day (TID) | ORAL | Status: DC
  Administered 2021-08-15 – 2021-08-20 (×14): 300 mg via ORAL
  Filled 2021-08-15 (×16): qty 1

## 2021-08-15 MED ORDER — KETOROLAC TROMETHAMINE 15 MG/ML IJ SOLN
15.0000 mg | Freq: Four times a day (QID) | INTRAMUSCULAR | Status: AC
  Administered 2021-08-15 – 2021-08-20 (×19): 15 mg via INTRAVENOUS
  Filled 2021-08-15 (×20): qty 1

## 2021-08-15 MED ORDER — KETOROLAC TROMETHAMINE 30 MG/ML IJ SOLN
30.0000 mg | Freq: Once | INTRAMUSCULAR | Status: AC
  Administered 2021-08-15: 30 mg via INTRAVENOUS
  Filled 2021-08-15: qty 1

## 2021-08-15 NOTE — ED Triage Notes (Signed)
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 Pt complains of pain in her neck, back, and legs.

## 2021-08-15 NOTE — ED Provider Notes (Signed)
Patient care transferred from Dr. Judd Lien Physical Exam  BP 135/74 (BP Location: Right Arm)   Pulse 92   Temp 99.3 F (37.4 C) (Oral)   Resp 16   SpO2 96%   Physical Exam  ED Course/Procedures     Procedures  MDM  35 yo female ssD presents with acute chest syndrome, right lung bse opacity- treated with vanc and cefepime, fluid-one liter, dilaudid 2 x 2, potassium being replenished. Dr. Judd Lien discussed with Dr. Ophelia Charter and plan consult to sickle cell service at 8 a.  Patient with some pain improvement. VSS Discussed with [place] Hollis, NP who will see and evaluate    Margarita Grizzle, MD 08/15/21 [phone]

## 2021-08-15 NOTE — Progress Notes (Signed)
Pharmacy Antibiotic Note  Kara [name] is a 35 y.o. female admitted on 08/15/2021 with Sickle cell crisis pain, probable PNA, acute chest syndrome.  Pharmacy has been consulted for Vancomycin & Cefepime dosing.  Plan: Cefepime 2gm q8 Vancomycin 1gm in ED, then 750mg  q12, AUC 474, using SCr 0.8    Temp (24hrs), Avg:99.3 F (37.4 C), Min:99.3 F (37.4 C), Max:99.3 F (37.4 C)  Recent Labs  Lab 08/15/21 0520 08/15/21 0621  WBC 23.7*  --   CREATININE 0.45  --   LATICACIDVEN  --  0.8    CrCl cannot be calculated (Unknown ideal weight.).    Allergies  Allergen Reactions   Kiwi Extract Hives   Morphine And Related Hives   Paroxetine Hives    Antimicrobials this admission: 9/26 Cefepime >>  9/26 Vancomycin >>   Dose adjustments this admission:  Microbiology results: 9/26 BCx: sent  Thank you for allowing pharmacy to be a part of this patient's care.  10/26 PharmD 08/15/2021 10:11 AM

## 2021-08-15 NOTE — ED Notes (Signed)
Patient requesting more pain meds. MD notified no new orders at this time.

## 2021-08-15 NOTE — H&P (Signed)
H&P  Patient Demographics:  Kara [name], is a 35 y.o. female  MRN: [id number]   DOB - 12-15-1985  Admit Date - 08/15/2021  Outpatient Primary MD for the patient is Rounding, Sickle Cell, MD  Chief Complaint  Patient presents with   Sickle Cell Pain Crisis      HPI:   Kara [name]  is a 35 y.o. female with a medical history significant for sickle cell disease, chronic pain syndrome, opiate dependence and tolerance, and history of anemia of chronic disease presented to the emergency department with allover body pain and shortness of breath over the past several days.  Patient states that pain intensity has been increased and unrelieved by her home medications.  Home medications include OxyContin and oxycodone.  Patient last had these medications this a.m. without any sustained relief.  She attributes current pain crisis to changes in weather.  She endorses shortness of breath and chest pain.  She says that chest pain increases with inspiration.  She denies headache, dizziness, persistent cough, fever, chills.  No urinary symptoms, nausea, vomiting, or diarrhea.  She has had no sick contacts, recent travel.  She was hospitalized with COVID-19 around 1 month ago.  Patient has not been vaccinated against COVID-19.   ER Course:  Vital signs show: BP 121/62   Pulse 84   Temp 99.3 F (37.4 C) (Oral)   Resp 14   SpO2 96% .  Complete blood count shows WBCs 23.7, hemoglobin 8.2, platelets 353,000.  LDH 324.  Absolute reticulocytes 367.  Quantitative hCG negative.  Starts COVID-19, and influenza A and B negative.  Sodium 138, potassium 2.8, chloride 106, CO2 23, glucose 119, creatinine 0.45, AST 42, ALT 24, and total bilirubin 6.1. Chest x-ray shows confluent right new lung base opacity that is compatible with acute chest syndrome.  Lower lung volumes.  Minor left lung base atelectasis suspected.  Stable mild cardiomegaly. Sickle cell team notified for admission.  Patient's pain persists despite IV  Dilaudid, IV fluids, and IV Toradol.  Patient will be admitted for HCAP versus acute chest syndrome in the setting of sickle cell pain crisis.   Review of systems:  Review of Systems  Constitutional:  Positive for malaise/fatigue. Negative for chills and fever.  Respiratory:  Positive for shortness of breath. Negative for sputum production.   Cardiovascular:  Positive for chest pain.  Gastrointestinal: Negative.   Genitourinary: Negative.   Musculoskeletal:  Positive for back pain and joint pain.  Skin: Negative.   Neurological: Negative.   Psychiatric/Behavioral: Negative.     With Past History of the following :   Past Medical History:  Diagnosis Date   Arthritis    Chronic migraine    GERD (gastroesophageal reflux disease)    Hx of cholecystectomy 2015   Pulmonary hypertension (HCC)    Sickle cell anemia (HCC)    Tendinitis    left elbow      Past Surgical History:  Procedure Laterality Date   CHOLECYSTECTOMY     DILATION AND CURETTAGE OF UTERUS     GALLBLADDER SURGERY       Social History:   Social History   Tobacco Use   Smoking status: Every Day    Packs/day: 0.50    Types: Cigarettes   Smokeless tobacco: Never  Substance Use Topics   Alcohol use: No     Lives - At home   Family History :   Family History  Adopted: Yes     Home Medications:  Prior to Admission medications   Medication Sig Start Date End Date Taking? Authorizing Provider  cetirizine (ZYRTEC) 10 MG tablet Take 1 tablet (10 mg total) by mouth daily. Patient taking differently: Take 10 mg by mouth daily as needed for allergies. 04/14/20  Yes [name], FNP  gabapentin (NEURONTIN) 300 MG capsule Take 1 capsule (300 mg total) by mouth 3 (three) times daily. 05/05/21  Yes Massie Maroon, FNP  naloxone [facility]) nasal spray 4 mg/0.1 mL 1 spray in nostril, may use every 2-3 minutes in alternating nostrils while awaiting medical assistance Patient taking differently: Place 1 spray  into the nose once as needed (overdose). 1 spray in nostril, may use every 2-3 minutes in alternating nostrils while awaiting medical assistance 05/05/21  Yes Massie Maroon, FNP  omeprazole (PRILOSEC) 40 MG capsule Take 1 capsule (40 mg total) by mouth daily. 04/14/20  Yes [name], FNP  oxyCODONE (OXYCONTIN) 15 mg 12 hr tablet Take 1 tablet (15 mg total) by mouth every 12 (twelve) hours. 07/19/21  Yes Massie Maroon, FNP  oxyCODONE (ROXICODONE) 15 MG immediate release tablet Take 1 tablet (15 mg total) by mouth every 4 (four) hours as needed for pain. 08/05/21  Yes Massie Maroon, FNP  folic acid (FOLVITE) 1 MG tablet Take 1 tablet (1 mg total) by mouth daily. Patient not taking: No sig reported 07/05/21   Massie Maroon, FNP     Allergies:   Allergies  Allergen Reactions   Kiwi Extract Hives   Morphine And Related Hives   Paroxetine Hives     Physical Exam:   Vitals:   Vitals:   08/15/21 0900 08/15/21 0932  BP: 106/83 121/62  Pulse: 100 84  Resp: 17 14  Temp:    SpO2: 100% 96%    Physical Exam: Constitutional: Patient appears well-developed and well-nourished.  Moderate distress, ill-appearing HENT: Normocephalic, atraumatic, External right and left ear normal. Oropharynx is clear and moist.  Eyes: Conjunctivae and EOM are normal. PERRLA, no scleral icterus. Neck: Normal ROM. Neck supple. No JVD. No tracheal deviation. No thyromegaly. CVS: RRR, S1/S2 +, no murmurs, no gallops, no carotid bruit.  Pulmonary: Effort and breath sounds normal, no stridor, rhonchi, wheezes, rales.  Abdominal: Soft. BS +, no distension, tenderness, rebound or guarding.  Musculoskeletal: Normal range of motion. No edema and no tenderness.  Lymphadenopathy: No lymphadenopathy noted, cervical, inguinal or axillary Neuro: Alert. Normal reflexes, muscle tone coordination. No cranial nerve deficit. Skin: Skin is warm and dry. No rash noted. Not diaphoretic. No erythema. No  pallor. Psychiatric: Normal mood and affect. Behavior, judgment, thought content normal.   Data Review:   CBC Recent Labs  Lab 08/15/21 0520  WBC 23.7*  HGB 8.2*  HCT 23.2*  PLT 353  MCV 97.5  MCH 34.5*  MCHC 35.3  RDW 22.1*  LYMPHSABS 5.9*  MONOABS 2.3*  EOSABS 0.1  BASOSABS 0.1   ------------------------------------------------------------------------------------------------------------------  Chemistries  Recent Labs  Lab 08/15/21 0520  NA 138  K 2.8*  CL 106  CO2 23  GLUCOSE 119*  BUN 6  CREATININE 0.45  CALCIUM 9.1  AST 42*  ALT 24  ALKPHOS 56  BILITOT 6.1*   ------------------------------------------------------------------------------------------------------------------ CrCl cannot be calculated (Unknown ideal weight.). ------------------------------------------------------------------------------------------------------------------ No results for input(s): TSH, T4TOTAL, T3FREE, THYROIDAB in the last 72 hours.  Invalid input(s): FREET3  Coagulation profile No results for input(s): INR, PROTIME in the last 168 hours. ------------------------------------------------------------------------------------------------------------------- No results for input(s): DDIMER in the last  72 hours. -------------------------------------------------------------------------------------------------------------------  Cardiac Enzymes No results for input(s): CKMB, TROPONINI, MYOGLOBIN in the last 168 hours.  Invalid input(s): CK ------------------------------------------------------------------------------------------------------------------ No results found for: BNP  ---------------------------------------------------------------------------------------------------------------  Urinalysis    Component Value Date/Time   COLORURINE YELLOW 08/15/2021 0643   APPEARANCEUR CLEAR 08/15/2021 0643   LABSPEC 1.005 08/15/2021 0643   PHURINE 6.0 08/15/2021 0643   GLUCOSEU  50 (A) 08/15/2021 0643   HGBUR NEGATIVE 08/15/2021 0643   BILIRUBINUR SMALL (A) 08/15/2021 0643   BILIRUBINUR small 06/11/2020 1217   KETONESUR NEGATIVE 08/15/2021 0643   PROTEINUR NEGATIVE 08/15/2021 0643   UROBILINOGEN >=8.0 (A) 06/11/2020 1217   NITRITE NEGATIVE 08/15/2021 0643   LEUKOCYTESUR NEGATIVE 08/15/2021 0643    ----------------------------------------------------------------------------------------------------------------   Imaging Results:    DG Chest Port 1 View  Result Date: 08/15/2021 CLINICAL DATA:  35 year old female with chest pain, sickle cell disease. Neck back and extremity pain. EXAM: PORTABLE CHEST 1 VIEW COMPARISON:  Portable chest 06/21/2021 and earlier. FINDINGS: Portable AP semi upright view at 0614 hours. Lower lung volumes with accentuation of cardiac size, mild underlying cardiomegaly. Other mediastinal contours are within normal limits. Visualized tracheal air column is within normal limits. No pneumothorax or pulmonary edema. But there is confluent new patchy opacity at the right lung base. Minimal increased markings at the left lung base. Stable visualized osseous structures. Paucity of bowel gas in the upper abdomen. Incidental nipple piercings. IMPRESSION: 1. Confluent new right lung base opacity compatible with Acute Chest Syndrome in this setting. 2. Lower lung volumes. Minor left lung base atelectasis suspected. Stable mild cardiomegaly. Electronically Signed   By: Odessa Fleming M.D.   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 Chest x-ray shows confluent new right lung base opacity that is comparable with acute chest syndrome.  Oxygen saturation 98% on 2 L supplemental oxygen.  Saturation ranging between  80-86% on room air.  WBCs elevated at 23.7. Initiate empiric antibiotics.  Cefepime and vancomycin per pharmacy consult. MRSA per PCR pending.  Blood cultures negative so far.  Lactic acid negative. Repeat CBC in AM.  Tylenol 650 mg every 6 hours as needed for fever. Supplemental oxygen Incentive spirometer   Sickle cell disease with pain crisis: Initiate IV Dilaudid PCA IV fluids, 0.45% saline at 100 mL/h Toradol 15 mg IV every 6 hours for total of 5 days Monitor vital signs very closely, reevaluate pain scale regularly, and supplemental oxygen as needed  Chronic pain syndrome: Continue home medications  Leukocytosis: Refer to #1  Anemia of chronic disease: Hemoglobin 8.2, consistent with patient's baseline.  Continue folic acid 1 mg daily there is no clinical indication for blood transfusion at this time.  Continue to follow closely.  History of generalized anxiety: Stable.  Patient is currently not on any medications.  She is not followed by psychiatry as an outpatient.  She denies any suicidal or homicidal intentions.  Follow closely.   DVT Prophylaxis: Subcut Lovenox   AM Labs Ordered, also please review Full Orders  Family Communication: Admission, patient's condition and plan of care including tests being ordered have been discussed with the patient who indicate understanding and agree with the plan and Code Status.  Code Status:  Full Code  Consults called: None    Admission status: Inpatient    Time spent in minutes : 50 minutes  Nolon Nations  APRN, MSN, FNP-C Patient [facility] [address] [place], [place] [address] [phone]  08/15/2021 at 10:04 AM

## 2021-08-15 NOTE — ED Provider Notes (Signed)
[facility]-EMERGENCY DEPT Provider Note   CSN: [id number] Arrival date & time: 08/15/21  0415     History Chief Complaint  Patient presents with   Sickle Cell Pain Crisis    Kara [name] is a 35 y.o. female.  Patient is a 35 year old female with past medical history of sickle cell anemia.  She presents today with complaints of "pain all over".  She describes pain in her extremities, chest, and back.  She describes difficulty breathing and cough and pain with inspiration.  She has tried her home medications with little relief.  The history is provided by the patient.  Sickle Cell Pain Crisis Location:  Upper extremity, lower extremity, chest and back Severity:  Severe Onset quality:  Sudden Duration:  2 days Timing:  Constant Progression:  Worsening Relieved by:  Nothing Worsened by:  Movement and deep breathing Ineffective treatments:  Prescription drugs Associated symptoms: chest pain and fatigue   Associated symptoms: no fever       Past Medical History:  Diagnosis Date   Arthritis    Chronic migraine    GERD (gastroesophageal reflux disease)    Hx of cholecystectomy 2015   Pulmonary hypertension (HCC)    Sickle cell anemia (HCC)    Tendinitis    left elbow    Patient Active Problem List   Diagnosis Date Noted   COVID-19 06/22/2021   Hypotension 06/22/2021   Sickle cell anemia with crisis (HCC) 06/22/2021   Sickle cell anemia (HCC) 04/17/2021   Chronic post-traumatic stress disorder (PTSD) 03/01/2021   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HCC) 03/01/2021   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   Pain and swelling of left lower extremity 09/01/2020   Abscess of right buttock 04/17/2020   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 04/13/2020   Sickle cell anemia with pain (HCC) 12/08/2019   Decreased appetite 08/28/2019   Chronic pain of right knee 07/25/2019   Gastroesophageal reflux disease without esophagitis 25-Jul-2019   Anxiety 25-Jul-2019    Death of family member 25-Jul-2019   Chronic, continuous use of opioids 07/25/19   Headache, new daily persistent ([facility]) 07/04/2019   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   Chronic pain syndrome 12/27/2018   Anemia of chronic disease 12/27/2018   Pulmonary hypertension (HCC) 04/04/2018   Sickle cell disease (HCC) 04/04/2018   Arthritis 04/04/2018   Chest pain on breathing    Leukocytosis 02/06/2018   Sickle cell crisis (HCC) 02/05/2018   Sickle cell pain crisis (HCC) 10/29/2017   Other social stressor 04/28/2017   Mood disorder (HCC) 03/10/2017   Complex care coordination 06/02/2016   Tobacco abuse 07/07/2012   Contraception 12/08/2010    Past Surgical History:  Procedure Laterality Date   CHOLECYSTECTOMY     DILATION AND CURETTAGE OF UTERUS     GALLBLADDER SURGERY       OB History     Gravida  1   Para      Term      Preterm      AB  1   Living         SAB  1   IAB      Ectopic      Multiple      Live Births              Family History  Adopted: Yes    Social History   Tobacco Use   Smoking status: Every Day    Packs/day: 0.50    Types: Cigarettes  Smokeless tobacco: Never  Vaping Use   Vaping Use: Never used  Substance Use Topics   Alcohol use: No   Drug use: Not Currently    Types: Marijuana    Home Medications Prior to Admission medications   Medication Sig Start Date End Date Taking? Authorizing Provider  cetirizine (ZYRTEC) 10 MG tablet Take 1 tablet (10 mg total) by mouth daily. 04/14/20   [name], FNP  Ensure Plus (ENSURE PLUS) LIQD Take 237 mLs by mouth 3 (three) times daily between meals. Patient taking differently: Take 237 mLs by mouth daily. 08/26/19   [name], FNP  folic acid (FOLVITE) 1 MG tablet Take 1 tablet (1 mg total) by mouth daily. 07/05/21   Massie Maroon, FNP  gabapentin (NEURONTIN) 300 MG capsule Take 1 capsule (300 mg total) by mouth 3 (three) times daily. 05/05/21   Massie Maroon, FNP  naloxone [facility]) nasal spray 4 mg/0.1 mL 1 spray in nostril, may use every 2-3 minutes in alternating nostrils while awaiting medical assistance Patient taking differently: Place 1 spray into the nose once as needed (overdose). 1 spray in nostril, may use every 2-3 minutes in alternating nostrils while awaiting medical assistance 05/05/21   Massie Maroon, FNP  naproxen (NAPROSYN) 500 MG tablet Take 1 tablet (500 mg total) by mouth 2 (two) times daily with a meal. Patient taking differently: Take 500 mg by mouth 2 (two) times daily as needed for mild pain. 11/27/20   [name], FNP  omeprazole (PRILOSEC) 40 MG capsule Take 1 capsule (40 mg total) by mouth daily. 04/14/20   [name], FNP  oxyCODONE (OXYCONTIN) 15 mg 12 hr tablet Take 1 tablet (15 mg total) by mouth every 12 (twelve) hours. 07/19/21   Massie Maroon, FNP  oxyCODONE (ROXICODONE) 15 MG immediate release tablet Take 1 tablet (15 mg total) by mouth every 4 (four) hours as needed for pain. 08/05/21   Massie Maroon, FNP  Vitamin D, Ergocalciferol, (DRISDOL) 1.25 MG (50000 UNIT) CAPS capsule Take 1 capsule (50,000 Units total) by mouth every 7 (seven) days. Patient not taking: No sig reported 04/14/20   [name], FNP    Allergies    Kiwi extract, Morphine and related, and Paroxetine  Review of Systems   Review of Systems  Constitutional:  Positive for fatigue. Negative for fever.  Cardiovascular:  Positive for chest pain.  All other systems reviewed and are negative.  Physical Exam Updated Vital Signs BP 135/74 (BP Location: Right Arm)   Pulse 92   Temp 99.3 F (37.4 C) (Oral)   Resp 16   SpO2 96%   Physical Exam Vitals and nursing note reviewed.  Constitutional:      General: She is not in acute distress.    Appearance: She is well-developed. She is not diaphoretic.     Comments: Patient is a 35 year old female who appears uncomfortable.  She is awake and alert.  HENT:      Head: Normocephalic and atraumatic.  Cardiovascular:     Rate and Rhythm: Normal rate and regular rhythm.     Heart sounds: No murmur heard.   No friction rub. No gallop.  Pulmonary:     Effort: Pulmonary effort is normal. No respiratory distress.     Breath sounds: Normal breath sounds. No wheezing.  Abdominal:     General: Bowel sounds are normal. There is no distension.     Palpations: Abdomen is soft.  Tenderness: There is no abdominal tenderness.  Musculoskeletal:        General: Normal range of motion.     Cervical back: Normal range of motion and neck supple.  Skin:    General: Skin is warm and dry.  Neurological:     General: No focal deficit present.     Mental Status: She is alert and oriented to person, place, and time.    ED Results / Procedures / Treatments   Labs (all labs ordered are listed, but only abnormal results are displayed) Labs Reviewed  COMPREHENSIVE METABOLIC PANEL - Abnormal; Notable for the following components:      Result Value   Potassium 2.8 (*)    Glucose, Bld 119 (*)    AST 42 (*)    Total Bilirubin 6.1 (*)    All other components within normal limits  RETICULOCYTES - Abnormal; Notable for the following components:   Retic Ct Pct 15.0 (*)    RBC. 2.37 (*)    Retic Count, Absolute 367.0 (*)    Immature Retic Fract 50.7 (*)    All other components within normal limits  CBC WITH DIFFERENTIAL/PLATELET - Abnormal; Notable for the following components:   WBC 23.7 (*)    RBC 2.38 (*)    Hemoglobin 8.2 (*)    HCT 23.2 (*)    MCH 34.5 (*)    RDW 22.1 (*)    nRBC 1.1 (*)    Neutro Abs 14.8 (*)    Lymphs Abs 5.9 (*)    Monocytes Absolute 2.3 (*)    Abs Immature Granulocytes 0.50 (*)    All other components within normal limits  URINALYSIS, ROUTINE W REFLEX MICROSCOPIC - Abnormal; Notable for the following components:   Glucose, UA 50 (*)    Bilirubin Urine SMALL (*)    Bacteria, UA RARE (*)    All other components within normal limits   CULTURE, BLOOD (ROUTINE X 2)  CULTURE, BLOOD (ROUTINE X 2)  RESP PANEL BY RT-PCR (FLU A&B, COVID) ARPGX2  HCG, QUANTITATIVE, PREGNANCY  LACTIC ACID, PLASMA  RAPID URINE DRUG SCREEN, HOSP PERFORMED    EKG None  Radiology DG Chest Port 1 View  Result Date: 08/15/2021 CLINICAL DATA:  35 year old female with chest pain, sickle cell disease. Neck back and extremity pain. EXAM: PORTABLE CHEST 1 VIEW COMPARISON:  Portable chest 06/21/2021 and earlier. FINDINGS: Portable AP semi upright view at 0614 hours. Lower lung volumes with accentuation of cardiac size, mild underlying cardiomegaly. Other mediastinal contours are within normal limits. Visualized tracheal air column is within normal limits. No pneumothorax or pulmonary edema. But there is confluent new patchy opacity at the right lung base. Minimal increased markings at the left lung base. Stable visualized osseous structures. Paucity of bowel gas in the upper abdomen. Incidental nipple piercings. IMPRESSION: 1. Confluent new right lung base opacity compatible with Acute Chest Syndrome in this setting. 2. Lower lung volumes. Minor left lung base atelectasis suspected. Stable mild cardiomegaly. Electronically Signed   By: Odessa Fleming M.D.   On: 08/15/2021 06:19    Procedures Procedures   Medications Ordered in ED Medications  dextrose 5 %-0.45 % sodium chloride infusion ( Intravenous New Bag/Given 08/15/21 0531)  diphenhydrAMINE (BENADRYL) injection 25 mg (25 mg Intravenous Patient Refused/Not Given 08/15/21 0557)  vancomycin (VANCOCIN) IVPB 1000 mg/200 mL premix (1,000 mg Intravenous New Bag/Given 08/15/21 0652)  sodium chloride 0.9 % bolus 1,000 mL (0 mLs Intravenous Stopped 08/15/21 0652)  ketorolac (TORADOL) 30 MG/ML  injection 30 mg (30 mg Intravenous Given 08/15/21 0556)  HYDROmorphone (DILAUDID) injection 2 mg (2 mg Intravenous Given 08/15/21 0555)  ceFEPIme (MAXIPIME) 2 g in sodium chloride 0.9 % 100 mL IVPB (0 g Intravenous Stopped 08/15/21  0653)  HYDROmorphone (DILAUDID) injection 2 mg (2 mg Intravenous Given 08/15/21 1423)    ED Course  I have reviewed the triage vital signs and the nursing notes.  Pertinent labs & imaging results that were available during my care of the patient were reviewed by me and considered in my medical decision making (see chart for details).    MDM Rules/Calculators/A&P  Patient with history of sickle cell disease presenting with complaints of generalized pain, most notably chest pain and feeling short of breath.  Patient's oxygen saturations in the ER are 90% and chest x-ray is suggestive of acute chest syndrome.  Her white count is 24,000.  Blood cultures and urine culture obtained.  Broad-spectrum antibiotics administered.  Patient will require admission.  I attempted to consult the hospitalist, however they are busy admitting other patients and have recommended we call the sickle cell team at 8 AM when the office opens.  CRITICAL CARE Performed by: Geoffery Lyons Total critical care time: 40 minutes Critical care time was exclusive of separately billable procedures and treating other patients. Critical care was necessary to treat or prevent imminent or life-threatening deterioration. Critical care was time spent personally by me on the following activities: development of treatment plan with patient and/or surrogate as well as nursing, discussions with consultants, evaluation of patient's response to treatment, examination of patient, obtaining history from patient or surrogate, ordering and performing treatments and interventions, ordering and review of laboratory studies, ordering and review of radiographic studies, pulse oximetry and re-evaluation of patient's condition.   Final Clinical Impression(s) / ED Diagnoses Final diagnoses:  None    Rx / DC Orders ED Discharge Orders     None        Geoffery Lyons, MD 08/15/21 [phone]

## 2021-08-16 DIAGNOSIS — D5701 Hb-SS disease with acute chest syndrome: Secondary | ICD-10-CM | POA: Diagnosis not present

## 2021-08-16 MED ORDER — ACETAMINOPHEN 325 MG PO TABS
650.0000 mg | ORAL_TABLET | ORAL | Status: DC | PRN
  Administered 2021-08-18: 650 mg via ORAL
  Filled 2021-08-16: qty 2

## 2021-08-16 MED ORDER — ACETAMINOPHEN 325 MG PO TABS
650.0000 mg | ORAL_TABLET | Freq: Once | ORAL | Status: AC
  Administered 2021-08-16: 650 mg via ORAL
  Filled 2021-08-16: qty 2

## 2021-08-16 NOTE — BH Specialist Note (Signed)
[facility]ated [facility]al [facility] Case Management Referral Note  08/16/2021 Name: Kara [name] MRN: [id number] DOB: February 19, 1986 [name] is a 35 y.o. year old female who sees Rounding, Sickle Cell, MD for primary care. LCSW was consulted to assess patient's needs and assist the patient with [employer] .  Interpreter: No.   Interpreter Name & Language: none  Assessment: Patient has sickle cell disease.  Intervention: CSW met with patient at bedside in [place]. Introduced self and role at Patient [name]). Provided CSW contact information and advised patient to contact if needed. Patient no needs at this time.  Review of patient status, including review of consultants reports, relevant laboratory and other test results, and collaboration with appropriate care team members and the patient's provider was performed as part of comprehensive patient evaluation and provision of services.    [name], [facility] [phone]

## 2021-08-16 NOTE — Progress Notes (Signed)
   08/16/21 0618  Assess: MEWS Score  Temp (!) 102.1 F (38.9 C)  BP 123/65  Pulse Rate 96  Resp 20  Level of Consciousness Alert  SpO2 95 %  O2 Device Nasal Cannula  O2 Flow Rate (L/min) 2 L/min  Assess: MEWS Score  MEWS Temp 2  MEWS Systolic 0  MEWS Pulse 0  MEWS RR 0  MEWS LOC 0  MEWS Score 2  MEWS Score Color Yellow  Assess: if the MEWS score is Yellow or Red  Were vital signs taken at a resting state? Yes  Focused Assessment No change from prior assessment  Does the patient meet 2 or more of the SIRS criteria? No  MEWS guidelines implemented *See Row Information* Yes  Treat  MEWS Interventions Escalated (See documentation below)  Pain Scale 0-10  Pain Score 10  Pain Type Acute pain;Chronic pain  Pain Location Generalized (back, feet, legs, headache, chest)  Pain Descriptors / Indicators Aching  Pain Frequency Constant  Pain Onset On-going  Pain Intervention(s) Medication (See eMAR)  Take Vital Signs  Increase Vital Sign Frequency  Yellow: Q 2hr X 2 then Q 4hr X 2, if remains yellow, continue Q 4hrs  Escalate  MEWS: Escalate Yellow: discuss with charge nurse/RN and consider discussing with provider and RRT  Notify: Charge Nurse/RN  Name of Charge Nurse/RN Notified Meredith RN  Date Charge Nurse/RN Notified 08/16/21  Time Charge Nurse/RN Notified 1610  Notify: Provider  Provider Name/Title Garner Nash NP  Date Provider Notified 08/16/21  Time Provider Notified [phone]  Notification Type Page  Notification Reason Other (Comment) (fever)  Provider response See new orders  Date of Provider Response 08/16/21  Time of Provider Response [phone]  Assess: SIRS CRITERIA  SIRS Temperature  1  SIRS Pulse 1  SIRS Respirations  0  SIRS WBC 0  SIRS Score Sum  2  Patient is alert and oriented x4. Vital signs per yellow Mews protocol. Patient placed on 2L/[place] to maintain oxygen sats above 92%. No apparent distress.

## 2021-08-16 NOTE — Progress Notes (Signed)
Subjective: Kara [name] is a 35 year old female with a medical history significant for sickle cell disease, chronic pain syndrome, opiate dependence and tolerance, and history of anemia of chronic disease was admitted for acute chest syndrome in the setting of sickle cell pain crisis. Patient continues to complain of allover body pain.  She rates her pain as 8/10.  Patient febrile overnight with a maximum temperature of 102.1.  She denies any headache, chest pain, shortness of breath, dizziness, urinary symptoms, nausea, vomiting, or diarrhea.  Objective:  Vital signs in last 24 hours:  Vitals:   08/16/21 0929 08/16/21 1244 08/16/21 1257 08/16/21 1620  BP: (!) 111/59  102/61   Pulse: 92  73   Resp: 14 13 14 16   Temp: 99.5 F (37.5 C)  98.7 F (37.1 C)   TempSrc: Oral  Oral   SpO2: 97% 96% 98% 100%  Weight:      Height:        Intake/Output from previous day:   Intake/Output Summary (Last 24 hours) at 08/16/2021 1650 Last data filed at 08/16/2021 1300 Gross per 24 hour  Intake 2281.8 ml  Output --  Net 2281.8 ml    Physical Exam: General: Alert, awake, oriented x3, in no acute distress.  HEENT: [place]/AT PEERL, EOMI Neck: Trachea midline,  no masses, no thyromegal,y no JVD, no carotid bruit OROPHARYNX:  Moist, No exudate/ erythema/lesions.  Heart: Regular rate and rhythm, without murmurs, rubs, gallops, PMI non-displaced, no heaves or thrills on palpation.  Lungs: Clear to auscultation, no wheezing or rhonchi noted. No increased vocal fremitus resonant to percussion  Abdomen: Soft, nontender, nondistended, positive bowel sounds, no masses no hepatosplenomegaly noted..  Neuro: No focal neurological deficits noted cranial nerves II through XII grossly intact. DTRs 2+ bilaterally upper and lower extremities. Strength 5 out of 5 in bilateral upper and lower extremities. Musculoskeletal: No warm swelling or erythema around joints, no spinal tenderness noted. Psychiatric: Patient  alert and oriented x3, good insight and cognition, good recent to remote recall. Lymph node survey: No cervical axillary or inguinal lymphadenopathy noted.  Lab Results:  Basic Metabolic Panel:    Component Value Date/Time   NA 138 08/15/2021 0520   NA 138 07/14/2019 1128   K 2.8 (L) 08/15/2021 0520   CL 106 08/15/2021 0520   CO2 23 08/15/2021 0520   BUN 6 08/15/2021 0520   BUN 7 07/14/2019 1128   CREATININE 0.45 08/15/2021 0520   GLUCOSE 119 (H) 08/15/2021 0520   CALCIUM 9.1 08/15/2021 0520   CBC:    Component Value Date/Time   WBC 23.7 (H) 08/15/2021 0520   HGB 8.2 (L) 08/15/2021 0520   HGB 9.1 (L) 07/14/2019 1128   HCT 23.2 (L) 08/15/2021 0520   HCT 27.8 (L) 07/14/2019 1128   PLT 353 08/15/2021 0520   PLT 435 07/14/2019 1128   MCV 97.5 08/15/2021 0520   MCV 103 (H) 07/14/2019 1128   NEUTROABS 14.8 (H) 08/15/2021 0520   NEUTROABS 5.0 07/14/2019 1128   LYMPHSABS 5.9 (H) 08/15/2021 0520   LYMPHSABS 7.3 (H) 07/14/2019 1128   MONOABS 2.3 (H) 08/15/2021 0520   EOSABS 0.1 08/15/2021 0520   EOSABS 0.3 07/14/2019 1128   BASOSABS 0.1 08/15/2021 0520   BASOSABS 0.1 07/14/2019 1128    Recent Results (from the past 240 hour(s))  Culture, blood (routine x 2)     Status: None (Preliminary result)   Collection Time: 08/15/21  6:21 AM   Specimen: BLOOD  Result Value Ref Range Status  Specimen Description   Final    BLOOD BLOOD LEFT FOREARM Performed at [facility], [address], [place], [place] [address]    Special Requests   Final    BOTTLES DRAWN AEROBIC AND ANAEROBIC Blood Culture adequate volume Performed at [facility], [address], [place], [place] [address]    Culture   Final    NO GROWTH 1 DAY Performed at [facility], [address] [place], [place] [address]    Report Status PENDING  Incomplete  Culture, blood (routine x 2)     Status: None (Preliminary result)   Collection Time: 08/15/21  6:35 AM    Specimen: BLOOD  Result Value Ref Range Status   Specimen Description   Final    BLOOD BLOOD RIGHT FOREARM Performed at [facility], [address], [place], [place] [address]    Special Requests   Final    BOTTLES DRAWN AEROBIC AND ANAEROBIC Blood Culture adequate volume Performed at [facility], [address], [place], [place] [address]    Culture   Final    NO GROWTH 1 DAY Performed at [facility], [address], [place], [place] [address]    Report Status PENDING  Incomplete  Resp Panel by RT-PCR (Flu A&B, Covid) Nasopharyngeal Swab     Status: None   Collection Time: 08/15/21  6:42 AM   Specimen: Nasopharyngeal Swab; Nasopharyngeal(NP) swabs in vial transport medium  Result Value Ref Range Status   SARS Coronavirus 2 by RT PCR NEGATIVE NEGATIVE Final    Comment: (NOTE) SARS-CoV-2 target nucleic acids are NOT DETECTED.  The SARS-CoV-2 RNA is generally detectable in upper respiratory specimens during the acute phase of infection. The lowest concentration of SARS-CoV-2 viral copies this assay can detect is 138 copies/mL. A negative result does not preclude SARS-Cov-2 infection and should not be used as the sole basis for treatment or other patient management decisions. A negative result may occur with  improper specimen collection/handling, submission of specimen other than nasopharyngeal swab, presence of viral mutation(s) within the areas targeted by this assay, and inadequate number of viral copies(<138 copies/mL). A negative result must be combined with clinical observations, patient history, and epidemiological information. The expected result is Negative.  Fact Sheet for Patients:  BloggerCourse.com  Fact Sheet for Healthcare Providers:  SeriousBroker.it  This test is no t yet approved or cleared by the [place] FDA and  has been authorized for detection and/or  diagnosis of SARS-CoV-2 by FDA under an Emergency Use Authorization (EUA). This EUA will remain  in effect (meaning this test can be used) for the duration of the COVID-19 declaration under Section 564(b)(1) of the Act, 21 U.S.C.section 360bbb-3(b)(1), unless the authorization is terminated  or revoked sooner.       Influenza A by PCR NEGATIVE NEGATIVE Final   Influenza B by PCR NEGATIVE NEGATIVE Final    Comment: (NOTE) The Xpert Xpress SARS-CoV-2/FLU/RSV plus assay is intended as an aid in the diagnosis of influenza from Nasopharyngeal swab specimens and should not be used as a sole basis for treatment. Nasal washings and aspirates are unacceptable for Xpert Xpress SARS-CoV-2/FLU/RSV testing.  Fact Sheet for Patients: BloggerCourse.com  Fact Sheet for Healthcare Providers: SeriousBroker.it  This test is not yet approved or cleared by the [place] FDA and has been authorized for detection and/or diagnosis of SARS-CoV-2 by FDA under an Emergency Use Authorization (EUA). This EUA will remain  in effect (meaning this test can be used) for the duration of the COVID-19 declaration under Section 564(b)(1) of the Act, 21 U.S.C. section 360bbb-3(b)(1), unless the authorization is terminated or revoked.  Performed at [facility], [address] [place], [place] [address]   MRSA Next Gen by PCR, Nasal     Status: None   Collection Time: 08/15/21 12:07 PM   Specimen: Nasal Mucosa; Nasal Swab  Result Value Ref Range Status   MRSA by PCR Next Gen NOT DETECTED NOT DETECTED Final    Comment: (NOTE) The GeneXpert MRSA Assay (FDA approved for NASAL specimens only), is one component of a comprehensive MRSA colonization surveillance program. It is not intended to diagnose MRSA infection nor to guide or monitor treatment for MRSA infections. Test performance is not FDA approved in patients less than 103  years old. Performed at [facility], [address], [place], [place] [address]     Studies/Results: DG Chest Port 1 View  Result Date: 08/15/2021 CLINICAL DATA:  35 year old female with chest pain, sickle cell disease. Neck back and extremity pain. EXAM: PORTABLE CHEST 1 VIEW COMPARISON:  Portable chest 06/21/2021 and earlier. FINDINGS: Portable AP semi upright view at 0614 hours. Lower lung volumes with accentuation of cardiac size, mild underlying cardiomegaly. Other mediastinal contours are within normal limits. Visualized tracheal air column is within normal limits. No pneumothorax or pulmonary edema. But there is confluent new patchy opacity at the right lung base. Minimal increased markings at the left lung base. Stable visualized osseous structures. Paucity of bowel gas in the upper abdomen. Incidental nipple piercings. IMPRESSION: 1. Confluent new right lung base opacity compatible with Acute Chest Syndrome in this setting. 2. Lower lung volumes. Minor left lung base atelectasis suspected. Stable mild cardiomegaly. Electronically Signed   By: Odessa Fleming M.D.   On: 08/15/2021 06:19    Medications: Scheduled Meds:  enoxaparin (LOVENOX) injection  40 mg Subcutaneous Q24H   folic acid  1 mg Oral Daily   gabapentin  300 mg Oral TID   HYDROmorphone   Intravenous Q4H   ketorolac  15 mg Intravenous Q6H   loratadine  10 mg Oral Daily   oxyCODONE  15 mg Oral Q12H   senna-docusate  1 tablet Oral BID   Continuous Infusions:  sodium chloride 50 mL/hr at 08/16/21 1038   ceFEPime (MAXIPIME) IV 2 g (08/16/21 1344)   PRN Meds:.acetaminophen, diphenhydrAMINE, naloxone **AND** sodium chloride flush, ondansetron (ZOFRAN) IV, oxyCODONE, polyethylene glycol  Consultants: None  Procedures: None  Antibiotics: IV Cefepime  Assessment/Plan: Principal Problem:   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  Patient febrile overnight with a maximum temperature of 102.1. Oxygen saturation 96% on 2 L. Continue IV antibiotics. Incentive spirometer Tylenol 650 mg every 4 hours as needed for fever  Sickle cell disease with pain crisis: Continue IV Dilaudid PCA without any changes in settings today Decrease IV fluids to KVO Toradol 15 mg IV every 6 hours for total of 5 days Monitor vital signs very closely, reevaluate pain scale regularly, and supplemental oxygen as needed  Chronic pain syndrome: Continue home medications  Leukocytosis: Stable.  Improving.  Blood cultures negative.  Lactic acid negative.  Continue to monitor closely.  Repeat CBC in AM.  Anemia of chronic disease: Hemoglobin remained stable and consistent with patient's baseline.  There is no clinical indication for blood transfusion at this time.  Continue to monitor closely.  History of generalized anxiety: Stable.  Patient is not on any medications at home.  She is not followed by psychiatry as an outpatient.  She currently denies any suicidal or homicidal intentions.   Code Status: Full Code Family Communication: N/A Disposition Plan: Not yet ready for discharge  Reola Buckles [name]  APRN, MSN, FNP-C [facility] [address] [place], [place] [address] [phone]  If 5PM-8AM, please contact night-coverage.  08/16/2021, 4:51 PM  LOS: 1 day

## 2021-08-17 DIAGNOSIS — D5701 Hb-SS disease with acute chest syndrome: Secondary | ICD-10-CM | POA: Diagnosis not present

## 2021-08-17 LAB — URINALYSIS, ROUTINE W REFLEX MICROSCOPIC
Bilirubin Urine: NEGATIVE
Glucose, UA: NEGATIVE mg/dL
Hgb urine dipstick: NEGATIVE
Ketones, ur: NEGATIVE mg/dL
Nitrite: NEGATIVE
Protein, ur: NEGATIVE mg/dL
Specific Gravity, Urine: 1.015 (ref 1.005–1.030)
pH: 6 (ref 5.0–8.0)

## 2021-08-17 LAB — CBC
HCT: 18.1 % — ABNORMAL LOW (ref 36.0–46.0)
Hemoglobin: 6.3 g/dL — CL (ref 12.0–15.0)
MCH: 33.9 pg (ref 26.0–34.0)
MCHC: 34.8 g/dL (ref 30.0–36.0)
MCV: 97.3 fL (ref 80.0–100.0)
Platelets: 308 10*3/uL (ref 150–400)
RBC: 1.86 MIL/uL — ABNORMAL LOW (ref 3.87–5.11)
RDW: 24.2 % — ABNORMAL HIGH (ref 11.5–15.5)
WBC: 13.9 10*3/uL — ABNORMAL HIGH (ref 4.0–10.5)
nRBC: 6.9 % — ABNORMAL HIGH (ref 0.0–0.2)

## 2021-08-17 LAB — BASIC METABOLIC PANEL
Anion gap: 6 (ref 5–15)
BUN: 5 mg/dL — ABNORMAL LOW (ref 6–20)
CO2: 24 mmol/L (ref 22–32)
Calcium: 9 mg/dL (ref 8.9–10.3)
Chloride: 112 mmol/L — ABNORMAL HIGH (ref 98–111)
Creatinine, Ser: 0.5 mg/dL (ref 0.44–1.00)
GFR, Estimated: >60 mL/min (ref >60)
Glucose, Bld: 93 mg/dL (ref 70–99)
Potassium: 3.8 mmol/L (ref 3.5–5.1)
Sodium: 142 mmol/L (ref 135–145)

## 2021-08-17 LAB — HEMOGLOBIN AND HEMATOCRIT, BLOOD
HCT: 20.7 % — ABNORMAL LOW (ref 36.0–46.0)
Hemoglobin: 7.3 g/dL — ABNORMAL LOW (ref 12.0–15.0)

## 2021-08-17 LAB — PREPARE RBC (CROSSMATCH)

## 2021-08-17 MED ORDER — SODIUM CHLORIDE 0.9% IV SOLUTION
Freq: Once | INTRAVENOUS | Status: AC
Start: 2021-08-17 — End: 2021-08-17

## 2021-08-17 NOTE — Plan of Care (Signed)

## 2021-08-17 NOTE — Progress Notes (Signed)
Subjective: Kara [name] is a 35 year old female with a medical history significant for sickle cell disease, chronic pain syndrome, opiate dependence and tolerance, and history of anemia of chronic disease was admitted for acute chest syndrome in the setting of sickle cell pain crisis.  Today, patient's hemoglobin is 6.3, which is below her baseline.  Denies any dizziness, chest pain, shortness of breath, headache, urinary symptoms, nausea, vomiting, or diarrhea.  Patient has been afebrile overnight and oxygen saturation is around 91% on 2 L.  Objective:  Vital signs in last 24 hours:  Vitals:   08/17/21 1418 08/17/21 1504 08/17/21 1514 08/17/21 1521  BP: (!) 120/58 119/68  110/70  Pulse: 81 93  86  Resp: 18 16 16 16   Temp: 98.7 F (37.1 C) 98.4 F (36.9 C)  98.6 F (37 C)  TempSrc: Oral Oral    SpO2: 96% 91% 91%   Weight:      Height:        Intake/Output from previous day:  No intake or output data in the 24 hours ending 08/17/21 1535  Physical Exam: General: Alert, awake, oriented x3, in no acute distress.  HEENT: [place]/AT PEERL, EOMI Neck: Trachea midline,  no masses, no thyromegal,y no JVD, no carotid bruit OROPHARYNX:  Moist, No exudate/ erythema/lesions.  Heart: Regular rate and rhythm, without murmurs, rubs, gallops, PMI non-displaced, no heaves or thrills on palpation.  Lungs: Clear to auscultation, no wheezing or rhonchi noted. No increased vocal fremitus resonant to percussion  Abdomen: Soft, nontender, nondistended, positive bowel sounds, no masses no hepatosplenomegaly noted..  Neuro: No focal neurological deficits noted cranial nerves II through XII grossly intact. DTRs 2+ bilaterally upper and lower extremities. Strength 5 out of 5 in bilateral upper and lower extremities. Musculoskeletal: No warm swelling or erythema around joints, no spinal tenderness noted. Psychiatric: Patient alert and oriented x3, good insight and cognition, good recent to remote  recall. Lymph node survey: No cervical axillary or inguinal lymphadenopathy noted.  Lab Results:  Basic Metabolic Panel:    Component Value Date/Time   NA 142 08/17/2021 0608   NA 138 07/14/2019 1128   K 3.8 08/17/2021 0608   CL 112 (H) 08/17/2021 0608   CO2 24 08/17/2021 0608   BUN 5 (L) 08/17/2021 0608   BUN 7 07/14/2019 1128   CREATININE 0.50 08/17/2021 0608   GLUCOSE 93 08/17/2021 0608   CALCIUM 9.0 08/17/2021 0608   CBC:    Component Value Date/Time   WBC 13.9 (H) 08/17/2021 0608   HGB 6.3 (LL) 08/17/2021 0608   HGB 9.1 (L) 07/14/2019 1128   HCT 18.1 (L) 08/17/2021 0608   HCT 27.8 (L) 07/14/2019 1128   PLT 308 08/17/2021 0608   PLT 435 07/14/2019 1128   MCV 97.3 08/17/2021 0608   MCV 103 (H) 07/14/2019 1128   NEUTROABS 14.8 (H) 08/15/2021 0520   NEUTROABS 5.0 07/14/2019 1128   LYMPHSABS 5.9 (H) 08/15/2021 0520   LYMPHSABS 7.3 (H) 07/14/2019 1128   MONOABS 2.3 (H) 08/15/2021 0520   EOSABS 0.1 08/15/2021 0520   EOSABS 0.3 07/14/2019 1128   BASOSABS 0.1 08/15/2021 0520   BASOSABS 0.1 07/14/2019 1128    Recent Results (from the past 240 hour(s))  Culture, blood (routine x 2)     Status: None (Preliminary result)   Collection Time: 08/15/21  6:21 AM   Specimen: BLOOD  Result Value Ref Range Status   Specimen Description   Final    BLOOD BLOOD LEFT FOREARM Performed  at [facility], [address], [place], [place] [address]    Special Requests   Final    BOTTLES DRAWN AEROBIC AND ANAEROBIC Blood Culture adequate volume Performed at [facility], [address] [place], [place] [address]    Culture   Final    NO GROWTH 2 DAYS Performed at [facility], [address], [place], [place] [address]    Report Status PENDING  Incomplete  Culture, blood (routine x 2)     Status: None (Preliminary result)   Collection Time: 08/15/21  6:35 AM   Specimen: BLOOD  Result Value Ref Range Status   Specimen Description    Final    BLOOD BLOOD RIGHT FOREARM Performed at [facility], [address] [place], [place] [address]    Special Requests   Final    BOTTLES DRAWN AEROBIC AND ANAEROBIC Blood Culture adequate volume Performed at [facility], [address], [place], [place] [address]    Culture   Final    NO GROWTH 2 DAYS Performed at [facility], [address] [place], [place] [address]    Report Status PENDING  Incomplete  Resp Panel by RT-PCR (Flu A&B, Covid) Nasopharyngeal Swab     Status: None   Collection Time: 08/15/21  6:42 AM   Specimen: Nasopharyngeal Swab; Nasopharyngeal(NP) swabs in vial transport medium  Result Value Ref Range Status   SARS Coronavirus 2 by RT PCR NEGATIVE NEGATIVE Final    Comment: (NOTE) SARS-CoV-2 target nucleic acids are NOT DETECTED.  The SARS-CoV-2 RNA is generally detectable in upper respiratory specimens during the acute phase of infection. The lowest concentration of SARS-CoV-2 viral copies this assay can detect is 138 copies/mL. A negative result does not preclude SARS-Cov-2 infection and should not be used as the sole basis for treatment or other patient management decisions. A negative result may occur with  improper specimen collection/handling, submission of specimen other than nasopharyngeal swab, presence of viral mutation(s) within the areas targeted by this assay, and inadequate number of viral copies(<138 copies/mL). A negative result must be combined with clinical observations, patient history, and epidemiological information. The expected result is Negative.  Fact Sheet for Patients:  BloggerCourse.com  Fact Sheet for Healthcare Providers:  SeriousBroker.it  This test is no t yet approved or cleared by the [place] FDA and  has been authorized for detection and/or diagnosis of SARS-CoV-2 by FDA under an Emergency Use Authorization (EUA).  This EUA will remain  in effect (meaning this test can be used) for the duration of the COVID-19 declaration under Section 564(b)(1) of the Act, 21 U.S.C.section 360bbb-3(b)(1), unless the authorization is terminated  or revoked sooner.       Influenza A by PCR NEGATIVE NEGATIVE Final   Influenza B by PCR NEGATIVE NEGATIVE Final    Comment: (NOTE) The Xpert Xpress SARS-CoV-2/FLU/RSV plus assay is intended as an aid in the diagnosis of influenza from Nasopharyngeal swab specimens and should not be used as a sole basis for treatment. Nasal washings and aspirates are unacceptable for Xpert Xpress SARS-CoV-2/FLU/RSV testing.  Fact Sheet for Patients: BloggerCourse.com  Fact Sheet for Healthcare Providers: SeriousBroker.it  This test is not yet approved or cleared by the [place] FDA and has been authorized for detection and/or diagnosis of SARS-CoV-2 by FDA under an Emergency Use Authorization (EUA). This EUA will remain in effect (meaning this test can be used) for the duration of the  COVID-19 declaration under Section 564(b)(1) of the Act, 21 U.S.C. section 360bbb-3(b)(1), unless the authorization is terminated or revoked.  Performed at [facility], [address], [place], [place] [address]   MRSA Next Gen by PCR, Nasal     Status: None   Collection Time: 08/15/21 12:07 PM   Specimen: Nasal Mucosa; Nasal Swab  Result Value Ref Range Status   MRSA by PCR Next Gen NOT DETECTED NOT DETECTED Final    Comment: (NOTE) The GeneXpert MRSA Assay (FDA approved for NASAL specimens only), is one component of a comprehensive MRSA colonization surveillance program. It is not intended to diagnose MRSA infection nor to guide or monitor treatment for MRSA infections. Test performance is not FDA approved in patients less than 60 years old. Performed at [facility], [address] [place], [place] [address]     Studies/Results: No results found.  Medications: Scheduled Meds:  enoxaparin (LOVENOX) injection  40 mg Subcutaneous Q24H   folic acid  1 mg Oral Daily   gabapentin  300 mg Oral TID   HYDROmorphone   Intravenous Q4H   ketorolac  15 mg Intravenous Q6H   loratadine  10 mg Oral Daily   oxyCODONE  15 mg Oral Q12H   senna-docusate  1 tablet Oral BID   Continuous Infusions:  sodium chloride 10 mL/hr at 08/17/21 0947   ceFEPime (MAXIPIME) IV 2 g (08/17/21 1523)   PRN Meds:.acetaminophen, diphenhydrAMINE, naloxone **AND** sodium chloride flush, ondansetron (ZOFRAN) IV, oxyCODONE, polyethylene glycol  Consultants: None  Procedures: None  Antibiotics: IV cefepime  Assessment/Plan: Principal Problem:   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  Continue supplemental oxygen Incentive spirometer Tylenol 650 mg every 4 hours as needed for fever  Sickle cell disease with pain crisis: Continue IV Dilaudid PCA without any changes in settings Decrease IV fluids to KVO Toradol 15 mg IV every 6 hours for total of 5 days Monitor vital signs very closely, reevaluate pain scale regularly, and supplemental oxygen as needed  Chronic pain syndrome: Continue home medications  Leukocytosis: Improving.  Blood cultures negative.  Monitor closely.  Continue IV antibiotics.  Follow CBC in AM.  Anemia of chronic disease: Patient's hemoglobin is 6.3 g/dL, which is below her baseline.  Transfuse 1 unit PRBCs reassess in AM.  History of generalized anxiety: Stable.  Patient is not currently on home medications.  She is not followed by psychiatry.  She denies any suicidal or homicidal ideations.   Code Status: Full Code Family Communication:  N/A Disposition Plan: Not yet ready for discharge   Yarely Bebee [name]  APRN, MSN, FNP-C [facility] [address] [place], [place] [address] [phone]  If 5PM-8AM, please contact night-coverage.  08/17/2021, 3:35 PM  LOS: 2 days

## 2021-08-18 ENCOUNTER — Inpatient Hospital Stay (HOSPITAL_COMMUNITY): Payer: Medicare Other

## 2021-08-18 DIAGNOSIS — D5701 Hb-SS disease with acute chest syndrome: Secondary | ICD-10-CM | POA: Diagnosis not present

## 2021-08-18 DIAGNOSIS — E876 Hypokalemia: Secondary | ICD-10-CM

## 2021-08-18 LAB — HGB FRAC BY HPLC+SOLUBILITY
Hgb A: 6.3 % — ABNORMAL LOW (ref 96.4–98.8)
Hgb C: 0 %
Hgb E: 0 %
Hgb F: 10.9 % — ABNORMAL HIGH (ref 0.0–2.0)
Hgb S: 80.5 % — ABNORMAL HIGH
Hgb Solubility: POSITIVE — AB
Hgb Variant: 0 %

## 2021-08-18 LAB — TYPE AND SCREEN
ABO/RH(D): A POS
Antibody Screen: NEGATIVE
Donor AG Type: NEGATIVE
Unit division: 0

## 2021-08-18 LAB — BPAM RBC
Blood Product Expiration Date: 202210292359
ISSUE DATE / TIME: 202209281452
Unit Type and Rh: 6200

## 2021-08-18 LAB — CBC
HCT: 21.3 % — ABNORMAL LOW (ref 36.0–46.0)
Hemoglobin: 7.4 g/dL — ABNORMAL LOW (ref 12.0–15.0)
MCH: 33.2 pg (ref 26.0–34.0)
MCHC: 34.7 g/dL (ref 30.0–36.0)
MCV: 95.5 fL (ref 80.0–100.0)
Platelets: 319 10*3/uL (ref 150–400)
RBC: 2.23 MIL/uL — ABNORMAL LOW (ref 3.87–5.11)
RDW: 24.4 % — ABNORMAL HIGH (ref 11.5–15.5)
WBC: 12.9 10*3/uL — ABNORMAL HIGH (ref 4.0–10.5)
nRBC: 8.8 % — ABNORMAL HIGH (ref 0.0–0.2)

## 2021-08-18 LAB — HGB FRACTIONATION CASCADE: Hgb A2: 2.3 % (ref 1.8–3.2)

## 2021-08-18 LAB — URINE CULTURE: Culture: NO GROWTH

## 2021-08-18 MED ORDER — OXYCODONE HCL 5 MG PO TABS
15.0000 mg | ORAL_TABLET | ORAL | Status: DC
  Administered 2021-08-18 – 2021-08-20 (×8): 15 mg via ORAL
  Filled 2021-08-18 (×10): qty 3

## 2021-08-18 MED ORDER — AMOXICILLIN-POT CLAVULANATE 875-125 MG PO TABS
1.0000 | ORAL_TABLET | Freq: Two times a day (BID) | ORAL | Status: DC
  Administered 2021-08-18 – 2021-08-20 (×5): 1 via ORAL
  Filled 2021-08-18 (×5): qty 1

## 2021-08-18 MED ORDER — HYDROMORPHONE HCL 1 MG/ML IJ SOLN
1.0000 mg | INTRAMUSCULAR | Status: DC | PRN
  Administered 2021-08-19 (×2): 1 mg via INTRAVENOUS
  Filled 2021-08-18 (×2): qty 1

## 2021-08-18 NOTE — Progress Notes (Signed)
Patient refusing to wear end tidal Co2 capnography for safety with PCA pump. Patient familiarized with policy and importance- still refusing. Charge nurse and NP aware of situation. Will ask for involvement of Chiropodist when available.

## 2021-08-18 NOTE — Progress Notes (Signed)
Patient still refusing all medication including pain medication offered. Patient is agreeable to taking PO antibiotic. See MAR.

## 2021-08-18 NOTE — Progress Notes (Addendum)
Subjective: Kara [name] is a 35 year old female with a medical history significant for sickle cell disease, chronic pain syndrome, opiate dependence and tolerance and history of anemia of chronic disease that was admitted for sickle cell pain crisis.  Today, patient's hemoglobin has improved to 7.4.  She is s/p 1 unit PRBCs on yesterday.  Patient has not had a PCA available for pain over the past hour, patient refuses to wear safety devices and PCA has been held.  She says that she is not having much pain at this time.  She states, "I am too mad to think about my pain". Patient is not having any chest pain, shortness of breath, dizziness, nausea, vomiting, or diarrhea.  Her oxygen saturation is 98% on RA and patient is afebrile.  Objective:  Vital signs in last 24 hours:  Vitals:   08/18/21 0519 08/18/21 0818 08/18/21 1035 08/18/21 1036  BP: 118/64  117/76   Pulse: 84  88   Resp: 15 20 16    Temp: 98.7 F (37.1 C)  99.1 F (37.3 C)   TempSrc: Oral  Oral   SpO2: 91% 95% 98% 98%  Weight:      Height:        Intake/Output from previous day:   Intake/Output Summary (Last 24 hours) at 08/18/2021 1219 Last data filed at 08/18/2021 0301 Gross per 24 hour  Intake 1627.02 ml  Output --  Net 1627.02 ml    Physical Exam: General: Alert, awake, oriented x3, in no acute distress.  HEENT: [place]/AT PEERL, EOMI Neck: Trachea midline,  no masses, no thyromegal,y no JVD, no carotid bruit OROPHARYNX:  Moist, No exudate/ erythema/lesions.  Heart: Regular rate and rhythm, without murmurs, rubs, gallops, PMI non-displaced, no heaves or thrills on palpation.  Lungs: Clear to auscultation, no wheezing or rhonchi noted. No increased vocal fremitus resonant to percussion  Abdomen: Soft, nontender, nondistended, positive bowel sounds, no masses no hepatosplenomegaly noted..  Neuro: No focal neurological deficits noted cranial nerves II through XII grossly intact. DTRs 2+ bilaterally upper and lower  extremities. Strength 5 out of 5 in bilateral upper and lower extremities. Musculoskeletal: No warm swelling or erythema around joints, no spinal tenderness noted. Psychiatric: Patient alert and oriented x3, good insight and cognition, good recent to remote recall. Lymph node survey: No cervical axillary or inguinal lymphadenopathy noted.  Lab Results:  Basic Metabolic Panel:    Component Value Date/Time   NA 142 08/17/2021 0608   NA 138 07/14/2019 1128   K 3.8 08/17/2021 0608   CL 112 (H) 08/17/2021 0608   CO2 24 08/17/2021 0608   BUN 5 (L) 08/17/2021 0608   BUN 7 07/14/2019 1128   CREATININE 0.50 08/17/2021 0608   GLUCOSE 93 08/17/2021 0608   CALCIUM 9.0 08/17/2021 0608   CBC:    Component Value Date/Time   WBC 12.9 (H) 08/18/2021 0515   HGB 7.4 (L) 08/18/2021 0515   HGB 9.1 (L) 07/14/2019 1128   HCT 21.3 (L) 08/18/2021 0515   HCT 27.8 (L) 07/14/2019 1128   PLT 319 08/18/2021 0515   PLT 435 07/14/2019 1128   MCV 95.5 08/18/2021 0515   MCV 103 (H) 07/14/2019 1128   NEUTROABS 14.8 (H) 08/15/2021 0520   NEUTROABS 5.0 07/14/2019 1128   LYMPHSABS 5.9 (H) 08/15/2021 0520   LYMPHSABS 7.3 (H) 07/14/2019 1128   MONOABS 2.3 (H) 08/15/2021 0520   EOSABS 0.1 08/15/2021 0520   EOSABS 0.3 07/14/2019 1128   BASOSABS 0.1 08/15/2021 0520  BASOSABS 0.1 07/14/2019 1128    Recent Results (from the past 240 hour(s))  Culture, blood (routine x 2)     Status: None (Preliminary result)   Collection Time: 08/15/21  6:21 AM   Specimen: BLOOD  Result Value Ref Range Status   Specimen Description   Final    BLOOD BLOOD LEFT FOREARM Performed at [facility], [address], [place], [place] [address]    Special Requests   Final    BOTTLES DRAWN AEROBIC AND ANAEROBIC Blood Culture adequate volume Performed at [facility], [address], [place], [place] [address]    Culture   Final    NO GROWTH 2 DAYS Performed at [facility], [address], [place], [place] [address]    Report Status PENDING  Incomplete  Culture, blood (routine x 2)     Status: None (Preliminary result)   Collection Time: 08/15/21  6:35 AM   Specimen: BLOOD  Result Value Ref Range Status   Specimen Description   Final    BLOOD BLOOD RIGHT FOREARM Performed at [facility], [address], [place], [place] [address]    Special Requests   Final    BOTTLES DRAWN AEROBIC AND ANAEROBIC Blood Culture adequate volume Performed at [facility], [address], [place], [place] [address]    Culture   Final    NO GROWTH 2 DAYS Performed at [facility], [address] [place], [place] [address]    Report Status PENDING  Incomplete  Resp Panel by RT-PCR (Flu A&B, Covid) Nasopharyngeal Swab     Status: None   Collection Time: 08/15/21  6:42 AM   Specimen: Nasopharyngeal Swab; Nasopharyngeal(NP) swabs in vial transport medium  Result Value Ref Range Status   SARS Coronavirus 2 by RT PCR NEGATIVE NEGATIVE Final    Comment: (NOTE) SARS-CoV-2 target nucleic acids are NOT DETECTED.  The SARS-CoV-2 RNA is generally detectable in upper respiratory specimens during the acute phase of infection. The lowest concentration of SARS-CoV-2 viral copies this assay can detect is 138 copies/mL. A negative result does not preclude SARS-Cov-2 infection and should not be used as the sole basis for treatment or other patient management decisions. A negative result may occur with  improper specimen collection/handling, submission of specimen other than nasopharyngeal swab, presence of viral mutation(s) within the areas targeted by this assay, and inadequate number of viral copies(<138 copies/mL). A negative result must be combined with clinical observations, patient history, and epidemiological information. The expected result is Negative.  Fact Sheet for Patients:  BloggerCourse.com  Fact Sheet for  Healthcare Providers:  SeriousBroker.it  This test is no t yet approved or cleared by the [place] FDA and  has been authorized for detection and/or diagnosis of SARS-CoV-2 by FDA under an Emergency Use Authorization (EUA). This EUA will remain  in effect (meaning this test can be used) for the duration of the COVID-19 declaration under Section 564(b)(1) of the Act, 21 U.S.C.section 360bbb-3(b)(1), unless the authorization is terminated  or revoked sooner.       Influenza A by PCR NEGATIVE NEGATIVE Final   Influenza B by PCR NEGATIVE NEGATIVE Final    Comment: (NOTE) The Xpert Xpress SARS-CoV-2/FLU/RSV plus assay is intended as an aid in the diagnosis of influenza from Nasopharyngeal swab specimens and should not be used as a sole basis for treatment. Nasal washings and aspirates are unacceptable for Xpert Xpress SARS-CoV-2/FLU/RSV testing.  Fact Sheet for Patients: BloggerCourse.com  Fact Sheet for Healthcare Providers: SeriousBroker.it  This test is not yet approved or cleared by the [place] FDA and has been authorized for detection and/or diagnosis of SARS-CoV-2 by FDA under an Emergency Use Authorization (EUA). This EUA will remain in effect (meaning this test can be used) for the duration of the COVID-19 declaration under Section 564(b)(1) of the Act, 21 U.S.C. section 360bbb-3(b)(1), unless the authorization is terminated or revoked.  Performed at [facility], [address], [place], [place] [address]   MRSA Next Gen by PCR, Nasal     Status: None   Collection Time: 08/15/21 12:07 PM   Specimen: Nasal Mucosa; Nasal Swab  Result Value Ref Range Status   MRSA by PCR Next Gen NOT DETECTED NOT DETECTED Final    Comment: (NOTE) The GeneXpert MRSA Assay (FDA approved for NASAL specimens only), is one component of a comprehensive MRSA colonization  surveillance program. It is not intended to diagnose MRSA infection nor to guide or monitor treatment for MRSA infections. Test performance is not FDA approved in patients less than 11 years old. Performed at [facility], [address] [place], [place] [address]     Studies/Results: No results found.  Medications: Scheduled Meds:  amoxicillin-clavulanate  1 tablet Oral Q12H   enoxaparin (LOVENOX) injection  40 mg Subcutaneous Q24H   folic acid  1 mg Oral Daily   gabapentin  300 mg Oral TID   ketorolac  15 mg Intravenous Q6H   loratadine  10 mg Oral Daily   oxyCODONE  15 mg Oral Q4H while awake   oxyCODONE  15 mg Oral Q12H   senna-docusate  1 tablet Oral BID   Continuous Infusions:  sodium chloride 10 mL/hr at 08/18/21 0301   PRN Meds:.acetaminophen, HYDROmorphone (DILAUDID) injection, polyethylene glycol  Consultants: None  Procedures: None  Antibiotics: None  Assessment/Plan: Principal Problem:   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  Augmentin 875-125 mg every 12 hours Maintain oxygen saturation above 95%.  Oxygen saturation is 98% on RA. Continue incentive spirometer Tylenol 650 mg every 4 hours as needed for fever  Sickle cell disease with pain crisis: Discontinue IV Dilaudid PCA Continue home medications including oxycodone 15 mg every 4 hours while awake OxyContin 15 mg every 12 hours Toradol 15 mg every 6 hours for total of 5 days Monitor vital signs very closely, reevaluate pain scale regularly, and supplemental oxygen as needed  Chronic pain syndrome: Continue home medications  Leukocytosis: Improving.  Blood cultures negative.  Monitor closely.  Continue antibiotics.  Repeat CBC in AM.  Anemia of chronic  disease: Hemoglobin is 7.4 on today, patient is s/p 1 unit PRBCs on yesterday. Repeat CBC in a.m.  History of generalized anxiety: Stable.  Patient is not currently on medications.  She is now followed by psychiatry.  She denies any suicidal or homicidal intentions.  Hypokalemia:  Resolved. Continue to monitor closely  Code Status: Full Code Family Communication: N/A Disposition Plan: Not yet ready for discharge  Nolon Nations  APRN, MSN, FNP-C [facility] [address] [place], [place] [address] [phone]  If 5PM-8AM, please contact night-coverage.  08/18/2021, 12:19 PM  LOS: 3 days

## 2021-08-18 NOTE — Progress Notes (Signed)
PCA was stopped and removed by charge nurse.

## 2021-08-18 NOTE — Progress Notes (Signed)
Patient refusing all daily medications from this RN including pain medications. Telemetry box removed by patient and refusing to put back on. Central monitoring notified. Transport came to get patient for ordered chest x-ray and she refused to go down for imaging.

## 2021-08-19 DIAGNOSIS — D5701 Hb-SS disease with acute chest syndrome: Secondary | ICD-10-CM | POA: Diagnosis not present

## 2021-08-19 LAB — CBC WITH DIFFERENTIAL/PLATELET
Abs Immature Granulocytes: 0.08 10*3/uL — ABNORMAL HIGH (ref 0.00–0.07)
Basophils Absolute: 0.1 10*3/uL (ref 0.0–0.1)
Basophils Relative: 1 %
Eosinophils Absolute: 0.3 10*3/uL (ref 0.0–0.5)
Eosinophils Relative: 3 %
HCT: 20.7 % — ABNORMAL LOW (ref 36.0–46.0)
Hemoglobin: 7.2 g/dL — ABNORMAL LOW (ref 12.0–15.0)
Immature Granulocytes: 1 %
Lymphocytes Relative: 58 %
Lymphs Abs: 6.2 10*3/uL — ABNORMAL HIGH (ref 0.7–4.0)
MCH: 33.6 pg (ref 26.0–34.0)
MCHC: 34.8 g/dL (ref 30.0–36.0)
MCV: 96.7 fL (ref 80.0–100.0)
Monocytes Absolute: 1.1 10*3/uL — ABNORMAL HIGH (ref 0.1–1.0)
Monocytes Relative: 11 %
Neutro Abs: 2.7 10*3/uL (ref 1.7–7.7)
Neutrophils Relative %: 26 %
Platelets: 329 10*3/uL (ref 150–400)
RBC: 2.14 MIL/uL — ABNORMAL LOW (ref 3.87–5.11)
RDW: 25.7 % — ABNORMAL HIGH (ref 11.5–15.5)
WBC: 10.5 10*3/uL (ref 4.0–10.5)
nRBC: 13.3 % — ABNORMAL HIGH (ref 0.0–0.2)

## 2021-08-19 MED ORDER — NALOXONE HCL 0.4 MG/ML IJ SOLN: 0.4000 mg | INTRAMUSCULAR | Status: DC | PRN

## 2021-08-19 MED ORDER — GUAIFENESIN 100 MG/5ML PO SOLN: 5.0000 mL | ORAL | Status: DC | PRN

## 2021-08-19 MED ORDER — ONDANSETRON HCL 4 MG/2ML IJ SOLN: 4.0000 mg | Freq: Four times a day (QID) | INTRAMUSCULAR | Status: DC | PRN

## 2021-08-19 MED ORDER — DIPHENHYDRAMINE HCL 25 MG PO CAPS: 25.0000 mg | ORAL_CAPSULE | ORAL | Status: DC | PRN

## 2021-08-19 MED ORDER — SODIUM CHLORIDE 0.9% FLUSH: 9.0000 mL | INTRAVENOUS | Status: DC | PRN

## 2021-08-19 MED ORDER — HYDROMORPHONE 1 MG/ML IV SOLN
INTRAVENOUS | Status: DC
  Administered 2021-08-19: 1.5 mg via INTRAVENOUS
  Administered 2021-08-19: 5 mg via INTRAVENOUS
  Administered 2021-08-19: 30 mg via INTRAVENOUS
  Administered 2021-08-20: 1.5 mg via INTRAVENOUS
  Administered 2021-08-20: 3 mg via INTRAVENOUS
  Administered 2021-08-20: 0.5 mg via INTRAVENOUS
  Filled 2021-08-19: qty 30

## 2021-08-19 NOTE — Progress Notes (Signed)
Subjective: Patient has no new complaints on today.  She continues to complain of allover body pain.  She rates her pain as 8/10.  She denies any headache, shortness of breath, chest pain, urinary symptoms, nausea, vomiting, or diarrhea.  Patient states that she cannot breathe, current oxygen saturation is 98% on RA.  Objective:  Vital signs in last 24 hours:  Vitals:   08/18/21 1752 08/19/21 0037 08/19/21 0431 08/19/21 1028  BP: 121/81 127/82 112/61 111/68  Pulse: 79 69 63 78  Resp: 16 16 17 18   Temp: 100.2 F (37.9 C) 99 F (37.2 C) 98.7 F (37.1 C) 98.8 F (37.1 C)  TempSrc: Oral Oral Oral Oral  SpO2: 96% 99% 98% 96%  Weight:      Height:        Intake/Output from previous day:  No intake or output data in the 24 hours ending 08/19/21 1229  Physical Exam: General: Alert, awake, oriented x3, in no acute distress.  HEENT: [place]/AT PEERL, EOMI Neck: Trachea midline,  no masses, no thyromegal,y no JVD, no carotid bruit OROPHARYNX:  Moist, No exudate/ erythema/lesions.  Heart: Regular rate and rhythm, without murmurs, rubs, gallops, PMI non-displaced, no heaves or thrills on palpation.  Lungs: Clear to auscultation, no wheezing or rhonchi noted. No increased vocal fremitus resonant to percussion  Abdomen: Soft, nontender, nondistended, positive bowel sounds, no masses no hepatosplenomegaly noted..  Neuro: No focal neurological deficits noted cranial nerves II through XII grossly intact. DTRs 2+ bilaterally upper and lower extremities. Strength 5 out of 5 in bilateral upper and lower extremities. Musculoskeletal: No warm swelling or erythema around joints, no spinal tenderness noted. Psychiatric: Patient alert and oriented x3, good insight and cognition, good recent to remote recall. Lymph node survey: No cervical axillary or inguinal lymphadenopathy noted.  Lab Results:  Basic Metabolic Panel:    Component Value Date/Time   NA 142 08/17/2021 0608   NA 138 07/14/2019 1128   K  3.8 08/17/2021 0608   CL 112 (H) 08/17/2021 0608   CO2 24 08/17/2021 0608   BUN 5 (L) 08/17/2021 0608   BUN 7 07/14/2019 1128   CREATININE 0.50 08/17/2021 0608   GLUCOSE 93 08/17/2021 0608   CALCIUM 9.0 08/17/2021 0608   CBC:    Component Value Date/Time   WBC 10.5 08/19/2021 0549   HGB 7.2 (L) 08/19/2021 0549   HGB 9.1 (L) 07/14/2019 1128   HCT 20.7 (L) 08/19/2021 0549   HCT 27.8 (L) 07/14/2019 1128   PLT 329 08/19/2021 0549   PLT 435 07/14/2019 1128   MCV 96.7 08/19/2021 0549   MCV 103 (H) 07/14/2019 1128   NEUTROABS 2.7 08/19/2021 0549   NEUTROABS 5.0 07/14/2019 1128   LYMPHSABS 6.2 (H) 08/19/2021 0549   LYMPHSABS 7.3 (H) 07/14/2019 1128   MONOABS 1.1 (H) 08/19/2021 0549   EOSABS 0.3 08/19/2021 0549   EOSABS 0.3 07/14/2019 1128   BASOSABS 0.1 08/19/2021 0549   BASOSABS 0.1 07/14/2019 1128    Recent Results (from the past 240 hour(s))  Culture, blood (routine x 2)     Status: None (Preliminary result)   Collection Time: 08/15/21  6:21 AM   Specimen: BLOOD  Result Value Ref Range Status   Specimen Description   Final    BLOOD BLOOD LEFT FOREARM Performed at [facility], [address], [place], [place]    Special Requests   Final    BOTTLES DRAWN AEROBIC AND ANAEROBIC Blood Culture adequate volume Performed at  [facility], [address] [place], [place] [address]    Culture   Final    NO GROWTH 4 DAYS Performed at [facility], [address], [place], [place] [address]    Report Status PENDING  Incomplete  Culture, blood (routine x 2)     Status: None (Preliminary result)   Collection Time: 08/15/21  6:35 AM   Specimen: BLOOD  Result Value Ref Range Status   Specimen Description   Final    BLOOD BLOOD RIGHT FOREARM Performed at [facility], [address], [place], [place] [address]    Special Requests   Final    BOTTLES DRAWN AEROBIC AND ANAEROBIC Blood Culture adequate  volume Performed at [facility], [address] [place], [place] [address]    Culture   Final    NO GROWTH 4 DAYS Performed at [facility], [address], [place], [place] [address]    Report Status PENDING  Incomplete  Resp Panel by RT-PCR (Flu A&B, Covid) Nasopharyngeal Swab     Status: None   Collection Time: 08/15/21  6:42 AM   Specimen: Nasopharyngeal Swab; Nasopharyngeal(NP) swabs in vial transport medium  Result Value Ref Range Status   SARS Coronavirus 2 by RT PCR NEGATIVE NEGATIVE Final    Comment: (NOTE) SARS-CoV-2 target nucleic acids are NOT DETECTED.  The SARS-CoV-2 RNA is generally detectable in upper respiratory specimens during the acute phase of infection. The lowest concentration of SARS-CoV-2 viral copies this assay can detect is 138 copies/mL. A negative result does not preclude SARS-Cov-2 infection and should not be used as the sole basis for treatment or other patient management decisions. A negative result may occur with  improper specimen collection/handling, submission of specimen other than nasopharyngeal swab, presence of viral mutation(s) within the areas targeted by this assay, and inadequate number of viral copies(<138 copies/mL). A negative result must be combined with clinical observations, patient history, and epidemiological information. The expected result is Negative.  Fact Sheet for Patients:  BloggerCourse.com  Fact Sheet for Healthcare Providers:  SeriousBroker.it  This test is no t yet approved or cleared by the [place] FDA and  has been authorized for detection and/or diagnosis of SARS-CoV-2 by FDA under an Emergency Use Authorization (EUA). This EUA will remain  in effect (meaning this test can be used) for the duration of the COVID-19 declaration under Section 564(b)(1) of the Act, 21 U.S.C.section 360bbb-3(b)(1), unless the authorization is terminated   or revoked sooner.       Influenza A by PCR NEGATIVE NEGATIVE Final   Influenza B by PCR NEGATIVE NEGATIVE Final    Comment: (NOTE) The Xpert Xpress SARS-CoV-2/FLU/RSV plus assay is intended as an aid in the diagnosis of influenza from Nasopharyngeal swab specimens and should not be used as a sole basis for treatment. Nasal washings and aspirates are unacceptable for Xpert Xpress SARS-CoV-2/FLU/RSV testing.  Fact Sheet for Patients: BloggerCourse.com  Fact Sheet for Healthcare Providers: SeriousBroker.it  This test is not yet approved or cleared by the [place] FDA and has been authorized for detection and/or diagnosis of SARS-CoV-2 by FDA under an Emergency Use Authorization (EUA). This EUA will remain in effect (meaning this test can be used) for the duration of the COVID-19 declaration under Section 564(b)(1) of the Act, 21 U.S.C. section 360bbb-3(b)(1), unless the authorization is terminated or revoked.  Performed at [facility], [address] [place], [place] [address]  MRSA Next Gen by PCR, Nasal     Status: None   Collection Time: 08/15/21 12:07 PM   Specimen: Nasal Mucosa; Nasal Swab  Result Value Ref Range Status   MRSA by PCR Next Gen NOT DETECTED NOT DETECTED Final    Comment: (NOTE) The GeneXpert MRSA Assay (FDA approved for NASAL specimens only), is one component of a comprehensive MRSA colonization surveillance program. It is not intended to diagnose MRSA infection nor to guide or monitor treatment for MRSA infections. Test performance is not FDA approved in patients less than 17 years old. Performed at [facility], [address] [place], [place] [address]   Urine Culture     Status: None   Collection Time: 08/17/21  2:44 PM   Specimen: Urine, Clean Catch  Result Value Ref Range Status   Specimen Description   Final    URINE, CLEAN CATCH Performed at  [facility], [address] [place], [place] [address]    Special Requests   Final    NONE Performed at [facility], [address] [place], [place] [address]    Culture   Final    NO GROWTH Performed at [facility], [address] [place], [place] [address]    Report Status 08/18/2021 FINAL  Final    Studies/Results: No results found.  Medications: Scheduled Meds:  amoxicillin-clavulanate  1 tablet Oral Q12H   enoxaparin (LOVENOX) injection  40 mg Subcutaneous Q24H   folic acid  1 mg Oral Daily   gabapentin  300 mg Oral TID   HYDROmorphone   Intravenous Q4H   ketorolac  15 mg Intravenous Q6H   loratadine  10 mg Oral Daily   oxyCODONE  15 mg Oral Q4H while awake   oxyCODONE  15 mg Oral Q12H   senna-docusate  1 tablet Oral BID   Continuous Infusions:  sodium chloride 10 mL/hr at 08/18/21 0301   PRN Meds:.acetaminophen, diphenhydrAMINE, guaiFENesin, naloxone **AND** sodium chloride flush, ondansetron (ZOFRAN) IV, polyethylene glycol  Consultants: None  Procedures: None  Antibiotics: None  Assessment/Plan: Principal Problem:   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  Maintain oxygen saturation above 95%.  Continue incentive spirometer. Tylenol 650 mg every 4 hours as needed for fever.  Sickle cell disease with pain crisis: Initiate IV Dilaudid PCA Continue home medications  Chronic pain syndrome: Continue home medications  Leukocytosis: Resolved.  Blood cultures negative.  Monitor closely.  Continue antibiotics.  CBC in AM.  Anemia of chronic disease: Hemoglobin is stable and consistent with previous.  Patient is s/p 1 unit PRBCs.  Continue to follow closely.  History of generalized anxiety: Stable.  Patient is not on  medications.  She is not followed by psychiatry at this time.  She denies any suicidal homicidal intentions.  Hypokalemia: Resolved.  Continue to monitor closely. Code Status: Full Code Family Communication: N/A Disposition Plan: Not yet ready for discharge  Gaige Fussner [name]  APRN, MSN, FNP-C [facility] [address] [place], [place] [address] [phone]  If 5 mild PM-7AM, please contact night-coverage.  08/19/2021, 12:29 PM  LOS: 4 days

## 2021-08-19 NOTE — Care Management Important Message (Signed)
Medicare IM printed for Social Work at [facility] to give to the patient 

## 2021-08-20 DIAGNOSIS — D5701 Hb-SS disease with acute chest syndrome: Secondary | ICD-10-CM | POA: Diagnosis not present

## 2021-08-20 LAB — CULTURE, BLOOD (ROUTINE X 2)
Culture: NO GROWTH
Culture: NO GROWTH
Special Requests: ADEQUATE
Special Requests: ADEQUATE

## 2021-08-20 MED ORDER — AMOXICILLIN-POT CLAVULANATE 875-125 MG PO TABS: 1.0000 | ORAL_TABLET | Freq: Two times a day (BID) | ORAL | 0 refills | Status: AC

## 2021-08-20 NOTE — Progress Notes (Signed)
This RN called patient number in chart, received automatic voicemail x2, message left for patient to return phone call to unit concerning possible IV's still in place at discharge. Patient left hospital without being officially discharged by nurse. She did not receive discharge papers from her primary nurse and did not alert anyone that she was leaving.

## 2021-08-20 NOTE — Discharge Summary (Signed)
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   Specialty: Family Medicine Contact information: [address]. [place] [address] [phone]                Pt is discharged home in good condition and is to follow up with [facility], Sickle Cell, MD this week to have labs evaluated. Kara [name] is instructed to increase activity slowly and balance with rest for the next few days, and use prescribed medication to complete treatment of pain  Diet: Regular Wt Readings from Last 3 Encounters:  08/15/21 59.2 kg  06/22/21 58.2 kg  05/18/21 55.8 kg    History of present illness:  Kara [name]  is a 35 y.o. female with a medical history significant for sickle cell disease, chronic pain syndrome, opiate dependence and tolerance, and history of anemia of chronic disease presented to the emergency department with allover body pain and shortness of breath over the past several days.  Patient states that pain intensity has been increased and unrelieved by her home medications.  Home medications include OxyContin and oxycodone.  Patient last had these medications this a.m. without any sustained relief.  She attributes current pain crisis to changes in weather.  She endorses shortness of breath and chest pain.  She says that chest pain increases with inspiration.  She denies headache, dizziness, persistent cough, fever, chills.  No urinary symptoms, nausea, vomiting, or diarrhea.  She has had no sick contacts, recent  travel.  She was hospitalized with COVID-19 around 1 month ago.  Patient has not been vaccinated against COVID-19.     ER Course:  Vital signs show: BP 121/62   Pulse 84   Temp 99.3 F (37.4 C) (Oral)   Resp 14   SpO2 96% .  Complete blood count shows WBCs 23.7, hemoglobin 8.2, platelets 353,000.  LDH 324.  Absolute reticulocytes 367.  Quantitative hCG negative.  Starts COVID-19, and influenza A and B negative.  Sodium 138, potassium 2.8, chloride 106, CO2 23, glucose 119, creatinine 0.45, AST 42, ALT 24, and total bilirubin 6.1. Chest x-ray shows confluent right new lung base opacity that is compatible with acute chest syndrome.  Lower lung volumes.  Minor left lung base atelectasis suspected.  Stable mild cardiomegaly. Sickle cell team notified for admission.  Patient's pain persists despite IV Dilaudid, IV fluids, and IV Toradol.  Patient will be admitted for HCAP versus acute chest syndrome in the setting of sickle cell pain crisis.  Hospital Course:  Acute chest syndrome vs. CAP: On admission, chest x-ray showed confluent new right lung base opacity that is comparable with acute chest syndrome.  IV antibiotics were initiated.  Patient was transition to Augmentin 875-125 mg every 12 hours.  We will continue treatment for an additional 5 days, Augmentin 875-125 mg every 12 hours #10 was sent to patient's pharmacy. Patient advised to follow-up in primary care in 2 weeks.  At that time we will repeat chest x-ray.  Sickle cell disease with pain crisis Patient  was admitted for sickle cell pain crisis and managed appropriately with IVF, IV Dilaudid via PCA and IV Toradol, as well as other adjunct therapies per sickle cell pain management protocols.  IV Dilaudid PCA was discontinued on several occasions due to issues with safety features.  Patient refused to wear PCA detectors.  Patient was transition to home medications.  This a.m., patient refused PCA, antibiotics, and all home medications.  She  states that she felt well and was ready for discharge.  Patient will resume her home medication regimen.  And antibiotics as discussed above.  Anemia of chronic disease: Hemoglobin decreased to 6.3, which is below patient's baseline.  Patient is status post 1 unit PRBCs.  Hemoglobin improved to 7.2 prior to discharge.  Recommend that patient follows up in clinic in 2 weeks to repeat CBC with differential and BMP.  Patient was therefore discharged home today in a hemodynamically stable condition.   Kara [name] will follow-up with PCP within 1 week of this discharge.  Recommend the need for good hydration, monitoring of hydration status, avoidance of heat, cold, stress, and infection triggers. Patient was reminded of the need to seek medical attention immediately if any symptom of bleeding, anemia, or infection occurs.  Discharge Exam: Vitals:   08/20/21 0857 08/20/21 0942  BP:    Pulse:    Resp: 16 18  Temp:    SpO2: 96%    Vitals:   08/20/21 0234 08/20/21 0618 08/20/21 0857 08/20/21 0942  BP: 118/67 120/76    Pulse: 64 77    Resp: 16 16 16 18   Temp: 98.1 F (36.7 C) 98.3 F (36.8 C)    TempSrc: Oral Oral    SpO2: 97% 97% 96%   Weight:      Height:       Patient refused physical assessment Most recent vital signs stable.  Discharge Instructions  Discharge Instructions     Discharge patient   Complete by: As directed    Discharge disposition: 01-Home or Self Care   Discharge patient date: 08/20/2021      Allergies as of 08/20/2021       Reactions   Kiwi Extract Hives   Morphine And Related Hives   Paroxetine Hives        Medication List     TAKE these medications    amoxicillin-clavulanate 875-125 MG tablet Commonly known as: AUGMENTIN Take 1 tablet by mouth every 12 (twelve) hours for 7 days.   cetirizine 10 MG tablet Commonly known as: ZYRTEC Take 1 tablet (10 mg total) by mouth daily. What changed:  when to take this reasons to take this   folic acid  1 MG tablet Commonly known as: FOLVITE Take 1 tablet (1 mg total) by mouth daily.   gabapentin 300 MG capsule Commonly known as: NEURONTIN Take 1 capsule (300 mg total) by mouth 3 (three) times daily.   naloxone 4 MG/0.1ML Liqd nasal spray kit Commonly known as: NARCAN 1 spray in nostril, may use every 2-3 minutes in alternating nostrils while awaiting medical assistance What changed:  how much to take how to take this when to take this reasons to take this   omeprazole 40 MG capsule Commonly known as: PRILOSEC Take 1 capsule (40 mg total) by mouth daily.   oxyCODONE 15 mg 12 hr tablet Commonly known as: OxyCONTIN Take 1 tablet (15 mg total) by mouth every 12 (twelve) hours.   oxyCODONE 15 MG immediate release tablet Commonly known as: ROXICODONE Take 1 tablet (  15 mg total) by mouth every 4 (four) hours as needed for pain.        The results of significant diagnostics from this hospitalization (including imaging, microbiology, ancillary and laboratory) are listed below for reference.    Significant Diagnostic Studies: DG Chest Port 1 View  Result Date: 08/15/2021 CLINICAL DATA:  35 year old female with chest pain, sickle cell disease. Neck back and extremity pain. EXAM: PORTABLE CHEST 1 VIEW COMPARISON:  Portable chest 06/21/2021 and earlier. FINDINGS: Portable AP semi upright view at 0614 hours. Lower lung volumes with accentuation of cardiac size, mild underlying cardiomegaly. Other mediastinal contours are within normal limits. Visualized tracheal air column is within normal limits. No pneumothorax or pulmonary edema. But there is confluent new patchy opacity at the right lung base. Minimal increased markings at the left lung base. Stable visualized osseous structures. Paucity of bowel gas in the upper abdomen. Incidental nipple piercings. IMPRESSION: 1. Confluent new right lung base opacity compatible with Acute Chest Syndrome in this setting. 2. Lower lung volumes. Minor  left lung base atelectasis suspected. Stable mild cardiomegaly. Electronically Signed   By: Genevie Ann M.D.   On: 08/15/2021 06:19    Microbiology: Recent Results (from the past 240 hour(s))  Culture, blood (routine x 2)     Status: None   Collection Time: 08/15/21  6:21 AM   Specimen: BLOOD  Result Value Ref Range Status   Specimen Description   Final    BLOOD BLOOD LEFT FOREARM Performed at [facility] [address], [place], [place] [address]    Special Requests   Final    BOTTLES DRAWN AEROBIC AND ANAEROBIC Blood Culture adequate volume Performed at [facility] [address] [place], [place] [address]    Culture   Final    NO GROWTH 5 DAYS Performed at [facility], [place] [address] [place], [place] [address]    Report Status 08/20/2021 FINAL  Final  Culture, blood (routine x 2)     Status: None   Collection Time: 08/15/21  6:35 AM   Specimen: BLOOD  Result Value Ref Range Status   Specimen Description   Final    BLOOD BLOOD RIGHT FOREARM Performed at [facility] [address], [place], [place] [address]    Special Requests   Final    BOTTLES DRAWN AEROBIC AND ANAEROBIC Blood Culture adequate volume Performed at [facility] [address] [place], [place] [address]    Culture   Final    NO GROWTH 5 DAYS Performed at [facility], [place] [address] [place], [place] [address]    Report Status 08/20/2021 FINAL  Final  Resp Panel by RT-PCR (Flu A&B, Covid) Nasopharyngeal Swab     Status: None   Collection Time: 08/15/21  6:42 AM   Specimen: Nasopharyngeal Swab; Nasopharyngeal(NP) swabs in vial transport medium  Result Value Ref Range Status   SARS Coronavirus 2 by RT PCR NEGATIVE NEGATIVE Final    Comment: (NOTE) SARS-CoV-2 target nucleic acids are NOT DETECTED.  The SARS-CoV-2 RNA is generally detectable in upper respiratory specimens during the acute phase of infection. The  lowest concentration of SARS-CoV-2 viral copies this assay can detect is 138 copies/mL. A negative result does not preclude SARS-Cov-2 infection and should not be used as the sole basis for treatment or other patient management decisions. A negative result may occur with  improper specimen collection/handling, submission of specimen other than nasopharyngeal  swab, presence of viral mutation(s) within the areas targeted by this assay, and inadequate number of viral copies(<138 copies/mL). A negative result must be combined with clinical observations, patient history, and epidemiological information. The expected result is Negative.  Fact Sheet for Patients:  EntrepreneurPulse.com.au  Fact Sheet for Healthcare Providers:  IncredibleEmployment.be  This test is no t yet approved or cleared by the [place] FDA and  has been authorized for detection and/or diagnosis of SARS-CoV-2 by FDA under an Emergency Use Authorization (EUA). This EUA will remain  in effect (meaning this test can be used) for the duration of the COVID-19 declaration under Section 564(b)(1) of the Act, 21 U.S.C.section 360bbb-3(b)(1), unless the authorization is terminated  or revoked sooner.       Influenza A by PCR NEGATIVE NEGATIVE Final   Influenza B by PCR NEGATIVE NEGATIVE Final    Comment: (NOTE) The Xpert Xpress SARS-CoV-2/FLU/RSV plus assay is intended as an aid in the diagnosis of influenza from Nasopharyngeal swab specimens and should not be used as a sole basis for treatment. Nasal washings and aspirates are unacceptable for Xpert Xpress SARS-CoV-2/FLU/RSV testing.  Fact Sheet for Patients: EntrepreneurPulse.com.au  Fact Sheet for Healthcare Providers: IncredibleEmployment.be  This test is not yet approved or cleared by the [place] FDA and has been authorized for detection and/or diagnosis of SARS-CoV-2 by FDA under  an Emergency Use Authorization (EUA). This EUA will remain in effect (meaning this test can be used) for the duration of the COVID-19 declaration under Section 564(b)(1) of the Act, 21 U.S.C. section 360bbb-3(b)(1), unless the authorization is terminated or revoked.  Performed at [facility], [place] [address] [place], [place] [address]   MRSA Next Gen by PCR, Nasal     Status: None   Collection Time: 08/15/21 12:07 PM   Specimen: Nasal Mucosa; Nasal Swab  Result Value Ref Range Status   MRSA by PCR Next Gen NOT DETECTED NOT DETECTED Final    Comment: (NOTE) The GeneXpert MRSA Assay (FDA approved for NASAL specimens only), is one component of a comprehensive MRSA colonization surveillance program. It is not intended to diagnose MRSA infection nor to guide or monitor treatment for MRSA infections. Test performance is not FDA approved in patients less than 36 years old. Performed at [facility], [place] [address], [place], [place] [address]   Urine Culture     Status: None   Collection Time: 08/17/21  2:44 PM   Specimen: Urine, Clean Catch  Result Value Ref Range Status   Specimen Description   Final    URINE, CLEAN CATCH Performed at [facility], [place] [address] [place], [place] [address]    Special Requests   Final    NONE Performed at [facility], [place] [address], [place], [place] [address]    Culture   Final    NO GROWTH Performed at [facility], [place] [address] [place], [place] [address]    Report Status 08/18/2021 FINAL  Final     Labs: Basic Metabolic Panel: Recent Labs  Lab 08/15/21 0520 08/17/21 0608  NA 138 142  K 2.8* 3.8  CL 106 112*  CO2 23 24  GLUCOSE 119* 93  BUN 6 5*  CREATININE 0.45 0.50  CALCIUM 9.1 9.0   Liver Function Tests: Recent Labs  Lab 08/15/21 0520  AST 42*  ALT 24  ALKPHOS 56  BILITOT 6.1*  PROT 8.1  ALBUMIN 4.5   No results  for input(s):  LIPASE, AMYLASE in the last 168 hours. No results for input(s): AMMONIA in the last 168 hours. CBC: Recent Labs  Lab 08/15/21 0520 08/17/21 0608 08/17/21 2041 08/18/21 0515 08/19/21 0549  WBC 23.7* 13.9*  --  12.9* 10.5  NEUTROABS 14.8*  --   --   --  2.7  HGB 8.2* 6.3* 7.3* 7.4* 7.2*  HCT 23.2* 18.1* 20.7* 21.3* 20.7*  MCV 97.5 97.3  --  95.5 96.7  PLT 353 308  --  319 329   Cardiac Enzymes: No results for input(s): CKTOTAL, CKMB, CKMBINDEX, TROPONINI in the last 168 hours. BNP: Invalid input(s): POCBNP CBG: No results for input(s): GLUCAP in the last 168 hours.  Time coordinating discharge: 25 minutes  Signed: Donia Pounds  APRN, MSN, FNP-C [facility] [address] [place], [place] [address] [phone]  [facility] 08/20/2021, 7:40 PM

## 2021-08-20 NOTE — Progress Notes (Signed)
The unit secretary informed this RN that patient was seen getting on the elevator at approximately 13:25.  Discharge orders were put in for patient, however per unit secretary the patient immediately left once the discharge orders were in.  The patient has two peripheral IV's in place. Charge nurse notified, and [place] Hollis, NP notified. Charge RN will attempt to call the patient and have her return to have IV's removed.

## 2021-08-20 NOTE — Progress Notes (Addendum)
Per night shift RN patient was refusing tele and pulse ox, however patient is on a PCA pump. Discussed with charge nurse Jorene Guest, RN and  NP [place] Hollis to clarify. Patient is to wear safety devices if she is using PCA. Discussed this with the patient, who is refusing tele and p.ox and wishes to be disconnected from PCA.   Patient was educated on the purpose and importance of monitoring while using PCA.

## 2021-08-20 NOTE — Progress Notes (Signed)
Patient is refusing to have vital signs checked, NP made aware via secure chat

## 2021-08-22 ENCOUNTER — Telehealth: Payer: Self-pay

## 2021-08-22 IMAGING — CR DG CHEST 2V
2 series · 2 of 2 positions shown · non-contrast
Comparison: April 16, 2021.

CLINICAL DATA: Chest pain.

EXAM:
CHEST - 2 VIEW

[w chest pa]
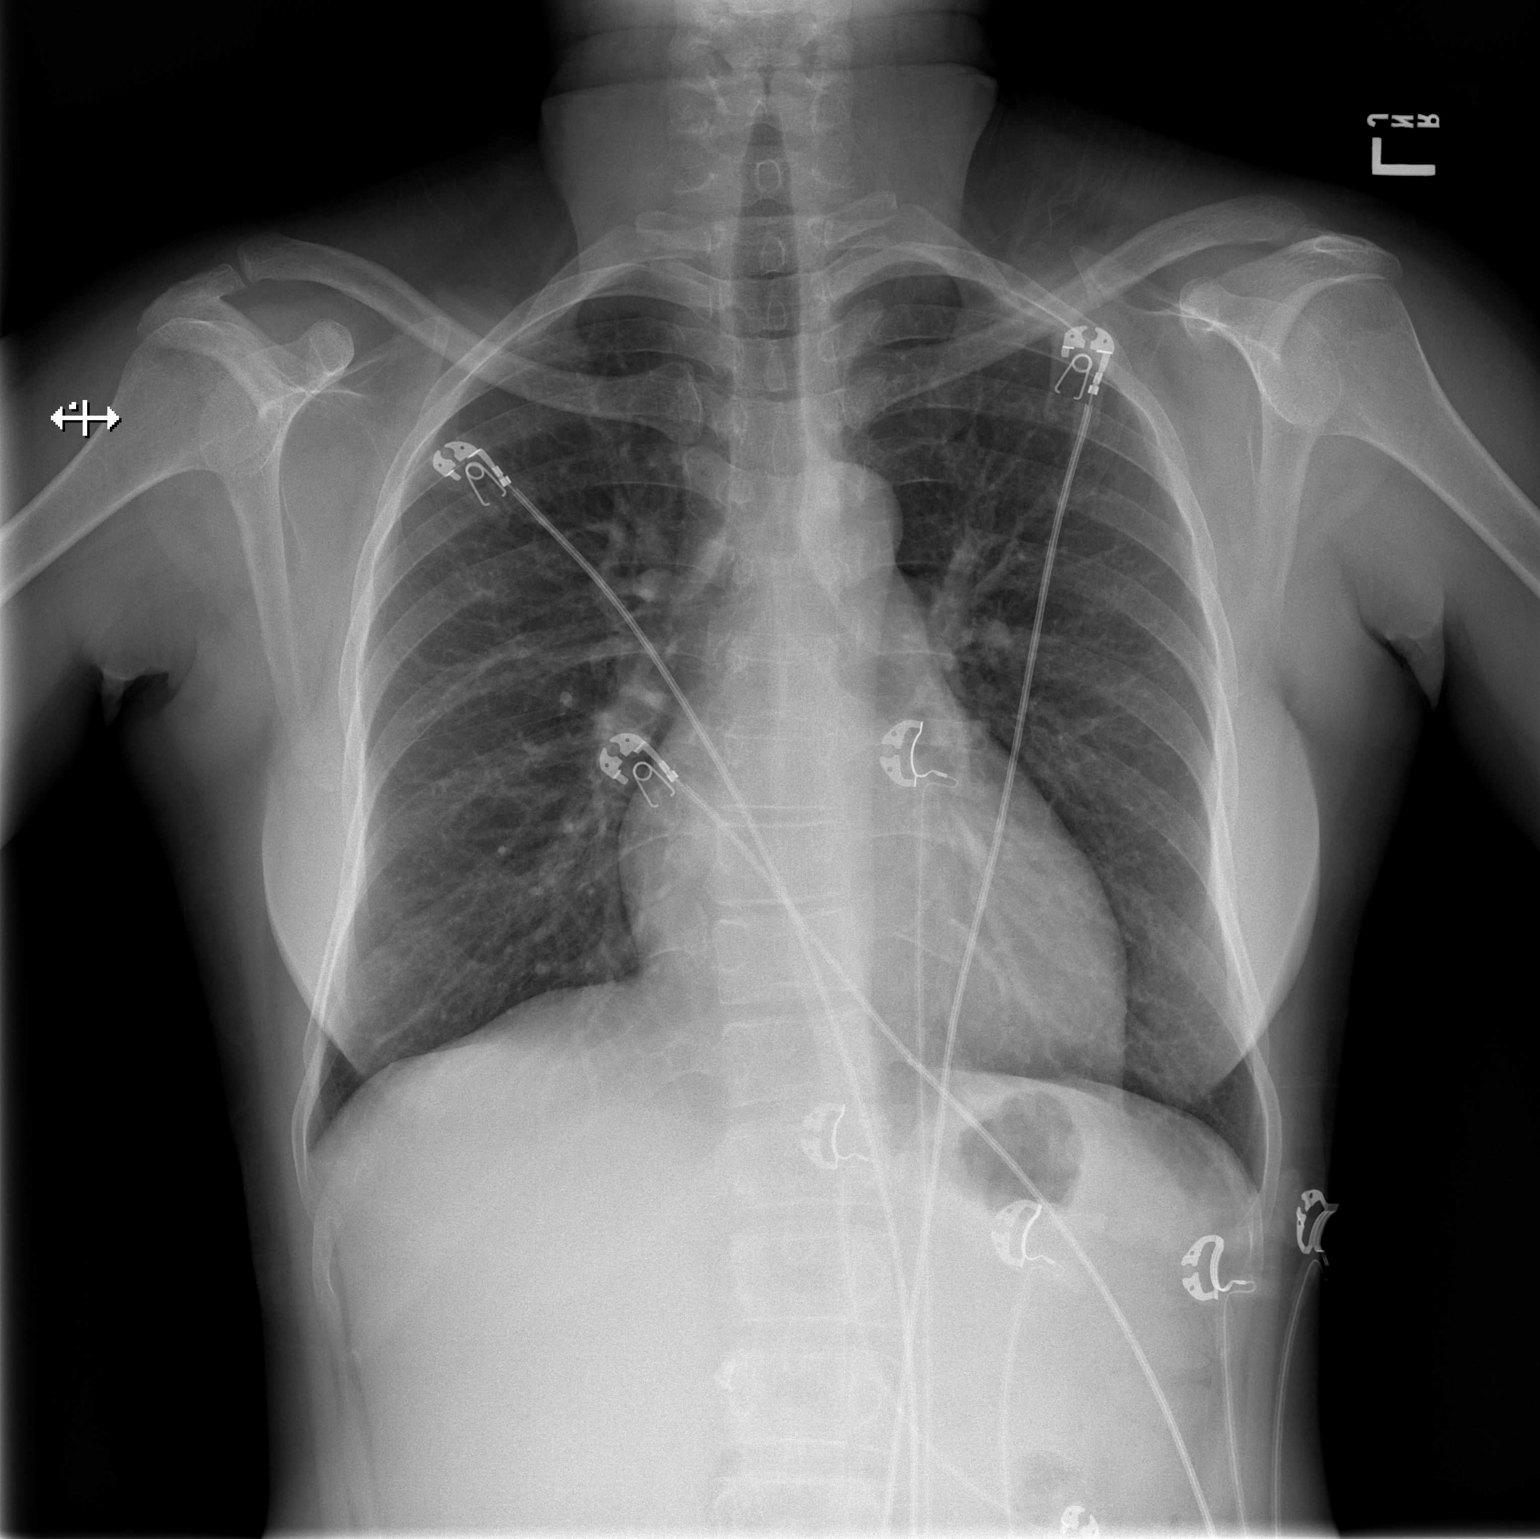

[w chest lat]
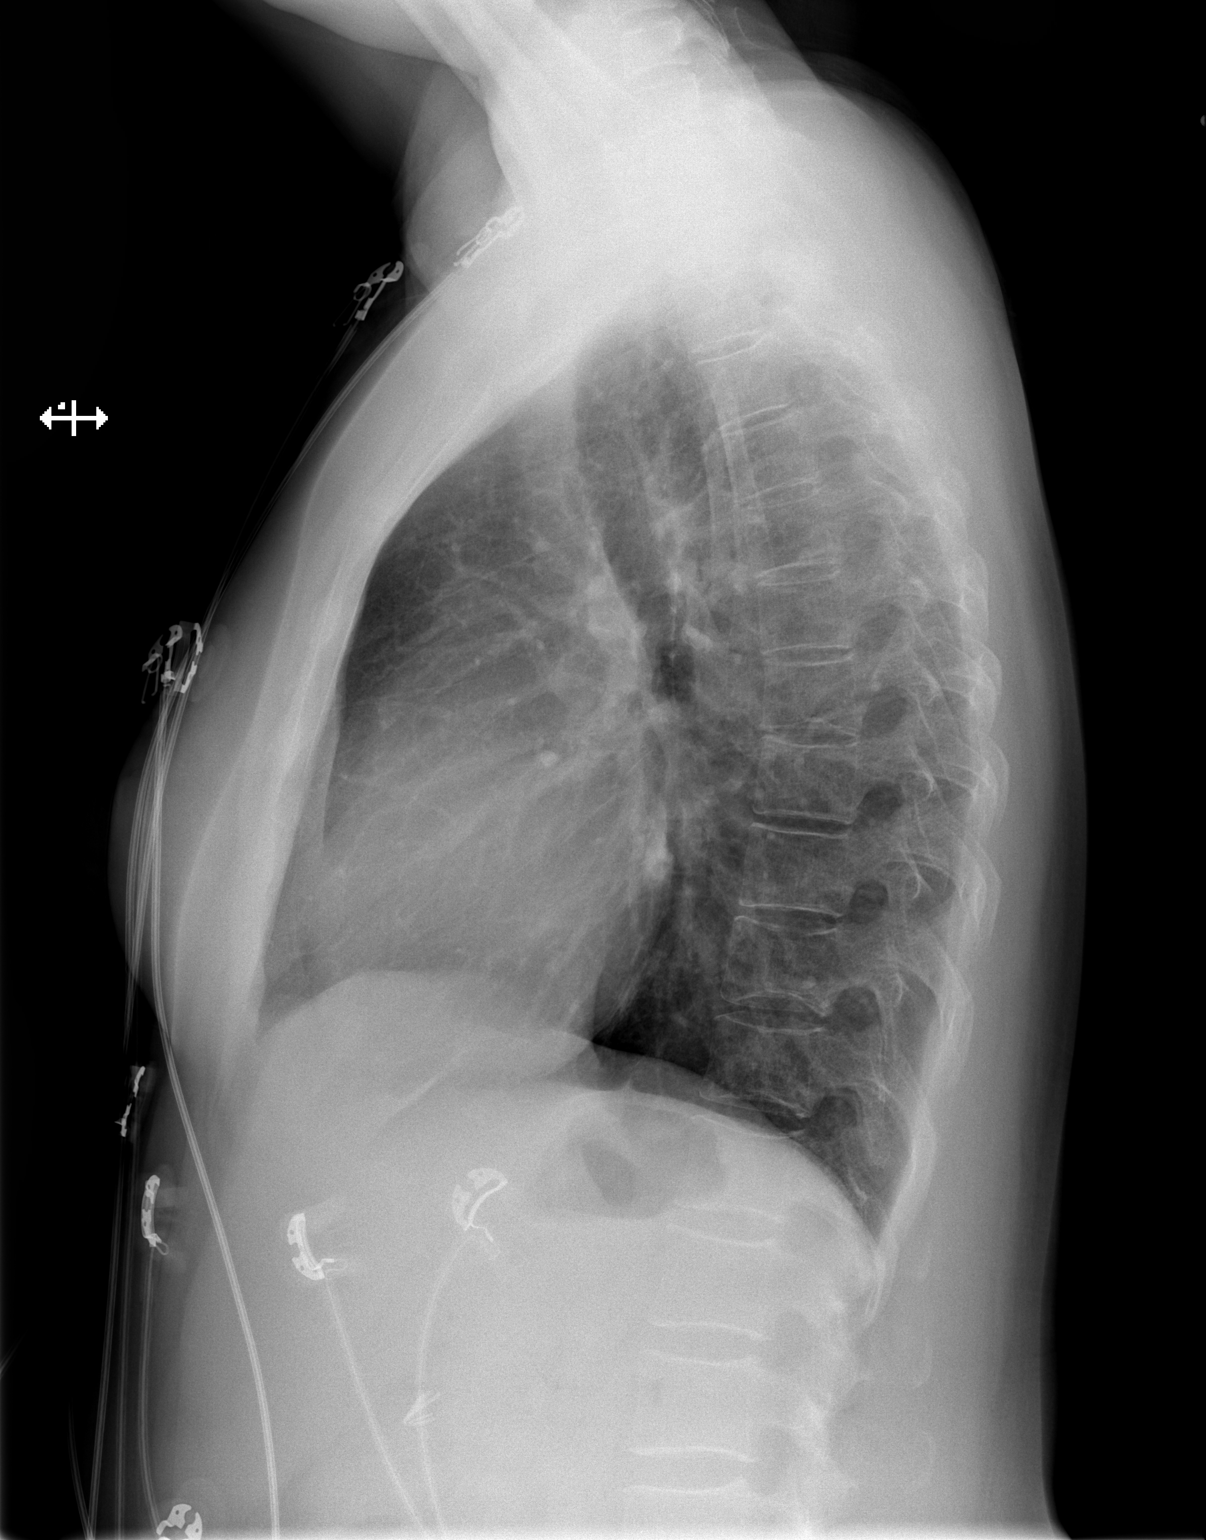

[2 of 2 positions shown; findings below may reference images not displayed]

FINDINGS: The heart size and mediastinal contours are within normal limits.
Both lungs are clear. The visualized skeletal structures are
unremarkable.
IMPRESSION: No active cardiopulmonary disease.

## 2021-08-22 NOTE — Telephone Encounter (Signed)
Oxycodone15  Immediate Extended

## 2021-08-23 ENCOUNTER — Other Ambulatory Visit: Payer: Self-pay | Admitting: Family Medicine

## 2021-08-23 DIAGNOSIS — D571 Sickle-cell disease without crisis: Secondary | ICD-10-CM

## 2021-08-23 DIAGNOSIS — D5700 Hb-SS disease with crisis, unspecified: Secondary | ICD-10-CM

## 2021-08-23 DIAGNOSIS — G8929 Other chronic pain: Secondary | ICD-10-CM

## 2021-08-23 DIAGNOSIS — F1120 Opioid dependence, uncomplicated: Secondary | ICD-10-CM

## 2021-08-23 MED ORDER — OXYCODONE HCL ER 15 MG PO T12A: 15.0000 mg | EXTENDED_RELEASE_TABLET | Freq: Two times a day (BID) | ORAL | 0 refills | Status: DC

## 2021-08-23 MED ORDER — OXYCODONE HCL 15 MG PO TABS: 15.0000 mg | ORAL_TABLET | ORAL | 0 refills | Status: DC | PRN

## 2021-08-23 NOTE — Progress Notes (Signed)
Reviewed PDMP substance reporting system prior to prescribing opiate medications. No inconsistencies noted.  Meds ordered this encounter  Medications   oxyCODONE (OXYCONTIN) 15 mg 12 hr tablet    Sig: Take 1 tablet (15 mg total) by mouth every 12 (twelve) hours.    Dispense:  60 tablet    Refill:  0    Order Specific Question:   Supervising Provider    Answer:   JEGEDE, OLUGBEMIGA E [[id number]]   oxyCODONE (ROXICODONE) 15 MG immediate release tablet    Sig: Take 1 tablet (15 mg total) by mouth every 4 (four) hours as needed for pain.    Dispense:  90 tablet    Refill:  0    Order Specific Question:   Supervising Provider    Answer:   JEGEDE, OLUGBEMIGA E [[id number]]     Kara [name] Kara Dolloff  APRN, MSN, FNP-C [facility] [address]  [place], [place] [address] [phone]  

## 2021-08-26 ENCOUNTER — Encounter: Payer: Medicare Other | Admitting: Nurse Practitioner

## 2021-08-26 ENCOUNTER — Other Ambulatory Visit: Payer: Self-pay

## 2021-08-26 ENCOUNTER — Encounter: Payer: Self-pay | Admitting: Nurse Practitioner

## 2021-08-26 ENCOUNTER — Emergency Department (HOSPITAL_COMMUNITY): Payer: Medicare Other

## 2021-08-26 ENCOUNTER — Emergency Department (HOSPITAL_COMMUNITY)
Admission: EM | Admit: 2021-08-26 | Discharge: 2021-08-27 | Disposition: A | Discharge status: Home / Self Care | Payer: Medicare Other | Attending: Emergency Medicine | Admitting: Emergency Medicine

## 2021-08-26 ENCOUNTER — Encounter (HOSPITAL_COMMUNITY): Payer: Self-pay

## 2021-08-26 DIAGNOSIS — Z8616 Personal history of COVID-19: Secondary | ICD-10-CM | POA: Insufficient documentation

## 2021-08-26 DIAGNOSIS — Z79899 Other long term (current) drug therapy: Secondary | ICD-10-CM | POA: Insufficient documentation

## 2021-08-26 DIAGNOSIS — D5700 Hb-SS disease with crisis, unspecified: Secondary | ICD-10-CM | POA: Insufficient documentation

## 2021-08-26 DIAGNOSIS — F17210 Nicotine dependence, cigarettes, uncomplicated: Secondary | ICD-10-CM | POA: Insufficient documentation

## 2021-08-26 LAB — COMPREHENSIVE METABOLIC PANEL
ALT: 24 U/L (ref 0–44)
AST: 52 U/L — ABNORMAL HIGH (ref 15–41)
Albumin: 4.4 g/dL (ref 3.5–5.0)
Alkaline Phosphatase: 59 U/L (ref 38–126)
Anion gap: 8 (ref 5–15)
BUN: <5 mg/dL — ABNORMAL LOW (ref 6–20)
CO2: 29 mmol/L (ref 22–32)
Calcium: 9.2 mg/dL (ref 8.9–10.3)
Chloride: 102 mmol/L (ref 98–111)
Creatinine, Ser: 0.43 mg/dL — ABNORMAL LOW (ref 0.44–1.00)
GFR, Estimated: >60 mL/min (ref >60)
Glucose, Bld: 110 mg/dL — ABNORMAL HIGH (ref 70–99)
Potassium: 3 mmol/L — ABNORMAL LOW (ref 3.5–5.1)
Sodium: 139 mmol/L (ref 135–145)
Total Bilirubin: 2.9 mg/dL — ABNORMAL HIGH (ref 0.3–1.2)
Total Protein: 7.9 g/dL (ref 6.5–8.1)

## 2021-08-26 LAB — CBC WITH DIFFERENTIAL/PLATELET
Abs Immature Granulocytes: 0.43 10*3/uL — ABNORMAL HIGH (ref 0.00–0.07)
Basophils Absolute: 0.2 10*3/uL — ABNORMAL HIGH (ref 0.0–0.1)
Basophils Relative: 1 %
Eosinophils Absolute: 0.4 10*3/uL (ref 0.0–0.5)
Eosinophils Relative: 2 %
HCT: 24.7 % — ABNORMAL LOW (ref 36.0–46.0)
Hemoglobin: 8.7 g/dL — ABNORMAL LOW (ref 12.0–15.0)
Immature Granulocytes: 2 %
Lymphocytes Relative: 41 %
Lymphs Abs: 9.8 10*3/uL — ABNORMAL HIGH (ref 0.7–4.0)
MCH: 34 pg (ref 26.0–34.0)
MCHC: 35.2 g/dL (ref 30.0–36.0)
MCV: 96.5 fL (ref 80.0–100.0)
Monocytes Absolute: 1.7 10*3/uL — ABNORMAL HIGH (ref 0.1–1.0)
Monocytes Relative: 7 %
Neutro Abs: 11.5 10*3/uL — ABNORMAL HIGH (ref 1.7–7.7)
Neutrophils Relative %: 47 %
Platelets: 366 10*3/uL (ref 150–400)
RBC: 2.56 MIL/uL — ABNORMAL LOW (ref 3.87–5.11)
RDW: 23.1 % — ABNORMAL HIGH (ref 11.5–15.5)
WBC: 24 10*3/uL — ABNORMAL HIGH (ref 4.0–10.5)
nRBC: 2.1 % — ABNORMAL HIGH (ref 0.0–0.2)

## 2021-08-26 LAB — I-STAT BETA HCG BLOOD, ED (MC, WL, AP ONLY): I-stat hCG, quantitative: <5 m[IU]/mL (ref <5)

## 2021-08-26 LAB — RETICULOCYTES
Immature Retic Fract: 38.8 % — ABNORMAL HIGH (ref 2.3–15.9)
RBC.: 2.56 MIL/uL — ABNORMAL LOW (ref 3.87–5.11)
Retic Count, Absolute: 438 10*3/uL — ABNORMAL HIGH (ref 19.0–186.0)
Retic Ct Pct: 16.5 % — ABNORMAL HIGH (ref 0.4–3.1)

## 2021-08-26 MED ORDER — HYDROMORPHONE HCL 2 MG/ML IJ SOLN: 2.0000 mg | INTRAMUSCULAR | Status: DC

## 2021-08-26 MED ORDER — DIPHENHYDRAMINE HCL 50 MG/ML IJ SOLN: 25.0000 mg | Freq: Once | INTRAMUSCULAR | Status: DC

## 2021-08-26 MED ORDER — HYDROMORPHONE HCL 2 MG/ML IJ SOLN
2.0000 mg | Freq: Once | INTRAMUSCULAR | Status: AC
  Administered 2021-08-26: 2 mg via INTRAVENOUS
  Filled 2021-08-26: qty 1

## 2021-08-26 MED ORDER — KETOROLAC TROMETHAMINE 30 MG/ML IJ SOLN
30.0000 mg | Freq: Once | INTRAMUSCULAR | Status: AC
  Administered 2021-08-26: 30 mg via INTRAVENOUS
  Filled 2021-08-26: qty 1

## 2021-08-26 MED ORDER — ONDANSETRON HCL 4 MG/2ML IJ SOLN
4.0000 mg | INTRAMUSCULAR | Status: DC | PRN
Start: 2021-08-26 — End: 2021-08-27

## 2021-08-26 NOTE — ED Triage Notes (Signed)
Patient states she went to the [facility] and was not seen. Patient c/o sickle cell pain "all over."

## 2021-08-26 NOTE — ED Provider Notes (Signed)
Emergency Medicine Provider Triage Evaluation Note  Kara [name] , a 35 y.o. female  was evaluated in triage.  Pt complains of cell crisis.  Sickle cell crisis.  Patient complains of 2 days of bilateral lower extremity, low back, and bilateral arm pain.  States that she moved to sickle cell clinic today but was sent here because she just recently had acute chest syndrome and was admitted.  Review of Systems  Positive: Leg pain, back pain Negative: Chest pain, abdominal pain Physical Exam  BP 125/82 (BP Location: Left Arm)   Pulse (!) 127   Temp 98.1 F (36.7 C) (Oral)   Resp 18   Ht 5\' 3"  (1.6 m)   Wt 58.5 kg   LMP 08/24/2021   SpO2 90%   BMI 22.86 kg/m  Gen:   Awake, no distress   Resp:  Normal effort, lung sounds clear bilaterally MSK:   Moves extremities without difficulty, pain to bilateral upper and lower extremities to palpation Other:  S1/S2 without murmurs, rubs, gallops.  Abdomen without tenderness, soft.  Medical Decision Making  Medically screening exam initiated at 5:06 PM.  Appropriate orders placed.  10/24/2021 was informed that the remainder of the evaluation will be completed by another provider, this initial triage assessment does not replace that evaluation, and the importance of remaining in the ED until their evaluation is complete.     [name], PA-C 08/26/21 1708    10/26/21, DO 08/26/21 1728

## 2021-08-26 NOTE — ED Provider Notes (Signed)
[facility]-EMERGENCY DEPT Provider Note   CSN: [id number] Arrival date & time: 08/26/21  1525     History No chief complaint on file.   Mikenzie Mccannon is a 35 y.o. female.  Patient is a 35 year old female with history of sickle cell disease.  Patient presenting today with complaints of pain in her back and extremities consistent with her prior sickle cell crises.  Patient recently admitted for acute chest syndrome.  She denies to me she is having chest pain or difficulty breathing.  She denies fevers or chills.  She was told by the sickle cell clinic to come to the ER to be evaluated.  The history is provided by the patient.      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) 08/15/2021   COVID-19 06/22/2021   Hypotension 06/22/2021   Sickle cell anemia with crisis (HCC) 06/22/2021   Sickle cell anemia (HCC) 04/17/2021   Chronic post-traumatic stress disorder (PTSD) 03/01/2021   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HCC) 03/01/2021   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   Pain and swelling of left lower extremity 09/01/2020   Abscess of right buttock 04/17/2020   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 04/13/2020   Sickle cell anemia with pain (HCC) 12/08/2019   Decreased appetite 08/28/2019   Chronic pain of right knee July 29, 2019   Gastroesophageal reflux disease without esophagitis 2019-07-29   Anxiety 07-29-19   Death of family member Jul 29, 2019   Chronic, continuous use of opioids 07-29-2019   Headache, new daily persistent (NDPH) 07/04/2019   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   Chronic pain syndrome 12/27/2018   Anemia of  chronic disease 12/27/2018   Pulmonary hypertension (HCC) 04/04/2018   Sickle cell disease (HCC) 04/04/2018   Arthritis 04/04/2018   Chest pain on breathing    Leukocytosis 02/06/2018   Sickle cell crisis (HCC) 02/05/2018   Sickle cell pain crisis (HCC) 10/29/2017   Other social stressor 04/28/2017   Mood disorder (HCC) 03/10/2017   Complex care coordination 06/02/2016   Tobacco abuse 07/07/2012   Contraception 12/08/2010    Past Surgical History:  Procedure Laterality Date   CHOLECYSTECTOMY     DILATION AND CURETTAGE OF UTERUS     GALLBLADDER SURGERY       OB History     Gravida  1   Para      Term      Preterm      AB  1   Living         SAB  1   IAB      Ectopic      Multiple      Live Births              Family History  Adopted: Yes    Social History   Tobacco Use   Smoking status: Every Day    Packs/day: 0.50    Types: Cigarettes   Smokeless tobacco: Never  Vaping Use   Vaping Use: Never used  Substance Use Topics   Alcohol use: No   Drug use: Not Currently    Types:  Marijuana    Home Medications Prior to Admission medications   Medication Sig Start Date End Date Taking? Authorizing Provider  amoxicillin-clavulanate (AUGMENTIN) 875-125 MG tablet Take 1 tablet by mouth every 12 (twelve) hours for 7 days. 08/20/21 08/27/21  Massie Maroon, FNP  cetirizine (ZYRTEC) 10 MG tablet Take 1 tablet (10 mg total) by mouth daily. Patient taking differently: Take 10 mg by mouth daily as needed for allergies. 04/14/20   [name], FNP  folic acid (FOLVITE) 1 MG tablet Take 1 tablet (1 mg total) by mouth daily. 07/05/21   Massie Maroon, FNP  gabapentin (NEURONTIN) 300 MG capsule Take 1 capsule (300 mg total) by mouth 3 (three) times daily. 05/05/21   Massie Maroon, FNP  naloxone [facility]) nasal spray 4 mg/0.1 mL 1 spray in nostril, may use every 2-3 minutes in alternating nostrils while awaiting medical assistance Patient taking  differently: Place 1 spray into the nose once as needed (overdose). 1 spray in nostril, may use every 2-3 minutes in alternating nostrils while awaiting medical assistance 05/05/21   Massie Maroon, FNP  omeprazole (PRILOSEC) 40 MG capsule Take 1 capsule (40 mg total) by mouth daily. 04/14/20   [name], FNP  oxyCODONE (OXYCONTIN) 15 mg 12 hr tablet Take 1 tablet (15 mg total) by mouth every 12 (twelve) hours. 08/23/21   Massie Maroon, FNP  oxyCODONE (ROXICODONE) 15 MG immediate release tablet Take 1 tablet (15 mg total) by mouth every 4 (four) hours as needed for pain. 08/23/21   Massie Maroon, FNP    Allergies    Kiwi extract, Morphine and related, and Paroxetine  Review of Systems   Review of Systems  All other systems reviewed and are negative.  Physical Exam Updated Vital Signs BP 111/63   Pulse 86   Temp 98.1 F (36.7 C) (Oral)   Resp 18   Ht 5\' 3"  (1.6 m)   Wt 58.5 kg   LMP 08/23/2021 (Exact Date)   SpO2 93%   BMI 22.86 kg/m   Physical Exam Vitals and nursing note reviewed.  Constitutional:      General: She is not in acute distress.    Appearance: She is well-developed. She is not diaphoretic.  HENT:     Head: Normocephalic and atraumatic.  Cardiovascular:     Rate and Rhythm: Normal rate and regular rhythm.     Heart sounds: No murmur heard.   No friction rub. No gallop.  Pulmonary:     Effort: Pulmonary effort is normal. No respiratory distress.     Breath sounds: Normal breath sounds. No wheezing.  Abdominal:     General: Bowel sounds are normal. There is no distension.     Palpations: Abdomen is soft.     Tenderness: There is no abdominal tenderness.  Musculoskeletal:        General: Normal range of motion.     Cervical back: Normal range of motion and neck supple.  Skin:    General: Skin is warm and dry.  Neurological:     General: No focal deficit present.     Mental Status: She is alert and oriented to person, place, and time.     ED Results / Procedures / Treatments   Labs (all labs ordered are listed, but only abnormal results are displayed) Labs Reviewed  CBC WITH DIFFERENTIAL/PLATELET - Abnormal; Notable for the following components:      Result Value   WBC 24.0 (*)  RBC 2.56 (*)    Hemoglobin 8.7 (*)    HCT 24.7 (*)    RDW 23.1 (*)    nRBC 2.1 (*)    Neutro Abs 11.5 (*)    Lymphs Abs 9.8 (*)    Monocytes Absolute 1.7 (*)    Basophils Absolute 0.2 (*)    Abs Immature Granulocytes 0.43 (*)    All other components within normal limits  RETICULOCYTES - Abnormal; Notable for the following components:   Retic Ct Pct 16.5 (*)    RBC. 2.56 (*)    Retic Count, Absolute 438.0 (*)    Immature Retic Fract 38.8 (*)    All other components within normal limits  COMPREHENSIVE METABOLIC PANEL  I-STAT BETA HCG BLOOD, ED ([facility], [facility], [facility] ONLY)    EKG None  Radiology DG Chest 2 View  Result Date: 08/26/2021 CLINICAL DATA:  Sickle cell disease, right-sided chest pain, extremity swelling EXAM: CHEST - 2 VIEW COMPARISON:  08/15/2021 FINDINGS: Frontal and lateral views of the chest demonstrates stable enlargement of the cardiac silhouette. There has been interval clearing of the right basilar consolidation seen previously, with no acute airspace disease, effusion, or pneumothorax. No acute bony abnormalities. IMPRESSION: 1. No acute intrathoracic process. Electronically Signed   By: Sharlet Salina M.D.   On: 08/26/2021 17:31    Procedures Procedures   Medications Ordered in ED Medications  HYDROmorphone (DILAUDID) injection 2 mg (2 mg Intravenous Not Given 08/26/21 2312)  HYDROmorphone (DILAUDID) injection 2 mg (2 mg Intravenous Not Given 08/26/21 2312)  diphenhydrAMINE (BENADRYL) injection 25 mg (25 mg Intravenous Patient Refused/Not Given 08/26/21 2314)  ondansetron (ZOFRAN) injection 4 mg (has no administration in time range)  HYDROmorphone (DILAUDID) injection 2 mg (2 mg Intravenous Given 08/26/21 2318)   ketorolac (TORADOL) 30 MG/ML injection 30 mg (30 mg Intravenous Given 08/26/21 2318)    ED Course  I have reviewed the triage vital signs and the nursing notes.  Pertinent labs & imaging results that were available during my care of the patient were reviewed by me and considered in my medical decision making (see chart for details).    MDM Rules/Calculators/A&P  Patient is a 35 year old female with history of sickle cell disease and recent admission for acute chest.  She presents today with pain to her back and extremities consistent with her prior sickle cell crises.  Patient's work-up today reveals a white count of 21,000, the etiology of which I am uncertain.  Her chest x-ray shows no acute intrathoracic process and the signs of acute chest seen on prior x-ray seem to have resolved.  She has no other symptoms and is afebrile.  She has received pain medication and IV fluids.  She feels better, but still complains of ongoing discomfort.  Admission discussed, however patient says her birthday is upcoming and wants to be home.  Patient to be discharged at her request and return as needed if symptoms worsen  Final Clinical Impression(s) / ED Diagnoses Final diagnoses:  None    Rx / DC Orders ED Discharge Orders     None        Geoffery Lyons, MD 08/27/21 0145

## 2021-08-27 DIAGNOSIS — D5700 Hb-SS disease with crisis, unspecified: Secondary | ICD-10-CM | POA: Diagnosis not present

## 2021-08-27 MED ORDER — HYDROMORPHONE HCL 2 MG/ML IJ SOLN
2.0000 mg | Freq: Once | INTRAMUSCULAR | Status: AC
Start: 2021-08-27 — End: 2021-08-27
  Administered 2021-08-27: 2 mg via INTRAVENOUS
  Filled 2021-08-27: qty 1

## 2021-08-27 NOTE — Discharge Instructions (Addendum)
Continue home medications as previously prescribed.  Return to the emergency department if you develop high fever, worsening pain, difficulty breathing, chest pain, or other new and concerning symptoms.

## 2021-09-02 ENCOUNTER — Telehealth: Payer: Self-pay

## 2021-09-02 NOTE — Telephone Encounter (Signed)
Oxycodone Gabapentin  

## 2021-09-03 ENCOUNTER — Other Ambulatory Visit: Payer: Self-pay | Admitting: Family Medicine

## 2021-09-03 DIAGNOSIS — G8929 Other chronic pain: Secondary | ICD-10-CM

## 2021-09-03 DIAGNOSIS — D571 Sickle-cell disease without crisis: Secondary | ICD-10-CM

## 2021-09-03 MED ORDER — OXYCODONE HCL 15 MG PO TABS: 15.0000 mg | ORAL_TABLET | ORAL | 0 refills | Status: DC | PRN

## 2021-09-03 NOTE — Progress Notes (Signed)
Reviewed PDMP substance reporting system prior to prescribing opiate medications. No inconsistencies noted.  Meds ordered this encounter  Medications   oxyCODONE (ROXICODONE) 15 MG immediate release tablet    Sig: Take 1 tablet (15 mg total) by mouth every 4 (four) hours as needed for pain.    Dispense:  90 tablet    Refill:  0    Order Specific Question:   Supervising Provider    Answer:   JEGEDE, OLUGBEMIGA E [[id number]]   Kara [name] [name]  APRN, MSN, FNP-C [facility] [address]  [place], [place] [address] [phone]  

## 2021-09-06 ENCOUNTER — Other Ambulatory Visit: Payer: Self-pay | Admitting: Family Medicine

## 2021-09-19 ENCOUNTER — Telehealth: Payer: Self-pay

## 2021-09-19 ENCOUNTER — Telehealth (HOSPITAL_COMMUNITY): Payer: Self-pay

## 2021-09-19 NOTE — Telephone Encounter (Signed)
Patient called in. Complains of pain in back and legs rates 6/10. Denied chest pain, abd pain, fever, N/V/D. Wants to come in for treatment. Last took Oxycontin at 3:00am. Pt states her neighbor is her transportation today. Pt denies exposure to anyone covid positive in the last two weeks, denies having been covid tested in last two weeks, denies flu like symptoms today. Dr. Hyman Hopes notified, pt not approved to be seen in the day hospital today, instructed pt to go to ED if care is needed today, or manage pain at home with medications and call day hospital tomorrow morning. Pt made aware, verbalized understanding.

## 2021-09-19 NOTE — Telephone Encounter (Signed)
Oxycodone  Oxy contin  Naproxen 

## 2021-09-21 ENCOUNTER — Other Ambulatory Visit: Payer: Self-pay

## 2021-09-21 ENCOUNTER — Encounter: Payer: Self-pay | Admitting: Nurse Practitioner

## 2021-09-21 ENCOUNTER — Other Ambulatory Visit: Payer: Self-pay | Admitting: Family Medicine

## 2021-09-21 ENCOUNTER — Ambulatory Visit (INDEPENDENT_AMBULATORY_CARE_PROVIDER_SITE_OTHER): Payer: Medicare Other | Admitting: Nurse Practitioner

## 2021-09-21 VITALS — BP 131/69 | HR 100 | Temp 98.2°F | Ht 63.0 in | Wt 128.0 lb

## 2021-09-21 DIAGNOSIS — R300 Dysuria: Secondary | ICD-10-CM

## 2021-09-21 DIAGNOSIS — G8929 Other chronic pain: Secondary | ICD-10-CM

## 2021-09-21 DIAGNOSIS — R8290 Unspecified abnormal findings in urine: Secondary | ICD-10-CM

## 2021-09-21 DIAGNOSIS — D5700 Hb-SS disease with crisis, unspecified: Secondary | ICD-10-CM

## 2021-09-21 DIAGNOSIS — R350 Frequency of micturition: Secondary | ICD-10-CM

## 2021-09-21 DIAGNOSIS — D571 Sickle-cell disease without crisis: Secondary | ICD-10-CM | POA: Diagnosis not present

## 2021-09-21 DIAGNOSIS — F1120 Opioid dependence, uncomplicated: Secondary | ICD-10-CM

## 2021-09-21 LAB — POCT URINALYSIS DIP (CLINITEK)
Bilirubin, UA: NEGATIVE
Glucose, UA: NEGATIVE mg/dL
Ketones, POC UA: NEGATIVE mg/dL
Nitrite, UA: NEGATIVE
Spec Grav, UA: 1.01 (ref 1.010–1.025)
Urobilinogen, UA: 4 E.U./dL — AB
pH, UA: 5.5 (ref 5.0–8.0)

## 2021-09-21 MED ORDER — OXYCODONE HCL ER 15 MG PO T12A: 15.0000 mg | EXTENDED_RELEASE_TABLET | Freq: Two times a day (BID) | ORAL | 0 refills | Status: DC

## 2021-09-21 MED ORDER — OXYCODONE HCL 15 MG PO TABS: 15.0000 mg | ORAL_TABLET | ORAL | 0 refills | Status: DC | PRN

## 2021-09-21 MED ORDER — CEPHALEXIN 500 MG PO CAPS: 500.0000 mg | ORAL_CAPSULE | Freq: Four times a day (QID) | ORAL | 0 refills | Status: AC

## 2021-09-21 MED ORDER — NAPROXEN 500 MG PO TABS: 500.0000 mg | ORAL_TABLET | Freq: Three times a day (TID) | ORAL | 3 refills | Status: DC | PRN

## 2021-09-21 NOTE — Progress Notes (Signed)
[facility] Patient [facility] [address] [place], [place]  [address] Phone:  [phone]   Fax:  [phone] Subjective:   Patient ID: Kara [name], female    DOB: Feb 10, 1986, 35 y.o.   MRN: [id number]  Chief Complaint  Patient presents with   Follow-up    Medication refills. Possible UTI strong odor, frequent urination.  Requesting naproxen.    HPI Tallyn Fittro 35 y.o. female  has a past medical history of Arthritis, Chronic migraine, GERD (gastroesophageal reflux disease), cholecystectomy (2015), Pulmonary hypertension (HCC), Sickle cell anemia (HCC), and Tendinitis. To the [facility] for urinary symptoms and medication refill. Patient states that she has had frequent urination with odor x 2 days. Symptoms mimic past episodes of UTI. Has been taking garlic and probiotics, in addition to increased fluid intake to assist in managing symptoms. Requesting antibiotics, has been successfully treated with Keflex in the past.   Patient also requested refill of medications, oxycodone, oxycontin, gabapentin and naproxen. Denies any other symptoms today. Denies any fever. Denies any fatigue, chest pain, shortness of breath, HA or dizziness. Denies any blurred vision, numbness or tingling.   Past Medical History:  Diagnosis Date   Arthritis    Chronic migraine    GERD (gastroesophageal reflux disease)    Hx of cholecystectomy 2015   Pulmonary hypertension (HCC)    Sickle cell anemia (HCC)    Tendinitis    left elbow    Past Surgical History:  Procedure Laterality Date   CHOLECYSTECTOMY     DILATION AND CURETTAGE OF UTERUS     GALLBLADDER SURGERY      Family History  Adopted: Yes    Social History   Socioeconomic History   Marital status: Single    Spouse name: Not on file   Number of children: Not on file   Years of education: Not on file   Highest education level: Not on file  Occupational History   Not on file  Tobacco Use   Smoking status: Every Day     Packs/day: 0.50    Types: Cigarettes   Smokeless tobacco: Never  Vaping Use   Vaping Use: Never used  Substance and Sexual Activity   Alcohol use: No   Drug use: Not Currently    Types: Marijuana   Sexual activity: Yes    Birth control/protection: None  Other Topics Concern   Not on file  Social History Narrative   Not on file   Social Determinants of Health   Financial Resource Strain: High Risk   Difficulty of Paying Living Expenses: Very hard  Food Insecurity: No Food Insecurity   Worried About Programme researcher, broadcasting/film/video in the Last Year: Never true   Ran Out of Food in the Last Year: Never true  Transportation Needs: No Transportation Needs   Lack of Transportation (Medical): No   Lack of Transportation (Non-Medical): No  Physical Activity: Inactive   Days of Exercise per Week: 0 days   Minutes of Exercise per Session: 0 min  Stress: Stress Concern Present   Feeling of Stress : To some extent  Social Connections: Moderately Isolated   Frequency of Communication with Friends and Family: More than three times a week   Frequency of Social Gatherings with Friends and Family: Never   Attends Religious Services: Never   Database administrator or Organizations: Yes   Attends Engineer, structural: More than 4 times per year   Marital Status: Never married  Intimate Partner Violence: Not on file    Outpatient Medications Prior to Visit  Medication Sig Dispense Refill   cetirizine (ZYRTEC) 10 MG tablet Take 1 tablet (10 mg total) by mouth daily. (Patient taking differently: Take 10 mg by mouth daily as needed for allergies.) 30 tablet 11   folic acid (FOLVITE) 1 MG tablet Take 1 tablet (1 mg total) by mouth daily. 30 tablet 11   gabapentin (NEURONTIN) 300 MG capsule TAKE 1 CAPSULE BY MOUTH THREE TIMES A DAY 90 capsule 1   naloxone (NARCAN) nasal spray 4 mg/0.1 mL 1 spray in nostril, may use every 2-3 minutes in alternating nostrils while awaiting medical assistance  (Patient taking differently: Place 1 spray into the nose once as needed (overdose). 1 spray in nostril, may use every 2-3 minutes in alternating nostrils while awaiting medical assistance) 1 each 1   omeprazole (PRILOSEC) 40 MG capsule Take 1 capsule (40 mg total) by mouth daily. 30 capsule 11   oxyCODONE (OXYCONTIN) 15 mg 12 hr tablet Take 1 tablet (15 mg total) by mouth every 12 (twelve) hours. 60 tablet 0   oxyCODONE (ROXICODONE) 15 MG immediate release tablet Take 1 tablet (15 mg total) by mouth every 4 (four) hours as needed for pain. 90 tablet 0   No facility-administered medications prior to visit.    Allergies  Allergen Reactions   Kiwi Extract Hives   Morphine And Related Hives   Paroxetine Hives    Review of Systems  Constitutional:  Negative for chills, fever and malaise/fatigue.  Eyes: Negative.   Respiratory:  Negative for cough and shortness of breath.   Cardiovascular:  Negative for chest pain, palpitations and leg swelling.  Gastrointestinal:  Negative for abdominal pain, blood in stool, constipation, diarrhea, nausea and vomiting.  Genitourinary:  Positive for dysuria and frequency. Negative for flank pain, hematuria and urgency.  Musculoskeletal: Negative.   Skin: Negative.   Neurological: Negative.   Psychiatric/Behavioral:  Negative for depression. The patient is not nervous/anxious.   All other systems reviewed and are negative.     Objective:    Physical Exam Constitutional:      General: She is not in acute distress.    Appearance: Normal appearance. She is normal weight.  HENT:     Head: Normocephalic.  Cardiovascular:     Rate and Rhythm: Normal rate and regular rhythm.     Pulses: Normal pulses.     Heart sounds: Normal heart sounds.     Comments: No obvious peripheral edema Pulmonary:     Effort: Pulmonary effort is normal.     Breath sounds: Normal breath sounds.  Abdominal:     General: Abdomen is flat. Bowel sounds are normal.      Palpations: Abdomen is soft.     Tenderness: There is no right CVA tenderness or left CVA tenderness.  Skin:    General: Skin is warm and dry.     Capillary Refill: Capillary refill takes less than 2 seconds.  Neurological:     General: No focal deficit present.     Mental Status: She is alert and oriented to person, place, and time.  Psychiatric:        Mood and Affect: Mood normal.        Behavior: Behavior normal.        Thought Content: Thought content normal.        Judgment: Judgment normal.    BP 131/69   Pulse 100   Temp 98.2 F (  36.8 C)   Ht 5\' 3"  (1.6 m)   Wt 128 lb 0.6 oz (58.1 kg)   LMP 09/20/2021 (Exact Date)   SpO2 97%   BMI 22.68 kg/m  Wt Readings from Last 3 Encounters:  09/21/21 128 lb 0.6 oz (58.1 kg)  08/26/21 129 lb 0.8 oz (58.5 kg)  08/26/21 129 lb 0.8 oz (58.5 kg)    Immunization History  Administered Date(s) Administered   Influenza Split 09/18/2014    Diabetic Foot Exam - Simple   No data filed     No results found for: TSH Lab Results  Component Value Date   WBC 24.0 (H) 08/26/2021   HGB 8.7 (L) 08/26/2021   HCT 24.7 (L) 08/26/2021   MCV 96.5 08/26/2021   PLT 366 08/26/2021   Lab Results  Component Value Date   NA 139 08/26/2021   K 3.0 (L) 08/26/2021   CO2 29 08/26/2021   GLUCOSE 110 (H) 08/26/2021   BUN <5 (L) 08/26/2021   CREATININE 0.43 (L) 08/26/2021   BILITOT 2.9 (H) 08/26/2021   ALKPHOS 59 08/26/2021   AST 52 (H) 08/26/2021   ALT 24 08/26/2021   PROT 7.9 08/26/2021   ALBUMIN 4.4 08/26/2021   CALCIUM 9.2 08/26/2021   ANIONGAP 8 08/26/2021   No results found for: CHOL No results found for: HDL No results found for: LDLCALC No results found for: TRIG No results found for: CHOLHDL No results found for: HGBA1C     Assessment & Plan:   Problem List Items Addressed This Visit       Other   Sickle cell crisis ([place])   Hb-SS disease without crisis ([place])   Relevant Medications   naproxen (NAPROSYN) 500 MG  tablet Medications refilled as patient requested   Other Visit Diagnoses     Frequent urination    -  Primary   Relevant Orders   POCT URINALYSIS DIP (CLINITEK) (Completed)   Other chronic pain       Relevant Medications   naproxen (NAPROSYN) 500 MG tablet   Uncomplicated opioid dependence ([place])       Abnormal urinalysis       Relevant Medications   cephALEXin (KEFLEX) 500 MG capsule Given patient's history, abx initiated    Other Relevant Orders   Urine Culture   Dysuria       Relevant Medications   cephALEXin (KEFLEX) 500 MG capsule   Follow up in 6 mths for annual wellness exam, sooner as needed    I am having Mercie Eon start on cephALEXin. I am also having her maintain her omeprazole, cetirizine, naloxone, folic acid, gabapentin, and naproxen.  Meds ordered this encounter  Medications   naproxen (NAPROSYN) 500 MG tablet    Sig: Take 1 tablet (500 mg total) by mouth 3 (three) times daily with meals as needed.    Dispense:  90 tablet    Refill:  3   cephALEXin (KEFLEX) 500 MG capsule    Sig: Take 1 capsule (500 mg total) by mouth 4 (four) times daily for 7 days.    Dispense:  28 capsule    Refill:  0     Teena Dunk, NP

## 2021-09-21 NOTE — Patient Instructions (Signed)
You were seen today in the [facility] for frequent urination and medication refill. Labs were collected, results will be available via MyChart or, if abnormal, you will be contacted by clinic staff. You were prescribed medications, please take as directed. Please follow up in 6 mths for annual wellness.

## 2021-09-21 NOTE — Progress Notes (Signed)
Reviewed PDMP substance reporting system prior to prescribing opiate medications. No inconsistencies noted.  Meds ordered this encounter  Medications   oxyCODONE (ROXICODONE) 15 MG immediate release tablet    Sig: Take 1 tablet (15 mg total) by mouth every 4 (four) hours as needed for pain.    Dispense:  90 tablet    Refill:  0    Order Specific Question:   Supervising Provider    Answer:   JEGEDE, OLUGBEMIGA E [[id number]]   oxyCODONE (OXYCONTIN) 15 mg 12 hr tablet    Sig: Take 1 tablet (15 mg total) by mouth every 12 (twelve) hours.    Dispense:  60 tablet    Refill:  0    Order Specific Question:   Supervising Provider    Answer:   JEGEDE, OLUGBEMIGA E [[id number]]   Kara [name] Kara Oluwatobi Visser  APRN, MSN, FNP-C Patient Care [facility] [address]  [place], [place] [address] [phone]  

## 2021-10-02 LAB — URINE CULTURE

## 2021-10-04 ENCOUNTER — Telehealth: Payer: Self-pay

## 2021-10-04 ENCOUNTER — Other Ambulatory Visit: Payer: Self-pay | Admitting: Family Medicine

## 2021-10-04 DIAGNOSIS — G8929 Other chronic pain: Secondary | ICD-10-CM

## 2021-10-04 DIAGNOSIS — D571 Sickle-cell disease without crisis: Secondary | ICD-10-CM

## 2021-10-04 MED ORDER — OXYCODONE HCL 15 MG PO TABS: 15.0000 mg | ORAL_TABLET | ORAL | 0 refills | Status: DC | PRN

## 2021-10-04 NOTE — Telephone Encounter (Signed)
Oxycodone 15 mg 

## 2021-10-04 NOTE — Progress Notes (Signed)
Reviewed PDMP substance reporting system prior to prescribing opiate medications. No inconsistencies noted.  Meds ordered this encounter  Medications   oxyCODONE (ROXICODONE) 15 MG immediate release tablet    Sig: Take 1 tablet (15 mg total) by mouth every 4 (four) hours as needed for pain.    Dispense:  90 tablet    Refill:  0    Order Specific Question:   Supervising Provider    Answer:   JEGEDE, OLUGBEMIGA E [[id number]]   Kara Uplinger Moore Psalm Arman  APRN, MSN, FNP-C Patient [facility] [address]  [place], [place] [address] [phone]  

## 2021-10-15 IMAGING — DX DG CHEST 1V PORT
1 series · 1 of 1 positions shown · non-contrast
Comparison: 04/28/2021

CLINICAL DATA: Cough, rhinorrhea, sickle pain

EXAM:
PORTABLE CHEST 1 VIEW

[chest ap]
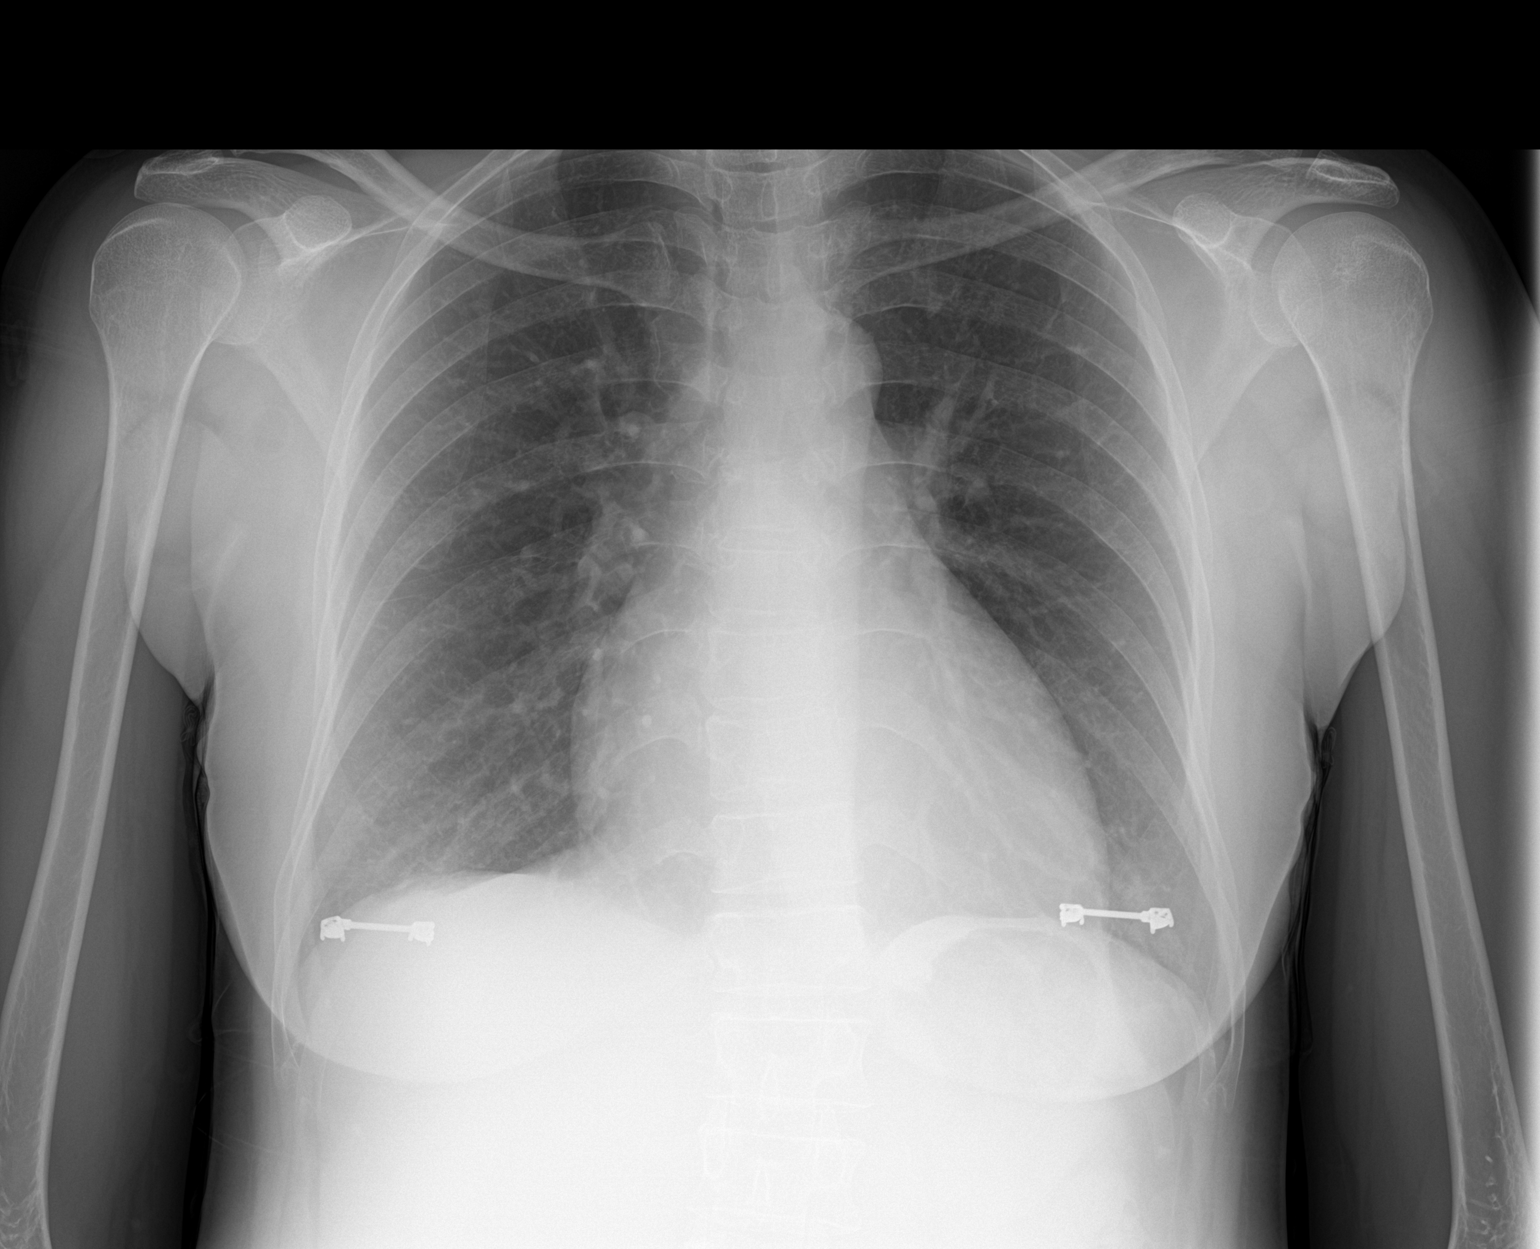

[1 of 1 positions shown; findings below may reference images not displayed]

FINDINGS: Cardiomegaly. Both lungs are clear. The visualized skeletal
structures are unremarkable.
IMPRESSION: Cardiomegaly without acute abnormality of the lungs in AP portable
projection.

## 2021-10-18 ENCOUNTER — Telehealth: Payer: Self-pay

## 2021-10-18 NOTE — Telephone Encounter (Signed)
Oxycodone Oxycodone EXTENDED

## 2021-10-19 ENCOUNTER — Other Ambulatory Visit: Payer: Self-pay | Admitting: Family Medicine

## 2021-10-19 DIAGNOSIS — G8929 Other chronic pain: Secondary | ICD-10-CM

## 2021-10-19 DIAGNOSIS — F1120 Opioid dependence, uncomplicated: Secondary | ICD-10-CM

## 2021-10-19 DIAGNOSIS — D5700 Hb-SS disease with crisis, unspecified: Secondary | ICD-10-CM

## 2021-10-19 DIAGNOSIS — D571 Sickle-cell disease without crisis: Secondary | ICD-10-CM

## 2021-10-19 MED ORDER — OXYCODONE HCL 15 MG PO TABS: 15.0000 mg | ORAL_TABLET | ORAL | 0 refills | Status: DC | PRN

## 2021-10-19 MED ORDER — OXYCODONE HCL ER 15 MG PO T12A: 15.0000 mg | EXTENDED_RELEASE_TABLET | Freq: Two times a day (BID) | ORAL | 0 refills | Status: DC

## 2021-10-19 NOTE — Progress Notes (Signed)
Reviewed PDMP substance reporting system prior to prescribing opiate medications. No inconsistencies noted.  Meds ordered this encounter  Medications   oxyCODONE (ROXICODONE) 15 MG immediate release tablet    Sig: Take 1 tablet (15 mg total) by mouth every 4 (four) hours as needed for pain.    Dispense:  90 tablet    Refill:  0    Order Specific Question:   Supervising Provider    Answer:   JEGEDE, OLUGBEMIGA E [[id number]]   oxyCODONE (OXYCONTIN) 15 mg 12 hr tablet    Sig: Take 1 tablet (15 mg total) by mouth every 12 (twelve) hours.    Dispense:  60 tablet    Refill:  0    Order Specific Question:   Supervising Provider    Answer:   JEGEDE, OLUGBEMIGA E [[id number]]   Kara Eber Moore Migel Hannis  APRN, MSN, FNP-C Patient [facility] [address]  [place], [place] [address] [phone]  

## 2021-10-21 ENCOUNTER — Other Ambulatory Visit: Payer: Self-pay | Admitting: Internal Medicine

## 2021-10-21 ENCOUNTER — Other Ambulatory Visit: Payer: Self-pay | Admitting: Nurse Practitioner

## 2021-10-21 ENCOUNTER — Other Ambulatory Visit: Payer: Self-pay | Admitting: Family Medicine

## 2021-10-21 ENCOUNTER — Telehealth: Payer: Self-pay

## 2021-10-21 DIAGNOSIS — D571 Sickle-cell disease without crisis: Secondary | ICD-10-CM

## 2021-10-21 DIAGNOSIS — G8929 Other chronic pain: Secondary | ICD-10-CM

## 2021-10-21 DIAGNOSIS — R300 Dysuria: Secondary | ICD-10-CM

## 2021-10-21 MED ORDER — OXYCODONE HCL 15 MG PO TABS: 15.0000 mg | ORAL_TABLET | ORAL | 0 refills | Status: DC | PRN

## 2021-10-21 MED ORDER — CEPHALEXIN 500 MG PO CAPS: 500.0000 mg | ORAL_CAPSULE | Freq: Four times a day (QID) | ORAL | 0 refills | Status: AC

## 2021-10-21 NOTE — Telephone Encounter (Signed)
Oxycodone Immediate Due Today per pt  Cep to be refill

## 2021-10-24 ENCOUNTER — Other Ambulatory Visit: Payer: Self-pay | Admitting: Family Medicine

## 2021-10-24 DIAGNOSIS — D571 Sickle-cell disease without crisis: Secondary | ICD-10-CM

## 2021-10-24 DIAGNOSIS — G8929 Other chronic pain: Secondary | ICD-10-CM

## 2021-10-24 MED ORDER — OXYCODONE HCL 15 MG PO TABS: 15.0000 mg | ORAL_TABLET | ORAL | 0 refills | Status: DC | PRN

## 2021-10-24 NOTE — Progress Notes (Signed)
Reviewed PDMP substance reporting system prior to prescribing opiate medications. No inconsistencies noted.  Meds ordered this encounter  Medications   DISCONTD: oxyCODONE (ROXICODONE) 15 MG immediate release tablet    Sig: Take 1 tablet (15 mg total) by mouth every 4 (four) hours as needed for pain.    Dispense:  90 tablet    Refill:  0    Order Specific Question:   Supervising Provider    Answer:   Quentin Angst [[id number]]   oxyCODONE (ROXICODONE) 15 MG immediate release tablet    Sig: Take 1 tablet (15 mg total) by mouth every 4 (four) hours as needed for pain.    Dispense:  90 tablet    Refill:  0    Order Specific Question:   Supervising Provider    Answer:   Quentin Angst [[id number]]   Rx has been resent to [facility].    Nolon Nations  APRN, MSN, FNP-C Patient Care [facility] [address] [place], [place] [address] [phone]

## 2021-10-28 ENCOUNTER — Telehealth: Payer: Self-pay

## 2021-10-28 NOTE — Telephone Encounter (Signed)
error 

## 2021-10-28 NOTE — Telephone Encounter (Signed)
Prior auth for OxyContin initiated via Covermymeds Confirmation #[id number]

## 2021-11-07 NOTE — Progress Notes (Signed)
This encounter was created in error - please disregard.

## 2021-11-08 ENCOUNTER — Telehealth: Payer: Self-pay

## 2021-11-08 NOTE — Telephone Encounter (Signed)
Oxycodone 15 mg 

## 2021-11-10 ENCOUNTER — Other Ambulatory Visit: Payer: Self-pay | Admitting: Internal Medicine

## 2021-11-10 DIAGNOSIS — G8929 Other chronic pain: Secondary | ICD-10-CM

## 2021-11-10 DIAGNOSIS — D571 Sickle-cell disease without crisis: Secondary | ICD-10-CM

## 2021-11-22 ENCOUNTER — Telehealth: Payer: Self-pay

## 2021-11-22 NOTE — Telephone Encounter (Signed)
Oxycodone  °

## 2021-12-01 ENCOUNTER — Other Ambulatory Visit: Payer: Self-pay | Admitting: Family Medicine

## 2021-12-01 ENCOUNTER — Telehealth: Payer: Self-pay

## 2021-12-01 DIAGNOSIS — D571 Sickle-cell disease without crisis: Secondary | ICD-10-CM

## 2021-12-01 DIAGNOSIS — F1120 Opioid dependence, uncomplicated: Secondary | ICD-10-CM

## 2021-12-01 DIAGNOSIS — G8929 Other chronic pain: Secondary | ICD-10-CM

## 2021-12-01 DIAGNOSIS — D5700 Hb-SS disease with crisis, unspecified: Secondary | ICD-10-CM

## 2021-12-01 MED ORDER — OXYCODONE HCL 15 MG PO TABS: 15.0000 mg | ORAL_TABLET | ORAL | 0 refills | Status: DC | PRN

## 2021-12-01 MED ORDER — OXYCODONE HCL ER 15 MG PO T12A: 15.0000 mg | EXTENDED_RELEASE_TABLET | Freq: Two times a day (BID) | ORAL | 0 refills | Status: DC

## 2021-12-01 NOTE — Progress Notes (Signed)
Reviewed PDMP substance reporting system prior to prescribing opiate medications. No inconsistencies noted.  Meds ordered this encounter  Medications   oxyCODONE (OXYCONTIN) 15 mg 12 hr tablet    Sig: Take 1 tablet (15 mg total) by mouth every 12 (twelve) hours.    Dispense:  60 tablet    Refill:  0    Order Specific Question:   Supervising Provider    Answer:   JEGEDE, OLUGBEMIGA E [[id number]]   Verniece Encarnacion Moore Shericka Johnstone  APRN, MSN, FNP-C [facility] [address]  [place], [place] [address] [phone]  

## 2021-12-01 NOTE — Telephone Encounter (Signed)
Oxycodone  °

## 2021-12-08 ENCOUNTER — Other Ambulatory Visit: Payer: Self-pay | Admitting: Family Medicine

## 2021-12-08 ENCOUNTER — Telehealth: Payer: Self-pay

## 2021-12-08 DIAGNOSIS — G8929 Other chronic pain: Secondary | ICD-10-CM

## 2021-12-08 DIAGNOSIS — D571 Sickle-cell disease without crisis: Secondary | ICD-10-CM

## 2021-12-08 MED ORDER — OXYCODONE HCL 15 MG PO TABS: 15.0000 mg | ORAL_TABLET | ORAL | 0 refills | Status: DC | PRN

## 2021-12-08 NOTE — Progress Notes (Signed)
Reviewed PDMP substance reporting system prior to prescribing opiate medications. No inconsistencies noted.  Meds ordered this encounter  Medications   oxyCODONE (ROXICODONE) 15 MG immediate release tablet    Sig: Take 1 tablet (15 mg total) by mouth every 4 (four) hours as needed for pain.    Dispense:  90 tablet    Refill:  0    Order Specific Question:   Supervising Provider    Answer:   JEGEDE, OLUGBEMIGA E [[id number]]   Kara [name] Kara Bruney  APRN, MSN, FNP-C [facility] [address]  [place],  [address] [phone]  

## 2021-12-08 NOTE — Telephone Encounter (Signed)
Oxycodone Immed.

## 2022-01-05 ENCOUNTER — Telehealth: Payer: Self-pay

## 2022-01-05 NOTE — Telephone Encounter (Signed)
Oxycodone 15 mg Oxy contin 15 mg

## 2022-01-07 ENCOUNTER — Other Ambulatory Visit: Payer: Self-pay | Admitting: Family Medicine

## 2022-01-07 DIAGNOSIS — G8929 Other chronic pain: Secondary | ICD-10-CM

## 2022-01-07 DIAGNOSIS — D5700 Hb-SS disease with crisis, unspecified: Secondary | ICD-10-CM

## 2022-01-07 DIAGNOSIS — F1120 Opioid dependence, uncomplicated: Secondary | ICD-10-CM

## 2022-01-07 DIAGNOSIS — D571 Sickle-cell disease without crisis: Secondary | ICD-10-CM

## 2022-01-07 MED ORDER — OXYCODONE HCL ER 15 MG PO T12A: 15.0000 mg | EXTENDED_RELEASE_TABLET | Freq: Two times a day (BID) | ORAL | 0 refills | Status: DC

## 2022-01-07 MED ORDER — OXYCODONE HCL 15 MG PO TABS: 15.0000 mg | ORAL_TABLET | ORAL | 0 refills | Status: DC | PRN

## 2022-01-07 NOTE — Progress Notes (Signed)
Reviewed PDMP substance reporting system prior to prescribing opiate medications. No inconsistencies noted.  Meds ordered this encounter  Medications   oxyCODONE (OXYCONTIN) 15 mg 12 hr tablet    Sig: Take 1 tablet (15 mg total) by mouth every 12 (twelve) hours.    Dispense:  60 tablet    Refill:  0    Order Specific Question:   Supervising Provider    Answer:   JEGEDE, OLUGBEMIGA E [[id number]]   oxyCODONE (ROXICODONE) 15 MG immediate release tablet    Sig: Take 1 tablet (15 mg total) by mouth every 4 (four) hours as needed for pain.    Dispense:  90 tablet    Refill:  0    Order Specific Question:   Supervising Provider    Answer:   JEGEDE, OLUGBEMIGA E [[id number]]     Kara [name] Moore Montrice Montuori  APRN, MSN, FNP-C [facility] [address]  [place], [place] [address] [phone]  

## 2022-01-09 ENCOUNTER — Other Ambulatory Visit: Payer: Self-pay

## 2022-01-09 ENCOUNTER — Encounter: Payer: Self-pay | Admitting: Nurse Practitioner

## 2022-01-09 ENCOUNTER — Ambulatory Visit (INDEPENDENT_AMBULATORY_CARE_PROVIDER_SITE_OTHER): Payer: Medicare Other | Admitting: Nurse Practitioner

## 2022-01-09 VITALS — BP 120/72 | HR 91 | Temp 97.9°F | Ht 63.0 in | Wt 127.0 lb

## 2022-01-09 DIAGNOSIS — M7989 Other specified soft tissue disorders: Secondary | ICD-10-CM

## 2022-01-09 DIAGNOSIS — Z23 Encounter for immunization: Secondary | ICD-10-CM

## 2022-01-09 DIAGNOSIS — W503XXA Accidental bite by another person, initial encounter: Secondary | ICD-10-CM

## 2022-01-09 MED ORDER — AMOXICILLIN-POT CLAVULANATE 875-125 MG PO TABS: 1.0000 | ORAL_TABLET | Freq: Two times a day (BID) | ORAL | 0 refills | Status: DC
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
## 2022-01-13 ENCOUNTER — Other Ambulatory Visit: Payer: Self-pay | Admitting: Nurse Practitioner

## 2022-01-13 ENCOUNTER — Encounter: Payer: Self-pay | Admitting: Nurse Practitioner

## 2022-01-13 DIAGNOSIS — W503XXA Accidental bite by another person, initial encounter: Secondary | ICD-10-CM

## 2022-01-13 MED ORDER — DOXYCYCLINE HYCLATE 100 MG PO TBEC: 100.0000 mg | DELAYED_RELEASE_TABLET | Freq: Two times a day (BID) | ORAL | 0 refills | Status: AC

## 2022-01-13 MED ORDER — METRONIDAZOLE 500 MG PO TABS: 500.0000 mg | ORAL_TABLET | Freq: Two times a day (BID) | ORAL | 0 refills | Status: AC

## 2022-01-23 ENCOUNTER — Other Ambulatory Visit: Payer: Self-pay | Admitting: Family Medicine

## 2022-01-23 ENCOUNTER — Telehealth: Payer: Self-pay

## 2022-01-23 DIAGNOSIS — G8929 Other chronic pain: Secondary | ICD-10-CM

## 2022-01-23 DIAGNOSIS — D571 Sickle-cell disease without crisis: Secondary | ICD-10-CM

## 2022-01-23 MED ORDER — OXYCODONE HCL 15 MG PO TABS: 15.0000 mg | ORAL_TABLET | ORAL | 0 refills | Status: DC | PRN

## 2022-01-23 NOTE — Progress Notes (Signed)
Reviewed PDMP substance reporting system prior to prescribing opiate medications. No inconsistencies noted.  Meds ordered this encounter  Medications   oxyCODONE (ROXICODONE) 15 MG immediate release tablet    Sig: Take 1 tablet (15 mg total) by mouth every 4 (four) hours as needed for pain.    Dispense:  90 tablet    Refill:  0    Order Specific Question:   Supervising Provider    Answer:   JEGEDE, OLUGBEMIGA E [[id number]]   Kara [name] Autumnrose Yore  APRN, MSN, FNP-C [facility] [address]  [place], [place] [address] [phone]  

## 2022-01-23 NOTE — Telephone Encounter (Signed)
Oxycodone 15 mg 

## 2022-02-07 ENCOUNTER — Telehealth: Payer: Self-pay

## 2022-02-07 ENCOUNTER — Other Ambulatory Visit: Payer: Self-pay | Admitting: Family Medicine

## 2022-02-07 DIAGNOSIS — D5700 Hb-SS disease with crisis, unspecified: Secondary | ICD-10-CM

## 2022-02-07 DIAGNOSIS — G8929 Other chronic pain: Secondary | ICD-10-CM

## 2022-02-07 DIAGNOSIS — F1120 Opioid dependence, uncomplicated: Secondary | ICD-10-CM

## 2022-02-07 DIAGNOSIS — D571 Sickle-cell disease without crisis: Secondary | ICD-10-CM

## 2022-02-07 MED ORDER — OXYCODONE HCL 15 MG PO TABS
15.0000 mg | ORAL_TABLET | ORAL | 0 refills | Status: DC | PRN
Start: 2022-02-07 — End: 2022-02-23

## 2022-02-07 MED ORDER — GABAPENTIN 300 MG PO CAPS: ORAL_CAPSULE | ORAL | 1 refills | Status: DC

## 2022-02-07 MED ORDER — OXYCODONE HCL ER 15 MG PO T12A: 15.0000 mg | EXTENDED_RELEASE_TABLET | Freq: Two times a day (BID) | ORAL | 0 refills | Status: DC

## 2022-02-07 NOTE — Telephone Encounter (Signed)
Oxy 15 Mg   ?Oxy contin  ?Gabapentin ?

## 2022-02-07 NOTE — Progress Notes (Signed)
Reviewed PDMP substance reporting system prior to prescribing opiate medications. No inconsistencies noted.  ?Meds ordered this encounter  ?Medications  ? oxyCODONE (OXYCONTIN) 15 mg 12 hr tablet  ?  Sig: Take 1 tablet (15 mg total) by mouth every 12 (twelve) hours.  ?  Dispense:  60 tablet  ?  Refill:  0  ?  Order Specific Question:   Supervising Provider  ?  AnswerQuentin Angst [[id number]]  ? oxyCODONE (ROXICODONE) 15 MG immediate release tablet  ?  Sig: Take 1 tablet (15 mg total) by mouth every 4 (four) hours as needed for pain.  ?  Dispense:  90 tablet  ?  Refill:  0  ?  Order Specific Question:   Supervising Provider  ?  AnswerQuentin Angst [[id number]]  ? gabapentin (NEURONTIN) 300 MG capsule  ?  Sig: TAKE 1 CAPSULE BY MOUTH THREE TIMES A DAY  ?  Dispense:  90 capsule  ?  Refill:  1  ?  CUSTOMER REQUEST.  ?  Order Specific Question:   Supervising Provider  ?  AnswerQuentin Angst [[id number]]  ?  ? ?Nolon Nations  APRN, MSN, FNP-C ?[facility] ?[facility] ?[address]  ?[place], [place] [address] ?[phone] ? ?

## 2022-02-08 ENCOUNTER — Telehealth: Payer: Self-pay | Admitting: Nurse Practitioner

## 2022-02-08 NOTE — Telephone Encounter (Signed)
Pt states antibiotics are not working. She still has numbness in finger. Yesterday she got a tingling sensation through her legs and teeth that has been on and off since then. Tingling sensation in hand still there today.  ?Pt requests [name] to give her a call.  ?

## 2022-02-13 ENCOUNTER — Other Ambulatory Visit: Payer: Self-pay | Admitting: Nurse Practitioner

## 2022-02-13 DIAGNOSIS — S6990XD Unspecified injury of unspecified wrist, hand and finger(s), subsequent encounter: Secondary | ICD-10-CM

## 2022-02-17 ENCOUNTER — Other Ambulatory Visit: Payer: Self-pay | Admitting: Nurse Practitioner

## 2022-02-17 ENCOUNTER — Telehealth: Payer: Self-pay | Admitting: Nurse Practitioner

## 2022-02-17 ENCOUNTER — Ambulatory Visit: Payer: Medicare Other | Admitting: Orthopedic Surgery

## 2022-02-17 DIAGNOSIS — N390 Urinary tract infection, site not specified: Secondary | ICD-10-CM

## 2022-02-17 MED ORDER — CEPHALEXIN 500 MG PO CAPS: 500.0000 mg | ORAL_CAPSULE | Freq: Four times a day (QID) | ORAL | 0 refills | Status: AC

## 2022-02-17 NOTE — Telephone Encounter (Signed)
Pt wondering if you can give her a call reguarding a UTI and medication being sent in.  ?

## 2022-02-22 NOTE — Telephone Encounter (Signed)
Oxycodone refill request.

## 2022-02-23 ENCOUNTER — Telehealth: Payer: Self-pay

## 2022-02-23 ENCOUNTER — Other Ambulatory Visit: Payer: Self-pay | Admitting: Family Medicine

## 2022-02-23 DIAGNOSIS — G8929 Other chronic pain: Secondary | ICD-10-CM

## 2022-02-23 DIAGNOSIS — D571 Sickle-cell disease without crisis: Secondary | ICD-10-CM

## 2022-02-23 MED ORDER — OXYCODONE HCL 15 MG PO TABS: 15.0000 mg | ORAL_TABLET | ORAL | 0 refills | Status: DC | PRN

## 2022-02-23 NOTE — Telephone Encounter (Signed)
Oxycodone  °

## 2022-02-23 NOTE — Progress Notes (Signed)
Reviewed PDMP substance reporting system prior to prescribing opiate medications. No inconsistencies noted.  Meds ordered this encounter  Medications   oxyCODONE (ROXICODONE) 15 MG immediate release tablet    Sig: Take 1 tablet (15 mg total) by mouth every 4 (four) hours as needed for pain.    Dispense:  90 tablet    Refill:  0    Order Specific Question:   Supervising Provider    Answer:   JEGEDE, OLUGBEMIGA E [[id number]]   Kara Finks Moore Boyde Grieco  APRN, MSN, FNP-C [facility] [address]  , [place] [address] [phone]  

## 2022-03-02 ENCOUNTER — Ambulatory Visit: Payer: Medicare Other | Admitting: Orthopedic Surgery

## 2022-03-08 ENCOUNTER — Telehealth: Payer: Self-pay

## 2022-03-08 NOTE — Telephone Encounter (Signed)
Oxycodone Oxy Contin 

## 2022-03-10 ENCOUNTER — Telehealth: Payer: Self-pay | Admitting: Nurse Practitioner

## 2022-03-10 ENCOUNTER — Other Ambulatory Visit: Payer: Self-pay | Admitting: Family Medicine

## 2022-03-10 DIAGNOSIS — D571 Sickle-cell disease without crisis: Secondary | ICD-10-CM

## 2022-03-10 DIAGNOSIS — D5700 Hb-SS disease with crisis, unspecified: Secondary | ICD-10-CM

## 2022-03-10 DIAGNOSIS — G8929 Other chronic pain: Secondary | ICD-10-CM

## 2022-03-10 DIAGNOSIS — F1120 Opioid dependence, uncomplicated: Secondary | ICD-10-CM

## 2022-03-10 MED ORDER — OXYCODONE HCL 15 MG PO TABS
15.0000 mg | ORAL_TABLET | ORAL | 0 refills | Status: DC | PRN
Start: 2022-03-10 — End: 2022-03-24

## 2022-03-10 MED ORDER — OXYCODONE HCL ER 15 MG PO T12A: 15.0000 mg | EXTENDED_RELEASE_TABLET | Freq: Two times a day (BID) | ORAL | 0 refills | Status: DC

## 2022-03-10 NOTE — Progress Notes (Signed)
Reviewed PDMP substance reporting system prior to prescribing opiate medications. No inconsistencies noted.  Meds ordered this encounter  Medications   oxyCODONE (ROXICODONE) 15 MG immediate release tablet    Sig: Take 1 tablet (15 mg total) by mouth every 4 (four) hours as needed for pain.    Dispense:  90 tablet    Refill:  0    Order Specific Question:   Supervising Provider    Answer:   JEGEDE, OLUGBEMIGA E [[id number]]   oxyCODONE (OXYCONTIN) 15 mg 12 hr tablet    Sig: Take 1 tablet (15 mg total) by mouth every 12 (twelve) hours.    Dispense:  60 tablet    Refill:  0    Order Specific Question:   Supervising Provider    Answer:   JEGEDE, OLUGBEMIGA E [[id number]]   Zema Lizardo Moore Zed Wanninger  APRN, MSN, FNP-C [facility] [address]  [place], [place] [address] [phone]  

## 2022-03-10 NOTE — Telephone Encounter (Signed)
Oxycodone & Oxycontin refill request  ?

## 2022-03-21 ENCOUNTER — Ambulatory Visit: Payer: Medicare Other | Admitting: Nurse Practitioner

## 2022-03-24 ENCOUNTER — Telehealth: Payer: Self-pay

## 2022-03-24 ENCOUNTER — Other Ambulatory Visit: Payer: Self-pay | Admitting: Family Medicine

## 2022-03-24 DIAGNOSIS — G8929 Other chronic pain: Secondary | ICD-10-CM

## 2022-03-24 DIAGNOSIS — D571 Sickle-cell disease without crisis: Secondary | ICD-10-CM

## 2022-03-24 MED ORDER — OXYCODONE HCL 15 MG PO TABS: 15.0000 mg | ORAL_TABLET | ORAL | 0 refills | Status: DC | PRN

## 2022-03-24 NOTE — Telephone Encounter (Signed)
Oxycodone  °

## 2022-03-24 NOTE — Progress Notes (Signed)
Reviewed PDMP substance reporting system prior to prescribing opiate medications. No inconsistencies noted.  Meds ordered this encounter  Medications   oxyCODONE (ROXICODONE) 15 MG immediate release tablet    Sig: Take 1 tablet (15 mg total) by mouth every 4 (four) hours as needed for pain.    Dispense:  90 tablet    Refill:  0    Order Specific Question:   Supervising Provider    Answer:   JEGEDE, OLUGBEMIGA E [[id number]]   Kara Rumer Moore Marlayna Bannister  APRN, MSN, FNP-C [facility] [address]  [place], [place] [address] [phone]  

## 2022-03-24 NOTE — Progress Notes (Signed)
Reviewed PDMP substance reporting system prior to prescribing opiate medications. No inconsistencies noted.  Meds ordered this encounter  Medications   oxyCODONE (ROXICODONE) 15 MG immediate release tablet    Sig: Take 1 tablet (15 mg total) by mouth every 4 (four) hours as needed for pain.    Dispense:  90 tablet    Refill:  0    Order Specific Question:   Supervising Provider    Answer:   JEGEDE, OLUGBEMIGA E [[id number]]   Kara [name] Enma Maeda  APRN, MSN, FNP-C [facility] [address]  [place], [place] [address] [phone]  

## 2022-03-27 ENCOUNTER — Ambulatory Visit (INDEPENDENT_AMBULATORY_CARE_PROVIDER_SITE_OTHER): Payer: Medicare Other | Admitting: Nurse Practitioner

## 2022-03-27 ENCOUNTER — Telehealth: Payer: Self-pay | Admitting: Nurse Practitioner

## 2022-03-27 VITALS — BP 119/77 | HR 86 | Temp 97.5°F | Ht 63.0 in | Wt 122.4 lb

## 2022-03-27 DIAGNOSIS — R21 Rash and other nonspecific skin eruption: Secondary | ICD-10-CM

## 2022-03-27 DIAGNOSIS — H9313 Tinnitus, bilateral: Secondary | ICD-10-CM | POA: Diagnosis not present

## 2022-03-27 DIAGNOSIS — H9113 Presbycusis, bilateral: Secondary | ICD-10-CM | POA: Diagnosis not present

## 2022-03-27 DIAGNOSIS — E559 Vitamin D deficiency, unspecified: Secondary | ICD-10-CM | POA: Diagnosis not present

## 2022-03-27 DIAGNOSIS — Z3009 Encounter for other general counseling and advice on contraception: Secondary | ICD-10-CM

## 2022-03-27 DIAGNOSIS — D571 Sickle-cell disease without crisis: Secondary | ICD-10-CM

## 2022-03-27 DIAGNOSIS — G8929 Other chronic pain: Secondary | ICD-10-CM

## 2022-03-27 DIAGNOSIS — Z3141 Encounter for fertility testing: Secondary | ICD-10-CM

## 2022-03-27 MED ORDER — OXYCODONE HCL 15 MG PO TABS: 15.0000 mg | ORAL_TABLET | ORAL | 0 refills | Status: DC | PRN

## 2022-03-27 NOTE — Telephone Encounter (Signed)
Oxycodone 15 mg refill request  

## 2022-03-27 NOTE — Progress Notes (Signed)
? ?[place] ?[place], [place]  [address] ?Phone:  [phone]   Fax:  [phone] ?Subjective:  ? Patient ID: Kara [name], female    DOB: Feb 05, 1986, 36 y.o.   MRN: [id number] ? ?Chief Complaint  ?Patient presents with  ? Follow-up  ?  Pt is here for follow up visit.  ? ?HPI ?Kara [name] 36 y.o. female  has a past medical history of Arthritis, Chronic migraine, GERD (gastroesophageal reflux disease), cholecystectomy (2015), Pulmonary hypertension ([place]), Sickle cell anemia ([place]), and Tendinitis. To the [facility] for reevaluation of sickle cell. ? ?Patient states that last visit, she has had increased anxiety, has restarted anxiety medication. Also concerned about ringing in the bilateral ears that only resolves with head movement. Has had for several months, but has recently worsened.  ? ?Verbalizes having increased swelling in bilateral hands and feet that occurs intermittently. Also concerned about new onset of generalized rash, suspects that it is eczema. Has been applying hydrocortisone cream to area with moderate improvement. Also has been taking allergy medicine regularly.  ? ?Patient also requesting referral to Ob/Gyn, states that she wants to have her fertility evaluated, would like to start attempting to get pregnant. Denies any abnormalities in menstrual cycle. ? ?Denies any other concerns today. Denies any fatigue, chest pain, shortness of breath, HA or dizziness. Denies any blurred vision, numbness or tingling. ? ?Past Medical History:  ?Diagnosis Date  ? Arthritis   ? Chronic migraine   ? GERD (gastroesophageal reflux disease)   ? Hx of cholecystectomy 2015  ? Pulmonary hypertension ([place])   ? Sickle cell anemia ([facility])   ? Tendinitis   ? left elbow  ? ? ?Past Surgical History:  ?Procedure Laterality Date  ? CHOLECYSTECTOMY    ? DILATION AND CURETTAGE OF UTERUS    ? GALLBLADDER SURGERY    ? ? ?Family History  ?Adopted: Yes  ? ? ?Social History   ? ?Socioeconomic History  ? Marital status: Single  ?  Spouse name: Not on file  ? Number of children: Not on file  ? Years of education: Not on file  ? Highest education level: Not on file  ?Occupational History  ? Not on file  ?Tobacco Use  ? Smoking status: Every Day  ?  Packs/day: 0.50  ?  Types: Cigarettes  ? Smokeless tobacco: Never  ?Vaping Use  ? Vaping Use: Never used  ?Substance and Sexual Activity  ? Alcohol use: No  ? Drug use: Not Currently  ?  Types: Marijuana  ? Sexual activity: Yes  ?  Birth control/protection: None  ?Other Topics Concern  ? Not on file  ?Social History Narrative  ? Not on file  ? ?Social Determinants of Health  ? ?Financial Resource Strain: High Risk  ? Difficulty of Paying Living Expenses: Very hard  ?Food Insecurity: No Food Insecurity  ? Worried About Charity fundraiser in the Last Year: Never true  ? Ran Out of Food in the Last Year: Never true  ?Transportation Needs: No Transportation Needs  ? Lack of Transportation (Medical): No  ? Lack of Transportation (Non-Medical): No  ?Physical Activity: Inactive  ? Days of Exercise per Week: 0 days  ? Minutes of Exercise per Session: 0 min  ?Stress: Stress Concern Present  ? Feeling of Stress : To some extent  ?Social Connections: Moderately Isolated  ? Frequency of Communication with Friends and Family: More than three times a week  ?  Frequency of Social Gatherings with Friends and Family: Never  ? Attends Religious Services: Never  ? Active Member of Clubs or Organizations: Yes  ? Attends Archivist Meetings: More than 4 times per year  ? Marital Status: Never married  ?Intimate Partner Violence: Not on file  ? ? ?Outpatient Medications Prior to Visit  ?Medication Sig Dispense Refill  ? cetirizine (ZYRTEC) 10 MG tablet Take 1 tablet (10 mg total) by mouth daily. (Patient taking differently: Take 10 mg by mouth daily as needed for allergies.) 30 tablet 11  ? folic acid (FOLVITE) 1 MG tablet Take 1 tablet (1 mg total) by  mouth daily. 30 tablet 11  ? gabapentin (NEURONTIN) 300 MG capsule TAKE 1 CAPSULE BY MOUTH THREE TIMES A DAY 90 capsule 1  ? naloxone (NARCAN) nasal spray 4 mg/0.1 mL 1 spray in nostril, may use every 2-3 minutes in alternating nostrils while awaiting medical assistance (Patient not taking: Reported on 01/09/2022) 1 each 1  ? naproxen (NAPROSYN) 500 MG tablet Take 1 tablet (500 mg total) by mouth 3 (three) times daily with meals as needed. 90 tablet 3  ? omeprazole (PRILOSEC) 40 MG capsule Take 1 capsule (40 mg total) by mouth daily. 30 capsule 11  ? oxyCODONE (OXYCONTIN) 15 mg 12 hr tablet Take 1 tablet (15 mg total) by mouth every 12 (twelve) hours. 60 tablet 0  ? oxyCODONE (ROXICODONE) 15 MG immediate release tablet Take 1 tablet (15 mg total) by mouth every 4 (four) hours as needed for pain. 90 tablet 0  ? ?No facility-administered medications prior to visit.  ? ? ?Allergies  ?Allergen Reactions  ? Augmentin [Amoxicillin-Pot Clavulanate] Diarrhea and Nausea And Vomiting  ? Kiwi Extract Hives  ? Morphine And Related Hives  ? Paroxetine Hives  ? ? ?Review of Systems  ?Constitutional:  Negative for chills, fever and malaise/fatigue.  ?HENT:  Positive for tinnitus. Negative for congestion, ear discharge, ear pain, hearing loss, nosebleeds, sinus pain and sore throat.   ?Eyes: Negative.   ?Respiratory:  Negative for cough, shortness of breath and stridor.   ?Cardiovascular:  Positive for leg swelling. Negative for chest pain and palpitations.  ?Gastrointestinal:  Negative for abdominal pain, blood in stool, constipation, diarrhea, nausea and vomiting.  ?Musculoskeletal: Negative.   ?Skin:  Positive for itching and rash.  ?Neurological: Negative.   ?Psychiatric/Behavioral:  Negative for depression. The patient is nervous/anxious.   ?All other systems reviewed and are negative. ? ?   ?Objective:  ?  ?Physical Exam ?Vitals reviewed.  ?Constitutional:   ?   General: She is not in acute distress. ?   Appearance: Normal  appearance. She is normal weight.  ?HENT:  ?   Head: Normocephalic.  ?   Right Ear: Tympanic membrane, ear canal and external ear normal. There is no impacted cerumen.  ?   Left Ear: Tympanic membrane, ear canal and external ear normal. There is no impacted cerumen.  ?   Nose: Nose normal. No congestion or rhinorrhea.  ?   Mouth/Throat:  ?   Mouth: Mucous membranes are dry.  ?   Pharynx: Oropharynx is clear. No oropharyngeal exudate or posterior oropharyngeal erythema.  ?Eyes:  ?   General: No scleral icterus.    ?   Right eye: No discharge.     ?   Left eye: No discharge.  ?   Extraocular Movements: Extraocular movements intact.  ?   Conjunctiva/sclera: Conjunctivae normal.  ?   Pupils: Pupils are equal,  round, and reactive to light.  ?Neck:  ?   Vascular: No carotid bruit.  ?Cardiovascular:  ?   Rate and Rhythm: Normal rate and regular rhythm.  ?   Pulses: Normal pulses.  ?   Heart sounds: Normal heart sounds.  ?   Comments: No obvious peripheral edema ?Pulmonary:  ?   Effort: Pulmonary effort is normal.  ?   Breath sounds: Normal breath sounds.  ?Musculoskeletal:     ?   General: No swelling, tenderness, deformity or signs of injury. Normal range of motion.  ?   Cervical back: Normal range of motion and neck supple. No rigidity or tenderness.  ?   Right lower leg: No edema.  ?   Left lower leg: No edema.  ?Lymphadenopathy:  ?   Cervical: No cervical adenopathy.  ?Skin: ?   General: Skin is warm and dry.  ?   Capillary Refill: Capillary refill takes less than 2 seconds.  ?   Coloration: Skin is not jaundiced or pale.  ?   Findings: No bruising, erythema, lesion or rash.  ?Neurological:  ?   General: No focal deficit present.  ?   Mental Status: She is alert and oriented to person, place, and time.  ?Psychiatric:     ?   Mood and Affect: Mood normal.     ?   Behavior: Behavior normal.     ?   Thought Content: Thought content normal.     ?   Judgment: Judgment normal.  ? ? ?BP 119/77 (BP Location: Left Arm, Patient  Position: Sitting, Cuff Size: Normal)   Pulse 86   Temp (!) 97.5 ?F (36.4 ?C)   Ht 5\' 3"  (1.6 m)   Wt 122 lb 6 oz (55.5 kg)   SpO2 100%   BMI 21.68 kg/m?  ?Wt Readings from Last 3 Encounters:  ?03/27/22 122 lb 6 oz (5

## 2022-03-27 NOTE — Patient Instructions (Signed)
You were seen today in the [facility] for reevaluation of sickle cell, tinnitus and rash. Labs were collected, results will be available via MyChart or, if abnormal, you will be contacted by clinic staff. You were prescribed medications, please take as directed. Please follow up in 3 mths  for reevaluation.  ?

## 2022-03-28 ENCOUNTER — Other Ambulatory Visit: Payer: Self-pay | Admitting: Nurse Practitioner

## 2022-03-28 DIAGNOSIS — E559 Vitamin D deficiency, unspecified: Secondary | ICD-10-CM

## 2022-03-28 LAB — COMPREHENSIVE METABOLIC PANEL
ALT: 13 IU/L (ref 0–32)
AST: 28 IU/L (ref 0–40)
Albumin/Globulin Ratio: 1.6 (ref 1.2–2.2)
Albumin: 4.9 g/dL — ABNORMAL HIGH (ref 3.8–4.8)
Alkaline Phosphatase: 61 IU/L (ref 44–121)
BUN/Creatinine Ratio: 10 (ref 9–23)
BUN: 6 mg/dL (ref 6–20)
Bilirubin Total: 4.6 mg/dL — ABNORMAL HIGH (ref 0.0–1.2)
CO2: 20 mmol/L (ref 20–29)
Calcium: 9.5 mg/dL (ref 8.7–10.2)
Chloride: 103 mmol/L (ref 96–106)
Creatinine, Ser: 0.63 mg/dL (ref 0.57–1.00)
Globulin, Total: 3 g/dL (ref 1.5–4.5)
Glucose: 95 mg/dL (ref 70–99)
Potassium: 4 mmol/L (ref 3.5–5.2)
Sodium: 143 mmol/L (ref 134–144)
Total Protein: 7.9 g/dL (ref 6.0–8.5)
eGFR: 119 mL/min/{1.73_m2} (ref >59)

## 2022-03-28 LAB — CBC WITH DIFFERENTIAL/PLATELET
Basophils Absolute: 0.1 10*3/uL (ref 0.0–0.2)
Basos: 1 %
EOS (ABSOLUTE): 0.4 10*3/uL (ref 0.0–0.4)
Eos: 3 %
Hematocrit: 25.4 % — ABNORMAL LOW (ref 34.0–46.6)
Hemoglobin: 8.6 g/dL — ABNORMAL LOW (ref 11.1–15.9)
Immature Grans (Abs): 0.2 10*3/uL — ABNORMAL HIGH (ref 0.0–0.1)
Immature Granulocytes: 1 %
Lymphocytes Absolute: 7.7 10*3/uL — ABNORMAL HIGH (ref 0.7–3.1)
Lymphs: 47 %
MCH: 35.7 pg — ABNORMAL HIGH (ref 26.6–33.0)
MCHC: 33.9 g/dL (ref 31.5–35.7)
MCV: 105 fL — ABNORMAL HIGH (ref 79–97)
Monocytes Absolute: 1.4 10*3/uL — ABNORMAL HIGH (ref 0.1–0.9)
Monocytes: 8 %
NRBC: 8 % — ABNORMAL HIGH (ref 0–0)
Neutrophils Absolute: 6.5 10*3/uL (ref 1.4–7.0)
Neutrophils: 40 %
Platelets: 359 10*3/uL (ref 150–450)
RBC: 2.41 x10E6/uL — CL (ref 3.77–5.28)
RDW: 20.2 % — ABNORMAL HIGH (ref 11.7–15.4)
WBC: 16.3 10*3/uL — ABNORMAL HIGH (ref 3.4–10.8)

## 2022-03-28 LAB — VITAMIN D 25 HYDROXY (VIT D DEFICIENCY, FRACTURES): Vit D, 25-Hydroxy: 11.6 ng/mL — ABNORMAL LOW (ref 30.0–100.0)

## 2022-03-28 LAB — FOLATE: Folate: 12.8 ng/mL (ref >3.0)

## 2022-03-28 MED ORDER — VITAMIN D (ERGOCALCIFEROL) 1.25 MG (50000 UNIT) PO CAPS: 50000.0000 [IU] | ORAL_CAPSULE | ORAL | 0 refills | Status: AC

## 2022-03-29 ENCOUNTER — Ambulatory Visit: Payer: Medicare Other | Admitting: Nurse Practitioner

## 2022-03-29 ENCOUNTER — Encounter: Payer: Self-pay | Admitting: Nurse Practitioner

## 2022-03-30 ENCOUNTER — Ambulatory Visit (INDEPENDENT_AMBULATORY_CARE_PROVIDER_SITE_OTHER): Payer: Medicare Other | Admitting: Nurse Practitioner

## 2022-03-30 DIAGNOSIS — Z0000 Encounter for general adult medical examination without abnormal findings: Secondary | ICD-10-CM | POA: Diagnosis not present

## 2022-03-30 NOTE — Progress Notes (Signed)
? ?Subjective:  ? Kara [name] is a 36 y.o. female who presents for an Initial Medicare Annual Wellness Visit. ? ?I connected with  Kara [name] on 03/30/22 by a  telephone  enabled telemedicine application and verified that I am speaking with the correct person using two identifiers. ? ?Patient Location: Home ? ?Provider Location: Office/Clinic ? ?I discussed the limitations of evaluation and management by telemedicine. The patient expressed understanding and agreed to proceed.  ? ?Review of Systems    ? ?  ? ?   ?Objective:  ?  ?There were no vitals filed for this visit. ?There is no height or weight on file to calculate BMI. ? ? ?  08/26/2021  ?  4:36 PM 08/15/2021  ?  4:28 AM 06/21/2021  ? 12:57 PM 04/28/2021  ? 11:34 AM 04/17/2021  ?  3:13 AM 04/16/2021  ? 11:05 PM 04/12/2021  ?  2:46 PM  ?Advanced Directives  ?Does Patient Have a Medical Advance Directive? No No No No  No No  ?Would patient like information on creating a medical advance directive? No - Patient declined No - Patient declined No - Patient declined No - Patient declined No - Patient declined    ? ? ?Current Medications (verified) ?Outpatient Encounter Medications as of 03/30/2022  ?Medication Sig  ? cetirizine (ZYRTEC) 10 MG tablet Take 1 tablet (10 mg total) by mouth daily. (Patient taking differently: Take 10 mg by mouth daily as needed for allergies.)  ? folic acid (FOLVITE) 1 MG tablet Take 1 tablet (1 mg total) by mouth daily.  ? gabapentin (NEURONTIN) 300 MG capsule TAKE 1 CAPSULE BY MOUTH THREE TIMES A DAY  ? naloxone (NARCAN) nasal spray 4 mg/0.1 mL 1 spray in nostril, may use every 2-3 minutes in alternating nostrils while awaiting medical assistance (Patient not taking: Reported on 01/09/2022)  ? naproxen (NAPROSYN) 500 MG tablet Take 1 tablet (500 mg total) by mouth 3 (three) times daily with meals as needed.  ? omeprazole (PRILOSEC) 40 MG capsule Take 1 capsule (40 mg total) by mouth daily.  ? oxyCODONE (OXYCONTIN) 15 mg 12 hr  tablet Take 1 tablet (15 mg total) by mouth every 12 (twelve) hours.  ? oxyCODONE (ROXICODONE) 15 MG immediate release tablet Take 1 tablet (15 mg total) by mouth every 4 (four) hours as needed for up to 14 days for pain.  ? Vitamin D, Ergocalciferol, (DRISDOL) 1.25 MG (50000 UNIT) CAPS capsule Take 1 capsule (50,000 Units total) by mouth every 7 (seven) days.  ? ?No facility-administered encounter medications on file as of 03/30/2022.  ? ? ?Allergies (verified) ?Augmentin [amoxicillin-pot clavulanate], Kiwi extract, Morphine and related, and Paroxetine  ? ?History: ?Past Medical History:  ?Diagnosis Date  ? Arthritis   ? Chronic migraine   ? GERD (gastroesophageal reflux disease)   ? Hx of cholecystectomy 2015  ? Pulmonary hypertension ([place])   ? Sickle cell anemia ([facility])   ? Tendinitis   ? left elbow  ? ?Past Surgical History:  ?Procedure Laterality Date  ? CHOLECYSTECTOMY    ? DILATION AND CURETTAGE OF UTERUS    ? GALLBLADDER SURGERY    ? ?Family History  ?Adopted: Yes  ? ?Social History  ? ?Socioeconomic History  ? Marital status: Single  ?  Spouse name: Not on file  ? Number of children: Not on file  ? Years of education: Not on file  ? Highest education level: Not on file  ?Occupational History  ? Not on  file  ?Tobacco Use  ? Smoking status: Every Day  ?  Packs/day: 0.50  ?  Types: Cigarettes  ? Smokeless tobacco: Never  ?Vaping Use  ? Vaping Use: Never used  ?Substance and Sexual Activity  ? Alcohol use: No  ? Drug use: Not Currently  ?  Types: Marijuana  ? Sexual activity: Yes  ?  Birth control/protection: None  ?Other Topics Concern  ? Not on file  ?Social History Narrative  ? Not on file  ? ?Social Determinants of Health  ? ?Financial Resource Strain: High Risk  ? Difficulty of Paying Living Expenses: Very hard  ?Food Insecurity: No Food Insecurity  ? Worried About Charity fundraiser in the Last Year: Never true  ? Ran Out of Food in the Last Year: Never true  ?Transportation Needs: No Transportation Needs   ? Lack of Transportation (Medical): No  ? Lack of Transportation (Non-Medical): No  ?Physical Activity: Inactive  ? Days of Exercise per Week: 0 days  ? Minutes of Exercise per Session: 0 min  ?Stress: Stress Concern Present  ? Feeling of Stress : To some extent  ?Social Connections: Moderately Isolated  ? Frequency of Communication with Friends and Family: More than three times a week  ? Frequency of Social Gatherings with Friends and Family: Never  ? Attends Religious Services: Never  ? Active Member of Clubs or Organizations: Yes  ? Attends Archivist Meetings: More than 4 times per year  ? Marital Status: Never married  ? ? ?Tobacco Counseling ?Ready to quit: Not Answered ?Counseling given: Not Answered ? ? ?Clinical Intake: ? ?  ? ?  ? ?  ? ?  ? ?  ? ?Diabetic? Patient is not a diabetic ? ?  ? ?  ? ? ?Activities of Daily Living ? ?  08/15/2021  ? 10:27 AM 08/15/2021  ? 10:26 AM  ?In your present state of health, do you have any difficulty performing the following activities:  ?Hearing?  0  ?Vision?  0  ?Difficulty concentrating or making decisions?  0  ?Walking or climbing stairs?  1  ?Comment  secondary to back and hip pain  ?Dressing or bathing?  0  ?Doing errands, shopping? 0   ? ? ?Patient Care Team: ?Teena Dunk, NP as PCP - General (Nurse Practitioner) ? ?Indicate any recent Medical Services you may have received from other than [facility] providers in the past year (date may be approximate). ? ?   ?Assessment:  ? This is a routine wellness examination for [facility]. ? ?Hearing/Vision screen ?No results found. ? ?Dietary issues and exercise activities discussed: ?  ? ? Goals Addressed   ?None ?  ?Depression Screen ? ?  05/18/2021  ?  2:15 PM 08/27/2020  ?  1:48 PM 07/19/2020  ?  3:42 PM 07/14/2019  ?  9:42 AM 04/04/2018  ?  2:53 PM  ?PHQ 2/9 Scores  ?PHQ - 2 Score 0 1 2 3 4   ?PHQ- 9 Score    10 15  ?Exception Documentation   Medical reason    ?  ?Fall Risk ? ?  01/09/2022  ?  1:30 PM 05/18/2021  ?  2:26  PM 12/01/2020  ?  9:44 AM 08/27/2020  ?  1:48 PM 07/19/2020  ?  3:41 PM  ?Fall Risk   ?Falls in the past year? 0 0 0 0   ?Comment     pt states she doesn't remeber.  ?Number falls in past  yr:  0  0   ?Injury with Fall?  0  0   ?Risk for fall due to :  No Fall Risks  No Fall Risks   ?Follow up  Education provided Falls evaluation completed    ? ? ?FALL RISK PREVENTION PERTAINING TO THE HOME: ? ?Any stairs in or around the home? No  ?If so, are there any without handrails? No  ?Home free of loose throw rugs in walkways, pet beds, electrical cords, etc? Yes  ?Adequate lighting in your home to reduce risk of falls? Yes  ? ?ASSISTIVE DEVICES UTILIZED TO PREVENT FALLS: ? ?Life alert? No  ?Use of a cane, walker or w/c? No  ?Grab bars in the bathroom? No  ?Shower chair or bench in shower? No  ?Elevated toilet seat or a handicapped toilet? No  ? ?TIMED UP AND GO: ? ?Was the test performed? No .  ?Length of time to ambulate 10 feet: 0 sec.  ? ? ? ?Cognitive Function: ? ?  05/18/2021  ?  2:27 PM  ?MMSE - Mini Mental State Exam  ?Orientation to time 5  ?Orientation to Place 5  ?Registration 3  ?Attention/ Calculation 5  ?Recall 3  ?Language- name 2 objects 2  ?Language- repeat 1  ?Language- follow 3 step command 3  ?Language- read & follow direction 1  ?Write a sentence 1  ?Copy design 1  ?Total score 30  ? ?  ?  ? ?Immunizations ?Immunization History  ?Administered Date(s) Administered  ? Influenza Split 09/18/2014  ? Tdap 01/09/2022  ? ? ?TDAP status: Up to date ? ?Flu Vaccine status: Due, Education has been provided regarding the importance of this vaccine. Advised may receive this vaccine at local pharmacy or Health Dept. Aware to provide a copy of the vaccination record if obtained from local pharmacy or Health Dept. Verbalized acceptance and understanding. ? ?Pneumococcal vaccine status: Due, Education has been provided regarding the importance of this vaccine. Advised may receive this vaccine at local pharmacy or Health  Dept. Aware to provide a copy of the vaccination record if obtained from local pharmacy or Health Dept. Verbalized acceptance and understanding. ? ?Covid-19 vaccine status: Declined, Education has been provide

## 2022-04-07 ENCOUNTER — Telehealth: Payer: Self-pay

## 2022-04-07 ENCOUNTER — Other Ambulatory Visit: Payer: Self-pay | Admitting: Nurse Practitioner

## 2022-04-07 DIAGNOSIS — K219 Gastro-esophageal reflux disease without esophagitis: Secondary | ICD-10-CM

## 2022-04-07 DIAGNOSIS — J302 Other seasonal allergic rhinitis: Secondary | ICD-10-CM

## 2022-04-07 MED ORDER — CETIRIZINE HCL 10 MG PO TABS: 10.0000 mg | ORAL_TABLET | Freq: Every day | ORAL | 11 refills | Status: AC

## 2022-04-07 MED ORDER — OMEPRAZOLE 40 MG PO CPDR: 40.0000 mg | DELAYED_RELEASE_CAPSULE | Freq: Every day | ORAL | 11 refills | Status: DC

## 2022-04-07 NOTE — Telephone Encounter (Signed)
Oxycodone  Oxy contin Allergies med Omeprazole

## 2022-04-10 ENCOUNTER — Other Ambulatory Visit: Payer: Self-pay | Admitting: Family Medicine

## 2022-04-10 ENCOUNTER — Telehealth: Payer: Self-pay | Admitting: Nurse Practitioner

## 2022-04-10 DIAGNOSIS — D571 Sickle-cell disease without crisis: Secondary | ICD-10-CM

## 2022-04-10 DIAGNOSIS — G8929 Other chronic pain: Secondary | ICD-10-CM

## 2022-04-10 DIAGNOSIS — D5700 Hb-SS disease with crisis, unspecified: Secondary | ICD-10-CM

## 2022-04-10 DIAGNOSIS — F1120 Opioid dependence, uncomplicated: Secondary | ICD-10-CM

## 2022-04-10 MED ORDER — OXYCODONE HCL ER 15 MG PO T12A: 15.0000 mg | EXTENDED_RELEASE_TABLET | Freq: Two times a day (BID) | ORAL | 0 refills | Status: DC

## 2022-04-10 MED ORDER — OXYCODONE HCL 15 MG PO TABS: 15.0000 mg | ORAL_TABLET | ORAL | 0 refills | Status: DC | PRN

## 2022-04-10 NOTE — Telephone Encounter (Signed)
Oxycodone  Oxy contin Refill request

## 2022-04-10 NOTE — Progress Notes (Signed)
Reviewed PDMP substance reporting system prior to prescribing opiate medications. No inconsistencies noted.  Meds ordered this encounter  Medications   oxyCODONE (ROXICODONE) 15 MG immediate release tablet    Sig: Take 1 tablet (15 mg total) by mouth every 4 (four) hours as needed for up to 14 days for pain.    Dispense:  90 tablet    Refill:  0    Order Specific Question:   Supervising Provider    Answer:   Quentin Angst [[id number]]   oxyCODONE (OXYCONTIN) 15 mg 12 hr tablet    Sig: Take 1 tablet (15 mg total) by mouth every 12 (twelve) hours.    Dispense:  60 tablet    Refill:  0    Order Specific Question:   Supervising Provider    Answer:   Quentin Angst [[id number]]   Nolon Nations  APRN, MSN, FNP-C Patient Care [facility] [address] [place], [place] [address] [phone]

## 2022-04-11 ENCOUNTER — Inpatient Hospital Stay (HOSPITAL_COMMUNITY)
Admission: EM | Admit: 2022-04-11 | Discharge: 2022-04-19 | DRG: 812 | Disposition: A | Discharge status: Home / Self Care | Payer: Medicare Other | Attending: Internal Medicine | Admitting: Internal Medicine

## 2022-04-11 ENCOUNTER — Telehealth: Payer: Self-pay

## 2022-04-11 ENCOUNTER — Encounter: Payer: Self-pay | Admitting: Nurse Practitioner

## 2022-04-11 ENCOUNTER — Other Ambulatory Visit: Payer: Self-pay | Admitting: Family Medicine

## 2022-04-11 ENCOUNTER — Encounter (HOSPITAL_COMMUNITY): Payer: Self-pay | Admitting: Pharmacy Technician

## 2022-04-11 ENCOUNTER — Ambulatory Visit (INDEPENDENT_AMBULATORY_CARE_PROVIDER_SITE_OTHER): Payer: Medicare Other | Admitting: Nurse Practitioner

## 2022-04-11 ENCOUNTER — Other Ambulatory Visit: Payer: Self-pay

## 2022-04-11 VITALS — BP 140/68 | HR 79 | Temp 97.5°F | Ht 63.0 in | Wt 121.2 lb

## 2022-04-11 DIAGNOSIS — G894 Chronic pain syndrome: Secondary | ICD-10-CM | POA: Diagnosis present

## 2022-04-11 DIAGNOSIS — L309 Dermatitis, unspecified: Secondary | ICD-10-CM | POA: Diagnosis not present

## 2022-04-11 DIAGNOSIS — D5700 Hb-SS disease with crisis, unspecified: Principal | ICD-10-CM | POA: Diagnosis present

## 2022-04-11 DIAGNOSIS — K219 Gastro-esophageal reflux disease without esophagitis: Secondary | ICD-10-CM | POA: Diagnosis not present

## 2022-04-11 DIAGNOSIS — F1120 Opioid dependence, uncomplicated: Secondary | ICD-10-CM

## 2022-04-11 DIAGNOSIS — F17210 Nicotine dependence, cigarettes, uncomplicated: Secondary | ICD-10-CM | POA: Diagnosis present

## 2022-04-11 DIAGNOSIS — L509 Urticaria, unspecified: Secondary | ICD-10-CM

## 2022-04-11 DIAGNOSIS — D638 Anemia in other chronic diseases classified elsewhere: Secondary | ICD-10-CM | POA: Diagnosis present

## 2022-04-11 DIAGNOSIS — G8929 Other chronic pain: Secondary | ICD-10-CM

## 2022-04-11 DIAGNOSIS — D571 Sickle-cell disease without crisis: Secondary | ICD-10-CM

## 2022-04-11 DIAGNOSIS — Z885 Allergy status to narcotic agent status: Secondary | ICD-10-CM

## 2022-04-11 DIAGNOSIS — Z79899 Other long term (current) drug therapy: Secondary | ICD-10-CM

## 2022-04-11 DIAGNOSIS — I272 Pulmonary hypertension, unspecified: Secondary | ICD-10-CM | POA: Diagnosis present

## 2022-04-11 DIAGNOSIS — R21 Rash and other nonspecific skin eruption: Secondary | ICD-10-CM | POA: Diagnosis not present

## 2022-04-11 DIAGNOSIS — Z880 Allergy status to penicillin: Secondary | ICD-10-CM

## 2022-04-11 DIAGNOSIS — Z888 Allergy status to other drugs, medicaments and biological substances status: Secondary | ICD-10-CM

## 2022-04-11 LAB — CBC WITH DIFFERENTIAL/PLATELET
Abs Immature Granulocytes: 0 10*3/uL (ref 0.00–0.07)
Basophils Absolute: 0 10*3/uL (ref 0.0–0.1)
Basophils Relative: 0 %
Eosinophils Absolute: 0 10*3/uL (ref 0.0–0.5)
Eosinophils Relative: 0 %
HCT: 23.9 % — ABNORMAL LOW (ref 36.0–46.0)
Hemoglobin: 8.5 g/dL — ABNORMAL LOW (ref 12.0–15.0)
Lymphocytes Relative: 30 %
Lymphs Abs: 4.8 10*3/uL — ABNORMAL HIGH (ref 0.7–4.0)
MCH: 36 pg — ABNORMAL HIGH (ref 26.0–34.0)
MCHC: 35.6 g/dL (ref 30.0–36.0)
MCV: 101.3 fL — ABNORMAL HIGH (ref 80.0–100.0)
Monocytes Absolute: 0.5 10*3/uL (ref 0.1–1.0)
Monocytes Relative: 3 %
Neutro Abs: 10.8 10*3/uL — ABNORMAL HIGH (ref 1.7–7.7)
Neutrophils Relative %: 67 %
Platelets: 446 10*3/uL — ABNORMAL HIGH (ref 150–400)
RBC: 2.36 MIL/uL — ABNORMAL LOW (ref 3.87–5.11)
RDW: 22.5 % — ABNORMAL HIGH (ref 11.5–15.5)
WBC: 16.1 10*3/uL — ABNORMAL HIGH (ref 4.0–10.5)
nRBC: 2.7 % — ABNORMAL HIGH (ref 0.0–0.2)

## 2022-04-11 LAB — URINALYSIS, ROUTINE W REFLEX MICROSCOPIC
Bacteria, UA: NONE SEEN
Bilirubin Urine: NEGATIVE
Glucose, UA: NEGATIVE mg/dL
Ketones, ur: NEGATIVE mg/dL
Nitrite: NEGATIVE
Protein, ur: NEGATIVE mg/dL
Specific Gravity, Urine: 1.005 (ref 1.005–1.030)
pH: 6 (ref 5.0–8.0)

## 2022-04-11 LAB — COMPREHENSIVE METABOLIC PANEL
ALT: 17 U/L (ref 0–44)
AST: 31 U/L (ref 15–41)
Albumin: 5 g/dL (ref 3.5–5.0)
Alkaline Phosphatase: 54 U/L (ref 38–126)
Anion gap: 9 (ref 5–15)
BUN: 8 mg/dL (ref 6–20)
CO2: 24 mmol/L (ref 22–32)
Calcium: 9.5 mg/dL (ref 8.9–10.3)
Chloride: 104 mmol/L (ref 98–111)
Creatinine, Ser: 0.56 mg/dL (ref 0.44–1.00)
GFR, Estimated: >60 mL/min (ref >60)
Glucose, Bld: 98 mg/dL (ref 70–99)
Potassium: 3.9 mmol/L (ref 3.5–5.1)
Sodium: 137 mmol/L (ref 135–145)
Total Bilirubin: 6 mg/dL — ABNORMAL HIGH (ref 0.3–1.2)
Total Protein: 9.1 g/dL — ABNORMAL HIGH (ref 6.5–8.1)

## 2022-04-11 LAB — RETICULOCYTES
Immature Retic Fract: 46.2 % — ABNORMAL HIGH (ref 2.3–15.9)
RBC.: 2.33 MIL/uL — ABNORMAL LOW (ref 3.87–5.11)
Retic Count, Absolute: 568.5 10*3/uL — ABNORMAL HIGH (ref 19.0–186.0)
Retic Ct Pct: 22.3 % — ABNORMAL HIGH (ref 0.4–3.1)

## 2022-04-11 LAB — I-STAT BETA HCG BLOOD, ED (MC, WL, AP ONLY): I-stat hCG, quantitative: <5 m[IU]/mL (ref <5)

## 2022-04-11 MED ORDER — SODIUM CHLORIDE 0.9% FLUSH: 9.0000 mL | INTRAVENOUS | Status: DC | PRN

## 2022-04-11 MED ORDER — ONDANSETRON HCL 4 MG/2ML IJ SOLN
4.0000 mg | INTRAMUSCULAR | Status: DC | PRN
  Administered 2022-04-11: 4 mg via INTRAVENOUS
  Filled 2022-04-11: qty 2

## 2022-04-11 MED ORDER — LORATADINE 10 MG PO TABS
10.0000 mg | ORAL_TABLET | Freq: Every day | ORAL | Status: DC
  Administered 2022-04-11 – 2022-04-19 (×9): 10 mg via ORAL
  Filled 2022-04-11 (×9): qty 1

## 2022-04-11 MED ORDER — PANTOPRAZOLE SODIUM 40 MG PO TBEC
40.0000 mg | DELAYED_RELEASE_TABLET | Freq: Every day | ORAL | Status: DC
Start: 2022-04-11 — End: 2022-04-15
  Administered 2022-04-11 – 2022-04-12 (×2): 40 mg via ORAL
  Filled 2022-04-11 (×4): qty 1

## 2022-04-11 MED ORDER — NALOXONE HCL 0.4 MG/ML IJ SOLN: 0.4000 mg | INTRAMUSCULAR | Status: DC | PRN

## 2022-04-11 MED ORDER — FOLIC ACID 1 MG PO TABS: 1.0000 mg | ORAL_TABLET | Freq: Every day | ORAL | 11 refills | Status: DC

## 2022-04-11 MED ORDER — OXYCODONE HCL 15 MG PO TABS: 15.0000 mg | ORAL_TABLET | ORAL | 0 refills | Status: DC | PRN

## 2022-04-11 MED ORDER — OXYCODONE HCL ER 15 MG PO T12A: 15.0000 mg | EXTENDED_RELEASE_TABLET | Freq: Two times a day (BID) | ORAL | 0 refills | Status: DC

## 2022-04-11 MED ORDER — FOLIC ACID 1 MG PO TABS
1.0000 mg | ORAL_TABLET | Freq: Every day | ORAL | Status: DC
Start: 2022-04-12 — End: 2022-04-19
  Administered 2022-04-12 – 2022-04-19 (×8): 1 mg via ORAL
  Filled 2022-04-11 (×8): qty 1

## 2022-04-11 MED ORDER — HYDROXYZINE HCL 25 MG PO TABS
25.0000 mg | ORAL_TABLET | Freq: Three times a day (TID) | ORAL | Status: DC | PRN
  Administered 2022-04-12 – 2022-04-18 (×5): 25 mg via ORAL
  Filled 2022-04-11 (×5): qty 1

## 2022-04-11 MED ORDER — SODIUM CHLORIDE 0.45 % IV SOLN: INTRAVENOUS | Status: DC

## 2022-04-11 MED ORDER — DIPHENHYDRAMINE HCL 25 MG PO CAPS: 25.0000 mg | ORAL_CAPSULE | ORAL | Status: DC | PRN

## 2022-04-11 MED ORDER — HYDROMORPHONE HCL 2 MG/ML IJ SOLN
2.0000 mg | INTRAMUSCULAR | Status: AC
  Administered 2022-04-11: 2 mg via INTRAVENOUS
  Filled 2022-04-11: qty 1

## 2022-04-11 MED ORDER — HYDROMORPHONE HCL 2 MG/ML IJ SOLN: 2.0000 mg | INTRAMUSCULAR | Status: DC | PRN

## 2022-04-11 MED ORDER — HYDROMORPHONE 1 MG/ML IV SOLN: INTRAVENOUS | Status: DC

## 2022-04-11 MED ORDER — FAMOTIDINE IN NACL 20-0.9 MG/50ML-% IV SOLN
20.0000 mg | INTRAVENOUS | Status: DC
  Filled 2022-04-11 (×3): qty 50

## 2022-04-11 MED ORDER — PREDNISONE 20 MG PO TABS
40.0000 mg | ORAL_TABLET | Freq: Every day | ORAL | Status: DC
  Administered 2022-04-12: 40 mg via ORAL
  Filled 2022-04-11: qty 2

## 2022-04-11 MED ORDER — SODIUM CHLORIDE 0.9 % IV SOLN
25.0000 mg | Freq: Once | INTRAVENOUS | Status: DC
  Filled 2022-04-11: qty 0.5

## 2022-04-11 MED ORDER — HYDROMORPHONE 1 MG/ML IV SOLN
INTRAVENOUS | Status: DC
  Administered 2022-04-11: 30 mg via INTRAVENOUS
  Administered 2022-04-12: 1.4 mg via INTRAVENOUS
  Administered 2022-04-12: 6.26 mg via INTRAVENOUS
  Administered 2022-04-12: 4 mg via INTRAVENOUS
  Administered 2022-04-12: 2 mg via INTRAVENOUS
  Administered 2022-04-12: 1 mg via INTRAVENOUS
  Administered 2022-04-13: 30 mg via INTRAVENOUS
  Administered 2022-04-13: 2 mg via INTRAVENOUS
  Filled 2022-04-11 (×2): qty 30

## 2022-04-11 MED ORDER — KETOROLAC TROMETHAMINE 15 MG/ML IJ SOLN
15.0000 mg | Freq: Four times a day (QID) | INTRAMUSCULAR | Status: DC
  Administered 2022-04-11 – 2022-04-12 (×3): 15 mg via INTRAVENOUS
  Filled 2022-04-11 (×3): qty 1

## 2022-04-11 MED ORDER — OXYCODONE HCL ER 15 MG PO T12A
15.0000 mg | EXTENDED_RELEASE_TABLET | Freq: Two times a day (BID) | ORAL | Status: DC
Start: 2022-04-11 — End: 2022-04-19
  Administered 2022-04-11 – 2022-04-19 (×16): 15 mg via ORAL
  Filled 2022-04-11 (×16): qty 1

## 2022-04-11 MED ORDER — GABAPENTIN 300 MG PO CAPS
300.0000 mg | ORAL_CAPSULE | Freq: Three times a day (TID) | ORAL | Status: DC
  Administered 2022-04-11: 300 mg via ORAL
  Filled 2022-04-11: qty 1

## 2022-04-11 MED ORDER — METHYLPREDNISOLONE SODIUM SUCC 125 MG IJ SOLR
125.0000 mg | Freq: Once | INTRAMUSCULAR | Status: AC
  Administered 2022-04-11: 125 mg via INTRAVENOUS
  Filled 2022-04-11: qty 2

## 2022-04-11 MED ORDER — KETOROLAC TROMETHAMINE 30 MG/ML IJ SOLN
30.0000 mg | Freq: Once | INTRAMUSCULAR | Status: AC
Start: 2022-04-11 — End: 2022-04-11
  Administered 2022-04-11: 30 mg via INTRAVENOUS
  Filled 2022-04-11: qty 1

## 2022-04-11 NOTE — Assessment & Plan Note (Signed)
Pt notes diffuse pruritic rash although this is difficult to appreciate on exam other than some blotchy erythema of her upper extremity.  Denies any new inciting factors -Received IV 125 mg Solu-Medrol in the ED.  Will transition to oral prednisone tomorrow. -Give IV Pepcid 20 mg

## 2022-04-11 NOTE — Progress Notes (Signed)
[place], [place]  [address] Phone:  [phone]   Fax:  [phone] Subjective:   Patient ID: Kara [name], female    DOB: 01-Oct-1986, 36 y.o.   MRN: [id number]  Chief Complaint  Patient presents with   Follow-up    Pt is here for rash on body pt stated that it is itchy also patient is in a lot of pain   HPI Kara [name] 36 y.o. female  has a past medical history of Arthritis, Chronic migraine, GERD (gastroesophageal reflux disease), cholecystectomy (2015), Pulmonary hypertension ([place]), Sickle cell anemia ([place]), and Tendinitis. To the [facility] for rash x 2 days.  States that she has been taking daily antihistamines, all prescribed medications with no improvement in symptoms. Has taken benadryl with no improvement in symptoms. Has taken steroids in th past, but states that she goes into a crisis when given prednisone. Endorses pain and itching, also states that rash is progressing rapidly. Rates pain 7/10 and describes as throbbing. Denies any other concerns today.  Denies any fatigue, chest pain, shortness of breath, HA or dizziness. Denies any blurred vision, numbness or tingling.  Past Medical History:  Diagnosis Date   Arthritis    Chronic migraine    GERD (gastroesophageal reflux disease)    Hx of cholecystectomy 2015   Pulmonary hypertension (HCC)    Sickle cell anemia (HCC)    Tendinitis    left elbow    Past Surgical History:  Procedure Laterality Date   CHOLECYSTECTOMY     DILATION AND CURETTAGE OF UTERUS     GALLBLADDER SURGERY      Family History  Adopted: Yes    Social History   Socioeconomic History   Marital status: Single    Spouse name: Not on file   Number of children: Not on file   Years of education: Not on file   Highest education level: Not on file  Occupational History   Not on file  Tobacco Use   Smoking status: Every Day    Packs/day: 0.50    Types: Cigarettes   Smokeless  tobacco: Never  Vaping Use   Vaping Use: Never used  Substance and Sexual Activity   Alcohol use: No   Drug use: Not Currently    Types: Marijuana   Sexual activity: Yes    Birth control/protection: None  Other Topics Concern   Not on file  Social History Narrative   Not on file   Social Determinants of Health   Financial Resource Strain: High Risk   Difficulty of Paying Living Expenses: Hard  Food Insecurity: Food Insecurity Present   Worried About Running Out of Food in the Last Year: Often true   Ran Out of Food in the Last Year: Often true  Transportation Needs: No Transportation Needs   Lack of Transportation (Medical): No   Lack of Transportation (Non-Medical): No  Physical Activity: Inactive   Days of Exercise per Week: 0 days   Minutes of Exercise per Session: 0 min  Stress: Stress Concern Present   Feeling of Stress : Rather much  Social Connections: Socially Isolated   Frequency of Communication with Friends and Family: Never   Frequency of Social Gatherings with Friends and Family: Never   Attends Religious Services: Never   Marine scientist or Organizations: No   Attends Archivist Meetings: Never   Marital Status: Never married  Intimate Partner Violence: Unknown   Fear  of Current or Ex-Partner: Patient refused   Emotionally Abused: Patient refused   Physically Abused: Patient refused   Sexually Abused: Patient refused    Outpatient Medications Prior to Visit  Medication Sig Dispense Refill   cetirizine (ZYRTEC) 10 MG tablet Take 1 tablet (10 mg total) by mouth daily. 30 tablet 11   folic acid (FOLVITE) 1 MG tablet Take 1 tablet (1 mg total) by mouth daily. 30 tablet 11   gabapentin (NEURONTIN) 300 MG capsule TAKE 1 CAPSULE BY MOUTH THREE TIMES A DAY 90 capsule 1   naproxen (NAPROSYN) 500 MG tablet Take 1 tablet (500 mg total) by mouth 3 (three) times daily with meals as needed. 90 tablet 3   omeprazole (PRILOSEC) 40 MG capsule Take 1  capsule (40 mg total) by mouth daily. 30 capsule 11   Vitamin D, Ergocalciferol, (DRISDOL) 1.25 MG (50000 UNIT) CAPS capsule Take 1 capsule (50,000 Units total) by mouth every 7 (seven) days. 12 capsule 0   oxyCODONE (OXYCONTIN) 15 mg 12 hr tablet Take 1 tablet (15 mg total) by mouth every 12 (twelve) hours. 60 tablet 0   oxyCODONE (ROXICODONE) 15 MG immediate release tablet Take 1 tablet (15 mg total) by mouth every 4 (four) hours as needed for up to 14 days for pain. 90 tablet 0   No facility-administered medications prior to visit.    Allergies  Allergen Reactions   Augmentin [Amoxicillin-Pot Clavulanate] Diarrhea and Nausea And Vomiting   Kiwi Extract Hives   Morphine And Related Hives   Paroxetine Hives    Review of Systems  Constitutional:  Negative for chills, fever and malaise/fatigue.  Respiratory:  Negative for cough and shortness of breath.   Cardiovascular:  Negative for chest pain, palpitations and leg swelling.  Gastrointestinal:  Negative for abdominal pain, blood in stool, constipation, diarrhea, nausea and vomiting.  Musculoskeletal:  Positive for joint pain.  Skin:  Positive for rash.  Neurological: Negative.   Psychiatric/Behavioral:  Negative for depression. The patient is not nervous/anxious.   All other systems reviewed and are negative.     Objective:    Physical Exam Constitutional:      General: She is not in acute distress.    Appearance: Normal appearance. She is normal weight.  HENT:     Head: Normocephalic.  Neck:     Vascular: No carotid bruit.  Cardiovascular:     Rate and Rhythm: Normal rate and regular rhythm.     Pulses: Normal pulses.     Heart sounds: Normal heart sounds.     Comments: No obvious peripheral edema Pulmonary:     Effort: Pulmonary effort is normal.     Breath sounds: Normal breath sounds.  Musculoskeletal:        General: No swelling, tenderness, deformity or signs of injury. Normal range of motion.     Cervical back:  Normal range of motion and neck supple. No rigidity or tenderness.     Right lower leg: No edema.     Left lower leg: No edema.  Lymphadenopathy:     Cervical: No cervical adenopathy.  Skin:    General: Skin is warm and dry.     Capillary Refill: Capillary refill takes less than 2 seconds.     Findings: Erythema and rash present.     Comments: Generalized rash with erythema noted, consistent with urticaria  Neurological:     General: No focal deficit present.     Mental Status: She is alert and oriented to  person, place, and time.  Psychiatric:        Mood and Affect: Mood normal.        Behavior: Behavior normal.        Thought Content: Thought content normal.        Judgment: Judgment normal.    BP 140/68 (BP Location: Right Arm, Patient Position: Sitting, Cuff Size: Normal)   Pulse 79   Temp (!) 97.5 F (36.4 C)   Ht _0  (1.6 m)   Wt 121 lb 3.2 oz (55 kg)   SpO2 99%   BMI 21.47 kg/m  Wt Readings from Last 3 Encounters:  04/11/22 121 lb 3.2 oz (55 kg)  03/27/22 122 lb 6 oz (55.5 kg)  01/09/22 127 lb 0.6 oz (57.6 kg)    Immunization History  Administered Date(s) Administered   Influenza Split 09/18/2014   Tdap 01/09/2022    Diabetic Foot Exam - Simple   No data filed     No results found for: TSH Lab Results  Component Value Date   WBC 16.3 (H) 03/27/2022   HGB 8.6 (L) 03/27/2022   HCT 25.4 (L) 03/27/2022   MCV 105 (H) 03/27/2022   PLT 359 03/27/2022   Lab Results  Component Value Date   NA 143 03/27/2022   K 4.0 03/27/2022   CO2 20 03/27/2022   GLUCOSE 95 03/27/2022   BUN 6 03/27/2022   CREATININE 0.63 03/27/2022   BILITOT 4.6 (H) 03/27/2022   ALKPHOS 61 03/27/2022   AST 28 03/27/2022   ALT 13 03/27/2022   PROT 7.9 03/27/2022   ALBUMIN 4.9 (H) 03/27/2022   CALCIUM 9.5 03/27/2022   ANIONGAP 8 08/26/2021   EGFR 119 03/27/2022   No results found for: CHOL No results found for: HDL No results found for: LDLCALC No results found for: TRIG No  results found for: CHOLHDL No results found for: HGBA1C     Assessment & Plan:   Problem List Items Addressed This Visit       Other   Sickle cell crisis (HCC)   Relevant Medications   oxyCODONE (OXYCONTIN) 15 mg 12 hr tablet   Hb-SS disease without crisis ([place])   Relevant Medications   oxyCODONE (ROXICODONE) 15 MG immediate release tablet   Other Visit Diagnoses     Rash    -  Primary   Relevant Orders   Ambulatory referral to Dermatology   Uncomplicated opioid dependence ([place])       Relevant Medications   oxyCODONE (OXYCONTIN) 15 mg 12 hr tablet   Other chronic pain       Relevant Medications   oxyCODONE (OXYCONTIN) 15 mg 12 hr tablet   oxyCODONE (ROXICODONE) 15 MG immediate release tablet These medications were unable to refilled due to e-scribe/ Epic issues. Patient has not had medication refilled in 2-3 days.    Urticaria     Due patient history and unresponsiveness to conservative measures, she was referred to the ED for further evaluation and management    Maintain upcoming follow up with PCP, sooner as needed     I am having Mercie Eon maintain her naproxen, gabapentin, Vitamin D (Ergocalciferol), omeprazole, cetirizine, folic acid, oxyCODONE, and oxyCODONE.  Meds ordered this encounter  Medications   oxyCODONE (OXYCONTIN) 15 mg 12 hr tablet    Sig: Take 1 tablet (15 mg total) by mouth every 12 (twelve) hours.    Dispense:  60 tablet    Refill:  0   oxyCODONE (ROXICODONE) 15  MG immediate release tablet    Sig: Take 1 tablet (15 mg total) by mouth every 4 (four) hours as needed for up to 14 days for pain.    Dispense:  90 tablet    Refill:  0     Teena Dunk, NP

## 2022-04-11 NOTE — Telephone Encounter (Signed)
Pt is having a allergic reaction and needs a called back ... RASH all over her body and wanted to speak with you.

## 2022-04-11 NOTE — H&P (Signed)
History and Physical    Patient: Kara [name] URK:[id number] DOB: 13-Nov-1986 DOA: 04/11/2022 DOS: the patient was seen and examined on 04/11/2022 PCP: Orion Crook I, NP  Patient coming from: Home  Chief Complaint:  Chief Complaint  Patient presents with   Sickle Cell Pain Crisis   Urticaria        HPI: [name] is a 36 y.o. female with medical history significant of sickle cell disease, history of acute chest syndrome, pulmonary hypertension,GERD and depression who presents with rash and sickle cell pain. Reports diffuse pleuritic rash for the past week and generalized pain for the past 2 days since she reportedly could not get her pain medications filled for several days.  Reports having a similar rash around the same time a year ago that required treatment with steroid.  On exam I do not appreciate a rash but does note some blotchy erythema at various spots on her upper extremity.  She showed me photos from last year and at that point she had noticeable raised urticarial rash.  No relief from taking Benadryl.  She denies any chest pain or shortness of breath.  No fever.  In the ED, she was afebrile normotensive on room air.  Leukocytosis of 16 which appears to be chronically elevated.  Hemoglobin at baseline of 8.5.  Platelet of 446.  CMP is unremarkable.  UA with trace leukocyte, negative nitrite and moderate hemoglobin but she report any urinary symptoms.  She was given multiple doses of 2 mg IV Dilaudid, IV Solu-Medrol 125 mg and started on IV fluid.  Hospitalist on-call for admission.  Review of Systems: As mentioned in the history of present illness. All other systems reviewed and are negative. Past Medical History:  Diagnosis Date   Arthritis    Chronic migraine    GERD (gastroesophageal reflux disease)    Hx of cholecystectomy 2015   Pulmonary hypertension (HCC)    Sickle cell anemia (HCC)    Tendinitis    left elbow   Past Surgical History:   Procedure Laterality Date   CHOLECYSTECTOMY     DILATION AND CURETTAGE OF UTERUS     GALLBLADDER SURGERY     Social History:  reports that she has been smoking cigarettes. She has been smoking an average of .5 packs per day. She has never used smokeless tobacco. She reports that she does not currently use drugs after having used the following drugs: Marijuana. She reports that she does not drink alcohol.  Allergies  Allergen Reactions   Augmentin [Amoxicillin-Pot Clavulanate] Diarrhea and Nausea And Vomiting   Kiwi Extract Hives   Morphine And Related Hives   Paroxetine Hives    Family History  Adopted: Yes    Prior to Admission medications   Medication Sig Start Date End Date Taking? Authorizing Provider  cetirizine (ZYRTEC) 10 MG tablet Take 1 tablet (10 mg total) by mouth daily. 04/07/22  Yes Passmore, Enid Derry I, NP  folic acid (FOLVITE) 1 MG tablet Take 1 tablet (1 mg total) by mouth daily. 04/11/22  Yes Massie Maroon, FNP  gabapentin (NEURONTIN) 300 MG capsule TAKE 1 CAPSULE BY MOUTH THREE TIMES A DAY 02/07/22  Yes Massie Maroon, FNP  naproxen (NAPROSYN) 500 MG tablet Take 1 tablet (500 mg total) by mouth 3 (three) times daily with meals as needed. 09/21/21  Yes Passmore, Enid Derry I, NP  omeprazole (PRILOSEC) 40 MG capsule Take 1 capsule (40 mg total) by mouth daily. 04/07/22  Yes Passmore, Lexine Baton,  NP  Vitamin D, Ergocalciferol, (DRISDOL) 1.25 MG (50000 UNIT) CAPS capsule Take 1 capsule (50,000 Units total) by mouth every 7 (seven) days. 03/28/22 06/26/22 Yes Passmore, Enid Derry I, NP  oxyCODONE (OXYCONTIN) 15 mg 12 hr tablet Take 1 tablet (15 mg total) by mouth every 12 (twelve) hours. Patient not taking: Reported on 04/11/2022 04/11/22   Orion Crook I, NP  oxyCODONE (ROXICODONE) 15 MG immediate release tablet Take 1 tablet (15 mg total) by mouth every 4 (four) hours as needed for up to 14 days for pain. Patient not taking: Reported on 04/11/2022 04/11/22 04/25/22  Massie Maroon,  FNP    Physical Exam: Vitals:   04/11/22 1830 04/11/22 1900 04/11/22 1938 04/11/22 2007  BP: 102/74 (!) 106/48 109/88 111/65  Pulse: 87 83 67 60  Resp: (!) 21 20 (!) 25 17  Temp:   98.3 F (36.8 C) 98.6 F (37 C)  TempSrc:   Oral Oral  SpO2: 94% 93% 97% 100%  Height:    5\' 5"  (1.651 m)   Constitutional: NAD, comfortable black young female sitting upright in bed Eyes: lids and conjunctivae normal ENMT: Mucous membranes are moist. Neck: normal, supple Respiratory: clear to auscultation bilaterally, no wheezing, no crackles. Normal respiratory effort. No accessory muscle use.  Cardiovascular: Regular rate and rhythm, no murmurs / rubs / gallops. No extremity edema.   Abdomen: no tenderness, no masses palpated. Bowel sounds positive.  Musculoskeletal: no clubbing / cyanosis. No joint deformity upper and lower extremities. Normal muscle tone.  Skin: faint erythema at various location on upper extremity. No papules, erythema noted at areas pt pointed to in her back, anterior cervical region on her face.  Neurologic: CN 2-12 grossly intact. Strength 5/5 in all 4.  Psychiatric: Normal judgment and insight. Alert and oriented x 3. Tearful later during encounter due to her rash  Data Reviewed:  See HPI   Assessment and Plan: * Sickle cell crisis (HCC) Sickle cell pain crisis Continues 0.45% IV fluids  IV Dilaudid via PCA with settings of 0.5mg , 10-minute lockout with max of 1mg /hr Toradol 15mg  q6HR Continue home OxyContin every 12 hrs. Hold home IR oxycodone. Monitor vital signs closely and re-evaluate pain scale    Anemia of chronic disease Hemoglobin stable at baseline 8.5.  No indication for transfusion.  Dermatitis Pt notes diffuse pruritic rash although this is difficult to appreciate on exam other than some blotchy erythema of her upper extremity.  Denies any new inciting factors -Received IV 125 mg Solu-Medrol in the ED.  Will transition to oral prednisone tomorrow. -Give  IV Pepcid 20 mg  Gastroesophageal reflux disease without esophagitis Continue PPI      Advance Care Planning:   Code Status: Prior Full  Consults: none  Family Communication: no family at bedside  Severity of Illness: The appropriate patient status for this patient is OBSERVATION. Observation status is judged to be reasonable and necessary in order to provide the required intensity of service to ensure the patient's safety. The patient's presenting symptoms, physical exam findings, and initial radiographic and laboratory data in the context of their medical condition is felt to place them at decreased risk for further clinical deterioration. Furthermore, it is anticipated that the patient will be medically stable for discharge from the hospital within 2 midnights of admission.   Author: , DO 04/11/2022 8:50 PM  For on call review www. .

## 2022-04-11 NOTE — ED Provider Notes (Signed)
[facility]-EMERGENCY DEPT Provider Note   CSN: [id number] Arrival date & time: 04/11/22  1526     History  Chief Complaint  Patient presents with   Sickle Cell Pain Crisis   Urticaria         Kara [name] is a 36 y.o. female.  The history is provided by the patient and medical records. No language interpreter was used.  Sickle Cell Pain Crisis Location:  Lower extremity, upper extremity and back Severity:  Severe Onset quality:  Gradual Duration:  3 days Similar to previous crisis episodes: yes   Timing:  Constant Progression:  Worsening Chronicity:  Recurrent Relieved by:  Nothing Worsened by:  Nothing Ineffective treatments:  None tried Associated symptoms: no chest pain, no congestion, no cough, no fatigue, no fever, no headaches, no nausea, no shortness of breath, no vomiting and no wheezing   Associated symptoms comment:  Rash Urticaria Pertinent negatives include no chest pain, no abdominal pain, no headaches and no shortness of breath.      Home Medications Prior to Admission medications   Medication Sig Start Date End Date Taking? Authorizing Provider  cetirizine (ZYRTEC) 10 MG tablet Take 1 tablet (10 mg total) by mouth daily. 04/07/22   Passmore, Enid Derry I, NP  folic acid (FOLVITE) 1 MG tablet Take 1 tablet (1 mg total) by mouth daily. 04/11/22   Massie Maroon, FNP  gabapentin (NEURONTIN) 300 MG capsule TAKE 1 CAPSULE BY MOUTH THREE TIMES A DAY 02/07/22   Massie Maroon, FNP  naproxen (NAPROSYN) 500 MG tablet Take 1 tablet (500 mg total) by mouth 3 (three) times daily with meals as needed. 09/21/21   Passmore, Enid Derry I, NP  omeprazole (PRILOSEC) 40 MG capsule Take 1 capsule (40 mg total) by mouth daily. 04/07/22   Orion Crook I, NP  oxyCODONE (OXYCONTIN) 15 mg 12 hr tablet Take 1 tablet (15 mg total) by mouth every 12 (twelve) hours. 04/11/22   Passmore, Enid Derry I, NP  oxyCODONE (ROXICODONE) 15 MG immediate release tablet Take  1 tablet (15 mg total) by mouth every 4 (four) hours as needed for up to 14 days for pain. 04/11/22 04/25/22  Massie Maroon, FNP  Vitamin D, Ergocalciferol, (DRISDOL) 1.25 MG (50000 UNIT) CAPS capsule Take 1 capsule (50,000 Units total) by mouth every 7 (seven) days. 03/28/22 06/26/22  Orion Crook I, NP      Allergies    Augmentin [amoxicillin-pot clavulanate], Kiwi extract, Morphine and related, and Paroxetine    Review of Systems   Review of Systems  Constitutional:  Negative for chills, fatigue and fever.  HENT:  Negative for congestion.   Eyes:  Negative for visual disturbance.  Respiratory:  Negative for cough, chest tightness, shortness of breath and wheezing.   Cardiovascular:  Negative for chest pain and palpitations.  Gastrointestinal:  Negative for abdominal pain, constipation, diarrhea, nausea and vomiting.  Genitourinary:  Negative for dysuria and flank pain.  Musculoskeletal:  Positive for back pain. Negative for neck pain and neck stiffness.  Skin:  Positive for rash. Negative for wound.  Neurological:  Negative for dizziness, weakness, light-headedness, numbness and headaches.  Psychiatric/Behavioral:  Negative for agitation.   All other systems reviewed and are negative.  Physical Exam Updated Vital Signs BP (!) 143/85 (BP Location: Left Arm)   Pulse 97   Temp 98.7 F (37.1 C) (Oral)   Resp 17   SpO2 92%  Physical Exam Vitals and nursing note reviewed.  Constitutional:  General: She is not in acute distress.    Appearance: She is well-developed. She is not ill-appearing, toxic-appearing or diaphoretic.  HENT:     Head: Normocephalic and atraumatic.     Nose: Nose normal.     Mouth/Throat:     Mouth: Mucous membranes are moist.  Eyes:     Extraocular Movements: Extraocular movements intact.     Conjunctiva/sclera: Conjunctivae normal.     Pupils: Pupils are equal, round, and reactive to light.  Cardiovascular:     Rate and Rhythm: Normal rate and  regular rhythm.     Heart sounds: No murmur heard. Pulmonary:     Effort: Pulmonary effort is normal. No respiratory distress.     Breath sounds: Normal breath sounds. No wheezing, rhonchi or rales.  Chest:     Chest wall: No tenderness.  Abdominal:     General: Abdomen is flat. There is no distension.     Palpations: Abdomen is soft.     Tenderness: There is no abdominal tenderness. There is no right CVA tenderness, left CVA tenderness, guarding or rebound.  Musculoskeletal:        General: No swelling or tenderness.     Cervical back: Neck supple. No tenderness.     Right lower leg: No edema.     Left lower leg: No edema.  Skin:    General: Skin is warm and dry.     Capillary Refill: Capillary refill takes less than 2 seconds.     Findings: Rash present. No erythema.  Neurological:     General: No focal deficit present.     Mental Status: She is alert.     Motor: No weakness.  Psychiatric:        Mood and Affect: Mood normal.    ED Results / Procedures / Treatments   Labs (all labs ordered are listed, but only abnormal results are displayed) Labs Reviewed  COMPREHENSIVE METABOLIC PANEL - Abnormal; Notable for the following components:      Result Value   Total Protein 9.1 (*)    Total Bilirubin 6.0 (*)    All other components within normal limits  CBC WITH DIFFERENTIAL/PLATELET - Abnormal; Notable for the following components:   WBC 16.1 (*)    RBC 2.36 (*)    Hemoglobin 8.5 (*)    HCT 23.9 (*)    MCV 101.3 (*)    MCH 36.0 (*)    RDW 22.5 (*)    Platelets 446 (*)    nRBC 2.7 (*)    Neutro Abs 10.8 (*)    Lymphs Abs 4.8 (*)    All other components within normal limits  RETICULOCYTES - Abnormal; Notable for the following components:   Retic Ct Pct 22.3 (*)    RBC. 2.33 (*)    Retic Count, Absolute 568.5 (*)    Immature Retic Fract 46.2 (*)    All other components within normal limits  URINALYSIS, ROUTINE W REFLEX MICROSCOPIC - Abnormal; Notable for the  following components:   Hgb urine dipstick MODERATE (*)    Leukocytes,Ua TRACE (*)    All other components within normal limits  I-STAT BETA HCG BLOOD, ED ([facility], [facility], [facility] ONLY)  TYPE AND SCREEN    EKG None  Radiology No results found.  Procedures Procedures    Medications Ordered in ED Medications  0.45 % sodium chloride infusion ( Intravenous Restarted 04/11/22 2012)  ondansetron (ZOFRAN) injection 4 mg (4 mg Intravenous Given 04/11/22 1725)  diphenhydrAMINE (BENADRYL)  25 mg in sodium chloride 0.9 % 50 mL IVPB (25 mg Intravenous Patient Refused/Not Given 04/11/22 1725)  ketorolac (TORADOL) 15 MG/ML injection 15 mg (has no administration in time range)  hydrOXYzine (ATARAX) tablet 25 mg (has no administration in time range)  famotidine (PEPCID) IVPB 20 mg premix (has no administration in time range)  predniSONE (DELTASONE) tablet 40 mg (has no administration in time range)  naloxone (NARCAN) injection 0.4 mg (has no administration in time range)    And  sodium chloride flush (NS) 0.9 % injection 9 mL (has no administration in time range)  HYDROmorphone (DILAUDID) 1 mg/mL PCA injection (has no administration in time range)  oxyCODONE (OXYCONTIN) 12 hr tablet 15 mg (has no administration in time range)  pantoprazole (PROTONIX) EC tablet 40 mg (has no administration in time range)  folic acid (FOLVITE) tablet 1 mg (has no administration in time range)  gabapentin (NEURONTIN) capsule 300 mg (has no administration in time range)  loratadine (CLARITIN) tablet 10 mg (has no administration in time range)  HYDROmorphone (DILAUDID) injection 2 mg (2 mg Intravenous Given 04/11/22 1726)  HYDROmorphone (DILAUDID) injection 2 mg (2 mg Intravenous Given 04/11/22 1801)  HYDROmorphone (DILAUDID) injection 2 mg (2 mg Intravenous Given 04/11/22 1848)  ketorolac (TORADOL) 30 MG/ML injection 30 mg (30 mg Intravenous Given 04/11/22 1848)  methylPREDNISolone sodium succinate (SOLU-MEDROL) 125 mg/2 mL injection  125 mg (125 mg Intravenous Given 04/11/22 1848)    ED Course/ Medical Decision Making/ A&P                           Medical Decision Making Amount and/or Complexity of Data Reviewed Labs: ordered.  Risk Prescription drug management. Decision regarding hospitalization.    Kara [name] is a 36 y.o. female with a past medical history significant for sickle cell anemia, previous cholecystectomy, GERD, chronic migraines, and pulm hypertension who presents from her PCP for likely admission for sickle cell pain crisis in the setting of possible allergic reaction.  Patient reports that 1 year ago almost to the day she had a similar admission for an allergic reaction to an unknown source which caused her to need steroids that sent her into a sickle cell pain crisis.  Patient reports not only has she had diffuse rash on her body for the last 2 days that she has tried some and histamines for without success, she is also having diffuse pain.  She reports pain in her low back and her extremity similar to previous sickle cell crises.  She reports she is out of pain medicine and symptoms have continued to worsen.  She reports her PCP told her to come here to "get admitted to help with the pain and monitor the reaction".  She otherwise denies fevers, chills, [place], cough, chest pain, shortness breath, nausea, vomiting, constipation, or diarrhea.  She does not think she has an acute chest syndrome or pneumonia at this time.  On exam, lungs clear and chest nontender.  Abdomen tender.  Back was mildly tender paraspinally and she did have a fine rash all over her body.  It did not look particular urticarial to me and looked almost post viral however it was reportedly pruritic.  Otherwise she had some mild muscle soreness in extremities but had intact sensation, strength, and pulses. No rash seen in her mouth or on the face on my exam.  We will treat the patient for possible sickle cell pain crisis and get  labs and give her medications.  The Benadryl for the itching for the medications will likely also help with some of the itching of her rash.  It does not look cellulitic to me initially.  We will reassess patient to see if she needs admission as her PCP suspect she will.  Patient's labs returned similar to prior however patient is still having uncontrolled pain despite medications.  She was still having the rash and itching and was requesting the steroids.  We will give the Solu-Medrol like she had last time and will call for admission for sickle cell pain crisis in the setting of possible viral versus allergic reaction.        Final Clinical Impression(s) / ED Diagnoses Final diagnoses:  Sickle cell pain crisis (HCC)    Clinical Impression: 1. Sickle cell pain crisis (HCC)     Disposition: Admit  This note was prepared with assistance of Dragon voice recognition software. Occasional wrong-word or sound-a-like substitutions may have occurred due to the inherent limitations of voice recognition software.     Karys Meckley, Canary Brimhristopher J, MD 04/11/22 2129

## 2022-04-11 NOTE — Telephone Encounter (Signed)
Patient has been admitted to the hospital and providers are aware.

## 2022-04-11 NOTE — Assessment & Plan Note (Signed)
Continue PPI ?

## 2022-04-11 NOTE — Assessment & Plan Note (Signed)
Hemoglobin stable at baseline 8.5.  No indication for transfusion.

## 2022-04-11 NOTE — ED Triage Notes (Signed)
Pt here from PCP for possible admission for sickle cell. Pt reports pain to back and bilateral legs. Pt has been out of her pain medication for the last 2-3 days. Pt states she has also broken out in hives.

## 2022-04-11 NOTE — Assessment & Plan Note (Addendum)
Sickle cell pain crisis Continues 0.45% IV fluids  IV Dilaudid via PCA with settings of 0.5mg , 10-minute lockout with max of 1mg /hr Toradol 15mg  q6HR Continue home OxyContin every 12 hrs. Hold home IR oxycodone. Monitor vital signs closely and re-evaluate pain scale

## 2022-04-12 ENCOUNTER — Encounter (HOSPITAL_COMMUNITY): Payer: Self-pay | Admitting: Family Medicine

## 2022-04-12 ENCOUNTER — Telehealth: Payer: Self-pay | Admitting: Nurse Practitioner

## 2022-04-12 DIAGNOSIS — L309 Dermatitis, unspecified: Secondary | ICD-10-CM | POA: Diagnosis present

## 2022-04-12 DIAGNOSIS — Z888 Allergy status to other drugs, medicaments and biological substances status: Secondary | ICD-10-CM | POA: Diagnosis not present

## 2022-04-12 DIAGNOSIS — Z79899 Other long term (current) drug therapy: Secondary | ICD-10-CM | POA: Diagnosis not present

## 2022-04-12 DIAGNOSIS — Z885 Allergy status to narcotic agent status: Secondary | ICD-10-CM | POA: Diagnosis not present

## 2022-04-12 DIAGNOSIS — D638 Anemia in other chronic diseases classified elsewhere: Secondary | ICD-10-CM | POA: Diagnosis present

## 2022-04-12 DIAGNOSIS — D5700 Hb-SS disease with crisis, unspecified: Secondary | ICD-10-CM | POA: Diagnosis present

## 2022-04-12 DIAGNOSIS — F1120 Opioid dependence, uncomplicated: Secondary | ICD-10-CM | POA: Diagnosis present

## 2022-04-12 DIAGNOSIS — K219 Gastro-esophageal reflux disease without esophagitis: Secondary | ICD-10-CM | POA: Diagnosis present

## 2022-04-12 DIAGNOSIS — G894 Chronic pain syndrome: Secondary | ICD-10-CM | POA: Diagnosis present

## 2022-04-12 DIAGNOSIS — I272 Pulmonary hypertension, unspecified: Secondary | ICD-10-CM | POA: Diagnosis present

## 2022-04-12 DIAGNOSIS — F17210 Nicotine dependence, cigarettes, uncomplicated: Secondary | ICD-10-CM | POA: Diagnosis present

## 2022-04-12 DIAGNOSIS — Z880 Allergy status to penicillin: Secondary | ICD-10-CM | POA: Diagnosis not present

## 2022-04-12 LAB — CBC WITH DIFFERENTIAL/PLATELET
Abs Immature Granulocytes: 0.41 10*3/uL — ABNORMAL HIGH (ref 0.00–0.07)
Basophils Absolute: 0.1 10*3/uL (ref 0.0–0.1)
Basophils Relative: 0 %
Eosinophils Absolute: 0 10*3/uL (ref 0.0–0.5)
Eosinophils Relative: 0 %
HCT: 17.5 % — ABNORMAL LOW (ref 36.0–46.0)
Hemoglobin: 6.4 g/dL — CL (ref 12.0–15.0)
Immature Granulocytes: 2 %
Lymphocytes Relative: 31 %
Lymphs Abs: 6.6 10*3/uL — ABNORMAL HIGH (ref 0.7–4.0)
MCH: 36.8 pg — ABNORMAL HIGH (ref 26.0–34.0)
MCHC: 36.6 g/dL — ABNORMAL HIGH (ref 30.0–36.0)
MCV: 100.6 fL — ABNORMAL HIGH (ref 80.0–100.0)
Monocytes Absolute: 1.9 10*3/uL — ABNORMAL HIGH (ref 0.1–1.0)
Monocytes Relative: 9 %
Neutro Abs: 12.1 10*3/uL — ABNORMAL HIGH (ref 1.7–7.7)
Neutrophils Relative %: 58 %
Platelets: 311 10*3/uL (ref 150–400)
RBC: 1.74 MIL/uL — ABNORMAL LOW (ref 3.87–5.11)
RDW: 22.1 % — ABNORMAL HIGH (ref 11.5–15.5)
WBC: 21.1 10*3/uL — ABNORMAL HIGH (ref 4.0–10.5)
nRBC: 3.3 % — ABNORMAL HIGH (ref 0.0–0.2)

## 2022-04-12 LAB — PREPARE RBC (CROSSMATCH)

## 2022-04-12 MED ORDER — SODIUM CHLORIDE 0.9% IV SOLUTION: Freq: Once | INTRAVENOUS | Status: AC

## 2022-04-12 MED ORDER — KETOROLAC TROMETHAMINE 15 MG/ML IJ SOLN
15.0000 mg | Freq: Four times a day (QID) | INTRAMUSCULAR | Status: AC
  Administered 2022-04-12 – 2022-04-17 (×20): 15 mg via INTRAVENOUS
  Filled 2022-04-12 (×20): qty 1

## 2022-04-12 MED ORDER — ACETAMINOPHEN 325 MG PO TABS
650.0000 mg | ORAL_TABLET | Freq: Once | ORAL | Status: AC
  Administered 2022-04-12: 650 mg via ORAL
  Filled 2022-04-12: qty 2

## 2022-04-12 MED ORDER — DIPHENHYDRAMINE HCL 50 MG/ML IJ SOLN
25.0000 mg | Freq: Once | INTRAMUSCULAR | Status: AC
  Administered 2022-04-12: 25 mg via INTRAVENOUS
  Filled 2022-04-12: qty 1

## 2022-04-12 MED ORDER — ORAL CARE MOUTH RINSE
15.0000 mL | Freq: Two times a day (BID) | OROMUCOSAL | Status: DC
  Administered 2022-04-12 – 2022-04-19 (×14): 15 mL via OROMUCOSAL

## 2022-04-12 NOTE — Progress Notes (Signed)
Patient informed this RN that her arm is hurting. IV is in her posterior medial forarm and patient complaining of arm hurting anterior forarm. RN checked IV and IV flushes with ease and has great blood return, site is soft with no redness or drainage. RN asked patient if she would like another IV started. Patient declined stating that her IVs always hurt and she just wanted me to be aware but does not want the IV removed nor does she want a new IV.

## 2022-04-12 NOTE — Progress Notes (Signed)
Kara Rochester, FNP acknowledged via secure chat that patient refuses to have bed alarm on and acknowledged that patient prefers to be on the 6th floor. NP placed transfer orders and discontinued cardiac monitoring.

## 2022-04-12 NOTE — Progress Notes (Signed)
Went into room with Dagoberto Ligas, RN charge nurse and spoke with patient regarding her concern about bed alarm being used. Patient expresses frustration and does not want bed alarm on stating "it's annoying, I'm not a little kid i've been doing this a long time and I don't need the bed alarm on, i'm not going to get up without calling you guys, I don't understand the problem they don't do this on the 6th floor." This RN explained and educated the patient on the need for the bed alarm to alert staff if she gets up without calling for assistance and that it is especially important because she is drowsy at times while on the PCA and our main goal is for her to be safe and not fall. Patient verbalized understanding but stated she does not want the bed alarm on. This RN then asked patient if she is refusing for her bed alarm to be on and patient stated "yes, I don't want it on." Dagoberto Ligas, RN at bedside and witnessed this entire discussion.

## 2022-04-12 NOTE — Telephone Encounter (Signed)
Pt called from ER requesting [place] call her ASAP or come see her ASAP

## 2022-04-12 NOTE — Plan of Care (Signed)
  Problem: Education: Goal: Knowledge of General Education information will improve Description: Including pain rating scale, medication(s)/side effects and non-pharmacologic comfort measures Outcome: Progressing   Problem: Activity: Goal: Risk for activity intolerance will decrease Outcome: Progressing   Problem: Coping: Goal: Level of anxiety will decrease Outcome: Progressing   Problem: Elimination: Goal: Will not experience complications related to urinary retention Outcome: Progressing   Problem: Pain Managment: Goal: General experience of comfort will improve Outcome: Not Progressing   Problem: Safety: Goal: Ability to remain free from injury will improve Outcome: Progressing   Problem: Skin Integrity: Goal: Risk for impaired skin integrity will decrease Outcome: Progressing

## 2022-04-12 NOTE — TOC Initial Note (Addendum)
Transition of Care [facility]) - Initial/Assessment Note    Patient Details  Name: Kara [name] MRN: [id number] Date of Birth: [birth year]  [facility] CM/SW Contact:    Leeroy Cha, RN Phone Number: 04/12/2022, 7:37 AM  Clinical Narrative:                 Patient has medicare insurance.  Not elig. For medication assistance.  Has trouble picking up meds.  Lives in [place].  Will need to explore her area and see if there is a pharmacy that will [place] [facility] that will deliver plus three pharmacies that deliver.  Will print these out and give to the patient. Expected Discharge Plan: Home/Self Care Barriers to Discharge: No Barriers Identified   Patient Goals and CMS Choice Patient states their goals for this hospitalization and ongoing recovery are:: to go home CMS Medicare.gov Compare Post Acute Care list provided to:: Patient    Expected Discharge Plan and Services Expected Discharge Plan: Home/Self Care   Discharge Planning Services: CM Consult   Living arrangements for the past 2 months: Apartment                                      Prior Living Arrangements/Services Living arrangements for the past 2 months: Apartment Lives with:: Self Patient language and need for interpreter reviewed:: Yes Do you feel safe going back to the place where you live?: Yes            Criminal Activity/Legal Involvement Pertinent to Current Situation/Hospitalization: No - Comment as needed  Activities of Daily Living Home Assistive Devices/Equipment: Eyeglasses ADL Screening (condition at time of admission) Patient's cognitive ability adequate to safely complete daily activities?: Yes Is the patient deaf or have difficulty hearing?: Yes (has been having ringing in both ears) Does the patient have difficulty seeing, even when wearing glasses/contacts?: No Does the patient have difficulty concentrating,  remembering, or making decisions?: Yes Patient able to express need for assistance with ADLs?: Yes Does the patient have difficulty dressing or bathing?: No Independently performs ADLs?: Yes (appropriate for developmental age) Does the patient have difficulty walking or climbing stairs?: Yes (at times) Weakness of Legs: Both Weakness of Arms/Hands: Both  Permission Sought/Granted                  Emotional Assessment Appearance:: Appears stated age     Orientation: : Oriented to Place, Oriented to Self, Oriented to  Time, Oriented to Situation Alcohol / Substance Use: Not Applicable Psych Involvement: No (comment)  Admission diagnosis:  Sickle cell crisis ([place]) [D57.00] Patient Active Problem List   Diagnosis Date Noted   Dermatitis 04/11/2022   Hypokalemia 08/18/2021   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/2021   COVID-19 06/22/2021   Hypotension 06/22/2021   Sickle cell anemia with crisis ([place]) 06/22/2021   Sickle cell anemia ([place]) 04/17/2021   Chronic post-traumatic stress disorder (PTSD) 03/01/2021   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[place]) 03/01/2021   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   Pain and swelling of left lower extremity 09/01/2020   Abscess of right buttock 04/17/2020   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 04/13/2020   Sickle cell anemia with pain ([place]) 12/08/2019   Decreased appetite 08/28/2019   Chronic pain of right knee 07/15/2019   Gastroesophageal reflux disease without esophagitis 07/15/2019  Anxiety August 07, 2019   Death of family member 2019-08-07   Chronic, continuous use of opioids Aug 07, 2019   Headache, new daily persistent ([place]) 07/04/2019   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 [address] Phone: [phone] Fax: [place], [place] [address] Phone: [phone] Fax: [phone]     Social Determinants of Health ([place]) Interventions    Readmission Risk Interventions    08/19/2021    9:51 AM  Readmission Risk Prevention Plan  Transportation Screening Complete  PCP or Specialist Appt within 3-5 Days Complete  HRI or [place] Complete  Social Work Consult for [place] Planning/Counseling Complete  Palliative Care Screening Not Applicable  Medication Review Press photographer) Complete

## 2022-04-12 NOTE — Progress Notes (Signed)
Discussed patient's care with Sharyn Blitz, RN charge nurse. Janett Billow, RN agreed to start 1st unit of blood that's ordered for patient.

## 2022-04-12 NOTE — Progress Notes (Signed)
Report called to Sunday Corn, RN on 6th floor. Patient will be moved to room 1605.

## 2022-04-12 NOTE — Progress Notes (Signed)
Spoke with Kara [name], [place] and made her aware that at start of shift patient was drowsy but aroused to voice and that PCA was checked and not programmed correctly per order. PCA had been programmed with continuous dose of 1 mg/H and with a 4 mg/h max dose limit. Patient dose was correct. When checking pump history patient had received 6.26 mg of dilaudid over the 4 hour period. PCA reprogrammed per order and verified with off going nurse and then again verified with day shift charge nurse Kara Blitz, RN. Patient now alert and no longer drowsy. RN asked NP about giving scheduled oxy and gabapentin. [place]is, FNP stated she would hold the gabapentin but okay to give oxy.

## 2022-04-12 NOTE — Progress Notes (Signed)
Went in room to give pre meds for blood and informed patient that I let Hart Rochester, NP know that she the patient refused bed alarm and that patient prefers the 6th floor so since she is no longer progressive care that [facility] placed orders for her to transfer to 6th floor if a bed becomes available. Patient stated "so I've gotten on your nerves and your trying to get rid of me." This RN explained that this is not the case and that she is no longer progressive care status so she does not require this unit and she has stated that she prefers the 6th floor several times. Pre meds for blood have been given. Asked patient if I could remove tele monitor since [place], NP discontinued it and patient requested for it to be taken off later.

## 2022-04-12 NOTE — Progress Notes (Signed)
Kara Flax, FNP secure chat to make her aware that patient is very upset and complaining about bed alarm being on. This has been explained to patient that bed alarm is important to prevent her from falling since she is on the PCA pump and is drowsy at times. Patient also has repeatedly referenced that she likes the 6th floor better than this floor stating that the nurses don't make her do all these things up there. Waiting for response from NP.

## 2022-04-12 NOTE — Progress Notes (Signed)
Forde Radon, NP message and let her know that patient is requesting that she call her. Waiting on response from NP.

## 2022-04-12 NOTE — Progress Notes (Signed)
Patient transferred by wheelchair on 2 L oxygen to room 1605 with Yong Channel, RN and [place], [place]. Patient alert with no distress noted when leaving 4W.

## 2022-04-12 NOTE — Progress Notes (Signed)
Subjective: Kara [name] is a 36 year old female with a medical history significant for sickle cell disease, chronic pain syndrome, opiate dependence and tolerance, history of anemia of chronic disease, history of pulmonary hypertension, and history of bipolar depression was admitted for sickle cell pain crisis. Today, patient has complaints of allover body pain primarily to upper and lower extremities and low back.  She rates her pain as 7/10.  Pain persists despite IV Dilaudid PCA.  Today, patient's hemoglobin is decreased to 6.4 g/dL, which is below her baseline.  Kara [name] is also complaining of an allover "body rash" characterized as itching and recurring.    Patient denies any dizziness, headache, urinary symptoms, nausea, vomiting, or diarrhea.  Objective:  Vital signs in last 24 hours:  Vitals:   04/12/22 0413 04/12/22 0538 04/12/22 0815 04/12/22 0815  BP:  120/67  (!) 105/56  Pulse:  74  100  Resp: 19 15  14   Temp:  99.6 F (37.6 C)  98.5 F (36.9 C)  TempSrc:  Oral  Oral  SpO2: 95% 93% 97% 98%  Weight:      Height:        Intake/Output from previous day:   Intake/Output Summary (Last 24 hours) at 04/12/2022 1143 Last data filed at 04/12/2022 0800 Gross per 24 hour  Intake 1763.33 ml  Output --  Net 1763.33 ml    Physical Exam: General: Alert, awake, oriented x3, in no acute distress.  HEENT: [place]/AT PEERL, EOMI.  Scleral icterus Neck: Trachea midline,  no masses, no thyromegal,y no JVD, no carotid bruit OROPHARYNX:  Moist, No exudate/ erythema/lesions.  Heart: Regular rate and rhythm, without murmurs, rubs, gallops, PMI non-displaced, no heaves or thrills on palpation.  Lungs: Clear to auscultation, no wheezing or rhonchi noted. No increased vocal fremitus resonant to percussion  Abdomen: Soft, nontender, nondistended, positive bowel sounds, no masses no hepatosplenomegaly noted..  Neuro: No focal neurological deficits noted cranial nerves II through XII grossly  intact. DTRs 2+ bilaterally upper and lower extremities. Strength 5 out of 5 in bilateral upper and lower extremities. Musculoskeletal: No warm swelling or erythema around joints, no spinal tenderness noted. Psychiatric: Patient alert and oriented x3, good insight and cognition, good recent to remote recall. Lymph node survey: No cervical axillary or inguinal lymphadenopathy noted. Skin: No rash, erythema, or dryness  Lab Results:  Basic Metabolic Panel:    Component Value Date/Time   NA 137 04/11/2022 1724   NA 143 03/27/2022 1541   K 3.9 04/11/2022 1724   CL 104 04/11/2022 1724   CO2 24 04/11/2022 1724   BUN 8 04/11/2022 1724   BUN 6 03/27/2022 1541   CREATININE 0.56 04/11/2022 1724   GLUCOSE 98 04/11/2022 1724   CALCIUM 9.5 04/11/2022 1724   CBC:    Component Value Date/Time   WBC 21.1 (H) 04/12/2022 0937   HGB 6.4 (LL) 04/12/2022 0937   HGB 8.6 (L) 03/27/2022 1541   HCT 17.5 (L) 04/12/2022 0937   HCT 25.4 (L) 03/27/2022 1541   PLT 311 04/12/2022 0937   PLT 359 03/27/2022 1541   MCV 100.6 (H) 04/12/2022 0937   MCV 105 (H) 03/27/2022 1541   NEUTROABS 12.1 (H) 04/12/2022 0937   NEUTROABS 6.5 03/27/2022 1541   LYMPHSABS 6.6 (H) 04/12/2022 0937   LYMPHSABS 7.7 (H) 03/27/2022 1541   MONOABS 1.9 (H) 04/12/2022 0937   EOSABS 0.0 04/12/2022 0937   EOSABS 0.4 03/27/2022 1541   BASOSABS 0.1 04/12/2022 0937   BASOSABS 0.1 03/27/2022 1541  No results found for this or any previous visit (from the past 240 hour(s)).  Studies/Results: No results found.  Medications: Scheduled Meds:  sodium chloride   Intravenous Once   acetaminophen  650 mg Oral Once   diphenhydrAMINE  25 mg Intravenous Once   folic acid  1 mg Oral Daily   HYDROmorphone   Intravenous Q4H   ketorolac  15 mg Intravenous Q6H   loratadine  10 mg Oral Daily   mouth rinse  15 mL Mouth Rinse BID   oxyCODONE  15 mg Oral Q12H   pantoprazole  40 mg Oral Daily   Continuous Infusions:  sodium chloride 125  mL/hr at 04/12/22 0827   diphenhydrAMINE     famotidine (PEPCID) IV     PRN Meds:.hydrOXYzine, naloxone **AND** sodium chloride flush, ondansetron  Consultants: None  Procedures: None  Antibiotics: None  Assessment/Plan: Principal Problem:   Sickle cell crisis (HCC) Active Problems:   Anemia of chronic disease   Gastroesophageal reflux disease without esophagitis   Dermatitis  Sickle cell disease with pain crisis: Decreased IV fluids to KVO Continue IV Dilaudid PCA without changes in settings Toradol 15 mg IV every 6 hours Continue home medications Monitor vital signs very closely, reevaluate pain scale regularly, and supplemental oxygen as needed  Anemia of chronic disease: Today, hemoglobin is 6.4 g/dL.  Baseline is 8-9 g/dL.  Transfuse 2 units PRBCs.  Follow labs in AM.  Dermatitis: Patient is complaining of an allover body rash.  No rash appreciated.  No erythema, skin eruptions, blisters, or hives noted.  We will discontinue steroids.  Patient has addressed this problem with her PCP, she has been referred to dermatology and will follow-up as an outpatient.  Chronic pain syndrome: Continue home medications  Leukocytosis: More than likely reactive.  No signs of acute infection or inflammation.  Continue to monitor closely without antibiotics.  Labs in AM.  GERD: Continue home medications  Code Status: Full Code Family Communication: N/A Disposition Plan: Not yet ready for discharge   Kara Weight [name]  APRN, MSN, FNP-C Patient [facility] Center [facility] [address] [place], [place] [address] [phone]  If 7PM-7AM, please contact night-coverage.  04/12/2022, 11:43 AM  LOS: 0 days

## 2022-04-12 NOTE — Progress Notes (Signed)
Forde Radon, FNP secure chat and made her aware of critical hgb 6.4, NP acknowledged and stated orders to follow.

## 2022-04-12 NOTE — Progress Notes (Signed)
Notified on call provider that patient refused cardiac monitor. Will continue to monitor pulse ox.

## 2022-04-13 DIAGNOSIS — D5700 Hb-SS disease with crisis, unspecified: Secondary | ICD-10-CM | POA: Diagnosis not present

## 2022-04-13 LAB — TYPE AND SCREEN
ABO/RH(D): A POS
Antibody Screen: NEGATIVE
Donor AG Type: NEGATIVE
Donor AG Type: NEGATIVE
Unit division: 0
Unit division: 0

## 2022-04-13 LAB — CBC
HCT: 28.6 % — ABNORMAL LOW (ref 36.0–46.0)
Hemoglobin: 10.4 g/dL — ABNORMAL LOW (ref 12.0–15.0)
MCH: 33.1 pg (ref 26.0–34.0)
MCHC: 36.4 g/dL — ABNORMAL HIGH (ref 30.0–36.0)
MCV: 91.1 fL (ref 80.0–100.0)
Platelets: 354 10*3/uL (ref 150–400)
RBC: 3.14 MIL/uL — ABNORMAL LOW (ref 3.87–5.11)
RDW: 23 % — ABNORMAL HIGH (ref 11.5–15.5)
WBC: 23.4 10*3/uL — ABNORMAL HIGH (ref 4.0–10.5)
nRBC: 5.1 % — ABNORMAL HIGH (ref 0.0–0.2)

## 2022-04-13 LAB — BPAM RBC
Blood Product Expiration Date: 202306142359
Blood Product Expiration Date: 202306142359
ISSUE DATE / TIME: 202305241540
ISSUE DATE / TIME: 202305242011
Unit Type and Rh: 9500
Unit Type and Rh: 9500

## 2022-04-13 MED ORDER — GABAPENTIN 300 MG PO CAPS
300.0000 mg | ORAL_CAPSULE | Freq: Once | ORAL | Status: AC
  Administered 2022-04-13: 300 mg via ORAL
  Filled 2022-04-13: qty 1

## 2022-04-13 MED ORDER — SENNA 8.6 MG PO TABS
1.0000 | ORAL_TABLET | Freq: Every day | ORAL | Status: DC
  Administered 2022-04-13 – 2022-04-18 (×2): 8.6 mg via ORAL
  Filled 2022-04-13 (×5): qty 1

## 2022-04-13 MED ORDER — POLYETHYLENE GLYCOL 3350 17 G PO PACK
17.0000 g | PACK | Freq: Every day | ORAL | Status: DC | PRN
  Administered 2022-04-13 – 2022-04-19 (×4): 17 g via ORAL
  Filled 2022-04-13 (×4): qty 1

## 2022-04-13 MED ORDER — HYDROMORPHONE 1 MG/ML IV SOLN
INTRAVENOUS | Status: DC
  Administered 2022-04-13: 3.5 mg via INTRAVENOUS
  Administered 2022-04-13 (×2): 2.5 mg via INTRAVENOUS
  Administered 2022-04-14: 5 mg via INTRAVENOUS
  Administered 2022-04-14: 4 mg via INTRAVENOUS
  Administered 2022-04-14 (×2): 0.5 mg via INTRAVENOUS
  Administered 2022-04-14: 5.5 mg via INTRAVENOUS
  Administered 2022-04-14: 3 mg via INTRAVENOUS
  Administered 2022-04-14: 4.5 mg via INTRAVENOUS
  Administered 2022-04-14: 30 mg via INTRAVENOUS
  Administered 2022-04-15: 4 mg via INTRAVENOUS
  Administered 2022-04-15: 3.5 mg via INTRAVENOUS
  Filled 2022-04-13: qty 30

## 2022-04-13 NOTE — Progress Notes (Signed)
  Transition of Care [facility]) Screening Note   Patient Details  Name: Kara [name] Date of Birth: 08/13/1986   Transition of Care [facility]) CM/SW Contact:    Aldon Hengst, Meriam Sprague, RN Phone Number: 04/13/2022, 10:52 AM    Transition of Care Department [facility]) has reviewed patient and no TOC needs have been identified at this time. We will continue to monitor patient advancement through interdisciplinary progression rounds. If new patient transition needs arise, please place a TOC consult.

## 2022-04-13 NOTE — Progress Notes (Signed)
    OVERNIGHT PROGRESS REPORT  Added single dose Neurontin consistent with patient chronic home med as noted of intent in Attending/NP note:  "Chronic pain syndrome: Continue home medications"    Chinita Greenland MSNA MSN ACNPC-AG Acute Care Nurse Practitioner [facility] [place]

## 2022-04-13 NOTE — Progress Notes (Signed)
Subjective: Kara [name] is a 36 year old female with a medical history significant for sickle cell disease, chronic pain syndrome, opiate dependence and tolerance, history of anemia of chronic disease, history of pulmonary hypertension, and history of bipolar depression was admitted for sickle cell pain crisis. Today, patient has complaints of allover body pain primarily to upper and lower extremities and low back.  She rates her pain as 7/10.  Pain persists despite IV Dilaudid PCA.  Today, patient's hemoglobin is decreased to 6.4 g/dL, which is below her baseline.  Ms. Kara [name] says that she feels "terrible" today.  She continues to have allover body pain.  She complains that PCA is constantly locking around although she is fairly pushed.   Patient denies any dizziness, headache, urinary symptoms, nausea, vomiting, or diarrhea.  Objective:  Vital signs in last 24 hours:  Vitals:   04/13/22 0946 04/13/22 1113 04/13/22 1341 04/13/22 1603  BP: (!) 151/73  108/68   Pulse: (!) 48  (!) 55   Resp:  14  16  Temp: 98.1 F (36.7 C)  98.2 F (36.8 C)   TempSrc: Oral  Oral   SpO2: 100%  98%   Weight:      Height:        Intake/Output from previous day:   Intake/Output Summary (Last 24 hours) at 04/13/2022 1645 Last data filed at 04/13/2022 1400 Gross per 24 hour  Intake 1919.99 ml  Output --  Net 1919.99 ml    Physical Exam: General: Alert, awake, oriented x3, in no acute distress.  HEENT: [place]/AT PEERL, EOMI.  Scleral icterus Neck: Trachea midline,  no masses, no thyromegal,y no JVD, no carotid bruit OROPHARYNX:  Moist, No exudate/ erythema/lesions.  Heart: Regular rate and rhythm, without murmurs, rubs, gallops, PMI non-displaced, no heaves or thrills on palpation.  Lungs: Clear to auscultation, no wheezing or rhonchi noted. No increased vocal fremitus resonant to percussion  Abdomen: Soft, nontender, nondistended, positive bowel sounds, no masses no hepatosplenomegaly noted..  Neuro:  No focal neurological deficits noted cranial nerves II through XII grossly intact. DTRs 2+ bilaterally upper and lower extremities. Strength 5 out of 5 in bilateral upper and lower extremities. Musculoskeletal: No warm swelling or erythema around joints, no spinal tenderness noted. Psychiatric: Patient alert and oriented x3, good insight and cognition, good recent to remote recall. Lymph node survey: No cervical axillary or inguinal lymphadenopathy noted. Skin: No rash, erythema, or dryness  Lab Results:  Basic Metabolic Panel:    Component Value Date/Time   NA 137 04/11/2022 1724   NA 143 03/27/2022 1541   K 3.9 04/11/2022 1724   CL 104 04/11/2022 1724   CO2 24 04/11/2022 1724   BUN 8 04/11/2022 1724   BUN 6 03/27/2022 1541   CREATININE 0.56 04/11/2022 1724   GLUCOSE 98 04/11/2022 1724   CALCIUM 9.5 04/11/2022 1724   CBC:    Component Value Date/Time   WBC 23.4 (H) 04/13/2022 0548   HGB 10.4 (L) 04/13/2022 0548   HGB 8.6 (L) 03/27/2022 1541   HCT 28.6 (L) 04/13/2022 0548   HCT 25.4 (L) 03/27/2022 1541   PLT 354 04/13/2022 0548   PLT 359 03/27/2022 1541   MCV 91.1 04/13/2022 0548   MCV 105 (H) 03/27/2022 1541   NEUTROABS 12.1 (H) 04/12/2022 0937   NEUTROABS 6.5 03/27/2022 1541   LYMPHSABS 6.6 (H) 04/12/2022 0937   LYMPHSABS 7.7 (H) 03/27/2022 1541   MONOABS 1.9 (H) 04/12/2022 0937   EOSABS 0.0 04/12/2022 0937   EOSABS  0.4 03/27/2022 1541   BASOSABS 0.1 04/12/2022 0937   BASOSABS 0.1 03/27/2022 1541    No results found for this or any previous visit (from the past 240 hour(s)).  Studies/Results: No results found.  Medications: Scheduled Meds:  folic acid  1 mg Oral Daily   HYDROmorphone   Intravenous Q4H   ketorolac  15 mg Intravenous Q6H   loratadine  10 mg Oral Daily   mouth rinse  15 mL Mouth Rinse BID   oxyCODONE  15 mg Oral Q12H   pantoprazole  40 mg Oral Daily   senna  1 tablet Oral Daily   Continuous Infusions:  sodium chloride 10 mL/hr at 04/13/22  0308   diphenhydrAMINE     famotidine (PEPCID) IV     PRN Meds:.hydrOXYzine, naloxone **AND** sodium chloride flush, ondansetron, polyethylene glycol  Consultants: None  Procedures: None  Antibiotics: None  Assessment/Plan: Principal Problem:   Sickle cell crisis (HCC) Active Problems:   Sickle cell pain crisis (HCC)   Anemia of chronic disease   Gastroesophageal reflux disease without esophagitis   Dermatitis  Sickle cell disease with pain crisis: Decreased IV fluids to KVO Continue IV Dilaudid PCA with an increase in settings Toradol 15 mg IV every 6 hours Continue home medications Monitor vital signs very closely, reevaluate pain scale regularly, and supplemental oxygen as needed  Anemia of chronic disease: Today, hemoglobin has improved to 10.4.  Patient is status post 2 units PRBCs.  Continue to follow labs follow labs in AM.  Dermatitis: Patient is complaining of an allover body rash.  No rash appreciated.  No erythema, skin eruptions, blisters, or hives noted.  We will monitor closely without steroids.  Patient has addressed this problem with her PCP, she has been referred to dermatology and will follow-up as an outpatient.  Chronic pain syndrome: Continue home medications  Leukocytosis: More than likely reactive.  No signs of acute infection or inflammation.  Continue to monitor closely without antibiotics.  Labs in AM.  GERD: Continue home medications  Code Status: Full Code Family Communication: N/A Disposition Plan: Not yet ready for discharge   Kerrigan Gombos [name]  APRN, MSN, FNP-C Patient Care Center [facility] [address] [place], [place] [address] [phone]  If 7PM-7AM, please contact night-coverage.  04/13/2022, 4:45 PM  LOS: 1 day

## 2022-04-14 DIAGNOSIS — D5700 Hb-SS disease with crisis, unspecified: Secondary | ICD-10-CM | POA: Diagnosis not present

## 2022-04-14 MED ORDER — FAMOTIDINE 20 MG PO TABS
20.0000 mg | ORAL_TABLET | Freq: Every day | ORAL | Status: DC
  Filled 2022-04-14: qty 1

## 2022-04-14 NOTE — Progress Notes (Signed)
PHARMACIST - PHYSICIAN COMMUNICATION  DR: Doreene Burke  CONCERNING: IV to Oral Route Change Policy  RECOMMENDATION: This patient is receiving Pepcid by the intravenous route.  Based on criteria approved by the Pharmacy and Therapeutics Committee, the intravenous medication(s) is/are being converted to the equivalent oral dose form(s).   DESCRIPTION: These criteria include: The patient is eating (either orally or via tube) and/or has been taking other orally administered medications for a least 24 hours The patient has no evidence of active gastrointestinal bleeding or impaired GI absorption (gastrectomy, short bowel, patient on TNA or NPO).  If you have questions about this conversion, please contact the Pharmacy Department    [phone] )  [facility] PharmD, BCPS [facility] main pharmacy [phone] 04/14/2022 1:31 PM

## 2022-04-14 NOTE — Progress Notes (Signed)
SICKLE CELL SERVICE PROGRESS NOTE  Shanessa Hodak MWU:[id number] DOB: [birth year] DOA: 04/11/2022 PCP: Orion Crook I, NP  Assessment/Plan: Principal Problem:   Sickle cell crisis (HCC) Active Problems:   Anemia of chronic disease   Dermatitis   Sickle cell pain crisis (HCC)   Gastroesophageal reflux disease without esophagitis  Sickle cell pain crisis: Patient still complaining of persistent pain.  Pain is 7 out of 10.  No fever or chills no nausea vomiting or diarrhea. Urticaria: Improved. Anemia of chronic disease: Continue to monitor H&H GERD: Continue PPIs  Code Status: Full Family Communication: No family at bedside Disposition Plan: Home  [facility]  Pager [phone]. If 7PM-7AM, please contact night-coverage.  04/14/2022, 12:55 PM  LOS: 2 days   Brief narrative: Kara [name] is a 36 y.o. female with medical history significant of sickle cell disease, history of acute chest syndrome, pulmonary hypertension,GERD and depression who presents with rash and sickle cell pain. Reports diffuse pleuritic rash for the past week and generalized pain for the past 2 days since she reportedly could not get her pain medications filled for several days.  Reports having a similar rash around the same time a year ago that required treatment with steroid.   On exam I do not appreciate a rash but does note some blotchy erythema at various spots on her upper extremity.  She showed me photos from last year and at that point she had noticeable raised urticarial rash.  No relief from taking Benadryl.   She denies any chest pain or shortness of breath.  No fever.   In the ED, she was afebrile normotensive on room air.  Leukocytosis of 16 which appears to be chronically elevated.  Hemoglobin at baseline of 8.5.  Platelet of 446.  CMP is unremarkable.  UA with trace leukocyte, negative nitrite and moderate hemoglobin but she report any urinary symptoms.   She was given multiple  doses of 2 mg IV Dilaudid, IV Solu-Medrol 125 mg and started on IV fluid.  Hospitalist on-call for admission.   Consultants: None  Procedures: None  Antibiotics: None  HPI/Subjective: Patient still having pain at 9 out of 10.  Also rashes due to urticaria  Objective: Vitals:   04/14/22 0527 04/14/22 0529 04/14/22 0737 04/14/22 1035  BP: 118/67   127/80  Pulse: 61   88  Resp: 18 18 16 18   Temp: 98.5 F (36.9 C)   98.1 F (36.7 C)  TempSrc: Oral   Oral  SpO2: 97% 97% 98% 94%  Weight:      Height:       Weight change:   Intake/Output Summary (Last 24 hours) at 04/14/2022 1255 Last data filed at 04/13/2022 1800 Gross per 24 hour  Intake 480 ml  Output --  Net 480 ml    General: Alert, awake, oriented x3, in no acute distress.  HEENT: [place]/AT [place]ERL, EOMI Neck: Trachea midline,  no masses, no thyromegal,y no JVD, no carotid bruit OROPHARYNX:  Moist, No exudate/ erythema/lesions.  Heart: Regular rate and rhythm, without murmurs, rubs, gallops, PMI non-displaced, no heaves or thrills on palpation.  Lungs: Clear to auscultation, no wheezing or rhonchi noted. No increased vocal fremitus resonant to percussion  Abdomen: Soft, nontender, nondistended, positive bowel sounds, no masses no hepatosplenomegaly noted..  Neuro: No focal neurological deficits noted cranial nerves II through XII grossly intact. DTRs 2+ bilaterally upper and lower extremities. Strength 5 out of 5 in bilateral upper and lower extremities. Musculoskeletal: No warm swelling  or erythema around joints, no spinal tenderness noted. Psychiatric: Patient alert and oriented x3, good insight and cognition, good recent to remote recall. Lymph node survey: No cervical axillary or inguinal lymphadenopathy noted.   Data Reviewed: Basic Metabolic Panel: Recent Labs  Lab 04/11/22 1724  NA 137  K 3.9  CL 104  CO2 24  GLUCOSE 98  BUN 8  CREATININE 0.56  CALCIUM 9.5   Liver Function Tests: Recent Labs  Lab  04/11/22 1724  AST 31  ALT 17  ALKPHOS 54  BILITOT 6.0*  PROT 9.1*  ALBUMIN 5.0   No results for input(s): LIPASE, AMYLASE in the last 168 hours. No results for input(s): AMMONIA in the last 168 hours. CBC: Recent Labs  Lab 04/11/22 1724 04/12/22 0937 04/13/22 0548  WBC 16.1* 21.1* 23.4*  NEUTROABS 10.8* 12.1*  --   HGB 8.5* 6.4* 10.4*  HCT 23.9* 17.5* 28.6*  MCV 101.3* 100.6* 91.1  PLT 446* 311 354   Cardiac Enzymes: No results for input(s): CKTOTAL, CKMB, CKMBINDEX, TROPONINI in the last 168 hours. BNP (last 3 results) No results for input(s): BNP in the last 8760 hours.  ProBNP (last 3 results) No results for input(s): PROBNP in the last 8760 hours.  CBG: No results for input(s): GLUCAP in the last 168 hours.  No results found for this or any previous visit (from the past 240 hour(s)).   Studies: No results found.  Scheduled Meds:  folic acid  1 mg Oral Daily   HYDROmorphone   Intravenous Q4H   ketorolac  15 mg Intravenous Q6H   loratadine  10 mg Oral Daily   mouth rinse  15 mL Mouth Rinse BID   oxyCODONE  15 mg Oral Q12H   pantoprazole  40 mg Oral Daily   senna  1 tablet Oral Daily   Continuous Infusions:  sodium chloride 10 mL/hr at 04/13/22 0308   diphenhydrAMINE     famotidine (PEPCID) IV      Principal Problem:   Sickle cell crisis (HCC) Active Problems:   Anemia of chronic disease   Dermatitis   Sickle cell pain crisis (HCC)   Gastroesophageal reflux disease without esophagitis

## 2022-04-15 DIAGNOSIS — K219 Gastro-esophageal reflux disease without esophagitis: Secondary | ICD-10-CM | POA: Diagnosis not present

## 2022-04-15 DIAGNOSIS — D638 Anemia in other chronic diseases classified elsewhere: Secondary | ICD-10-CM | POA: Diagnosis not present

## 2022-04-15 DIAGNOSIS — L309 Dermatitis, unspecified: Secondary | ICD-10-CM | POA: Diagnosis not present

## 2022-04-15 DIAGNOSIS — D5700 Hb-SS disease with crisis, unspecified: Secondary | ICD-10-CM | POA: Diagnosis not present

## 2022-04-15 MED ORDER — HYDROMORPHONE 1 MG/ML IV SOLN
INTRAVENOUS | Status: DC
  Administered 2022-04-15: 12.6 mg via INTRAVENOUS
  Administered 2022-04-15: 30 mg via INTRAVENOUS
  Administered 2022-04-15: 7.8 mg via INTRAVENOUS
  Administered 2022-04-16: 2.4 mg via INTRAVENOUS
  Administered 2022-04-16: 30 mg via INTRAVENOUS
  Administered 2022-04-16: 3 mg via INTRAVENOUS
  Filled 2022-04-15 (×2): qty 30

## 2022-04-15 MED ORDER — GABAPENTIN 300 MG PO CAPS
300.0000 mg | ORAL_CAPSULE | Freq: Three times a day (TID) | ORAL | Status: DC
  Administered 2022-04-15 – 2022-04-19 (×12): 300 mg via ORAL
  Filled 2022-04-15 (×12): qty 1

## 2022-04-15 MED ORDER — OXYCODONE HCL 5 MG PO TABS
15.0000 mg | ORAL_TABLET | ORAL | Status: DC | PRN
  Administered 2022-04-15 – 2022-04-18 (×7): 15 mg via ORAL
  Filled 2022-04-15 (×8): qty 3

## 2022-04-15 NOTE — Progress Notes (Signed)
Patient ID: Kara [name], female   DOB: [birth year], 36 y.o.   MRN: [id number] Subjective: Kara [name] is a 36 year old female with a medical history significant for sickle cell disease, chronic pain syndrome, opiate dependence and tolerance, history of anemia of chronic disease, history of pulmonary hypertension, and history of bipolar depression was admitted for sickle cell pain crisis.  Patient continues to complain of significant pain all over her body, she said she has not had any relief of her pain.  She is also complaining of rash in upper back and neck area as well as lower extremities.  She rates her pain as 7/10.  She has been on Dilaudid PCA since on admission as well as Toradol and other adjunct therapies.  She denies any fever, headache, cough, chest pain, shortness of breath, nausea, vomiting or diarrhea.  No urinary symptoms.  Objective:  Vital signs in last 24 hours:  Vitals:   04/15/22 0516 04/15/22 0540 04/15/22 0737 04/15/22 0907  BP:  118/74  118/71  Pulse:  86  83  Resp: 16  17 18   Temp:  98.3 F (36.8 C)  99.1 F (37.3 C)  TempSrc:  Oral  Oral  SpO2: 98% 93%  100%  Weight:      Height:        Intake/Output from previous day:   Intake/Output Summary (Last 24 hours) at 04/15/2022 1143 Last data filed at 04/14/2022 1400 Gross per 24 hour  Intake 240 ml  Output --  Net 240 ml    Physical Exam: General: Alert, awake, oriented x3, in no acute distress.  HEENT: [place]/AT PEERL, EOMI Neck: Trachea midline,  no masses, no thyromegal,y no JVD, no carotid bruit OROPHARYNX:  Moist, No exudate/ erythema/lesions.  Heart: Regular rate and rhythm, without murmurs, rubs, gallops, PMI non-displaced, no heaves or thrills on palpation.  Lungs: Clear to auscultation, no wheezing or rhonchi noted. No increased vocal fremitus resonant to percussion  Abdomen: Soft, nontender, nondistended, positive bowel sounds, no masses no hepatosplenomegaly noted..  Neuro: No focal  neurological deficits noted cranial nerves II through XII grossly intact. DTRs 2+ bilaterally upper and lower extremities. Strength 5 out of 5 in bilateral upper and lower extremities. Musculoskeletal: No warm swelling or erythema around joints, no spinal tenderness noted. Psychiatric: Patient alert and oriented x3, good insight and cognition, good recent to remote recall. Lymph node survey: No cervical axillary or inguinal lymphadenopathy noted.  Lab Results:  Basic Metabolic Panel:    Component Value Date/Time   NA 137 04/11/2022 1724   NA 143 03/27/2022 1541   K 3.9 04/11/2022 1724   CL 104 04/11/2022 1724   CO2 24 04/11/2022 1724   BUN 8 04/11/2022 1724   BUN 6 03/27/2022 1541   CREATININE 0.56 04/11/2022 1724   GLUCOSE 98 04/11/2022 1724   CALCIUM 9.5 04/11/2022 1724   CBC:    Component Value Date/Time   WBC 23.4 (H) 04/13/2022 0548   HGB 10.4 (L) 04/13/2022 0548   HGB 8.6 (L) 03/27/2022 1541   HCT 28.6 (L) 04/13/2022 0548   HCT 25.4 (L) 03/27/2022 1541   PLT 354 04/13/2022 0548   PLT 359 03/27/2022 1541   MCV 91.1 04/13/2022 0548   MCV 105 (H) 03/27/2022 1541   NEUTROABS 12.1 (H) 04/12/2022 0937   NEUTROABS 6.5 03/27/2022 1541   LYMPHSABS 6.6 (H) 04/12/2022 0937   LYMPHSABS 7.7 (H) 03/27/2022 1541   MONOABS 1.9 (H) 04/12/2022 0937   EOSABS 0.0 04/12/2022 04/14/2022  EOSABS 0.4 03/27/2022 1541   BASOSABS 0.1 04/12/2022 0937   BASOSABS 0.1 03/27/2022 1541    No results found for this or any previous visit (from the past 240 hour(s)).  Studies/Results: No results found.  Medications: Scheduled Meds:  folic acid  1 mg Oral Daily   gabapentin  300 mg Oral TID   HYDROmorphone   Intravenous Q4H   ketorolac  15 mg Intravenous Q6H   loratadine  10 mg Oral Daily   mouth rinse  15 mL Mouth Rinse BID   oxyCODONE  15 mg Oral Q12H   senna  1 tablet Oral Daily   Continuous Infusions:  sodium chloride 10 mL/hr at 04/15/22 0623   PRN Meds:.hydrOXYzine, naloxone **AND**  sodium chloride flush, ondansetron, oxyCODONE, polyethylene glycol  Consultants: None  Procedures: None  Antibiotics: None  Assessment/Plan: Principal Problem:   Sickle cell crisis (HCC) Active Problems:   Sickle cell pain crisis (HCC)   Anemia of chronic disease   Gastroesophageal reflux disease without esophagitis   Dermatitis  Hb Sickle Cell Disease with Pain crisis: Continue IVF at [facility].  Adjust weight based Dilaudid PCA to 0.6/10/4.2, continue IV Toradol 15 mg Q 6 H for total of 5 days, restart oral oxycodone and continue OxyContin as ordered.  Restart gabapentin and continue. Monitor vitals very closely, Re-evaluate pain scale regularly, 2 L of Oxygen by [place]. Leukocytosis: Most likely reactive and or steroid induced.  No fever or any other signs or symptoms of infection or inflammation.  We will continue to monitor very closely.  Labs in AM. Anemia of Chronic Disease: Patient is status post blood transfusion.  Hemoglobin is now stable at baseline.  There is no clinical indication for blood transfusion today.  We will monitor very closely, repeat labs in AM.   Chronic pain Syndrome: Restart short acting, restart gabapentin, continue OxyContin as ordered.  Patient counseled extensively about nonpharmacologic means of pain management. GERD: Patient refuses Protonix and Pepcid. We will discontinue both, patient will continue her omeprazole at home.  Code Status: Full Code Family Communication: N/A Disposition Plan: Not yet ready for discharge  [place]  If 7PM-7AM, please contact night-coverage.  04/15/2022, 11:43 AM  LOS: 3 days

## 2022-04-16 DIAGNOSIS — L309 Dermatitis, unspecified: Secondary | ICD-10-CM | POA: Diagnosis not present

## 2022-04-16 DIAGNOSIS — K219 Gastro-esophageal reflux disease without esophagitis: Secondary | ICD-10-CM | POA: Diagnosis not present

## 2022-04-16 DIAGNOSIS — D638 Anemia in other chronic diseases classified elsewhere: Secondary | ICD-10-CM | POA: Diagnosis not present

## 2022-04-16 DIAGNOSIS — D5700 Hb-SS disease with crisis, unspecified: Secondary | ICD-10-CM | POA: Diagnosis not present

## 2022-04-16 LAB — CBC WITH DIFFERENTIAL/PLATELET
Abs Immature Granulocytes: 0.08 10*3/uL — ABNORMAL HIGH (ref 0.00–0.07)
Basophils Absolute: 0.1 10*3/uL (ref 0.0–0.1)
Basophils Relative: 1 %
Eosinophils Absolute: 0.6 10*3/uL — ABNORMAL HIGH (ref 0.0–0.5)
Eosinophils Relative: 5 %
HCT: 28.2 % — ABNORMAL LOW (ref 36.0–46.0)
Hemoglobin: 9.7 g/dL — ABNORMAL LOW (ref 12.0–15.0)
Immature Granulocytes: 1 %
Lymphocytes Relative: 57 %
Lymphs Abs: 7 10*3/uL — ABNORMAL HIGH (ref 0.7–4.0)
MCH: 32.7 pg (ref 26.0–34.0)
MCHC: 34.4 g/dL (ref 30.0–36.0)
MCV: 94.9 fL (ref 80.0–100.0)
Monocytes Absolute: 1.4 10*3/uL — ABNORMAL HIGH (ref 0.1–1.0)
Monocytes Relative: 11 %
Neutro Abs: 3 10*3/uL (ref 1.7–7.7)
Neutrophils Relative %: 25 %
Platelet Morphology: NORMAL
Platelets: 337 10*3/uL (ref 150–400)
RBC: 2.97 MIL/uL — ABNORMAL LOW (ref 3.87–5.11)
RDW: 19.8 % — ABNORMAL HIGH (ref 11.5–15.5)
WBC: 12 10*3/uL — ABNORMAL HIGH (ref 4.0–10.5)
nRBC: 0.9 % — ABNORMAL HIGH (ref 0.0–0.2)

## 2022-04-16 LAB — BASIC METABOLIC PANEL
Anion gap: 7 (ref 5–15)
BUN: 14 mg/dL (ref 6–20)
CO2: 31 mmol/L (ref 22–32)
Calcium: 8.8 mg/dL — ABNORMAL LOW (ref 8.9–10.3)
Chloride: 105 mmol/L (ref 98–111)
Creatinine, Ser: 0.59 mg/dL (ref 0.44–1.00)
GFR, Estimated: >60 mL/min (ref >60)
Glucose, Bld: 98 mg/dL (ref 70–99)
Potassium: 4 mmol/L (ref 3.5–5.1)
Sodium: 143 mmol/L (ref 135–145)

## 2022-04-16 MED ORDER — HYDROMORPHONE 1 MG/ML IV SOLN
INTRAVENOUS | Status: DC
  Administered 2022-04-16: 8.5 mg via INTRAVENOUS
  Administered 2022-04-16: 6.6 mg via INTRAVENOUS
  Administered 2022-04-16: 3.5 mg via INTRAVENOUS
  Administered 2022-04-16: 9 mg via INTRAVENOUS
  Administered 2022-04-17: 6 mg via INTRAVENOUS
  Administered 2022-04-17: 5 mg via INTRAVENOUS
  Administered 2022-04-17: 6 mg via INTRAVENOUS
  Administered 2022-04-17: 5.5 mg via INTRAVENOUS
  Administered 2022-04-17 – 2022-04-18 (×2): 30 mg via INTRAVENOUS
  Administered 2022-04-18: 4.5 mg via INTRAVENOUS
  Administered 2022-04-18: 5 mg via INTRAVENOUS
  Administered 2022-04-18: 2.5 mg via INTRAVENOUS
  Administered 2022-04-18: 5 mg via INTRAVENOUS
  Filled 2022-04-16 (×2): qty 30

## 2022-04-16 MED ORDER — HYDROCORTISONE 1 % EX CREA
TOPICAL_CREAM | Freq: Three times a day (TID) | CUTANEOUS | Status: DC
  Administered 2022-04-18: 1 via TOPICAL
  Filled 2022-04-16: qty 28

## 2022-04-16 NOTE — Progress Notes (Signed)
Patient ID: Kara [name], female   DOB: 1986/08/22, 36 y.o.   MRN: [id number] Subjective: Kara [name] is a 36 year old female with a medical history significant for sickle cell disease, chronic pain syndrome, opiate dependence and tolerance, history of anemia of chronic disease, history of pulmonary hypertension, and history of bipolar depression was admitted for sickle cell pain crisis.  Patient claims she feels slightly better today but still itching with rashes on her upper back and lower extremities.  No new symptoms today.  Patient claims she is ambulating well in the room and to the bathroom.  She denies any fever, cough, chest pain, shortness of breath, nausea, vomiting or diarrhea.  No urinary symptoms.  Objective:  Vital signs in last 24 hours:  Vitals:   04/16/22 0349 04/16/22 0716 04/16/22 1103 04/16/22 1123  BP:   133/82   Pulse:   98   Resp: 14 14 13 12   Temp:   98.6 F (37 C)   TempSrc:   Oral   SpO2:  100% 98% 98%  Weight:      Height:        Intake/Output from previous day:  No intake or output data in the 24 hours ending 04/16/22 1149   Physical Exam: General: Alert, awake, oriented x3, in no acute distress.  HEENT: [place]/AT PEERL, EOMI Neck: Trachea midline,  no masses, no thyromegal,y no JVD, no carotid bruit OROPHARYNX:  Moist, No exudate/ erythema/lesions.  Heart: Regular rate and rhythm, without murmurs, rubs, gallops, PMI non-displaced, no heaves or thrills on palpation.  Lungs: Clear to auscultation, no wheezing or rhonchi noted. No increased vocal fremitus resonant to percussion  Abdomen: Soft, nontender, nondistended, positive bowel sounds, no masses no hepatosplenomegaly noted..  Neuro: No focal neurological deficits noted cranial nerves II through XII grossly intact. DTRs 2+ bilaterally upper and lower extremities. Strength 5 out of 5 in bilateral upper and lower extremities. Musculoskeletal: No warm swelling or erythema around joints, no spinal  tenderness noted. Psychiatric: Patient alert and oriented x3, good insight and cognition, good recent to remote recall. Lymph node survey: No cervical axillary or inguinal lymphadenopathy noted.  Lab Results:  Basic Metabolic Panel:    Component Value Date/Time   NA 143 04/16/2022 0546   NA 143 03/27/2022 1541   K 4.0 04/16/2022 0546   CL 105 04/16/2022 0546   CO2 31 04/16/2022 0546   BUN 14 04/16/2022 0546   BUN 6 03/27/2022 1541   CREATININE 0.59 04/16/2022 0546   GLUCOSE 98 04/16/2022 0546   CALCIUM 8.8 (L) 04/16/2022 0546   CBC:    Component Value Date/Time   WBC 12.0 (H) 04/16/2022 0546   HGB 9.7 (L) 04/16/2022 0546   HGB 8.6 (L) 03/27/2022 1541   HCT 28.2 (L) 04/16/2022 0546   HCT 25.4 (L) 03/27/2022 1541   PLT 337 04/16/2022 0546   PLT 359 03/27/2022 1541   MCV 94.9 04/16/2022 0546   MCV 105 (H) 03/27/2022 1541   NEUTROABS 3.0 04/16/2022 0546   NEUTROABS 6.5 03/27/2022 1541   LYMPHSABS 7.0 (H) 04/16/2022 0546   LYMPHSABS 7.7 (H) 03/27/2022 1541   MONOABS 1.4 (H) 04/16/2022 0546   EOSABS 0.6 (H) 04/16/2022 0546   EOSABS 0.4 03/27/2022 1541   BASOSABS 0.1 04/16/2022 0546   BASOSABS 0.1 03/27/2022 1541    No results found for this or any previous visit (from the past 240 hour(s)).  Studies/Results: No results found.  Medications: Scheduled Meds:  folic acid  1 mg Oral  Daily   gabapentin  300 mg Oral TID   hydrocortisone cream   Topical TID   HYDROmorphone   Intravenous Q4H   ketorolac  15 mg Intravenous Q6H   loratadine  10 mg Oral Daily   mouth rinse  15 mL Mouth Rinse BID   oxyCODONE  15 mg Oral Q12H   senna  1 tablet Oral Daily   Continuous Infusions:  sodium chloride 10 mL/hr at 04/15/22 0623   PRN Meds:.hydrOXYzine, naloxone **AND** sodium chloride flush, ondansetron, oxyCODONE, polyethylene glycol  Consultants: None  Procedures: None  Antibiotics: None  Assessment/Plan: Principal Problem:   Sickle cell crisis (HCC) Active  Problems:   Sickle cell pain crisis (HCC)   Anemia of chronic disease   Gastroesophageal reflux disease without esophagitis   Dermatitis  Hb Sickle Cell Disease with Pain crisis: Continue IVF at [facility].  Begin to wean weight based Dilaudid PCA to 0.5/10/3, continue IV Toradol 15 mg Q 6 H for total of 5 days, continue oral oxycodone and OxyContin as ordered.  Continue gabapentin. Monitor vitals very closely, Re-evaluate pain scale regularly, 2 L of Oxygen by [place]. Leukocytosis: Significantly improved.  Most likely reactive and or steroid induced.  No fever or any other signs or symptoms of infection or inflammation.  We will continue to monitor very closely.  Labs in AM. Anemia of Chronic Disease: Patient is status post blood transfusion.  Hemoglobin remains stable at baseline.  There is no clinical indication for blood transfusion today.  We will monitor very closely, repeat labs in AM.   Chronic pain Syndrome: Continue oxycodone, gabapentin, and OxyContin as ordered.  Patient counseled extensively about nonpharmacologic means of pain management. GERD: Patient refuses Protonix and Pepcid.  They were both discontinued yesterday. Patient will continue her omeprazole at home. Dermatitis: Hydrocortisone cream ordered.  Code Status: Full Code Family Communication: N/A Disposition Plan: Not yet ready for discharge  Jordyn Doane  If 7PM-7AM, please contact night-coverage.  04/16/2022, 11:49 AM  LOS: 4 days

## 2022-04-17 DIAGNOSIS — D5700 Hb-SS disease with crisis, unspecified: Secondary | ICD-10-CM | POA: Diagnosis not present

## 2022-04-17 NOTE — Progress Notes (Signed)
SICKLE CELL SERVICE PROGRESS NOTE  Kara [name] TOI:[id number] DOB: 1986/10/07 DOA: 04/11/2022 PCP: Orion Crook I, NP  Assessment/Plan: Principal Problem:   Sickle cell crisis (HCC) Active Problems:   Anemia of chronic disease   Dermatitis   Sickle cell pain crisis (HCC)   Gastroesophageal reflux disease without esophagitis  Sickle cell pain crisis: Patient still complaining of persistent pain.  Pain is still at 6 out of 10.  She is on Oxy IR 15 mg every 4 hours, OxyContin 50 mg every 12 hours and Dilaudid PCA.  Patient has completed Toradol.  We will be titrating her off Dilaudid PCA in the next 24 hours most likely..  No fever or chills no nausea vomiting or diarrhea. Dermatitis with urticaria: Improved on Hydrocortisone cream.  We will continue. Anemia of chronic disease: Continue to monitor H&H GERD: Continue PPIs  Code Status: Full Family Communication: No family at bedside Disposition Plan: Home  [facility]  Pager [phone]. If 7PM-7AM, please contact night-coverage.  04/17/2022, 5:13 PM  LOS: 5 days   Brief narrative: Kara [name] is a 36 y.o. female with medical history significant of sickle cell disease, history of acute chest syndrome, pulmonary hypertension,GERD and depression who presents with rash and sickle cell pain. Reports diffuse pleuritic rash for the past week and generalized pain for the past 2 days since she reportedly could not get her pain medications filled for several days.  Reports having a similar rash around the same time a year ago that required treatment with steroid.   On exam I do not appreciate a rash but does note some blotchy erythema at various spots on her upper extremity.  She showed me photos from last year and at that point she had noticeable raised urticarial rash.  No relief from taking Benadryl.   She denies any chest pain or shortness of breath.  No fever.   In the ED, she was afebrile normotensive on room air.   Leukocytosis of 16 which appears to be chronically elevated.  Hemoglobin at baseline of 8.5.  Platelet of 446.  CMP is unremarkable.  UA with trace leukocyte, negative nitrite and moderate hemoglobin but she report any urinary symptoms.   She was given multiple doses of 2 mg IV Dilaudid, IV Solu-Medrol 125 mg and started on IV fluid.  Hospitalist on-call for admission.   Consultants: None  Procedures: None  Antibiotics: None  HPI/Subjective: Patient is complaining of the pain going up and down today at 6 out of 10.  She has improved but not to her baseline.  No fever or chills no nausea vomiting or diarrhea.  Objective: Vitals:   04/17/22 1018 04/17/22 1212 04/17/22 1442 04/17/22 1654  BP: 137/81  (!) 114/59   Pulse: 80  84   Resp: 14 14 18 18   Temp: 98.4 F (36.9 C)  98.2 F (36.8 C)   TempSrc: Oral  Oral   SpO2: 100% 100% 100% 100%  Weight:      Height:       Weight change:   Intake/Output Summary (Last 24 hours) at 04/17/2022 1713 Last data filed at 04/17/2022 1008 Gross per 24 hour  Intake 240 ml  Output --  Net 240 ml     General: Alert, awake, oriented x3, in no acute distress.  HEENT: [place]/AT [place], EOMI Neck: Trachea midline,  no masses, no thyromegal,y no JVD, no carotid bruit OROPHARYNX:  Moist, No exudate/ erythema/lesions.  Heart: Regular rate and rhythm, without murmurs, rubs, gallops,  PMI non-displaced, no heaves or thrills on palpation.  Lungs: Clear to auscultation, no wheezing or rhonchi noted. No increased vocal fremitus resonant to percussion  Abdomen: Soft, nontender, nondistended, positive bowel sounds, no masses no hepatosplenomegaly noted..  Neuro: No focal neurological deficits noted cranial nerves II through XII grossly intact. DTRs 2+ bilaterally upper and lower extremities. Strength 5 out of 5 in bilateral upper and lower extremities. Musculoskeletal: No warm swelling or erythema around joints, no spinal tenderness noted. Psychiatric: Patient  alert and oriented x3, good insight and cognition, good recent to remote recall. Lymph node survey: No cervical axillary or inguinal lymphadenopathy noted.   Data Reviewed: Basic Metabolic Panel: Recent Labs  Lab 04/11/22 1724 04/16/22 0546  NA 137 143  K 3.9 4.0  CL 104 105  CO2 24 31  GLUCOSE 98 98  BUN 8 14  CREATININE 0.56 0.59  CALCIUM 9.5 8.8*    Liver Function Tests: Recent Labs  Lab 04/11/22 1724  AST 31  ALT 17  ALKPHOS 54  BILITOT 6.0*  PROT 9.1*  ALBUMIN 5.0    No results for input(s): LIPASE, AMYLASE in the last 168 hours. No results for input(s): AMMONIA in the last 168 hours. CBC: Recent Labs  Lab 04/11/22 1724 04/12/22 0937 04/13/22 0548 04/16/22 0546  WBC 16.1* 21.1* 23.4* 12.0*  NEUTROABS 10.8* 12.1*  --  3.0  HGB 8.5* 6.4* 10.4* 9.7*  HCT 23.9* 17.5* 28.6* 28.2*  MCV 101.3* 100.6* 91.1 94.9  PLT 446* 311 354 337    Cardiac Enzymes: No results for input(s): CKTOTAL, CKMB, CKMBINDEX, TROPONINI in the last 168 hours. BNP (last 3 results) No results for input(s): BNP in the last 8760 hours.  ProBNP (last 3 results) No results for input(s): PROBNP in the last 8760 hours.  CBG: No results for input(s): GLUCAP in the last 168 hours.  No results found for this or any previous visit (from the past 240 hour(s)).   Studies: No results found.  Scheduled Meds:  folic acid  1 mg Oral Daily   gabapentin  300 mg Oral TID   hydrocortisone cream   Topical TID   HYDROmorphone   Intravenous Q4H   loratadine  10 mg Oral Daily   mouth rinse  15 mL Mouth Rinse BID   oxyCODONE  15 mg Oral Q12H   senna  1 tablet Oral Daily   Continuous Infusions:  sodium chloride 10 mL/hr at 04/15/22 3846    Principal Problem:   Sickle cell crisis (HCC) Active Problems:   Anemia of chronic disease   Dermatitis   Sickle cell pain crisis (HCC)   Gastroesophageal reflux disease without esophagitis

## 2022-04-17 NOTE — Plan of Care (Signed)
  Problem: Bowel/Gastric: Goal: Gut motility will be maintained Outcome: Progressing   

## 2022-04-18 DIAGNOSIS — D5700 Hb-SS disease with crisis, unspecified: Secondary | ICD-10-CM | POA: Diagnosis not present

## 2022-04-18 LAB — CBC
HCT: 24.5 % — ABNORMAL LOW (ref 36.0–46.0)
Hemoglobin: 8.1 g/dL — ABNORMAL LOW (ref 12.0–15.0)
MCH: 32 pg (ref 26.0–34.0)
MCHC: 33.1 g/dL (ref 30.0–36.0)
MCV: 96.8 fL (ref 80.0–100.0)
Platelets: 285 10*3/uL (ref 150–400)
RBC: 2.53 MIL/uL — ABNORMAL LOW (ref 3.87–5.11)
RDW: 19.2 % — ABNORMAL HIGH (ref 11.5–15.5)
WBC: 13.4 10*3/uL — ABNORMAL HIGH (ref 4.0–10.5)
nRBC: 0.4 % — ABNORMAL HIGH (ref 0.0–0.2)

## 2022-04-18 MED ORDER — OXYCODONE HCL 5 MG PO TABS
15.0000 mg | ORAL_TABLET | ORAL | Status: DC
  Administered 2022-04-18 – 2022-04-19 (×4): 15 mg via ORAL
  Filled 2022-04-18 (×4): qty 3

## 2022-04-18 MED ORDER — OXYCODONE HCL ER 15 MG PO T12A: 15.0000 mg | EXTENDED_RELEASE_TABLET | Freq: Two times a day (BID) | ORAL | 0 refills | Status: DC

## 2022-04-18 MED ORDER — OXYCODONE HCL 15 MG PO TABS: 15.0000 mg | ORAL_TABLET | ORAL | 0 refills | Status: AC | PRN

## 2022-04-18 NOTE — Progress Notes (Signed)
Subjective: Kara [name] is a 36 year old female with a medical history significant for sickle cell disease, chronic pain syndrome, opiate dependence and tolerance, history of anemia of chronic disease, history of pulmonary hypertension, and history of bipolar depression was admitted for sickle cell pain crisis.  Patient states that her pain intensity has improved some overnight.  She does not feel ready to go home and is requesting 1 more day in the hospital.  She says that her pain intensity is 6/10 primarily to low back and lower extremities.  Patient denies any dizziness, headache, urinary symptoms, nausea, vomiting, or diarrhea.  Objective:  Vital signs in last 24 hours:  Vitals:   04/18/22 0550 04/18/22 0602 04/18/22 1011 04/18/22 1400  BP: 115/77  131/83 119/71  Pulse: 91  98 94  Resp: 12 13 18 14   Temp: 98.1 F (36.7 C)  98.2 F (36.8 C) 98.5 F (36.9 C)  TempSrc: Oral  Oral Oral  SpO2: 98% 98% 100% 100%  Weight:      Height:        Intake/Output from previous day:   Intake/Output Summary (Last 24 hours) at 04/18/2022 1658 Last data filed at 04/18/2022 1020 Gross per 24 hour  Intake 720 ml  Output --  Net 720 ml    Physical Exam: General: Alert, awake, oriented x3, in no acute distress.  HEENT: [place]/AT [place]ER[place], EOMI.  Scleral icterus Neck: Trachea midline,  no masses, no thyromegal,y no JVD, no carotid bruit OROPHARYNX:  Moist, No exudate/ erythema/lesions.  Heart: Regular rate and rhythm, without murmurs, rubs, gallops, PMI non-displaced, no heaves or thrills on palpation.  Lungs: Clear to auscultation, no wheezing or rhonchi noted. No increased vocal fremitus resonant to percussion  Abdomen: Soft, nontender, nondistended, positive bowel sounds, no masses no hepatosplenomegaly noted..  Neuro: No focal neurological deficits noted cranial nerves II through XII grossly intact. DTRs 2+ bilaterally upper and lower extremities. Strength 5 out of 5 in bilateral upper and  lower extremities. Musculoskeletal: No warm swelling or erythema around joints, no spinal tenderness noted. Psychiatric: Patient alert and oriented x3, good insight and cognition, good recent to remote recall. Lymph node survey: No cervical axillary or inguinal lymphadenopathy noted. Skin: No rash, erythema, or dryness  Lab Results:  Basic Metabolic Panel:    Component Value Date/Time   NA 143 04/16/2022 0546   NA 143 03/27/2022 1541   K 4.0 04/16/2022 0546   CL 105 04/16/2022 0546   CO2 31 04/16/2022 0546   BUN 14 04/16/2022 0546   BUN 6 03/27/2022 1541   CREATININE 0.59 04/16/2022 0546   GLUCOSE 98 04/16/2022 0546   CALCIUM 8.8 (L) 04/16/2022 0546   CBC:    Component Value Date/Time   WBC 13.4 (H) 04/18/2022 0731   HGB 8.1 (L) 04/18/2022 0731   HGB 8.6 (L) 03/27/2022 1541   HCT 24.5 (L) 04/18/2022 0731   HCT 25.4 (L) 03/27/2022 1541   PLT 285 04/18/2022 0731   PLT 359 03/27/2022 1541   MCV 96.8 04/18/2022 0731   MCV 105 (H) 03/27/2022 1541   NEUTROABS 3.0 04/16/2022 0546   NEUTROABS 6.5 03/27/2022 1541   LYMPHSABS 7.0 (H) 04/16/2022 0546   LYMPHSABS 7.7 (H) 03/27/2022 1541   MONOABS 1.4 (H) 04/16/2022 0546   EOSABS 0.6 (H) 04/16/2022 0546   EOSABS 0.4 03/27/2022 1541   BASOSABS 0.1 04/16/2022 0546   BASOSABS 0.1 03/27/2022 1541    No results found for this or any previous visit (from the past 240  hour(s)).  Studies/Results: No results found.  Medications: Scheduled Meds:  folic acid  1 mg Oral Daily   gabapentin  300 mg Oral TID   hydrocortisone cream   Topical TID   loratadine  10 mg Oral Daily   mouth rinse  15 mL Mouth Rinse BID   oxyCODONE  15 mg Oral Q4H while awake   oxyCODONE  15 mg Oral Q12H   senna  1 tablet Oral Daily   Continuous Infusions:   PRN Meds:.hydrOXYzine, ondansetron, polyethylene glycol  Consultants: None  Procedures: None  Antibiotics: None  Assessment/Plan: Principal Problem:   Sickle cell crisis (HCC) Active  Problems:   Sickle cell pain crisis (HCC)   Anemia of chronic disease   Gastroesophageal reflux disease without esophagitis   Dermatitis  Sickle cell disease with pain crisis: Discontinue IV Dilaudid PCA Schedule oxycodone 15 mg every 4 hours while patient is awake Continue OxyContin 15 mg every 12 hours monitor vital signs very closely, reevaluate pain scale regularly, and supplemental oxygen as needed Discharge plan for 04/19/2022  Anemia of chronic disease: Today, patient's hemoglobin is 8.1 g/dL, which is consistent with her baseline of 8-9 g/dL.  Patient is status post 2 units PRBCs.  Continue to follow closely.  Labs in AM.  Dermatitis: Improved with hydrocortisone cream, will continue.  Chronic pain syndrome: Continue home medications  Leukocytosis: More than likely reactive.  No signs of acute infection or inflammation.  Continue to monitor closely without antibiotics.  Labs in AM.  GERD: Continue home medications  Code Status: Full Code Family Communication: N/A Disposition Plan: Not yet ready for discharge   Amulya Quintin [name]  APRN, MSN, FNP-C [facility] [address] [place], [place] [address] [phone]  If 7PM-7AM, please contact night-coverage.  04/18/2022, 4:58 PM  LOS: 6 days

## 2022-04-18 NOTE — Progress Notes (Signed)
24 hour chart audit completed 

## 2022-04-18 NOTE — Care Management Important Message (Signed)
Important Message  Patient Details  Name: Elmira Olkowski MRN: [id number] Date of Birth: Mar 23, 1986   Medicare Important Message Given:  Yes     Sherilyn Banker 04/18/2022, 1:13 PM

## 2022-04-18 NOTE — Progress Notes (Signed)
5/30 Pt has rcv'd initial IM Letter, I spoke to patient via telephone and letter was explained again and verbal acknowledgement was given. Letter mailed to patient to the address listed on file.

## 2022-04-19 ENCOUNTER — Other Ambulatory Visit: Payer: Self-pay | Admitting: Family Medicine

## 2022-04-19 DIAGNOSIS — D5700 Hb-SS disease with crisis, unspecified: Secondary | ICD-10-CM | POA: Diagnosis not present

## 2022-04-19 MED ORDER — FOLIC ACID 1 MG PO TABS: 1.0000 mg | ORAL_TABLET | Freq: Every day | ORAL | 11 refills | Status: AC

## 2022-04-19 MED ORDER — OXYCODONE HCL 5 MG PO TABS
10.0000 mg | ORAL_TABLET | Freq: Once | ORAL | Status: AC
  Administered 2022-04-19: 10 mg via ORAL
  Filled 2022-04-19: qty 2

## 2022-04-19 MED ORDER — OMEPRAZOLE 40 MG PO CPDR: 40.0000 mg | DELAYED_RELEASE_CAPSULE | Freq: Every day | ORAL | 11 refills | Status: AC

## 2022-04-19 NOTE — Progress Notes (Signed)
Patient discharged home.  Discharge instructions explained, patient verbalizes understanding.  Prescription given to patient. 

## 2022-04-19 NOTE — Discharge Summary (Signed)
Physician Discharge Summary  Kara [name] UJW:[id number] DOB: 1986-04-03 DOA: 04/11/2022  PCP: Kara Crook I, NP  Admit date: 04/11/2022  Discharge date: 04/19/2022  Discharge Diagnoses:  Principal Problem:   Sickle cell crisis (HCC) Active Problems:   Sickle cell pain crisis (HCC)   Anemia of chronic disease   Gastroesophageal reflux disease without esophagitis   Dermatitis   Discharge Condition: Stable  Disposition:   Follow-up Information     [name], Kara I, NP Follow up in 1 week(s).   Specialty: Nurse Practitioner Contact information: [address] [place] [address] [phone]                Pt is discharged home in good condition and is to follow up with Kara Crook I, NP this week to have labs evaluated. Kara [name] is instructed to increase activity slowly and balance with rest for the next few days, and use prescribed medication to complete treatment of pain  Diet: Regular Wt Readings from Last 3 Encounters:  04/11/22 55.6 kg  04/11/22 55 kg  03/27/22 55.5 kg    History of present illness:  Kara [name] is a 36 year old female with a medical history significant for sickle cell disease, history of acute chest syndrome, pulmonary hypertension, GERD and depression who presents with rash and sickle cell pain.  Reports diffuse pruritic rash for the past week and generalized pain for the past 2 days since she reportedly could not get her pain medications filled for several days.  Reports having a similar rash on the same time a year ago that required treatment with steroid. On exam, I do not appreciate a rash, but does note some blotchy erythema at various spots on her upper extremities.  She showed me photos from last year, and at that point she had noticeable raised urticarial rash.  No relief from taking Benadryl. She denies any chest pains or shortness of breath.  No fever. In the ED, she was afebrile, normotensive, and  on room air.  Leukocytosis of 16, which appears to be chronically elevated.  Hemoglobin at baseline of 8.5.  Platelets 446,000.  CMP is unremarkable.  UA with trace leukocytes, negative nitrate and moderate hemoglobin, but she did not report any urinary symptoms. She was given multiple doses of 2 mg IV Dilaudid, IV Solu-Medrol 100 mg, and started on IV fluids.  Hospitalist on-call for admission.  Hospital Course:  Sickle cell disease with pain crisis: Patient was admitted for sickle cell pain crisis and managed appropriately with IVF, IV Dilaudid via PCA and IV Toradol, as well as other adjunct therapies per sickle cell pain management protocols.  Patient was very slow to respond to pain medication regimen.  She was eventually transitioned to be to her home medications. PDMP was reviewed prior to prescribing opiate medications, no inconsistencies noted. Oxycodone 15 mg #60 and OxyContin 15 mg #60 was written for this patient, she received a paper copy. Patient's pain intensity is 6/10 and she feels that she can manage at home on current medication regimen.  Anemia of chronic disease: On admission, hemoglobin was 6.4 g/dL and patient was transfused 2 units PRBCs.  Prior to discharge, patient's hemoglobin returned to baseline at 8.1 g/dL. She was advised to follow-up with PCP within 2 weeks for labs. Also, patient will follow-up for medication management. Patient was therefore discharged home today in a hemodynamically stable condition.   [name] will follow-up with PCP within 1 week of this discharge. Kara [name]  was counseled extensively about nonpharmacologic means of pain management, patient verbalized understanding.   We discussed the need for good hydration, monitoring of hydration status, avoidance of heat, cold, stress, and infection triggers. We discussed the need to be adherent with taking home medications. Patient was reminded of the need to seek medical attention immediately if any symptom of  bleeding, anemia, or infection occurs.  Discharge Exam: Vitals:   04/19/22 0610 04/19/22 1019  BP: 117/69 121/69  Pulse: 85 (!) 104  Resp: 18 16  Temp: 98.4 F (36.9 C) 98.7 F (37.1 C)  SpO2: 99% 97%   Vitals:   04/18/22 2042 04/19/22 0123 04/19/22 0610 04/19/22 1019  BP: 120/72 117/64 117/69 121/69  Pulse: 94 87 85 (!) 104  Resp: 18 18 18 16   Temp: 98.3 F (36.8 C) 98.8 F (37.1 C) 98.4 F (36.9 C) 98.7 F (37.1 C)  TempSrc: Oral Oral Oral Oral  SpO2: 97% 97% 99% 97%  Weight:      Height:        General appearance : Awake, alert, not in any distress. Speech Clear. Not toxic looking HEENT: Atraumatic and Normocephalic, pupils equally reactive to light and accomodation Neck: Supple, no JVD. No cervical lymphadenopathy.  Chest: Good air entry bilaterally, no added sounds  CVS: S1 S2 regular, no murmurs.  Abdomen: Bowel sounds present, Non tender and not distended with no gaurding, rigidity or rebound. Extremities: B/L Lower Ext shows no edema, both legs are warm to touch Neurology: Awake alert, and oriented X 3, CN II-XII intact, Non focal Skin: No Rash  Discharge Instructions   Allergies as of 04/19/2022       Reactions   Augmentin [amoxicillin-pot Clavulanate] Diarrhea, Nausea And Vomiting   Kiwi Extract Hives   Morphine And Related Hives   Paroxetine Hives        Medication List     TAKE these medications    cetirizine 10 MG tablet Commonly known as: ZYRTEC Take 1 tablet (10 mg total) by mouth daily.   folic acid 1 MG tablet Commonly known as: FOLVITE Take 1 tablet (1 mg total) by mouth daily.   gabapentin 300 MG capsule Commonly known as: NEURONTIN TAKE 1 CAPSULE BY MOUTH THREE TIMES A DAY   naproxen 500 MG tablet Commonly known as: Naprosyn Take 1 tablet (500 mg total) by mouth 3 (three) times daily with meals as needed.   omeprazole 40 MG capsule Commonly known as: PRILOSEC Take 1 capsule (40 mg total) by mouth daily.   oxyCODONE 15 mg  12 hr tablet Commonly known as: OxyCONTIN Take 1 tablet (15 mg total) by mouth every 12 (twelve) hours.   oxyCODONE 15 MG immediate release tablet Commonly known as: ROXICODONE Take 1 tablet (15 mg total) by mouth every 4 (four) hours as needed for up to 14 days for pain.   Vitamin D (Ergocalciferol) 1.25 MG (50000 UNIT) Caps capsule Commonly known as: DRISDOL Take 1 capsule (50,000 Units total) by mouth every 7 (seven) days.        The results of significant diagnostics from this hospitalization (including imaging, microbiology, ancillary and laboratory) are listed below for reference.    Significant Diagnostic Studies: No results found.  Microbiology: No results found for this or any previous visit (from the past 240 hour(s)).   Labs: Basic Metabolic Panel: Recent Labs  Lab 04/16/22 0546  NA 143  K 4.0  CL 105  CO2 31  GLUCOSE 98  BUN 14  CREATININE 0.59  CALCIUM 8.8*   Liver Function Tests: No results for input(s): AST, ALT, ALKPHOS, BILITOT, PROT, ALBUMIN in the last 168 hours. No results for input(s): LIPASE, AMYLASE in the last 168 hours. No results for input(s): AMMONIA in the last 168 hours. CBC: Recent Labs  Lab 04/13/22 0548 04/16/22 0546 04/18/22 0731  WBC 23.4* 12.0* 13.4*  NEUTROABS  --  3.0  --   HGB 10.4* 9.7* 8.1*  HCT 28.6* 28.2* 24.5*  MCV 91.1 94.9 96.8  PLT 354 337 285   Cardiac Enzymes: No results for input(s): CKTOTAL, CKMB, CKMBINDEX, TROPONINI in the last 168 hours. BNP: Invalid input(s): POCBNP CBG: No results for input(s): GLUCAP in the last 168 hours.  Time coordinating discharge: 50 minutes  Signed: Nolon Nations  APRN, MSN, FNP-C Patient Care [facility] [address] [place], [place] [address] [phone]  [facility] 04/19/2022, 12:46 PM

## 2022-04-25 ENCOUNTER — Encounter: Payer: Self-pay | Admitting: Dermatology

## 2022-04-25 ENCOUNTER — Ambulatory Visit (INDEPENDENT_AMBULATORY_CARE_PROVIDER_SITE_OTHER): Payer: Medicare Other | Admitting: Dermatology

## 2022-04-25 DIAGNOSIS — R21 Rash and other nonspecific skin eruption: Secondary | ICD-10-CM

## 2022-04-25 MED ORDER — MOMETASONE FUROATE 0.1 % EX CREA: 1.0000 "application " | TOPICAL_CREAM | Freq: Every day | CUTANEOUS | 0 refills | Status: AC | PRN

## 2022-04-25 NOTE — Progress Notes (Signed)
   New Patient Visit  Subjective  Kara Kara [name] is a 36 y.o. female who presents for the following: Rash (Patient here today for a itchy rash that started 1-2 weeks ago and was all over the body. Patient advises there is still some at arms, back and legs but it has improved. Patient was given hydrocortisone by PCP. ).  No new medications, soaps or products. No recent illnesses. Patient has photos of rash when it started. She said that she had similar rash about 1 year ago. Patient is adopted and not sure about hx of eczema, patient does have sickle cell.  The following portions of the chart were reviewed this encounter and updated as appropriate:       Review of Systems:  No other skin or systemic complaints except as noted in HPI or Assessment and Plan.  Objective  Well appearing patient in no apparent distress; mood and affect are within normal limits.  A focused examination was performed including face, chest, back, arms and legs. Relevant physical exam findings are noted in the Assessment and Plan.  trunk, face, extremities Kara [name] scattered hyperpigmented macules at back, chest, arms Violaceous macules at upper thighs Dyspigmentation at cheeks     Assessment & Plan  Rash trunk, face, extremities  Resolved. Possible eczema flare with residual PIH  Recommend mild soap and moisturizing cream 1-2 times daily.  Gentle skin care handout provided.    Start mometasone cream once daily as needed for itch/rash. Avoid applying to face, groin, and axilla. Use as directed. Long-term use can cause thinning of the skin. Continue daily Zyrtec. Recommend spf 30+ sunscreen to face, sun-exposed areas  RTC if rash recurs  Topical steroids (such as triamcinolone, fluocinolone, fluocinonide, mometasone, clobetasol, halobetasol, betamethasone, hydrocortisone) can cause thinning and lightening of the skin if they are used for too long in the same area. Your physician has selected the right  strength medicine for your problem and area affected on the body. Please use your medication only as directed by your physician to prevent side effects.    mometasone (ELOCON) 0.1 % cream - trunk, face, extremities Apply 1 application. topically daily as needed (Rash). Avoid applying to face, groin, and axilla. Use as directed. Long-term use can cause thinning of the skin.   Return if symptoms worsen or fail to improve.  Anise Salvo, RMA, am acting as scribe for Willeen Niece, MD .  Documentation: I have reviewed the above documentation for accuracy and completeness, and I agree with the above.  Willeen Niece MD

## 2022-04-25 NOTE — Patient Instructions (Addendum)
Gentle Skin Care Guide  1. Bathe no more than once a day.  2. Avoid bathing in hot water  3. Use a mild soap like Dove, Vanicream, Cetaphil, CeraVe. Can use Lever 2000 or Cetaphil antibacterial soap  4. Use soap only where you need it. On most days, use it under your arms, between your legs, and on your feet. Let the water rinse other areas unless visibly dirty.  5. When you get out of the bath/shower, use a towel to gently blot your skin dry, don't rub it.  6. While your skin is still a little damp, apply a moisturizing cream such as Vanicream, CeraVe, Cetaphil, Eucerin, Sarna lotion or plain Vaseline Jelly. For hands apply Neutrogena [place] Hand Cream or Excipial Hand Cream.  7. Reapply moisturizer any time you start to itch or feel dry.  8. Sometimes using free and clear laundry detergents can be helpful. Fabric softener sheets should be avoided. Downy Free & Gentle liquid, or any liquid fabric softener that is free of dyes and perfumes, it acceptable to use  9. If your doctor has given you prescription creams you may apply moisturizers over them   Start mometasone cream once daily as needed for itch/rash. Avoid applying to face, groin, and axilla. Use as directed. Long-term use can cause thinning of the skin. If rash occurs at face, may use mometasone once daily for up to 1 week.  Continue daily Zyrtec.  Topical steroids (such as triamcinolone, fluocinolone, fluocinonide, mometasone, clobetasol, halobetasol, betamethasone, hydrocortisone) can cause thinning and lightening of the skin if they are used for too long in the same area. Your physician has selected the right strength medicine for your problem and area affected on the body. Please use your medication only as directed by your physician to prevent side effects.    Due to recent changes in healthcare laws, you may see results of your pathology and/or laboratory studies on MyChart before the doctors have had a chance to review  them. We understand that in some cases there may be results that are confusing or concerning to you. Please understand that not all results are received at the same time and often the doctors may need to interpret multiple results in order to provide you with the best plan of care or course of treatment. Therefore, we ask that you please give [place] 2 business days to thoroughly review all your results before contacting the office for clarification. Should we see a critical lab result, you will be contacted sooner.   If You Need Anything After Your Visit  If you have any questions or concerns for your doctor, please call our main line at [phone] and press option 4 to reach your doctor's medical assistant. If no one answers, please leave a voicemail as directed and we will return your call as soon as possible. Messages left after 4 pm will be answered the following business day.   You may also send [place] a message via MyChart. We typically respond to MyChart messages within 1-2 business days.  For prescription refills, please ask your pharmacy to contact our office. Our fax number is [phone].  If you have an urgent issue when the clinic is closed that cannot wait until the next business day, you can page your doctor at the number below.    Please note that while we do our best to be available for urgent issues outside of office hours, we are not available 24/7.   If you have an urgent  issue and are unable to reach [place], you may choose to seek medical care at your doctor's office, retail clinic, urgent care center, or emergency room.  If you have a medical emergency, please immediately call 911 or go to the emergency department.  Pager Numbers  - Dr. Nehemiah Massed: [phone]  - Dr. Laurence Ferrari: [phone]  - Dr. Nicole Kindred: [phone]  In the event of inclement weather, please call our main line at [phone] for an update on the status of any delays or closures.  Dermatology Medication  Tips: Please keep the boxes that topical medications come in in order to help keep track of the instructions about where and how to use these. Pharmacies typically print the medication instructions only on the boxes and not directly on the medication tubes.   If your medication is too expensive, please contact our office at [phone] option 4 or send [place] a message through [place].   We are unable to tell what your co-pay for medications will be in advance as this is different depending on your insurance coverage. However, we may be able to find a substitute medication at lower cost or fill out paperwork to get insurance to cover a needed medication.   If a prior authorization is required to get your medication covered by your insurance company, please allow [place] 1-2 business days to complete this process.  Drug prices often vary depending on where the prescription is filled and some pharmacies may offer cheaper prices.  The website www.goodrx.com contains coupons for medications through different pharmacies. The prices here do not account for what the cost may be with help from insurance (it may be cheaper with your insurance), but the website can give you the price if you did not use any insurance.  - You can print the associated coupon and take it with your prescription to the pharmacy.  - You may also stop by our office during regular business hours and pick up a GoodRx coupon card.  - If you need your prescription sent electronically to a different pharmacy, notify our office through [facility] or by phone at [phone] option 4.     Si Usted Necesita Algo Despus de Su Visita  Tambin puede enviarnos un mensaje a travs de Pharmacist, community. Por lo general respondemos a los mensajes de MyChart en el transcurso de 1 a 2 das hbiles.  Para renovar recetas, por favor pida a su farmacia que se ponga en contacto con nuestra oficina. Harland Dingwall de fax es [place] [phone].  Si tiene un  asunto urgente cuando la clnica est cerrada y que no puede esperar hasta el siguiente da hbil, puede llamar/localizar a su doctor(a) al nmero que aparece a continuacin.   Por favor, tenga en cuenta que aunque hacemos todo lo posible para estar disponibles para asuntos urgentes fuera del horario de [place], no estamos disponibles las 24 horas del da, los 7 das de la [place].   Si tiene un problema urgente y no puede comunicarse con nosotros, puede optar por buscar atencin mdica  en el consultorio de su doctor(a), en una clnica privada, en un centro de atencin urgente o en una sala de emergencias.  Si tiene Engineering geologist, por favor llame inmediatamente al 911 o vaya a la sala de emergencias.  Nmeros de bper  - Dr. Nehemiah Massed: [phone]  - Dra. Moye: [phone]  - Dra. Nicole Kindred: [phone]  En caso de inclemencias del [place], por favor llame a nuestra lnea principal al [phone] para Ardelia Mems actualizacin  sobre el estado de cualquier retraso o cierre.  Consejos para la medicacin en dermatologa: Por favor, guarde las cajas en las que vienen los medicamentos de uso tpico para ayudarle a seguir las instrucciones sobre dnde y cmo usarlos. Las farmacias generalmente imprimen las instrucciones del medicamento slo en las cajas y no directamente en los tubos del [place].   Si su medicamento es muy caro, por favor, pngase en contacto con Rolm [place] llamando al [phone] y presione la opcin 4 o envenos un mensaje a travs de Clinical cytogeneticist.   No podemos decirle cul ser su copago por los medicamentos por adelantado ya que esto es diferente dependiendo de la cobertura de su seguro. Sin embargo, es posible que podamos encontrar un medicamento sustituto a Audiological scientist un formulario para que el seguro cubra el medicamento que se considera necesario.   Si se requiere una autorizacin previa para que su compaa de seguros [place] su medicamento, por favor  permtanos de 1 a 2 das hbiles para completar [address]  Los precios de los medicamentos varan con frecuencia dependiendo del Environmental consultant de dnde se surte la receta y alguna farmacias pueden ofrecer precios ms baratos.  El sitio web www.goodrx.com tiene cupones para medicamentos de Health and safety inspector. Los precios aqu no tienen en cuenta lo que podra costar con la ayuda del seguro (puede ser ms barato con su seguro), pero el sitio web puede darle el precio si no utiliz Tourist information centre manager.  - Puede imprimir el cupn correspondiente y llevarlo con su receta a la farmacia.  - Tambin puede pasar por nuestra oficina durante el horario de atencin regular y Education officer, museum una tarjeta de cupones de GoodRx.  - Si necesita que su receta se enve electrnicamente a una farmacia diferente, informe a nuestra oficina a travs de MyChart de [place] o por telfono llamando al [phone] y presione la opcin 4.

## 2022-04-26 ENCOUNTER — Other Ambulatory Visit: Payer: Self-pay

## 2022-04-26 DIAGNOSIS — R21 Rash and other nonspecific skin eruption: Secondary | ICD-10-CM

## 2022-04-26 MED ORDER — KETOCONAZOLE 2 % EX CREA: 1.0000 "application " | TOPICAL_CREAM | Freq: Two times a day (BID) | CUTANEOUS | 1 refills | Status: AC

## 2022-05-02 ENCOUNTER — Telehealth: Payer: Self-pay

## 2022-05-02 NOTE — Telephone Encounter (Signed)
Oxycodone  °

## 2022-05-03 ENCOUNTER — Telehealth: Payer: Self-pay | Admitting: Family Medicine

## 2022-05-03 ENCOUNTER — Other Ambulatory Visit: Payer: Self-pay | Admitting: Family Medicine

## 2022-05-03 DIAGNOSIS — G8929 Other chronic pain: Secondary | ICD-10-CM

## 2022-05-03 MED ORDER — OXYCODONE HCL 15 MG PO TABS: 15.0000 mg | ORAL_TABLET | ORAL | 0 refills | Status: DC | PRN

## 2022-05-03 NOTE — Progress Notes (Signed)
Reviewed PDMP substance reporting system prior to prescribing opiate medications. No inconsistencies noted.  Meds ordered this encounter  Medications   oxyCODONE (ROXICODONE) 15 MG immediate release tablet    Sig: Take 1 tablet (15 mg total) by mouth every 4 (four) hours as needed for pain.    Dispense:  90 tablet    Refill:  0    Order Specific Question:   Supervising Provider    Answer:   JEGEDE, OLUGBEMIGA E [[id number]]   Oakes Mccready Moore Shed Nixon  APRN, MSN, FNP-C Patient Care Center [facility] [address]  [place], [place] [address] [phone]  

## 2022-05-03 NOTE — Telephone Encounter (Signed)
Oxycodone 15 mg refill request  

## 2022-05-12 ENCOUNTER — Telehealth: Payer: Self-pay

## 2022-05-12 ENCOUNTER — Telehealth: Payer: Self-pay | Admitting: Nurse Practitioner

## 2022-05-12 NOTE — Telephone Encounter (Signed)
Pt called stating that on Tuesday she was standing for about an hour to do her hair in the mirror, when she went to move her feet she got sharp pains in both legs from her feet all the way up her thighs. Pt stated it almost made her fall.  Pt also stated that this morning she woke up with her head hurting and her bones and joints hurting. Pt stated last time this happened she had to be hospitalized. Pt states she was hospitalized at [facility] for this the last time.

## 2022-05-16 ENCOUNTER — Telehealth: Payer: Self-pay

## 2022-05-17 ENCOUNTER — Other Ambulatory Visit: Payer: Self-pay | Admitting: Family Medicine

## 2022-05-17 DIAGNOSIS — G8929 Other chronic pain: Secondary | ICD-10-CM

## 2022-05-17 DIAGNOSIS — F1120 Opioid dependence, uncomplicated: Secondary | ICD-10-CM

## 2022-05-17 DIAGNOSIS — D5700 Hb-SS disease with crisis, unspecified: Secondary | ICD-10-CM

## 2022-05-18 ENCOUNTER — Other Ambulatory Visit: Payer: Self-pay | Admitting: Family Medicine

## 2022-05-18 DIAGNOSIS — D5700 Hb-SS disease with crisis, unspecified: Secondary | ICD-10-CM

## 2022-05-18 DIAGNOSIS — G8929 Other chronic pain: Secondary | ICD-10-CM

## 2022-05-18 DIAGNOSIS — F1120 Opioid dependence, uncomplicated: Secondary | ICD-10-CM

## 2022-05-18 MED ORDER — OXYCODONE HCL 15 MG PO TABS: 15.0000 mg | ORAL_TABLET | ORAL | 0 refills | Status: DC | PRN

## 2022-05-18 MED ORDER — OXYCODONE HCL ER 15 MG PO T12A: 15.0000 mg | EXTENDED_RELEASE_TABLET | Freq: Two times a day (BID) | ORAL | 0 refills | Status: DC

## 2022-05-18 NOTE — Telephone Encounter (Signed)
Oxycodone Oxycontin 

## 2022-05-18 NOTE — Progress Notes (Signed)
Meds ordered this encounter  Medications   oxyCODONE (OXYCONTIN) 15 mg 12 hr tablet    Sig: Take 1 tablet (15 mg total) by mouth every 12 (twelve) hours.    Dispense:  60 tablet    Refill:  0    Order Specific Question:   Supervising Provider    Answer:   Quentin Angst [[id number]]   oxyCODONE (ROXICODONE) 15 MG immediate release tablet    Sig: Take 1 tablet (15 mg total) by mouth every 4 (four) hours as needed for pain.    Dispense:  90 tablet    Refill:  0    Order Specific Question:   Supervising Provider    Answer:   Quentin Angst [[id number]]   Reviewed PDMP substance reporting system prior to prescribing opiate medications. No inconsistencies noted.    Nolon Nations  APRN, MSN, FNP-C Patient Care [facility] [address] [place], [place] [address] [phone]

## 2022-05-19 ENCOUNTER — Telehealth: Payer: Self-pay

## 2022-05-19 NOTE — Telephone Encounter (Signed)
Prior Authorization started for Oxycodone HCL ER 15 MG Tab Confirmation #:[id number]

## 2022-05-22 ENCOUNTER — Telehealth: Payer: Self-pay | Admitting: Nurse Practitioner

## 2022-05-22 ENCOUNTER — Other Ambulatory Visit: Payer: Self-pay | Admitting: Family Medicine

## 2022-05-22 DIAGNOSIS — G8929 Other chronic pain: Secondary | ICD-10-CM

## 2022-05-22 MED ORDER — OXYCODONE HCL 15 MG PO TABS: 15.0000 mg | ORAL_TABLET | ORAL | 0 refills | Status: DC | PRN

## 2022-05-22 NOTE — Telephone Encounter (Signed)
Pt called asking to speak to Lachina reguarding her pain medicine.  She states that she has called every pharmacy and no pharmacy she has called has Oxycodone 15 mg.   Pt states her contract she signed states that she won't pick up her meds from any other place so she does not want to call any pharmacy besides [facility] so there are not issues with her contract.   Pt is requesting lachina call her at her regular phone number- says her phone is back on.

## 2022-05-22 NOTE — Telephone Encounter (Signed)
Pt called back and said hat the pharmacy that has her med's is   [facility] [address]  [place] [address]

## 2022-05-22 NOTE — Progress Notes (Signed)
Pharmacy changed. Medication resent.   Meds ordered this encounter  Medications   oxyCODONE (ROXICODONE) 15 MG immediate release tablet    Sig: Take 1 tablet (15 mg total) by mouth every 4 (four) hours as needed for pain.    Dispense:  90 tablet    Refill:  0    Order Specific Question:   Supervising Provider    Answer:   Quentin Angst [id number]

## 2022-06-05 ENCOUNTER — Other Ambulatory Visit: Payer: Self-pay | Admitting: Family Medicine

## 2022-06-05 DIAGNOSIS — G8929 Other chronic pain: Secondary | ICD-10-CM

## 2022-06-05 MED ORDER — OXYCODONE HCL 15 MG PO TABS: 15.0000 mg | ORAL_TABLET | ORAL | 0 refills | Status: DC | PRN

## 2022-06-05 NOTE — Progress Notes (Signed)
Reviewed PDMP substance reporting system prior to prescribing opiate medications. No inconsistencies noted.  Meds ordered this encounter  Medications   oxyCODONE (ROXICODONE) 15 MG immediate release tablet    Sig: Take 1 tablet (15 mg total) by mouth every 4 (four) hours as needed for pain.    Dispense:  90 tablet    Refill:  0    Order Specific Question:   Supervising Provider    Answer:   JEGEDE, OLUGBEMIGA E [[id number]]   Loreen Bankson Moore Anthonyjames Bargar  APRN, MSN, FNP-C Patient Care Center [facility] [address]  [place], [place] [address] [phone]  

## 2022-06-15 ENCOUNTER — Other Ambulatory Visit: Payer: Self-pay | Admitting: Family Medicine

## 2022-06-15 ENCOUNTER — Telehealth: Payer: Self-pay

## 2022-06-15 DIAGNOSIS — G8929 Other chronic pain: Secondary | ICD-10-CM

## 2022-06-15 DIAGNOSIS — F1120 Opioid dependence, uncomplicated: Secondary | ICD-10-CM

## 2022-06-15 DIAGNOSIS — D5700 Hb-SS disease with crisis, unspecified: Secondary | ICD-10-CM

## 2022-06-15 MED ORDER — OXYCODONE HCL 15 MG PO TABS: 15.0000 mg | ORAL_TABLET | ORAL | 0 refills | Status: DC | PRN

## 2022-06-15 MED ORDER — OXYCODONE HCL ER 15 MG PO T12A: 15.0000 mg | EXTENDED_RELEASE_TABLET | Freq: Two times a day (BID) | ORAL | 0 refills | Status: DC

## 2022-06-15 NOTE — Progress Notes (Signed)
Reviewed PDMP substance reporting system prior to prescribing opiate medications. No inconsistencies noted.  Meds ordered this encounter  Medications   oxyCODONE (ROXICODONE) 15 MG immediate release tablet    Sig: Take 1 tablet (15 mg total) by mouth every 4 (four) hours as needed for pain.    Dispense:  90 tablet    Refill:  0    Order Specific Question:   Supervising Provider    Answer:   JEGEDE, OLUGBEMIGA E [[id number]]   oxyCODONE (OXYCONTIN) 15 mg 12 hr tablet    Sig: Take 1 tablet (15 mg total) by mouth every 12 (twelve) hours.    Dispense:  60 tablet    Refill:  0    Order Specific Question:   Supervising Provider    Answer:   JEGEDE, OLUGBEMIGA E [[id number]]   Tiquan Bouch Moore Zamora Colton  APRN, MSN, FNP-C Patient Care Center [facility] [address]  [place], [place] [address] [phone]  

## 2022-06-15 NOTE — Telephone Encounter (Signed)
Gabapentin Oxycodone  Oxy contin

## 2022-06-19 ENCOUNTER — Other Ambulatory Visit: Payer: Self-pay | Admitting: Family Medicine

## 2022-06-19 DIAGNOSIS — G8929 Other chronic pain: Secondary | ICD-10-CM

## 2022-06-30 ENCOUNTER — Other Ambulatory Visit: Payer: Self-pay | Admitting: Family Medicine

## 2022-06-30 DIAGNOSIS — G8929 Other chronic pain: Secondary | ICD-10-CM

## 2022-06-30 MED ORDER — OXYCODONE HCL 15 MG PO TABS: 15.0000 mg | ORAL_TABLET | ORAL | 0 refills | Status: DC | PRN

## 2022-06-30 NOTE — Progress Notes (Signed)
Reviewed PDMP substance reporting system prior to prescribing opiate medications. No inconsistencies noted.  Meds ordered this encounter  Medications   oxyCODONE (ROXICODONE) 15 MG immediate release tablet    Sig: Take 1 tablet (15 mg total) by mouth every 4 (four) hours as needed for pain.    Dispense:  90 tablet    Refill:  0    Order Specific Question:   Supervising Provider    Answer:   JEGEDE, OLUGBEMIGA E [[id number]]   Oluwadara Gorman Moore Kynslie Ringle  APRN, MSN, FNP-C [facility] [address]  [place], [place] [address] [phone]  

## 2022-07-18 ENCOUNTER — Other Ambulatory Visit: Payer: Self-pay | Admitting: Family Medicine

## 2022-07-18 ENCOUNTER — Telehealth: Payer: Self-pay

## 2022-07-18 DIAGNOSIS — F1120 Opioid dependence, uncomplicated: Secondary | ICD-10-CM

## 2022-07-18 DIAGNOSIS — G8929 Other chronic pain: Secondary | ICD-10-CM

## 2022-07-18 DIAGNOSIS — D5700 Hb-SS disease with crisis, unspecified: Secondary | ICD-10-CM

## 2022-07-18 MED ORDER — OXYCODONE HCL 15 MG PO TABS: 15.0000 mg | ORAL_TABLET | ORAL | 0 refills | Status: DC | PRN

## 2022-07-18 MED ORDER — OXYCODONE HCL ER 15 MG PO T12A: 15.0000 mg | EXTENDED_RELEASE_TABLET | Freq: Two times a day (BID) | ORAL | 0 refills | Status: DC

## 2022-07-18 NOTE — Progress Notes (Signed)
Reviewed PDMP substance reporting system prior to prescribing opiate medications. No inconsistencies noted.  Meds ordered this encounter  Medications   oxyCODONE (ROXICODONE) 15 MG immediate release tablet    Sig: Take 1 tablet (15 mg total) by mouth every 4 (four) hours as needed for pain.    Dispense:  90 tablet    Refill:  0    Order Specific Question:   Supervising Provider    Answer:   JEGEDE, OLUGBEMIGA E [[id number]]   oxyCODONE (OXYCONTIN) 15 mg 12 hr tablet    Sig: Take 1 tablet (15 mg total) by mouth every 12 (twelve) hours.    Dispense:  60 tablet    Refill:  0    Order Specific Question:   Supervising Provider    Answer:   JEGEDE, OLUGBEMIGA E [[id number]]   Kara Horan Moore [name]  APRN, MSN, FNP-C [facility] [address]  [place], [place] [address] [phone]  

## 2022-07-18 NOTE — Telephone Encounter (Signed)
Oxycodone Oxy Contin 

## 2022-07-19 ENCOUNTER — Telehealth: Payer: Self-pay | Admitting: Nurse Practitioner

## 2022-07-19 ENCOUNTER — Other Ambulatory Visit: Payer: Self-pay | Admitting: Family Medicine

## 2022-07-19 NOTE — Telephone Encounter (Signed)
Pt called requesting you to change the date on her prescription from the 31st to the 30th so she can get it picked up today.

## 2022-07-19 NOTE — Progress Notes (Signed)
No further prescription changes will be made.   Nolon Nations  APRN, MSN, FNP-C [facility] [address] [place], [place] [address] [phone]

## 2022-07-30 ENCOUNTER — Encounter: Payer: Self-pay | Admitting: Dermatology

## 2022-08-03 ENCOUNTER — Other Ambulatory Visit: Payer: Self-pay | Admitting: Family Medicine

## 2022-08-03 DIAGNOSIS — G8929 Other chronic pain: Secondary | ICD-10-CM

## 2022-08-03 MED ORDER — OXYCODONE HCL 15 MG PO TABS: 15.0000 mg | ORAL_TABLET | ORAL | 0 refills | Status: DC | PRN

## 2022-08-03 NOTE — Progress Notes (Signed)
Reviewed PDMP substance reporting system prior to prescribing opiate medications. No inconsistencies noted.  Meds ordered this encounter  Medications   oxyCODONE (ROXICODONE) 15 MG immediate release tablet    Sig: Take 1 tablet (15 mg total) by mouth every 4 (four) hours as needed for pain.    Dispense:  90 tablet    Refill:  0    Order Specific Question:   Supervising Provider    Answer:   JEGEDE, OLUGBEMIGA E [[id number]]   Kara Retana Moore Dorinne Graeff  APRN, MSN, FNP-C [facility] [address]  [place], [place] [address] [phone]  

## 2022-08-16 ENCOUNTER — Ambulatory Visit: Payer: Medicare Other | Admitting: Dermatology

## 2022-08-18 ENCOUNTER — Other Ambulatory Visit: Payer: Self-pay | Admitting: Nurse Practitioner

## 2022-08-18 ENCOUNTER — Other Ambulatory Visit: Payer: Self-pay | Admitting: Family Medicine

## 2022-08-18 ENCOUNTER — Telehealth: Payer: Self-pay

## 2022-08-18 DIAGNOSIS — D5700 Hb-SS disease with crisis, unspecified: Secondary | ICD-10-CM

## 2022-08-18 DIAGNOSIS — F1120 Opioid dependence, uncomplicated: Secondary | ICD-10-CM

## 2022-08-18 DIAGNOSIS — G8929 Other chronic pain: Secondary | ICD-10-CM

## 2022-08-18 MED ORDER — OXYCODONE HCL ER 15 MG PO T12A: 15.0000 mg | EXTENDED_RELEASE_TABLET | Freq: Two times a day (BID) | ORAL | 0 refills | Status: DC

## 2022-08-18 MED ORDER — OXYCODONE HCL 15 MG PO TABS: 15.0000 mg | ORAL_TABLET | ORAL | 0 refills | Status: DC | PRN

## 2022-08-18 NOTE — Telephone Encounter (Signed)
Oxycodone Oxycontin 

## 2022-08-18 NOTE — Progress Notes (Signed)
Reviewed PDMP substance reporting system prior to prescribing opiate medications. No inconsistencies noted.  Meds ordered this encounter  Medications   oxyCODONE (ROXICODONE) 15 MG immediate release tablet    Sig: Take 1 tablet (15 mg total) by mouth every 4 (four) hours as needed for pain.    Dispense:  90 tablet    Refill:  0    Order Specific Question:   Supervising Provider    Answer:   Tresa Garter [[id number]]   oxyCODONE (OXYCONTIN) 15 mg 12 hr tablet    Sig: Take 1 tablet (15 mg total) by mouth every 12 (twelve) hours.    Dispense:  60 tablet    Refill:  0    Order Specific Question:   Supervising Provider    Answer:   Tresa Garter [[id number]]   Donia Pounds  APRN, MSN, FNP-C [facility] [address] [place], [place] [address] [phone]

## 2022-08-24 ENCOUNTER — Ambulatory Visit: Payer: Self-pay | Admitting: Nurse Practitioner

## 2022-08-29 ENCOUNTER — Other Ambulatory Visit: Payer: Self-pay | Admitting: Family Medicine

## 2022-08-29 DIAGNOSIS — G8929 Other chronic pain: Secondary | ICD-10-CM

## 2022-08-29 MED ORDER — OXYCODONE HCL 15 MG PO TABS: 15.0000 mg | ORAL_TABLET | ORAL | 0 refills | Status: DC | PRN

## 2022-08-29 NOTE — Progress Notes (Signed)
Reviewed PDMP substance reporting system prior to prescribing opiate medications. No inconsistencies noted.  Meds ordered this encounter  Medications   oxyCODONE (ROXICODONE) 15 MG immediate release tablet    Sig: Take 1 tablet (15 mg total) by mouth every 4 (four) hours as needed for pain.    Dispense:  90 tablet    Refill:  0    Order Specific Question:   Supervising Provider    Answer:   JEGEDE, OLUGBEMIGA E [[id number]]   Kara Vane Moore Arinze Rivadeneira  APRN, MSN, FNP-C [facility] [address]  [place], [place] [address] [phone]  

## 2022-08-31 ENCOUNTER — Telehealth: Payer: Self-pay

## 2022-08-31 NOTE — Telephone Encounter (Signed)
Oxycodone  °

## 2022-09-07 ENCOUNTER — Other Ambulatory Visit: Payer: Self-pay

## 2022-09-07 ENCOUNTER — Encounter: Payer: Self-pay | Admitting: Nurse Practitioner

## 2022-09-07 ENCOUNTER — Emergency Department (HOSPITAL_COMMUNITY)
Admission: EM | Admit: 2022-09-07 | Discharge: 2022-09-08 | Disposition: A | Discharge status: Home / Self Care | Payer: Medicare Other | Attending: Emergency Medicine | Admitting: Emergency Medicine

## 2022-09-07 ENCOUNTER — Ambulatory Visit (INDEPENDENT_AMBULATORY_CARE_PROVIDER_SITE_OTHER): Payer: Medicare Other | Admitting: Nurse Practitioner

## 2022-09-07 ENCOUNTER — Encounter (HOSPITAL_COMMUNITY): Payer: Self-pay

## 2022-09-07 DIAGNOSIS — Z20822 Contact with and (suspected) exposure to covid-19: Secondary | ICD-10-CM | POA: Diagnosis not present

## 2022-09-07 DIAGNOSIS — D5700 Hb-SS disease with crisis, unspecified: Secondary | ICD-10-CM | POA: Diagnosis not present

## 2022-09-07 DIAGNOSIS — D72829 Elevated white blood cell count, unspecified: Secondary | ICD-10-CM | POA: Insufficient documentation

## 2022-09-07 DIAGNOSIS — D57219 Sickle-cell/Hb-C disease with crisis, unspecified: Secondary | ICD-10-CM | POA: Diagnosis present

## 2022-09-07 LAB — URINALYSIS, ROUTINE W REFLEX MICROSCOPIC
Bilirubin Urine: NEGATIVE
Glucose, UA: NEGATIVE mg/dL
Hgb urine dipstick: NEGATIVE
Ketones, ur: NEGATIVE mg/dL
Leukocytes,Ua: NEGATIVE
Nitrite: NEGATIVE
Protein, ur: NEGATIVE mg/dL
Specific Gravity, Urine: 1.012 (ref 1.005–1.030)
pH: 5 (ref 5.0–8.0)

## 2022-09-07 LAB — COMPREHENSIVE METABOLIC PANEL
ALT: 16 U/L (ref 0–44)
AST: 36 U/L (ref 15–41)
Albumin: 4.6 g/dL (ref 3.5–5.0)
Alkaline Phosphatase: 48 U/L (ref 38–126)
Anion gap: 8 (ref 5–15)
BUN: 5 mg/dL — ABNORMAL LOW (ref 6–20)
CO2: 24 mmol/L (ref 22–32)
Calcium: 9 mg/dL (ref 8.9–10.3)
Chloride: 106 mmol/L (ref 98–111)
Creatinine, Ser: 0.37 mg/dL — ABNORMAL LOW (ref 0.44–1.00)
GFR, Estimated: >60 mL/min (ref >60)
Glucose, Bld: 96 mg/dL (ref 70–99)
Potassium: 3.5 mmol/L (ref 3.5–5.1)
Sodium: 138 mmol/L (ref 135–145)
Total Bilirubin: 3.6 mg/dL — ABNORMAL HIGH (ref 0.3–1.2)
Total Protein: 7.7 g/dL (ref 6.5–8.1)

## 2022-09-07 LAB — CBC WITH DIFFERENTIAL/PLATELET
Abs Immature Granulocytes: 0.17 10*3/uL — ABNORMAL HIGH (ref 0.00–0.07)
Basophils Absolute: 0.1 10*3/uL (ref 0.0–0.1)
Basophils Relative: 1 %
Eosinophils Absolute: 0.2 10*3/uL (ref 0.0–0.5)
Eosinophils Relative: 1 %
HCT: 20.6 % — ABNORMAL LOW (ref 36.0–46.0)
Hemoglobin: 7.1 g/dL — ABNORMAL LOW (ref 12.0–15.0)
Immature Granulocytes: 1 %
Lymphocytes Relative: 51 %
Lymphs Abs: 8.3 10*3/uL — ABNORMAL HIGH (ref 0.7–4.0)
MCH: 36.8 pg — ABNORMAL HIGH (ref 26.0–34.0)
MCHC: 34.5 g/dL (ref 30.0–36.0)
MCV: 106.7 fL — ABNORMAL HIGH (ref 80.0–100.0)
Monocytes Absolute: 1.4 10*3/uL — ABNORMAL HIGH (ref 0.1–1.0)
Monocytes Relative: 9 %
Neutro Abs: 5.9 10*3/uL (ref 1.7–7.7)
Neutrophils Relative %: 37 %
Platelets: 234 10*3/uL (ref 150–400)
RBC: 1.93 MIL/uL — ABNORMAL LOW (ref 3.87–5.11)
RDW: 22.3 % — ABNORMAL HIGH (ref 11.5–15.5)
WBC: 16 10*3/uL — ABNORMAL HIGH (ref 4.0–10.5)
nRBC: 12.3 % — ABNORMAL HIGH (ref 0.0–0.2)

## 2022-09-07 LAB — RETICULOCYTES
Immature Retic Fract: 48.6 % — ABNORMAL HIGH (ref 2.3–15.9)
RBC.: 1.95 MIL/uL — ABNORMAL LOW (ref 3.87–5.11)
Retic Count, Absolute: 340.5 10*3/uL — ABNORMAL HIGH (ref 19.0–186.0)
Retic Ct Pct: 17.5 % — ABNORMAL HIGH (ref 0.4–3.1)

## 2022-09-07 LAB — I-STAT BETA HCG BLOOD, ED (MC, WL, AP ONLY): I-stat hCG, quantitative: <5 m[IU]/mL (ref <5)

## 2022-09-07 LAB — LIPASE, BLOOD: Lipase: 27 U/L (ref 11–51)

## 2022-09-07 NOTE — Assessment & Plan Note (Signed)
-  Discussed case with Dr. Doreene Burke and advised patient to go to the ED for further evaluation  - patient is agreeable and will transport herself there  Follow up:  Follow up in 3 months or sooner if needed

## 2022-09-07 NOTE — ED Triage Notes (Signed)
Pt reports being sent by sickle cell clinic for hemoglobin of 7.5 and needing fluids and pain meds. Pt went for f/u appt after a SCC

## 2022-09-07 NOTE — Patient Instructions (Signed)
1. Sickle cell crisis ([place])  -Discussed case with Dr. Doreene Burke and advised patient to go to the ED for further evaluation  - patient is agreeable and will transport herself there  Follow up:  Follow up in 3 months or sooner if needed

## 2022-09-07 NOTE — Progress Notes (Signed)
@Patient  ID: , female    DOB: [birth year], 36 y.o.   MRN: [id number]  Chief Complaint  Patient presents with   Emesis   Ear Pain   Sickle Cell Pain Crisis   Medication Refill    Referring provider: [id number], NP   HPI  Kara [name] 36 y.o. female  has a past medical history of Arthritis, Chronic migraine, GERD (gastroesophageal reflux disease), cholecystectomy (2015), Pulmonary hypertension (HCC), Sickle cell anemia (HCC), and Tendinitis.  Patient presents today for an acute visit.  She states for the past week she has been having a sickle cell crisis.  She states that she has been very weak and unable to get on the bed.  She states that she has had vomiting, ear pain, generalized body pain weakness.  Patient was advised to go to the ED for further evaluation.   Note: Patient states that she does need to move her PCP closer to where she lives now in 07-09-1984.  She states that she went to a hospital in [facility] 4 weeks ago for sickle cell crisis.  She may need to find a doctor in the healthcare system for sickle cell.  We will speak to our referral coordinator to see if we can find her a doctor's office.  Denies f/c/s, n/v/d, hemoptysis, PND, leg swelling Denies chest pain or edema       Allergies  Allergen Reactions   Augmentin [Amoxicillin-Pot Clavulanate] Diarrhea and Nausea And Vomiting   Kiwi Extract Hives   Morphine And Related Hives   Paroxetine Hives    Immunization History  Administered Date(s) Administered   Influenza Split 09/18/2014   Tdap 01/09/2022    Past Medical History:  Diagnosis Date   Arthritis    Chronic migraine    GERD (gastroesophageal reflux disease)    Hx of cholecystectomy 2015   Pulmonary hypertension (HCC)    Sickle cell anemia (HCC)    Tendinitis    left elbow    Tobacco History: Social History   Tobacco Use  Smoking Status Every Day   Packs/day: 0.50    Types: Cigarettes  Smokeless Tobacco Never   Ready to quit: Not Answered Counseling given: Not Answered   Outpatient Encounter Medications as of 09/07/2022  Medication Sig   cetirizine (ZYRTEC) 10 MG tablet Take 1 tablet (10 mg total) by mouth daily.   gabapentin (NEURONTIN) 300 MG capsule TAKE 1 CAPSULE BY MOUTH THREE TIMES A DAY   mometasone (ELOCON) 0.1 % cream Apply 1 application. topically daily as needed (Rash). Avoid applying to face, groin, and axilla. Use as directed. Long-term use can cause thinning of the skin.   naproxen (NAPROSYN) 500 MG tablet Take 1 tablet (500 mg total) by mouth 3 (three) times daily with meals as needed.   omeprazole (PRILOSEC) 40 MG capsule Take 1 capsule (40 mg total) by mouth daily.   oxyCODONE (OXYCONTIN) 15 mg 12 hr tablet Take 1 tablet (15 mg total) by mouth every 12 (twelve) hours.   oxyCODONE (ROXICODONE) 15 MG immediate release tablet Take 1 tablet (15 mg total) by mouth every 4 (four) hours as needed for pain.   folic acid (FOLVITE) 1 MG tablet Take 1 tablet (1 mg total) by mouth daily. (Patient not taking: Reported on 09/07/2022)   No facility-administered encounter medications on file as of 09/07/2022.     Review of Systems  Review of Systems  Constitutional: Negative.   HENT: Negative.  Cardiovascular: Negative.   Gastrointestinal: Negative.   Allergic/Immunologic: Negative.   Neurological: Negative.   Psychiatric/Behavioral: Negative.         Physical Exam  BP 118/85   Pulse 83   Ht 5' 4.5" (1.638 m)   Wt 119 lb 6.4 oz (54.2 kg)   SpO2 98%   BMI 20.18 kg/m   Wt Readings from Last 5 Encounters:  09/07/22 119 lb 6.4 oz (54.2 kg)  04/11/22 122 lb 9.2 oz (55.6 kg)  04/11/22 121 lb 3.2 oz (55 kg)  03/27/22 122 lb 6 oz (55.5 kg)  01/09/22 127 lb 0.6 oz (57.6 kg)     Physical Exam Vitals and nursing note reviewed.  Constitutional:      General: She is not in acute distress.    Appearance: She is well-developed.   Cardiovascular:     Rate and Rhythm: Normal rate and regular rhythm.  Pulmonary:     Effort: Pulmonary effort is normal.     Breath sounds: Normal breath sounds.  Neurological:     Mental Status: She is alert and oriented to person, place, and time.       Assessment & Plan:   Sickle cell crisis ([place]) -Discussed case with Dr. Doreene Burke and advised patient to go to the ED for further evaluation  - patient is agreeable and will transport herself there  Follow up:  Follow up in 3 months or sooner if needed     Fenton Foy, NP 09/07/2022

## 2022-09-07 NOTE — ED Provider Triage Note (Signed)
Emergency Medicine Provider Triage Evaluation Note  Kara [name] , a 36 y.o. female  was evaluated in triage.  Pt complains of sickle cell crisis.  Pain started all over on the 15th of this month.  She is also endorsing fever nausea.  Started to have epigastric abdominal pain started 2 days ago.  She had a hard time keeping anything down.  Denies chest pain shortness of breath.  She was recently evaluated on October 2 for another pain crisis.  Received fluids and Dilaudid and symptoms improved and she was discharged from the emergency room.  Taking oxycodone for pain symptoms at home.  Has not helped much per patient.  Review of Systems  Positive: See above Negative: See above  Physical Exam  BP 121/66 (BP Location: Left Arm)   Pulse 86   Temp 98.7 F (37.1 C) (Oral)   Resp 16   LMP 08/09/2022   SpO2 94%  Gen:   Awake, no distress   Resp:  Normal effort  MSK:   Moves extremities without difficulty  Other:    Medical Decision Making  Medically screening exam initiated at 5:55 PM.  Appropriate orders placed.  [name] was informed that the remainder of the evaluation will be completed by another provider, this initial triage assessment does not replace that evaluation, and the importance of remaining in the ED until their evaluation is complete.  Work up started   Harriet Pho, PA-C 09/07/22 1758

## 2022-09-08 ENCOUNTER — Telehealth (HOSPITAL_COMMUNITY): Payer: Self-pay | Admitting: *Deleted

## 2022-09-08 ENCOUNTER — Non-Acute Institutional Stay: Admit: 2022-09-08 | Payer: Medicare Other | Admitting: Internal Medicine

## 2022-09-08 ENCOUNTER — Emergency Department (HOSPITAL_COMMUNITY): Payer: Medicare Other

## 2022-09-08 DIAGNOSIS — D57219 Sickle-cell/Hb-C disease with crisis, unspecified: Secondary | ICD-10-CM | POA: Diagnosis not present

## 2022-09-08 LAB — RESP PANEL BY RT-PCR (FLU A&B, COVID) ARPGX2
Influenza A by PCR: NEGATIVE
Influenza B by PCR: NEGATIVE
SARS Coronavirus 2 by RT PCR: NEGATIVE

## 2022-09-08 MED ORDER — HYDROMORPHONE HCL 2 MG/ML IJ SOLN
2.0000 mg | INTRAMUSCULAR | Status: AC
  Administered 2022-09-08: 2 mg via INTRAVENOUS
  Filled 2022-09-08: qty 1

## 2022-09-08 MED ORDER — DIPHENHYDRAMINE HCL 25 MG PO CAPS
25.0000 mg | ORAL_CAPSULE | ORAL | Status: DC | PRN
  Administered 2022-09-08: 50 mg via ORAL
  Filled 2022-09-08: qty 2

## 2022-09-08 MED ORDER — SODIUM CHLORIDE 0.45 % IV SOLN: INTRAVENOUS | Status: DC

## 2022-09-08 MED ORDER — KETOROLAC TROMETHAMINE 15 MG/ML IJ SOLN
15.0000 mg | Freq: Once | INTRAMUSCULAR | Status: AC
  Administered 2022-09-08: 15 mg via INTRAVENOUS
  Filled 2022-09-08: qty 1

## 2022-09-08 NOTE — Telephone Encounter (Signed)
Patient came to the lobby of [facility] for triage.  Patient was recently discharged from the ED and reports that she is still having generalized achy joint pain rated 7/10. Patient also reports fever with night sweats, chest pain, nausea, vomiting, diarrhea and abdominal pain. [place], [place] spoke with patient and planned to admit patient to the day hospital in order to direct admit patient to inpatient unit at [facility].  During check-in to the day hospital, patient reported that she was unable to stay. Patient then left the day hospital lobby. [place], [place] notified.

## 2022-09-08 NOTE — Discharge Instructions (Signed)
Go to the sickle cell clinic this morning to be seen.
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
## 2022-09-08 NOTE — ED Provider Notes (Signed)
[facility]-EMERGENCY DEPT Provider Note   CSN: [id number] Arrival date & time: 09/07/22  1707     History  Chief Complaint  Patient presents with   Sickle Cell Pain Crisis    Kara [name] is a 36 y.o. female.  36 yo F with a chief complaints of sickle cell pain crisis.  This been going on for about 15 days now.  She tells me this started earlier this month and she went to an outside ED and was treated and had some transient improvement.  Reoccurred about a week and a half ago and has had persistent and worsening symptoms.  She has been having fevers and chest pain.  She has not been able to get out of bed in the past couple days.  She actually feels a bit better today than she has previously.  She went to her sickle cell clinic who encouraged her to come to the emergency department for evaluation.   Sickle Cell Pain Crisis      Home Medications Prior to Admission medications   Medication Sig Start Date End Date Taking? Authorizing Provider  cetirizine (ZYRTEC) 10 MG tablet Take 1 tablet (10 mg total) by mouth daily. 04/07/22   Passmore, Enid Derry I, NP  folic acid (FOLVITE) 1 MG tablet Take 1 tablet (1 mg total) by mouth daily. Patient not taking: Reported on 09/07/2022 04/19/22   Massie Maroon, FNP  gabapentin (NEURONTIN) 300 MG capsule TAKE 1 CAPSULE BY MOUTH THREE TIMES A DAY 06/21/22   Ivonne Andrew, NP  mometasone (ELOCON) 0.1 % cream Apply 1 application. topically daily as needed (Rash). Avoid applying to face, groin, and axilla. Use as directed. Long-term use can cause thinning of the skin. 04/25/22   Willeen Niece, MD  naproxen (NAPROSYN) 500 MG tablet Take 1 tablet (500 mg total) by mouth 3 (three) times daily with meals as needed. 09/21/21   Passmore, Enid Derry I, NP  omeprazole (PRILOSEC) 40 MG capsule Take 1 capsule (40 mg total) by mouth daily. 04/19/22   Massie Maroon, FNP  oxyCODONE (OXYCONTIN) 15 mg 12 hr tablet Take 1 tablet (15 mg total)  by mouth every 12 (twelve) hours. 08/18/22   Massie Maroon, FNP  oxyCODONE (ROXICODONE) 15 MG immediate release tablet Take 1 tablet (15 mg total) by mouth every 4 (four) hours as needed for pain. 09/01/22   Massie Maroon, FNP      Allergies    Augmentin [amoxicillin-pot clavulanate], Kiwi extract, Morphine and related, and Paroxetine    Review of Systems   Review of Systems  Physical Exam Updated Vital Signs BP 111/66   Pulse 85   Temp 98.6 F (37 C) (Oral)   Resp 20   LMP 08/09/2022   SpO2 96%  Physical Exam Vitals and nursing note reviewed.  Constitutional:      General: She is not in acute distress.    Appearance: She is well-developed. She is not diaphoretic.  HENT:     Head: Normocephalic and atraumatic.  Eyes:     Pupils: Pupils are equal, round, and reactive to light.  Cardiovascular:     Rate and Rhythm: Normal rate and regular rhythm.     Heart sounds: No murmur heard.    No friction rub. No gallop.  Pulmonary:     Effort: Pulmonary effort is normal.     Breath sounds: No wheezing or rales.  Abdominal:     General: There is no distension.  Palpations: Abdomen is soft.     Tenderness: There is no abdominal tenderness.  Musculoskeletal:        General: No tenderness.     Cervical back: Normal range of motion and neck supple.  Skin:    General: Skin is warm and dry.  Neurological:     Mental Status: She is alert and oriented to person, place, and time.  Psychiatric:        Behavior: Behavior normal.     ED Results / Procedures / Treatments   Labs (all labs ordered are listed, but only abnormal results are displayed) Labs Reviewed  COMPREHENSIVE METABOLIC PANEL - Abnormal; Notable for the following components:      Result Value   BUN 5 (*)    Creatinine, Ser 0.37 (*)    Total Bilirubin 3.6 (*)    All other components within normal limits  CBC WITH DIFFERENTIAL/PLATELET - Abnormal; Notable for the following components:   WBC 16.0 (*)     RBC 1.93 (*)    Hemoglobin 7.1 (*)    HCT 20.6 (*)    MCV 106.7 (*)    MCH 36.8 (*)    RDW 22.3 (*)    nRBC 12.3 (*)    Lymphs Abs 8.3 (*)    Monocytes Absolute 1.4 (*)    Abs Immature Granulocytes 0.17 (*)    All other components within normal limits  RETICULOCYTES - Abnormal; Notable for the following components:   Retic Ct Pct 17.5 (*)    RBC. 1.95 (*)    Retic Count, Absolute 340.5 (*)    Immature Retic Fract 48.6 (*)    All other components within normal limits  RESP PANEL BY RT-PCR (FLU A&B, COVID) ARPGX2  LIPASE, BLOOD  URINALYSIS, ROUTINE W REFLEX MICROSCOPIC  I-STAT BETA HCG BLOOD, ED ([facility], [facility], [facility] ONLY)    EKG None  Radiology DG Chest Port 1 View  Result Date: 09/08/2022 CLINICAL DATA:  Chest pains and fever.  Sickle cell crisis. EXAM: PORTABLE CHEST 1 VIEW COMPARISON:  PA Lat 08/26/2021 FINDINGS: There is mild cardiomegaly. The vascular markings are normal caliber. No edema is seen. The lungs are clear with mild elevation right hemidiaphragm. No pleural effusion is evident. Intact thoracic cage noted with multiple overlying monitor wires. IMPRESSION: No acute radiographic chest findings. Stable chest with cardiomegaly. Electronically Signed   By: Telford Nab M.D.   On: 09/08/2022 05:20    Procedures Procedures    Medications Ordered in ED Medications  diphenhydrAMINE (BENADRYL) capsule 25-50 mg (50 mg Oral Given 09/08/22 0505)  0.45 % sodium chloride infusion (has no administration in time range)  HYDROmorphone (DILAUDID) injection 2 mg (2 mg Intravenous Given 09/08/22 0442)  HYDROmorphone (DILAUDID) injection 2 mg (2 mg Intravenous Given 09/08/22 0526)  HYDROmorphone (DILAUDID) injection 2 mg (2 mg Intravenous Given 09/08/22 4098)  ketorolac (TORADOL) 15 MG/ML injection 15 mg (15 mg Intravenous Given 09/08/22 0505)    ED Course/ Medical Decision Making/ A&P                           Medical Decision Making Amount and/or Complexity of Data  Reviewed Radiology: ordered.  Risk Prescription drug management.   36 yo F with a chief complaints of sickle cell pain crisis.  Is endorsing some viral-like symptoms as well.  Going on for a few weeks now.  She was seen in the sickle cell clinic and was sent here for evaluation.  Unfortunately the patient waited about 11 hours to be seen.  She appears well and is nontoxic.  I reviewed her blood work, her hemoglobin is below her baseline of 8.5.  She is not pregnant, LFTs unremarkable.  Mild leukocytosis of 16,000.  Reticulocyte counts are elevated.  UA negative for infection.  Will obtain a chest x-ray to assess for acute chest.  COVID and flu testing.  Treat pain aggressively per protocol.  Patient is feeling a bit better but with like to have further pain control.  I discussed coming into the hospital which she is declining.  Is a sickle cell clinic is about open she would like to go over there.  We will place her up for discharge.  6:59 AM:  I have discussed the diagnosis/risks/treatment options with the patient and neighbor .  Evaluation and diagnostic testing in the emergency department does not suggest an emergent condition requiring admission or immediate intervention beyond what has been performed at this time.  They will follow up with Sickle cell clinic. We also discussed returning to the ED immediately if new or worsening sx occur. We discussed the sx which are most concerning (e.g., sudden worsening pain, fever, inability to tolerate by mouth) that necessitate immediate return. Medications administered to the patient during their visit and any new prescriptions provided to the patient are listed below.  Medications given during this visit Medications  diphenhydrAMINE (BENADRYL) capsule 25-50 mg (50 mg Oral Given 09/08/22 0505)  0.45 % sodium chloride infusion (has no administration in time range)  HYDROmorphone (DILAUDID) injection 2 mg (2 mg Intravenous Given 09/08/22 0442)   HYDROmorphone (DILAUDID) injection 2 mg (2 mg Intravenous Given 09/08/22 0526)  HYDROmorphone (DILAUDID) injection 2 mg (2 mg Intravenous Given 09/08/22 0633)  ketorolac (TORADOL) 15 MG/ML injection 15 mg (15 mg Intravenous Given 09/08/22 0505)     The patient appears reasonably screen and/or stabilized for discharge and I doubt any other medical condition or other [facility] requiring further screening, evaluation, or treatment in the ED at this time prior to discharge.          Final Clinical Impression(s) / ED Diagnoses Final diagnoses:  Sickle cell pain crisis [facility])    Rx / DC Orders ED Discharge Orders     None         Melene Plan, DO 09/08/22 [phone]

## 2022-09-11 ENCOUNTER — Ambulatory Visit: Payer: Self-pay | Admitting: Nurse Practitioner

## 2022-09-12 ENCOUNTER — Other Ambulatory Visit: Payer: Self-pay | Admitting: Family Medicine

## 2022-09-12 ENCOUNTER — Telehealth: Payer: Self-pay

## 2022-09-12 DIAGNOSIS — D5700 Hb-SS disease with crisis, unspecified: Secondary | ICD-10-CM

## 2022-09-12 DIAGNOSIS — F1120 Opioid dependence, uncomplicated: Secondary | ICD-10-CM

## 2022-09-12 DIAGNOSIS — G8929 Other chronic pain: Secondary | ICD-10-CM

## 2022-09-12 MED ORDER — OXYCODONE HCL ER 15 MG PO T12A: 15.0000 mg | EXTENDED_RELEASE_TABLET | Freq: Two times a day (BID) | ORAL | 0 refills | Status: DC

## 2022-09-12 MED ORDER — OXYCODONE HCL 15 MG PO TABS: 15.0000 mg | ORAL_TABLET | ORAL | 0 refills | Status: DC | PRN

## 2022-09-12 NOTE — Telephone Encounter (Signed)
Oxycodone  Oxy contin  Naproxen

## 2022-09-12 NOTE — Progress Notes (Signed)
Reviewed PDMP substance reporting system prior to prescribing opiate medications. No inconsistencies noted.  Meds ordered this encounter  Medications   oxyCODONE (ROXICODONE) 15 MG immediate release tablet    Sig: Take 1 tablet (15 mg total) by mouth every 4 (four) hours as needed for pain.    Dispense:  90 tablet    Refill:  0    Order Specific Question:   Supervising Provider    Answer:   JEGEDE, OLUGBEMIGA E [[id number]]   oxyCODONE (OXYCONTIN) 15 mg 12 hr tablet    Sig: Take 1 tablet (15 mg total) by mouth every 12 (twelve) hours.    Dispense:  60 tablet    Refill:  0    Order Specific Question:   Supervising Provider    Answer:   JEGEDE, OLUGBEMIGA E [[id number]]   Ita Fritzsche Moore Daymen Hassebrock  APRN, MSN, FNP-C [facility] [address]  [place], [place] [address] [phone]  

## 2022-09-15 ENCOUNTER — Other Ambulatory Visit: Payer: Self-pay | Admitting: Nurse Practitioner

## 2022-09-27 ENCOUNTER — Other Ambulatory Visit: Payer: Self-pay | Admitting: Nurse Practitioner

## 2022-09-28 ENCOUNTER — Telehealth: Payer: Self-pay | Admitting: Nurse Practitioner

## 2022-09-28 ENCOUNTER — Other Ambulatory Visit: Payer: Self-pay | Admitting: Nurse Practitioner

## 2022-09-28 NOTE — Telephone Encounter (Signed)
Oxycodone 15

## 2022-09-29 ENCOUNTER — Other Ambulatory Visit: Payer: Self-pay | Admitting: Nurse Practitioner

## 2022-09-29 DIAGNOSIS — F1120 Opioid dependence, uncomplicated: Secondary | ICD-10-CM

## 2022-09-29 DIAGNOSIS — D5700 Hb-SS disease with crisis, unspecified: Secondary | ICD-10-CM

## 2022-09-29 DIAGNOSIS — G8929 Other chronic pain: Secondary | ICD-10-CM

## 2022-09-29 MED ORDER — OXYCODONE HCL ER 15 MG PO T12A: 15.0000 mg | EXTENDED_RELEASE_TABLET | Freq: Two times a day (BID) | ORAL | 0 refills | Status: DC

## 2022-09-29 MED ORDER — OXYCODONE HCL 15 MG PO TABS: 15.0000 mg | ORAL_TABLET | ORAL | 0 refills | Status: DC | PRN

## 2022-09-29 NOTE — Progress Notes (Signed)
PDMP reviewed. Sickle cell pain meds refilled.

## 2022-10-11 ENCOUNTER — Telehealth: Payer: Self-pay | Admitting: Nurse Practitioner

## 2022-10-11 NOTE — Telephone Encounter (Signed)
Oxycodone 15 mg refill request  

## 2022-10-16 ENCOUNTER — Ambulatory Visit: Payer: Medicare Other | Admitting: Dermatology

## 2022-10-16 ENCOUNTER — Other Ambulatory Visit: Payer: Self-pay | Admitting: Nurse Practitioner

## 2022-10-16 DIAGNOSIS — G8929 Other chronic pain: Secondary | ICD-10-CM

## 2022-10-16 MED ORDER — OXYCODONE HCL 15 MG PO TABS: 15.0000 mg | ORAL_TABLET | ORAL | 0 refills | Status: DC | PRN

## 2022-10-27 ENCOUNTER — Telehealth: Payer: Self-pay | Admitting: Nurse Practitioner

## 2022-10-27 ENCOUNTER — Other Ambulatory Visit: Payer: Self-pay | Admitting: Nurse Practitioner

## 2022-10-27 NOTE — Telephone Encounter (Signed)
Pt called to remind you her Oxycodone was due on Monday   Naproxen refill request

## 2022-10-30 ENCOUNTER — Other Ambulatory Visit: Payer: Self-pay | Admitting: Nurse Practitioner

## 2022-10-30 DIAGNOSIS — D571 Sickle-cell disease without crisis: Secondary | ICD-10-CM

## 2022-10-30 DIAGNOSIS — G8929 Other chronic pain: Secondary | ICD-10-CM

## 2022-10-30 MED ORDER — NAPROXEN 500 MG PO TABS: 500.0000 mg | ORAL_TABLET | Freq: Three times a day (TID) | ORAL | 3 refills | Status: DC | PRN

## 2022-10-30 MED ORDER — OXYCODONE HCL 15 MG PO TABS: 15.0000 mg | ORAL_TABLET | ORAL | 0 refills | Status: DC | PRN

## 2022-10-30 NOTE — Telephone Encounter (Signed)
Please advise KH 

## 2022-11-09 ENCOUNTER — Other Ambulatory Visit: Payer: Self-pay

## 2022-11-09 DIAGNOSIS — D5700 Hb-SS disease with crisis, unspecified: Secondary | ICD-10-CM

## 2022-11-09 DIAGNOSIS — F1120 Opioid dependence, uncomplicated: Secondary | ICD-10-CM

## 2022-11-09 DIAGNOSIS — G8929 Other chronic pain: Secondary | ICD-10-CM

## 2022-11-09 NOTE — Telephone Encounter (Signed)
Please advise KH 

## 2022-11-09 NOTE — Telephone Encounter (Signed)
From: [name] To: Office of Ivonne Andrew, NP Sent: 11/08/2022 6:51 PM EST Subject: Medication Renewal Request  Refills have been requested for the following medications:   oxyCODONE (OXYCONTIN) 15 mg 12 hr tablet [[facility]]  Patient Comment: Due Monday..can't transfer care Bec of Medicare/Medicaid so I'll still be seeing you guys   oxyCODONE (ROXICODONE) 15 MG immediate release tablet [Tonya S Nichols]  Patient Comment: Due Monday.. can't transfer care because of Medicare/ Medicaid so I'll still be seeing you all  Preferred pharmacy: [id number] - [place], [place] - [address] Delivery method: [employer]

## 2022-11-10 ENCOUNTER — Other Ambulatory Visit: Payer: Self-pay | Admitting: Nurse Practitioner

## 2022-11-10 DIAGNOSIS — G8929 Other chronic pain: Secondary | ICD-10-CM

## 2022-11-10 MED ORDER — OXYCODONE HCL 15 MG PO TABS: 15.0000 mg | ORAL_TABLET | ORAL | 0 refills | Status: DC | PRN

## 2022-11-14 ENCOUNTER — Telehealth: Payer: Self-pay | Admitting: Nurse Practitioner

## 2022-11-14 ENCOUNTER — Other Ambulatory Visit: Payer: Self-pay | Admitting: Internal Medicine

## 2022-11-14 DIAGNOSIS — F1120 Opioid dependence, uncomplicated: Secondary | ICD-10-CM

## 2022-11-14 DIAGNOSIS — D5700 Hb-SS disease with crisis, unspecified: Secondary | ICD-10-CM

## 2022-11-14 MED ORDER — OXYCODONE HCL ER 15 MG PO T12A: 15.0000 mg | EXTENDED_RELEASE_TABLET | Freq: Two times a day (BID) | ORAL | 0 refills | Status: DC

## 2022-11-14 NOTE — Telephone Encounter (Signed)
Caller & Relationship to patient:  MRN #  [id number]   Call Back Number: [phone]  Date of Last Office Visit: 11/09/2022     Date of Next Office Visit: Visit date not found    Medication(s) to be Refilled: Oxycodone 15mg  extended release was not sent to pharmacy with other pain med  [facility] [place] [place]  ** Please notify patient to allow 48-72 hours to process** **Let patient know to contact pharmacy at the end of the day to make sure medication is ready. ** **If patient has not been seen in a year or longer, book an appointment **Advise to use MyChart for refill requests OR to contact their pharmacy

## 2022-11-16 ENCOUNTER — Telehealth (HOSPITAL_COMMUNITY): Payer: Self-pay | Admitting: *Deleted

## 2022-11-16 ENCOUNTER — Telehealth: Payer: Self-pay

## 2022-11-16 NOTE — Telephone Encounter (Signed)
Patient called requesting to come to the day hospital for sickle cell pain. Patient reports right side pain rated 7/10. Reports taking Oxycodone yesterday. Patient is out of pain medication but can pick them up from pharmacy today. COVID-19 screening done and patient denies all symptoms and exposures. Denies fever, chest pain, nausea, vomiting, diarrhea and abdominal pain. The day hospital is now at capacity and unable to accept additional patients. Patient advised to pick up pain medications and try to manage pain at home. If unable to manage pain at home, patient advised to go to the ED. Patient expresses an understanding.

## 2022-11-16 NOTE — Telephone Encounter (Signed)
Script faxed over requesting a 90 day supply of oxycontin 15 mg. Please send in if you agree or advise if you do not. Thank you. KH

## 2022-11-17 ENCOUNTER — Other Ambulatory Visit: Payer: Self-pay | Admitting: Nurse Practitioner

## 2022-11-29 ENCOUNTER — Other Ambulatory Visit: Payer: Self-pay

## 2022-11-29 DIAGNOSIS — G8929 Other chronic pain: Secondary | ICD-10-CM

## 2022-11-29 NOTE — Telephone Encounter (Signed)
From: [name] To: Office of Fenton Foy, NP Sent: 11/29/2022 2:35 PM EST Subject: Medication Renewal Request  Refills have been requested for the following medications:   oxyCODONE (ROXICODONE) 15 MG immediate release tablet Kenney Houseman S Nichols]  Patient Comment: Due Friday   [id number] [place], [place] - [address] Delivery method: [employer]

## 2022-11-29 NOTE — Telephone Encounter (Signed)
Please advise KH 

## 2022-11-30 ENCOUNTER — Telehealth: Payer: Self-pay | Admitting: Nurse Practitioner

## 2022-11-30 ENCOUNTER — Other Ambulatory Visit: Payer: Self-pay | Admitting: Nurse Practitioner

## 2022-11-30 MED ORDER — OXYCODONE HCL 15 MG PO TABS: 15.0000 mg | ORAL_TABLET | ORAL | 0 refills | Status: DC | PRN

## 2022-11-30 NOTE — Telephone Encounter (Signed)
Lvm to advise pt to check with her pharmacy. Gate

## 2022-11-30 NOTE — Telephone Encounter (Signed)
She was prescribed the same amount that she usually gets. Thanks.

## 2022-11-30 NOTE — Telephone Encounter (Signed)
Pt called and Wants a CALL back on the reason of the quantity of her OXY.   She wants to speak with provider!!

## 2022-12-01 ENCOUNTER — Telehealth: Payer: Self-pay | Admitting: Nurse Practitioner

## 2022-12-01 NOTE — Telephone Encounter (Signed)
Pt requesting for oxycodone 15 mg immediate release prescription to be corrected. Pt states the quantity has not been correct the last 2 times.

## 2022-12-01 NOTE — Telephone Encounter (Signed)
Pt has appt next Wednesday 1/17 with Kenney Houseman

## 2022-12-01 NOTE — Telephone Encounter (Signed)
Just spoke to pt and she is not changing to a provider in [place] because she has [place]

## 2022-12-01 NOTE — Telephone Encounter (Signed)
I sent a message on this yesterday the quantity is correct. I have a note in the chart that this patient was switching to a doctor in [place] (closer to where she lives). Patient does not have a follow-up visit scheduled at our office.  If she is going to continue to come to our office she will need appointment before any further refills.  If she is moving to an office in [place] she will also not get any further refills thanks. Please call the patient to verify. Thanks.

## 2022-12-06 ENCOUNTER — Ambulatory Visit: Payer: Self-pay | Admitting: Nurse Practitioner

## 2022-12-07 ENCOUNTER — Ambulatory Visit (INDEPENDENT_AMBULATORY_CARE_PROVIDER_SITE_OTHER): Payer: Medicare Other | Admitting: Nurse Practitioner

## 2022-12-07 ENCOUNTER — Encounter: Payer: Self-pay | Admitting: Nurse Practitioner

## 2022-12-07 ENCOUNTER — Other Ambulatory Visit (HOSPITAL_COMMUNITY): Payer: Self-pay

## 2022-12-07 VITALS — BP 112/58 | HR 84 | Temp 97.8°F | Wt 119.2 lb

## 2022-12-07 DIAGNOSIS — D571 Sickle-cell disease without crisis: Secondary | ICD-10-CM | POA: Diagnosis not present

## 2022-12-07 DIAGNOSIS — F1120 Opioid dependence, uncomplicated: Secondary | ICD-10-CM | POA: Diagnosis not present

## 2022-12-07 DIAGNOSIS — D5700 Hb-SS disease with crisis, unspecified: Secondary | ICD-10-CM

## 2022-12-07 DIAGNOSIS — G8929 Other chronic pain: Secondary | ICD-10-CM | POA: Diagnosis not present

## 2022-12-07 MED ORDER — OXYCODONE HCL ER 15 MG PO T12A
15.0000 mg | EXTENDED_RELEASE_TABLET | Freq: Two times a day (BID) | ORAL | 0 refills | Status: DC
  Filled 2022-12-07 – 2022-12-14 (×2): qty 30, 15d supply, fill #0

## 2022-12-07 MED ORDER — OXYCODONE HCL 15 MG PO TABS
15.0000 mg | ORAL_TABLET | ORAL | 0 refills | Status: DC | PRN
  Filled 2022-12-07: qty 90, 15d supply, fill #0

## 2022-12-07 NOTE — Progress Notes (Unsigned)
@Patient  ID: Kara [name], female    DOB: Aug 18, 1986, 37 y.o.   MRN: [id number]  Chief Complaint  Patient presents with   Follow-up    Sickle cell    Referring provider: Fenton Foy, NP   HPI  Kara [name] 37 y.o. female  has a past medical history of Arthritis, Chronic migraine, GERD (gastroesophageal reflux disease), cholecystectomy (2015), Pulmonary hypertension ([place]), Sickle cell anemia ([place]), and Tendinitis.   Patient was seen at [facility] for sickle cell pain management and was prescribed controlled substances even though she was requesting them from this office as well. We will no longer prescribe patient pain medications.  Prescribed pain meds on 10/31/22  Drug screen staying with [facility]  Last pain pill today  Almira Coaster      Allergies  Allergen Reactions   Augmentin [Amoxicillin-Pot Clavulanate] Diarrhea and Nausea And Vomiting   Kiwi Extract Hives   Morphine And Related Hives   Paroxetine Hives    Immunization History  Administered Date(s) Administered   Influenza Split 09/18/2014   Tdap 01/09/2022    Past Medical History:  Diagnosis Date   Arthritis    Chronic migraine    GERD (gastroesophageal reflux disease)    Hx of cholecystectomy 2015   Pulmonary hypertension (HCC)    Sickle cell anemia (HCC)    Tendinitis    left elbow    Tobacco History: Social History   Tobacco Use  Smoking Status Every Day   Packs/day: 0.50   Types: Cigarettes  Smokeless Tobacco Never   Ready to quit: Not Answered Counseling given: Not Answered   Outpatient Encounter Medications as of 12/07/2022  Medication Sig   cetirizine (ZYRTEC) 10 MG tablet Take 1 tablet (10 mg total) by mouth daily.   gabapentin (NEURONTIN) 300 MG capsule TAKE 1 CAPSULE BY MOUTH THREE TIMES A DAY   naproxen (NAPROSYN) 500 MG tablet Take 1 tablet (500 mg total) by mouth 3 (three) times daily with meals as needed.   omeprazole (PRILOSEC) 40 MG capsule Take 1 capsule (40 mg  total) by mouth daily.   [DISCONTINUED] oxyCODONE (OXYCONTIN) 15 mg 12 hr tablet Take 1 tablet (15 mg total) by mouth every 12 (twelve) hours.   [DISCONTINUED] oxyCODONE (ROXICODONE) 15 MG immediate release tablet Take 1 tablet (15 mg total) by mouth every 4 (four) hours as needed for pain.   folic acid (FOLVITE) 1 MG tablet Take 1 tablet (1 mg total) by mouth daily. (Patient not taking: Reported on 09/07/2022)   mometasone (ELOCON) 0.1 % cream Apply 1 application. topically daily as needed (Rash). Avoid applying to face, groin, and axilla. Use as directed. Long-term use can cause thinning of the skin. (Patient not taking: Reported on 12/07/2022)   oxyCODONE (OXYCONTIN) 15 mg 12 hr tablet Take 1 tablet (15 mg total) by mouth every 12 (twelve) hours.   oxyCODONE (ROXICODONE) 15 MG immediate release tablet Take 1 tablet (15 mg total) by mouth every 4 (four) hours as needed for pain.   No facility-administered encounter medications on file as of 12/07/2022.     Review of Systems  Review of Systems     Physical Exam  BP (!) 112/58   Pulse 84   Temp 97.8 F (36.6 C)   Wt 119 lb 3.2 oz (54.1 kg)   SpO2 98%   BMI 20.14 kg/m   Wt Readings from Last 5 Encounters:  12/07/22 119 lb 3.2 oz (54.1 kg)  09/07/22 119 lb 6.4 oz (54.2 kg)  04/11/22 122 lb 9.2 oz (55.6 kg)  04/11/22 121 lb 3.2 oz (55 kg)  03/27/22 122 lb 6 oz (55.5 kg)     Physical Exam   Lab Results:  CBC    Component Value Date/Time   WBC 16.0 (H) 09/07/2022 1814   RBC 1.93 (L) 09/07/2022 1814   RBC 1.95 (L) 09/07/2022 1814   HGB 7.1 (L) 09/07/2022 1814   HGB 8.6 (L) 03/27/2022 1541   HCT 20.6 (L) 09/07/2022 1814   HCT 25.4 (L) 03/27/2022 1541   PLT 234 09/07/2022 1814   PLT 359 03/27/2022 1541   MCV 106.7 (H) 09/07/2022 1814   MCV 105 (H) 03/27/2022 1541   MCH 36.8 (H) 09/07/2022 1814   MCHC 34.5 09/07/2022 1814   RDW 22.3 (H) 09/07/2022 1814   RDW 20.2 (H) 03/27/2022 1541   LYMPHSABS 8.3 (H) 09/07/2022  1814   LYMPHSABS 7.7 (H) 03/27/2022 1541   MONOABS 1.4 (H) 09/07/2022 1814   EOSABS 0.2 09/07/2022 1814   EOSABS 0.4 03/27/2022 1541   BASOSABS 0.1 09/07/2022 1814   BASOSABS 0.1 03/27/2022 1541    BMET    Component Value Date/Time   NA 138 09/07/2022 1814   NA 143 03/27/2022 1541   K 3.5 09/07/2022 1814   CL 106 09/07/2022 1814   CO2 24 09/07/2022 1814   GLUCOSE 96 09/07/2022 1814   BUN 5 (L) 09/07/2022 1814   BUN 6 03/27/2022 1541   CREATININE 0.37 (L) 09/07/2022 1814   CALCIUM 9.0 09/07/2022 1814   GFRNONAA >60 09/07/2022 1814   GFRAA >60 07/29/2020 0605    BNP No results found for: "BNP"  ProBNP No results found for: "PROBNP"  Imaging: No results found.   Assessment & Plan:   No problem-specific Assessment & Plan notes found for this encounter.     Fenton Foy, NP 12/07/2022

## 2022-12-13 ENCOUNTER — Other Ambulatory Visit: Payer: Self-pay | Admitting: Nurse Practitioner

## 2022-12-13 MED ORDER — VITAMIN D (ERGOCALCIFEROL) 1.25 MG (50000 UNIT) PO CAPS: 50000.0000 [IU] | ORAL_CAPSULE | ORAL | 0 refills | Status: DC

## 2022-12-14 ENCOUNTER — Encounter: Payer: Self-pay | Admitting: Nurse Practitioner

## 2022-12-14 ENCOUNTER — Other Ambulatory Visit (HOSPITAL_COMMUNITY): Payer: Self-pay

## 2022-12-14 LAB — CMP14+CBC/D/PLT+FER+RETIC+V...
ALT: 17 IU/L (ref 0–32)
AST: 54 IU/L — ABNORMAL HIGH (ref 0–40)
Albumin/Globulin Ratio: 1.4 (ref 1.2–2.2)
Albumin: 4.6 g/dL (ref 3.9–4.9)
Alkaline Phosphatase: 63 IU/L (ref 44–121)
BUN/Creatinine Ratio: 7 — ABNORMAL LOW (ref 9–23)
BUN: 4 mg/dL — ABNORMAL LOW (ref 6–20)
Basophils Absolute: 0.1 10*3/uL (ref 0.0–0.2)
Basos: 1 %
Bilirubin Total: 4.1 mg/dL — ABNORMAL HIGH (ref 0.0–1.2)
CO2: 16 mmol/L — ABNORMAL LOW (ref 20–29)
Calcium: 9.7 mg/dL (ref 8.7–10.2)
Chloride: 101 mmol/L (ref 96–106)
Creatinine, Ser: 0.59 mg/dL (ref 0.57–1.00)
EOS (ABSOLUTE): 0.2 10*3/uL (ref 0.0–0.4)
Eos: 1 %
Ferritin: 714 ng/mL — ABNORMAL HIGH (ref 15–150)
Globulin, Total: 3.4 g/dL (ref 1.5–4.5)
Glucose: 98 mg/dL (ref 70–99)
Hematocrit: 25.2 % — ABNORMAL LOW (ref 34.0–46.6)
Hemoglobin: 8.5 g/dL — ABNORMAL LOW (ref 11.1–15.9)
Immature Grans (Abs): 0.1 10*3/uL (ref 0.0–0.1)
Immature Granulocytes: 1 %
Lymphocytes Absolute: 7 10*3/uL — ABNORMAL HIGH (ref 0.7–3.1)
Lymphs: 51 %
MCH: 36 pg — ABNORMAL HIGH (ref 26.6–33.0)
MCHC: 33.7 g/dL (ref 31.5–35.7)
MCV: 107 fL — ABNORMAL HIGH (ref 79–97)
Monocytes Absolute: 1.1 10*3/uL — ABNORMAL HIGH (ref 0.1–0.9)
Monocytes: 8 %
NRBC: 4 % — ABNORMAL HIGH (ref 0–0)
Neutrophils Absolute: 5.1 10*3/uL (ref 1.4–7.0)
Neutrophils: 38 %
Platelets: 360 10*3/uL (ref 150–450)
Potassium: 4.6 mmol/L (ref 3.5–5.2)
RBC: 2.36 x10E6/uL — CL (ref 3.77–5.28)
RDW: 21.6 % — ABNORMAL HIGH (ref 11.7–15.4)
Retic Ct Pct: 25.5 % — ABNORMAL HIGH (ref 0.6–2.6)
Sodium: 137 mmol/L (ref 134–144)
Total Protein: 8 g/dL (ref 6.0–8.5)
Vit D, 25-Hydroxy: 9.2 ng/mL — ABNORMAL LOW (ref 30.0–100.0)
WBC: 13.5 10*3/uL — ABNORMAL HIGH (ref 3.4–10.8)
eGFR: 120 mL/min/{1.73_m2} (ref >59)

## 2022-12-14 LAB — DRUG SCREEN 11 W/CONF, SE

## 2022-12-14 NOTE — Telephone Encounter (Signed)
Spoke with patient and advised she go to the ED. She has agreed. She is aware we will get back to her in regards to pet letter.

## 2022-12-14 NOTE — Assessment & Plan Note (Signed)
-  Sickle Cell Panel - Drug Screen 11 w/Conf, Ser  2. Other chronic pain  - oxyCODONE (ROXICODONE) 15 MG immediate release tablet; Take 1 tablet (15 mg total) by mouth every 4 (four) hours as needed for pain.  Dispense: 90 tablet; Refill: 0  3. Sickle cell crisis (HCC)  - oxyCODONE (OXYCONTIN) 15 mg 12 hr tablet; Take 1 tablet (15 mg total) by mouth every 12 (twelve) hours.  Dispense: 30 tablet; Refill: 0  4. Uncomplicated opioid dependence (HCC)  - oxyCODONE (OXYCONTIN) 15 mg 12 hr tablet; Take 1 tablet (15 mg total) by mouth every 12 (twelve) hours.  Dispense: 30 tablet; Refill: 0   Follow up:  Follow up in 3 months

## 2022-12-14 NOTE — Patient Instructions (Addendum)
1. Sickle cell disease without crisis ([name])  - Sickle Cell Panel - Drug Screen 11 w/Conf, Ser  2. Other chronic pain  - oxyCODONE (ROXICODONE) 15 MG immediate release tablet; Take 1 tablet (15 mg total) by mouth every 4 (four) hours as needed for pain.  Dispense: 90 tablet; Refill: 0  3. Sickle cell crisis (HCC)  - oxyCODONE (OXYCONTIN) 15 mg 12 hr tablet; Take 1 tablet (15 mg total) by mouth every 12 (twelve) hours.  Dispense: 30 tablet; Refill: 0  4. Uncomplicated opioid dependence (HCC)  - oxyCODONE (OXYCONTIN) 15 mg 12 hr tablet; Take 1 tablet (15 mg total) by mouth every 12 (twelve) hours.  Dispense: 30 tablet; Refill: 0   Follow up:  Follow up in 3 months

## 2022-12-14 NOTE — Telephone Encounter (Signed)
Kim, please advise patient to go to the ED for further evaluation of leg pain to rule out blood clot. Thanks.  Manuela Schwartz, could you help with service animal?  Thanks

## 2022-12-14 NOTE — Telephone Encounter (Signed)
Left message for patient to return call to office at [phone].  

## 2022-12-15 NOTE — Telephone Encounter (Signed)
Pt called back and wanted a call back!

## 2022-12-15 NOTE — Telephone Encounter (Signed)
Left message for patient to return call to office at [phone].  

## 2022-12-15 NOTE — Telephone Encounter (Signed)
Patient did not got to the ER she said the pain stopped. Now only has her regular pain. She states that she should not have to go to psych for letter since all the evidence needed and past records are in her chart. She wants to here what Kenney Houseman has to say she is not happy with the being referred out.

## 2022-12-16 ENCOUNTER — Other Ambulatory Visit: Payer: Self-pay | Admitting: Nurse Practitioner

## 2022-12-16 DIAGNOSIS — G8929 Other chronic pain: Secondary | ICD-10-CM

## 2022-12-18 NOTE — Telephone Encounter (Signed)
Caller & Relationship to patient:  MRN #  [id number]   Call Back Number:   Date of Last Office Visit: 12/07/2022     Date of Next Office Visit: 03/08/2023    Medication(s) to be Refilled: oxycodone  Preferred Pharmacy:  [facility]  ** Please notify patient to allow 48-72 hours to process** **Let patient know to contact pharmacy at the end of the day to make sure medication is ready. ** **If patient has not been seen in a year or longer, book an appointment **Advise to use MyChart for refill requests OR to contact their pharmacy

## 2022-12-20 NOTE — Telephone Encounter (Signed)
Caller & Relationship to patient:  MRN #  [id number]   Call Back Number:   Date of Last Office Visit: 12/07/2022     Date of Next Office Visit: 03/08/2023    Medication(s) to be Refilled: Oxy &  VD to [place]  Preferred Pharmacy:   ** Please notify patient to allow 48-72 hours to process** **Let patient know to contact pharmacy at the end of the day to make sure medication is ready. ** **If patient has not been seen in a year or longer, book an appointment **Advise to use MyChart for refill requests OR to contact their pharmacy

## 2022-12-21 ENCOUNTER — Other Ambulatory Visit: Payer: Self-pay | Admitting: Family Medicine

## 2022-12-21 DIAGNOSIS — G8929 Other chronic pain: Secondary | ICD-10-CM

## 2022-12-21 MED ORDER — OXYCODONE HCL 15 MG PO TABS: 15.0000 mg | ORAL_TABLET | ORAL | 0 refills | Status: DC | PRN

## 2022-12-21 NOTE — Progress Notes (Signed)
Reviewed PDMP substance reporting system prior to prescribing opiate medications. No inconsistencies noted.  Meds ordered this encounter  Medications   oxyCODONE (ROXICODONE) 15 MG immediate release tablet    Sig: Take 1 tablet (15 mg total) by mouth every 4 (four) hours as needed for pain.    Dispense:  90 tablet    Refill:  0    Order Specific Question:   Supervising Provider    Answer:   JEGEDE, OLUGBEMIGA E [[id number]]   Ambry Dix Moore Delonda Coley  APRN, MSN, FNP-C [facility] [address]  [place], [place] [address] [phone]  

## 2022-12-22 ENCOUNTER — Other Ambulatory Visit (HOSPITAL_COMMUNITY): Payer: Self-pay

## 2022-12-22 ENCOUNTER — Other Ambulatory Visit: Payer: Self-pay | Admitting: Nurse Practitioner

## 2022-12-22 ENCOUNTER — Other Ambulatory Visit: Payer: Self-pay

## 2022-12-22 DIAGNOSIS — G8929 Other chronic pain: Secondary | ICD-10-CM

## 2022-12-22 DIAGNOSIS — D5700 Hb-SS disease with crisis, unspecified: Secondary | ICD-10-CM

## 2022-12-22 DIAGNOSIS — F1120 Opioid dependence, uncomplicated: Secondary | ICD-10-CM

## 2022-12-22 MED ORDER — OXYCODONE HCL ER 15 MG PO T12A: 15.0000 mg | EXTENDED_RELEASE_TABLET | Freq: Two times a day (BID) | ORAL | 0 refills | Status: DC

## 2022-12-22 MED ORDER — GABAPENTIN 300 MG PO CAPS
ORAL_CAPSULE | ORAL | 1 refills | Status: DC
  Filled 2022-12-22: qty 90, 30d supply, fill #0

## 2022-12-22 MED ORDER — VITAMIN D (ERGOCALCIFEROL) 1.25 MG (50000 UNIT) PO CAPS: 50000.0000 [IU] | ORAL_CAPSULE | ORAL | 0 refills | Status: AC

## 2022-12-25 ENCOUNTER — Other Ambulatory Visit: Payer: Self-pay | Admitting: Nurse Practitioner

## 2022-12-25 DIAGNOSIS — G8929 Other chronic pain: Secondary | ICD-10-CM

## 2022-12-26 NOTE — Telephone Encounter (Signed)
Caller & Relationship to patient:  MRN #  [id number]   Call Back Number:   Date of Last Office Visit: 12/22/2022     Date of Next Office Visit: 03/08/2023    Medication(s) to be Refilled:  see below  [facility]  ** Please notify patient to allow 48-72 hours to process** **Let patient know to contact pharmacy at the end of the day to make sure medication is ready. ** **If patient has not been seen in a year or longer, book an appointment **Advise to use MyChart for refill requests OR to contact their pharmacy

## 2022-12-28 ENCOUNTER — Other Ambulatory Visit: Payer: Self-pay

## 2022-12-28 DIAGNOSIS — G8929 Other chronic pain: Secondary | ICD-10-CM

## 2022-12-28 DIAGNOSIS — D571 Sickle-cell disease without crisis: Secondary | ICD-10-CM

## 2022-12-28 NOTE — Telephone Encounter (Signed)
Please advise KH 

## 2022-12-29 ENCOUNTER — Other Ambulatory Visit (HOSPITAL_COMMUNITY): Payer: Self-pay

## 2022-12-29 MED ORDER — NAPROXEN 500 MG PO TABS
500.0000 mg | ORAL_TABLET | Freq: Three times a day (TID) | ORAL | 3 refills | Status: AC | PRN
  Filled 2022-12-29: qty 90, 30d supply, fill #0

## 2023-01-04 ENCOUNTER — Other Ambulatory Visit: Payer: Self-pay

## 2023-01-04 DIAGNOSIS — D5700 Hb-SS disease with crisis, unspecified: Secondary | ICD-10-CM

## 2023-01-04 DIAGNOSIS — F1120 Opioid dependence, uncomplicated: Secondary | ICD-10-CM

## 2023-01-04 NOTE — Telephone Encounter (Signed)
From: [name] To: Office of Fenton Foy, NP Sent: 01/03/2023 11:12 PM EST Subject: Medication Renewal Request  Refills have been requested for the following medications:   oxyCODONE (OXYCONTIN) 15 mg 12 hr tablet [Tonya S Nichols]  Patient Comment: Due next week on the 20th  Preferred pharmacy: [facility] Delivery method: Mail   Medication renewals requested in this message routed separately:   oxyCODONE (ROXICODONE) 15 MG immediate release tablet [[name]]  Patient Comment: Due next week on the 20th

## 2023-01-04 NOTE — Telephone Encounter (Signed)
Please advise KH 

## 2023-01-05 ENCOUNTER — Other Ambulatory Visit (HOSPITAL_COMMUNITY): Payer: Self-pay

## 2023-01-06 ENCOUNTER — Other Ambulatory Visit (HOSPITAL_COMMUNITY): Payer: Self-pay

## 2023-01-09 ENCOUNTER — Other Ambulatory Visit (HOSPITAL_COMMUNITY): Payer: Self-pay

## 2023-01-09 NOTE — Telephone Encounter (Signed)
Called pt and went to vm. [place]

## 2023-01-09 NOTE — Telephone Encounter (Signed)
Please call this pt  She's very angry and needs to speak w/ someone in Clinic

## 2023-01-10 ENCOUNTER — Telehealth: Payer: Self-pay | Admitting: Nurse Practitioner

## 2023-01-10 ENCOUNTER — Other Ambulatory Visit: Payer: Self-pay | Admitting: Family Medicine

## 2023-01-10 DIAGNOSIS — G8929 Other chronic pain: Secondary | ICD-10-CM

## 2023-01-10 MED ORDER — GABAPENTIN 300 MG PO CAPS: ORAL_CAPSULE | ORAL | 3 refills | Status: AC

## 2023-01-10 MED ORDER — OXYCODONE HCL 15 MG PO TABS: 15.0000 mg | ORAL_TABLET | ORAL | 0 refills | Status: DC | PRN

## 2023-01-10 NOTE — Telephone Encounter (Signed)
Pt called requesting medicine be sent to her insurance so that it can be delivered to her because she is too weak to drive and get it today.   Pt also requesting a nurse give her a call.

## 2023-01-10 NOTE — Progress Notes (Signed)
Reviewed PDMP substance reporting system prior to prescribing opiate medications. No inconsistencies noted.  Meds ordered this encounter  Medications   oxyCODONE (ROXICODONE) 15 MG immediate release tablet    Sig: Take 1 tablet (15 mg total) by mouth every 4 (four) hours as needed for pain.    Dispense:  90 tablet    Refill:  0    Order Specific Question:   Supervising Provider    Answer:   Tresa Garter [id number]   gabapentin (NEURONTIN) 300 MG capsule    Sig: TAKE 1 CAPSULE BY MOUTH THREE TIMES A DAY    Dispense:  90 capsule    Refill:  3    DX Code Needed  .    Order Specific Question:   Supervising Provider    Answer:   Tresa Garter [id number]   Kara Pounds  APRN, MSN, FNP-C Patient [facility] [address] [place], [place] [address] [phone]

## 2023-01-11 ENCOUNTER — Other Ambulatory Visit: Payer: Self-pay | Admitting: Family Medicine

## 2023-01-11 ENCOUNTER — Other Ambulatory Visit: Payer: Self-pay | Admitting: Nurse Practitioner

## 2023-01-11 ENCOUNTER — Telehealth: Payer: Self-pay

## 2023-01-11 ENCOUNTER — Other Ambulatory Visit: Payer: Self-pay

## 2023-01-11 DIAGNOSIS — D5700 Hb-SS disease with crisis, unspecified: Secondary | ICD-10-CM

## 2023-01-11 DIAGNOSIS — F1120 Opioid dependence, uncomplicated: Secondary | ICD-10-CM

## 2023-01-11 MED ORDER — OXYCODONE HCL ER 15 MG PO T12A: 15.0000 mg | EXTENDED_RELEASE_TABLET | Freq: Two times a day (BID) | ORAL | 0 refills | Status: DC

## 2023-01-11 NOTE — Progress Notes (Signed)
Reviewed PDMP substance reporting system prior to prescribing opiate medications. No inconsistencies noted.  Meds ordered this encounter  Medications   oxyCODONE (OXYCONTIN) 15 mg 12 hr tablet    Sig: Take 1 tablet (15 mg total) by mouth every 12 (twelve) hours.    Dispense:  30 tablet    Refill:  0    Order Specific Question:   Supervising Provider    Answer:   Tresa Garter [id number]   Donia Pounds  APRN, MSN, FNP-C [facility] [address] [place], [place] [address] [phone] a

## 2023-01-11 NOTE — Telephone Encounter (Signed)
From: [name] To: Office of Fenton Foy, NP Sent: 01/10/2023 4:30 PM EST Subject: Medication Renewal Request  Refills have been requested for the following medications:   oxyCODONE (OXYCONTIN) 15 mg 12 hr tablet [Tonya S Nichols]  Patient Comment: I need this one done too asap being that you all went from every month to every two weeks too  [id number] [place] [address] Delivery method: [employer]

## 2023-01-11 NOTE — Telephone Encounter (Signed)
Please also send in this one. Thanks for everything you do.   Elyse Jarvis RMA

## 2023-01-15 ENCOUNTER — Ambulatory Visit (INDEPENDENT_AMBULATORY_CARE_PROVIDER_SITE_OTHER): Payer: Medicare Other | Admitting: Clinical

## 2023-01-15 DIAGNOSIS — F419 Anxiety disorder, unspecified: Secondary | ICD-10-CM

## 2023-01-15 DIAGNOSIS — F3289 Other specified depressive episodes: Secondary | ICD-10-CM

## 2023-01-15 NOTE — Telephone Encounter (Signed)
Medication have been sent in by [place] [facility]

## 2023-01-15 NOTE — BH Specialist Note (Unsigned)
Integrated Behavioral Health via Telemedicine Visit  01/15/2023 Shontel Beranek [id number]  Number of [place] Clinician visits: 1- Initial Visit  Session Start time: [id number]   Session End time: [id number]  Total time in minutes: 50   Referring Provider: Lazaro Arms, NP Patient/Family location: home [facility] Provider location: Patient [place] All persons participating in visit: CSW, patient Types of Service: Individual psychotherapy and Video visit  I connected with [name] via Video Enabled Telemedicine Application  (Video is Caregility application) and verified that I am speaking with the correct person using two identifiers. Discussed confidentiality: Yes   I discussed the limitations of telemedicine and the availability of in person appointments.  Discussed there is a possibility of technology failure and discussed alternative modes of communication if that failure occurs.  I discussed that engaging in this telemedicine visit, they consent to the provision of behavioral healthcare and the services will be billed under their insurance.  Patient and/or legal guardian expressed understanding and consented to Telemedicine visit: Yes   Presenting Concerns: Patient and/or family reports the following symptoms/concerns: depression, anxiety Duration of problem: several years; Severity of problem: mild  Patient and/or Family's Strengths/Protective Factors: Social connections and Concrete supports in place (healthy food, safe environments, etc.)  Goals Addressed: Patient will:  Reduce symptoms of: anxiety and depression   Increase knowledge and/or ability of: coping skills and self-management skills   Demonstrate ability to: Increase healthy adjustment to current life circumstances  Progress towards Goals: Ongoing  Interventions: Interventions utilized:  Supportive Counseling Standardized Assessments completed: Not Needed  Patient was referred  for assistance with navigating health insurance and medication refill issues. Patient is also interested in an emotional support animal (ESA). Assessment and supportive counseling today. Will coordinate with PCP on medication refill issues. Patient reports she manages her anxiety and depression fairly well. She experiences loneliness due to being home most of the time, which contributes to her wanting an ESA. She reports managing her sickle cell pain fairly well.  Patient and/or Family Response: Patient engaged in session.   Assessment: Patient currently experiencing anxiety and depression, as well as chronic illness.   Patient may benefit from CBT to explore unhelpful thoughts that may exacerbate anxiety and depression. Patient may also benefit from developing some additional coping skills for living with chronic illness.  Plan: Follow up with behavioral health clinician on: 02/15/23 Referral(s): [place] (In Clinic)  I discussed the assessment and treatment plan with the patient and/or parent/guardian. They were provided an opportunity to ask questions and all were answered. They agreed with the plan and demonstrated an understanding of the instructions.   They were advised to call back or seek an in-person evaluation if the symptoms worsen or if the condition fails to improve as anticipated.  [name], LCSW

## 2023-01-18 ENCOUNTER — Other Ambulatory Visit: Payer: Self-pay | Admitting: Family Medicine

## 2023-01-22 ENCOUNTER — Other Ambulatory Visit: Payer: Self-pay

## 2023-01-22 DIAGNOSIS — F1120 Opioid dependence, uncomplicated: Secondary | ICD-10-CM

## 2023-01-22 DIAGNOSIS — G8929 Other chronic pain: Secondary | ICD-10-CM

## 2023-01-22 DIAGNOSIS — D5700 Hb-SS disease with crisis, unspecified: Secondary | ICD-10-CM

## 2023-01-22 NOTE — Telephone Encounter (Signed)
I know you just sent this in. Please advise .  Pt would like to change back to you.

## 2023-01-22 NOTE — Telephone Encounter (Signed)
From: [name] To: Office of [place], [place] Sent: 01/22/2023 11:22 AM EST Subject: Medication Renewal Request  Refills have been requested for the following medications:   oxyCODONE (ROXICODONE) 15 MG immediate release tablet [Kara [name]]  Patient Comment: Both due Friday the eighth    oxyCODONE (OXYCONTIN) 15 mg 12 hr tablet [Kara [name]]  Patient Comment: Both due Friday the eighth   [id number] [place] [address] Delivery method: [employer]

## 2023-01-23 ENCOUNTER — Other Ambulatory Visit: Payer: Self-pay | Admitting: Family Medicine

## 2023-01-23 DIAGNOSIS — D5700 Hb-SS disease with crisis, unspecified: Secondary | ICD-10-CM

## 2023-01-23 DIAGNOSIS — G8929 Other chronic pain: Secondary | ICD-10-CM

## 2023-01-23 DIAGNOSIS — F1120 Opioid dependence, uncomplicated: Secondary | ICD-10-CM

## 2023-01-23 MED ORDER — OXYCODONE HCL ER 15 MG PO T12A: 15.0000 mg | EXTENDED_RELEASE_TABLET | Freq: Two times a day (BID) | ORAL | 0 refills | Status: DC

## 2023-01-23 MED ORDER — OXYCODONE HCL 15 MG PO TABS: 15.0000 mg | ORAL_TABLET | ORAL | 0 refills | Status: DC | PRN

## 2023-01-23 NOTE — Progress Notes (Signed)
Reviewed PDMP substance reporting system prior to prescribing opiate medications. No inconsistencies noted.  Meds ordered this encounter  Medications   oxyCODONE (ROXICODONE) 15 MG immediate release tablet    Sig: Take 1 tablet (15 mg total) by mouth every 4 (four) hours as needed for pain.    Dispense:  90 tablet    Refill:  0    Order Specific Question:   Supervising Provider    Answer:   Tresa Garter [id number]   oxyCODONE (OXYCONTIN) 15 mg 12 hr tablet    Sig: Take 1 tablet (15 mg total) by mouth every 12 (twelve) hours.    Dispense:  30 tablet    Refill:  0    Order Specific Question:   Supervising Provider    Answer:   Tresa Garter [id number]   Kara Pounds  APRN, MSN, FNP-C Patient [facility] [address] [place], [name] [address] [phone]

## 2023-01-24 ENCOUNTER — Telehealth: Payer: Self-pay

## 2023-01-25 ENCOUNTER — Other Ambulatory Visit: Payer: Self-pay | Admitting: Nurse Practitioner

## 2023-01-26 ENCOUNTER — Other Ambulatory Visit: Payer: Self-pay | Admitting: Nurse Practitioner

## 2023-01-31 NOTE — Telephone Encounter (Signed)
Per provider pt medication sent to pt pharmacy. [place]

## 2023-02-06 ENCOUNTER — Ambulatory Visit: Payer: Self-pay | Admitting: Family Medicine

## 2023-02-06 ENCOUNTER — Other Ambulatory Visit: Payer: Self-pay

## 2023-02-06 DIAGNOSIS — F1120 Opioid dependence, uncomplicated: Secondary | ICD-10-CM

## 2023-02-06 DIAGNOSIS — D5700 Hb-SS disease with crisis, unspecified: Secondary | ICD-10-CM

## 2023-02-06 DIAGNOSIS — G8929 Other chronic pain: Secondary | ICD-10-CM

## 2023-02-06 NOTE — Telephone Encounter (Signed)
From: [name] To: Office of [place], [place] Sent: 02/06/2023 2:01 PM EDT Subject: Medication Renewal Request  Refills have been requested for the following medications:   oxyCODONE (ROXICODONE) 15 MG immediate release tablet [[name]]  Patient Comment: Due by this Saturday    oxyCODONE (OXYCONTIN) 15 mg 12 hr tablet [[name]]  Patient Comment: Due by this Saturday   [id number] [place] - [address] Delivery method: [employer]

## 2023-02-06 NOTE — Telephone Encounter (Signed)
Please advise KH 

## 2023-02-07 MED ORDER — OXYCODONE HCL 15 MG PO TABS: 15.0000 mg | ORAL_TABLET | ORAL | 0 refills | Status: DC | PRN

## 2023-02-07 MED ORDER — OXYCODONE HCL ER 15 MG PO T12A: 15.0000 mg | EXTENDED_RELEASE_TABLET | Freq: Two times a day (BID) | ORAL | 0 refills | Status: DC

## 2023-02-15 ENCOUNTER — Ambulatory Visit (INDEPENDENT_AMBULATORY_CARE_PROVIDER_SITE_OTHER): Payer: Medicare Other | Admitting: Clinical

## 2023-02-15 DIAGNOSIS — F419 Anxiety disorder, unspecified: Secondary | ICD-10-CM

## 2023-02-15 DIAGNOSIS — F3289 Other specified depressive episodes: Secondary | ICD-10-CM

## 2023-02-15 NOTE — BH Specialist Note (Signed)
Integrated Behavioral Health via Telemedicine Visit  02/15/2023 Kara [name] [id number]  Number of Integrated Behavioral Health Clinician visits: 2- Second Visit  Session Start time: [id number]   Session End time: 1600  Total time in minutes: 55  Referring Provider: Lazaro Arms, NP Patient/Family location: home [facility] Provider location: Patient [place] All persons participating in visit: CSW, patient Types of Service: Individual psychotherapy and Video visit  I connected with Kara [name] via Video Enabled Telemedicine Application  (Video is Caregility application) and verified that I am speaking with the correct person using two identifiers. Discussed confidentiality: Yes   I discussed the limitations of telemedicine and the availability of in person appointments.  Discussed there is a possibility of technology failure and discussed alternative modes of communication if that failure occurs.  I discussed that engaging in this telemedicine visit, they consent to the provision of behavioral healthcare and the services will be billed under their insurance.  Patient and/or legal guardian expressed understanding and consented to Telemedicine visit: Yes   Presenting Concerns: Patient and/or family reports the following symptoms/concerns: depression, anxiety Duration of problem: several years; Severity of problem: mild  Patient and/or Family's Strengths/Protective Factors: Social connections and Concrete supports in place (healthy food, safe environments, etc.)   Goals Addressed: Patient will:   Reduce symptoms of: anxiety and depression   Increase knowledge and/or ability of: coping skills and self-management skills   Demonstrate ability to: Increase healthy adjustment to current life circumstances  Progress towards Goals: Ongoing  Interventions: Interventions utilized:  Mindfulness or Psychologist, educational, CBT Cognitive Behavioral Therapy, and Supportive  Counseling Standardized Assessments completed: Not Needed  Patient reported she received mail indicating that her Medicaid coverage was ending due to not providing information to DSS. CSW called [place] DSS with patient and left voice mail. Advised patient she will likely need to go to the [place] office in person to address this, as she had already placed a call and has not received a call back.   Supportive counseling today around feelings of loneliness and not being able to see family. Patient feels limited in ability to travel to see family, as she needs to pick up her medication every two weeks. Most of her family is spread out across states. Discussed other ways of building community and bolstering social support, including sickle cell support group.   Patient has been experiencing nightmares lately, hasn't been able to sleep well. Her best friend's mother died around the same time this started happening. Supportive counseling and some brief CBT. Patient appears to be experiencing heightened anxiety. Introduced and attempted to practice mindful breathing exercise for grounding, but patient had difficulty due to nasal congestion.  Patient and/or Family Response: Patient engaged in session.   Assessment: Patient currently experiencing anxiety and depression, as well as chronic illness.    Patient may benefit from CBT to explore unhelpful thoughts that may exacerbate anxiety and depression. Patient may also benefit from developing some additional coping skills for living with chronic illness.  Plan: Follow up with behavioral health clinician on: 03/01/23 Behavioral recommendations: journaling  I discussed the assessment and treatment plan with the patient and/or parent/guardian. They were provided an opportunity to ask questions and all were answered. They agreed with the plan and demonstrated an understanding of the instructions.   They were advised to call back or seek an in-person evaluation  if the symptoms worsen or if the condition fails to improve as anticipated.  Kara Emms, LCSW

## 2023-02-28 ENCOUNTER — Other Ambulatory Visit: Payer: Self-pay | Admitting: Nurse Practitioner

## 2023-02-28 DIAGNOSIS — F1120 Opioid dependence, uncomplicated: Secondary | ICD-10-CM

## 2023-02-28 DIAGNOSIS — D5700 Hb-SS disease with crisis, unspecified: Secondary | ICD-10-CM

## 2023-02-28 DIAGNOSIS — G8929 Other chronic pain: Secondary | ICD-10-CM

## 2023-02-28 MED ORDER — OXYCODONE HCL ER 15 MG PO T12A
15.0000 mg | EXTENDED_RELEASE_TABLET | Freq: Two times a day (BID) | ORAL | 0 refills | Status: AC
Start: 2023-02-28 — End: ?

## 2023-02-28 MED ORDER — OXYCODONE HCL 15 MG PO TABS
15.0000 mg | ORAL_TABLET | ORAL | 0 refills | Status: AC | PRN
Start: 2023-02-28 — End: ?

## 2023-03-01 ENCOUNTER — Encounter: Payer: Medicare Other | Admitting: Clinical

## 2023-03-08 ENCOUNTER — Ambulatory Visit: Payer: Medicare Other | Admitting: Nurse Practitioner

## 2023-03-19 NOTE — Telephone Encounter (Signed)
Caller & Relationship to patient:  MRN #  [id number]   Call Back Number:   Date of Last Office Visit: 03/08/2023     Date of Next Office Visit: 03/21/2023    Medication(s) to be Refilled: Oxycodone, Oxy Contin  Preferred Pharmacy:   ** Please notify patient to allow 48-72 hours to process** **Let patient know to contact pharmacy at the end of the day to make sure medication is ready. ** **If patient has not been seen in a year or longer, book an appointment **Advise to use MyChart for refill requests OR to contact their pharmacy

## 2023-03-20 ENCOUNTER — Other Ambulatory Visit: Payer: Self-pay

## 2023-03-20 DIAGNOSIS — D5700 Hb-SS disease with crisis, unspecified: Secondary | ICD-10-CM

## 2023-03-20 DIAGNOSIS — F1120 Opioid dependence, uncomplicated: Secondary | ICD-10-CM

## 2023-03-20 DIAGNOSIS — G8929 Other chronic pain: Secondary | ICD-10-CM

## 2023-03-20 NOTE — Telephone Encounter (Signed)
Please advise KH 

## 2023-03-20 NOTE — Telephone Encounter (Signed)
From: [name] To: Office of Ivonne Andrew, NP Sent: 03/20/2023 9:43 AM EDT Subject: Medication Renewal Request  Refills have been requested for the following medications:   oxyCODONE (ROXICODONE) 15 MG immediate release tablet Kara [name]]  Patient Comment: This is due and I don't understand why I have to continue going through this with you guys    oxyCODONE (OXYCONTIN) 15 mg 12 hr tablet Kara [name]]  Patient Comment: This is due and I don't understand why I have to go through this with you guys every time   Preferred pharmacy: [id number] - [place], [place] - [address] Delivery method: [employer]

## 2023-03-21 ENCOUNTER — Other Ambulatory Visit: Payer: Self-pay | Admitting: Nurse Practitioner

## 2023-03-21 ENCOUNTER — Inpatient Hospital Stay: Payer: Medicare Other | Admitting: Nurse Practitioner

## 2023-04-04 ENCOUNTER — Telehealth: Payer: Self-pay | Admitting: Nurse Practitioner

## 2023-04-04 NOTE — Telephone Encounter (Signed)
Contacted Kara [name] to schedule their annual wellness visit. Appointment made for 04/19/2023.  Thank you,  Judeth Cornfield,  AMB Clinical Support [facility] AWV Program Direct Dial ??[phone]

## 2024-02-28 ENCOUNTER — Telehealth: Payer: Self-pay

## 2024-02-28 NOTE — Telephone Encounter (Signed)
 Sickle cell pt needs an appoint with Archie Patten if she is still her PCP. Kh
